# Patient Record
Sex: Male | Born: 1940 | Race: Black or African American | Hispanic: No | Marital: Married | Smoking: Former smoker
Health system: Southern US, Community
[De-identification: ages and names within clinical notes are randomized; demographics above are authoritative.]

## PROBLEM LIST (undated history)

## (undated) ENCOUNTER — Emergency Department (HOSPITAL_COMMUNITY): Disposition: A | Discharge status: Home / Self Care | Payer: Medicare HMO

## (undated) ENCOUNTER — Emergency Department (HOSPITAL_COMMUNITY): Admission: EM | Discharge status: Absent without leave | Payer: Medicare HMO

## (undated) DIAGNOSIS — I10 Essential (primary) hypertension: Secondary | ICD-10-CM

## (undated) DIAGNOSIS — K5792 Diverticulitis of intestine, part unspecified, without perforation or abscess without bleeding: Secondary | ICD-10-CM

## (undated) DIAGNOSIS — J449 Chronic obstructive pulmonary disease, unspecified: Secondary | ICD-10-CM

## (undated) DIAGNOSIS — R296 Repeated falls: Secondary | ICD-10-CM

## (undated) DIAGNOSIS — C801 Malignant (primary) neoplasm, unspecified: Secondary | ICD-10-CM

## (undated) DIAGNOSIS — M1990 Unspecified osteoarthritis, unspecified site: Secondary | ICD-10-CM

## (undated) HISTORY — PX: CERVICAL FUSION: SHX112

## (undated) HISTORY — PX: JOINT REPLACEMENT: SHX530

## (undated) HISTORY — PX: OTHER SURGICAL HISTORY: SHX169

---

## 2004-06-14 ENCOUNTER — Ambulatory Visit (HOSPITAL_COMMUNITY): Admission: RE | Admit: 2004-06-14 | Discharge: 2004-06-14 | Payer: Self-pay | Admitting: Family Medicine

## 2004-06-22 ENCOUNTER — Ambulatory Visit: Payer: Self-pay | Admitting: *Deleted

## 2004-06-22 ENCOUNTER — Ambulatory Visit (HOSPITAL_COMMUNITY): Admission: RE | Admit: 2004-06-22 | Discharge: 2004-06-22 | Payer: Self-pay | Admitting: Family Medicine

## 2004-09-05 ENCOUNTER — Ambulatory Visit (HOSPITAL_COMMUNITY): Admission: RE | Admit: 2004-09-05 | Discharge: 2004-09-05 | Payer: Self-pay | Admitting: Family Medicine

## 2004-09-19 ENCOUNTER — Ambulatory Visit (HOSPITAL_COMMUNITY): Admission: RE | Admit: 2004-09-19 | Discharge: 2004-09-19 | Payer: Self-pay | Admitting: Family Medicine

## 2007-12-25 ENCOUNTER — Inpatient Hospital Stay (HOSPITAL_COMMUNITY): Admission: RE | Admit: 2007-12-25 | Discharge: 2007-12-27 | Payer: Self-pay | Admitting: Neurosurgery

## 2008-05-26 ENCOUNTER — Ambulatory Visit (HOSPITAL_COMMUNITY): Admission: RE | Admit: 2008-05-26 | Discharge: 2008-05-26 | Payer: Self-pay | Admitting: Urology

## 2008-06-03 ENCOUNTER — Ambulatory Visit: Admission: RE | Admit: 2008-06-03 | Discharge: 2008-09-01 | Payer: Self-pay | Admitting: Radiation Oncology

## 2009-10-31 ENCOUNTER — Inpatient Hospital Stay (HOSPITAL_COMMUNITY): Admission: RE | Admit: 2009-10-31 | Discharge: 2009-11-02 | Payer: Self-pay | Admitting: Orthopedic Surgery

## 2010-06-08 LAB — CBC
HCT: 35.1 % — ABNORMAL LOW (ref 39.0–52.0)
MCH: 31.2 pg (ref 26.0–34.0)
MCH: 31.2 pg (ref 26.0–34.0)
MCHC: 33.8 g/dL (ref 30.0–36.0)
MCV: 92.3 fL (ref 78.0–100.0)
MCV: 92.3 fL (ref 78.0–100.0)
Platelets: 189 10*3/uL (ref 150–400)
RBC: 3.81 MIL/uL — ABNORMAL LOW (ref 4.22–5.81)
RDW: 14.6 % (ref 11.5–15.5)

## 2010-06-08 LAB — BASIC METABOLIC PANEL
BUN: 9 mg/dL (ref 6–23)
CO2: 28 mEq/L (ref 19–32)
Calcium: 8.5 mg/dL (ref 8.4–10.5)
Chloride: 104 mEq/L (ref 96–112)
Chloride: 105 mEq/L (ref 96–112)
Glucose, Bld: 123 mg/dL — ABNORMAL HIGH (ref 70–99)
Glucose, Bld: 127 mg/dL — ABNORMAL HIGH (ref 70–99)
Potassium: 3.8 mEq/L (ref 3.5–5.1)
Sodium: 137 mEq/L (ref 135–145)

## 2010-06-08 LAB — TYPE AND SCREEN
ABO/RH(D): B POS
Antibody Screen: NEGATIVE

## 2010-06-09 LAB — DIFFERENTIAL
Eosinophils Absolute: 0.3 10*3/uL (ref 0.0–0.7)
Lymphs Abs: 0.8 10*3/uL (ref 0.7–4.0)
Monocytes Relative: 11 % (ref 3–12)
Neutro Abs: 2.9 10*3/uL (ref 1.7–7.7)
Neutrophils Relative %: 64 % (ref 43–77)

## 2010-06-09 LAB — BASIC METABOLIC PANEL
BUN: 11 mg/dL (ref 6–23)
CO2: 27 mEq/L (ref 19–32)
Calcium: 8.9 mg/dL (ref 8.4–10.5)
Chloride: 108 mEq/L (ref 96–112)
Glucose, Bld: 97 mg/dL (ref 70–99)

## 2010-06-09 LAB — CBC
MCHC: 33.8 g/dL (ref 30.0–36.0)
Platelets: 202 10*3/uL (ref 150–400)
RDW: 14.4 % (ref 11.5–15.5)

## 2010-06-09 LAB — URINALYSIS, ROUTINE W REFLEX MICROSCOPIC
Bilirubin Urine: NEGATIVE
Glucose, UA: NEGATIVE mg/dL
Hgb urine dipstick: NEGATIVE
Urobilinogen, UA: 0.2 mg/dL (ref 0.0–1.0)
pH: 5.5 (ref 5.0–8.0)

## 2010-06-09 LAB — PROTIME-INR: INR: 1.07 (ref 0.00–1.49)

## 2010-06-09 LAB — SURGICAL PCR SCREEN: MRSA, PCR: NEGATIVE

## 2010-08-07 NOTE — Discharge Summary (Signed)
[name], [name]               ACCOUNT NO.:  [id number]   MEDICAL RECORD NO.:  [id number]          PATIENT TYPE:  INP   LOCATION:  3016                         FACILITY:  [facility]   PHYSICIAN:  Coletta Memos, M.D.     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   PROCEDURES:  C4 corpectomy, C3-C5 arthrodesis, cervical decompression  arthrodesis C5-C6, anterior cervical plating C3 through C6.   COMPLICATIONS:  None.   DISCHARGE STATUS:  Alive and well.  Randy Russo is doing quite well  after his operation.  He is swallowing, tolerating a regular diet.  He  has voided and he has walked.  Wound is clean, dry, no signs of  infection.  He will be discharged home with Percocet and Flexeril.  Given instructions, no heavy bending, lifting, or twisting.           ______________________________  Coletta Memos, M.D.     KC/MEDQ  D:  12/27/2007  T:  12/28/2007  Job:  [id number]

## 2010-08-07 NOTE — Op Note (Signed)
Randy Russo NO.:  [id number]   MEDICAL RECORD NO.:  [id number]          PATIENT TYPE:  INP   LOCATION:  3016                         FACILITY:  [facility]   PHYSICIAN:  Danae Orleans. Venetia Maxon, M.D.  DATE OF BIRTH:  1941-01-30   DATE OF PROCEDURE:  12/25/2007  DATE OF DISCHARGE:                               OPERATIVE REPORT   PREOPERATIVE DIAGNOSES:  Herniated cervical disk with cervical stenosis,  spondylosis with myelopathy, and cervical radiculopathy, C3-4, C4-5 and  C5-6 levels.   POSTOPERATIVE DIAGNOSES:  Herniated cervical disk with cervical  stenosis, spondylosis with myelopathy, and cervical radiculopathy, C3-4,  C4-5 and C5-6 levels.   PROCEDURES:  1. Anterior cervical corpectomy, C4.  2. C3 through C5 PEEK cage with morcellized bone autograft.  3. Anterior cervical decompression and fusion C5-6 with autograft, and      allograft.  4. Anterior cervical plating C3 through C6 levels.   SURGEON:  Danae Orleans. Venetia Maxon, MD   ASSISTANT:  Georgiann Cocker, RN   ANESTHESIA:  General endotracheal anesthesia.   ESTIMATED BLOOD LOSS:  Minimal.   COMPLICATIONS:  None.   DISPOSITION:  Recovery.   INDICATIONS:  Randy Russo is a 70 year old man with a severe cervical  spondylitic myelopathy with marked cord compression at C3-4 and C4-5 and  a lesser degree at C5-6 level.  He had cord signal at C3-4 and C4-5 with  disk herniation extending from C3 to the C5 level.  It was elected to  take him to surgery for anterior cervical decompression and fusion at C3  through C6 levels.   PROCEDURE:  Randy Russo was brought to the operating room.  After smooth  and uncomplicated induction of general endotracheal anesthesia, he was  placed in the supine position on the operating table.  His neck was  maintained in neutral alignment.  His anterior neck was then prepped and  draped in usual sterile fashion.  After being placed in 5 pounds Halter  traction his anterior neck, an  incision was made on the left side of  midline after infiltrating the skin with local lidocaine, and carried  through to the platysma layer to expose the anterior border of  sternocleidomastoid muscle.  Using blunt dissection, the carotid sheath  was kept lateral and trachea and esophagus kept medial, and extremely  glabrous neck with quite redundant soft tissues, the C3-4 and C4-5  levels were exposed and final spinal needles were placed at each of  these levels, and the correct level was confirmed on intraoperative x-  ray.  Extremely large anterior osteophyte was removed at C5-6 and with  exposure of the interspace at this level.  Initially a Shadow-Line self-  retaining retractor was placed along with up and down retractors.  The  corpectomy at C4 was performed with a Leksell rongeur and completed with  high-speed drill, and endplates of C3 and C4 were prepared for grafting.  Under the microscope using 1- and 2-mm gold-tipped Kerrison rongeurs,  the posterior longitudinal ligament was removed along with thin  remaining lip of cortical bone to decompress central spinal cord dura.  There was a large amount of herniated disk material at C3-4, which was  inferiorly migrated and also redundant ligamentous tissue, and this was  all decompressed with significant decompression of the spinal cord and  dura.  Hemostasis was assured and after trial sizing, a 24-mm PEEK  interbody cage was selected and packed with morcellized bone autograft,  which was saved from the corpectomy defect and placed within the  interspace and countersunk appropriately.  A C5-6 disk space was further  explored.  Distraction pins were placed at C5 and C6 and under high-  power microscopic visualization, the endplates were decorticated, and  the interspace was identified and then carried to the dura, and the disk  space was extremely degenerated.  The spinal cord dura was decompressed  as were both C6 nerve roots.  After  trial sizing, a 7-mm allograft bone  wedge selected and fashioned with a high-speed drill and morcellized  bone autograft inserted into the interspace and countersunk  appropriately.  After removing remaining ventral osteophytes, a 54-mm  Trestle anterior cervical plate was affixed to the anterior cervical  spine using variable angle screws at C6 and C5 and fixed angle screws at  C3.  All screws had excellent purchase and their locking mechanisms were  engaged.  Final radiograph demonstrated well-positioned interbody grafts  and anterior cervical plating.  Because the patient's extremely large  body habitus, it was only possible to visualize the superior aspect of  the graft and screws.  Traction weight was removed prior to placing the  plate.  Wound was irrigated.  There was still some oozing from soft  tissues.  It was controlled with bipolar electrocautery and because of  the patient's large body size as well as the extensive nature of the  surgery, a #7 JP drain was inserted through a separate stab incision.  The platysma layer was closed with 3-0 Vicryl sutures and skin edges  were approximated with 3-0 Vicryl interrupted inverted sutures.  The  wound was dressed with benzoin, Steri-Strips, Telfa gauze, and tape.  The patient was extubated in the operating room and taken to recovery in  stable and satisfactory condition having tolerated the procedure well.  Counts were correct at the end of the case.      Danae Orleans. Venetia Maxon, M.D.  Electronically Signed     JDS/MEDQ  D:  12/25/2007  T:  12/26/2007  Job:  [id number]

## 2010-08-10 NOTE — Procedures (Signed)
NAME:  Randy Russo, Randy Russo NO.:  [id number]   MEDICAL RECORD NO.:  [id number]          PATIENT TYPE:  OUT   LOCATION:  RAD                           FACILITY:  [facility]   PHYSICIAN:  Vida Roller, M.D.   DATE OF BIRTH:  14-Aug-1940   DATE OF PROCEDURE:  06/22/2004  DATE OF DISCHARGE:                                  ECHOCARDIOGRAM   PRIMARY CARE PHYSICIAN:  Corrie Mckusick, M.D.   TAPE:  [id number].   TAPE COUNT:  550 through 998.   This is a 70 year old man with cardiomegaly.   The technical quality of the study is poor.   M-MODE TRACINGS:  The aorta is 36 mm.   Left atrium is 40 mm.   The septum is 19 mm.   The posterior wall is 14 mm.   Left ventricular diastolic dimension is 38 mm.   Left ventricular systolic dimension is 31 mm.   2D AND DOPPLER IMAGING:  The left ventricle is normal size with normal  systolic function.  The estimated ejection fraction is 55-60%.  There is  moderate concentric left ventricular hypertrophy with some upper septal  hypertrophy but no obvious left ventricular outflow gradient.   ASSESSMENT:  Wall motion is difficult, but there does not appear to be any  substantial regional wall motion abnormalities.   The right ventricle and right atrium were not well seen.  Unfortunately,  little clinical information can be gleaned from this.   There is no pericardial effusion.   There is no aortic stenosis.   There is no mitral regurgitation.      JH/MEDQ  D:  06/22/2004  T:  06/22/2004  Job:  [id number]

## 2010-08-10 NOTE — Procedures (Signed)
NAME:  Randy Russo, Randy Russo NO.:  [id number]   MEDICAL RECORD NO.:  [id number]          PATIENT TYPE:  OUT   LOCATION:                                FACILITY:  [facility]   PHYSICIAN:  Edward L. Juanetta Gosling, M.D.DATE OF BIRTH:  Aug 31, 1940   DATE OF PROCEDURE:  09/26/2004  DATE OF DISCHARGE:                              PULMONARY FUNCTION TEST   RESULTS:  1.  Spirometry shows a moderate ventilatory defect with evidence of airflow      obstruction.  2.  Lung volumes show a mild reduction in total lung capacity suggesting a      separate restrictive change.  3.  DLCO was mildly reduced.  4.  Arterial blood gases are normal.  5.  There is significant bronchodilator improvement.       ELH/MEDQ  D:  09/26/2004  T:  09/26/2004  Job:  [id number]

## 2010-09-13 ENCOUNTER — Encounter (HOSPITAL_COMMUNITY): Payer: Medicare Other

## 2010-09-13 ENCOUNTER — Other Ambulatory Visit: Payer: Self-pay | Admitting: Orthopedic Surgery

## 2010-09-13 ENCOUNTER — Other Ambulatory Visit (HOSPITAL_COMMUNITY): Payer: Self-pay | Admitting: Orthopedic Surgery

## 2010-09-13 ENCOUNTER — Ambulatory Visit (HOSPITAL_COMMUNITY)
Admission: RE | Admit: 2010-09-13 | Discharge: 2010-09-13 | Disposition: A | Discharge status: Home / Self Care | Payer: Medicare Other | Source: Ambulatory Visit | Attending: Orthopedic Surgery | Admitting: Orthopedic Surgery

## 2010-09-13 DIAGNOSIS — M1712 Unilateral primary osteoarthritis, left knee: Secondary | ICD-10-CM

## 2010-09-13 DIAGNOSIS — J4489 Other specified chronic obstructive pulmonary disease: Secondary | ICD-10-CM | POA: Insufficient documentation

## 2010-09-13 DIAGNOSIS — Z01812 Encounter for preprocedural laboratory examination: Secondary | ICD-10-CM | POA: Insufficient documentation

## 2010-09-13 DIAGNOSIS — Z981 Arthrodesis status: Secondary | ICD-10-CM | POA: Insufficient documentation

## 2010-09-13 DIAGNOSIS — M4120 Other idiopathic scoliosis, site unspecified: Secondary | ICD-10-CM | POA: Insufficient documentation

## 2010-09-13 DIAGNOSIS — M1710 Unilateral primary osteoarthritis, unspecified knee: Secondary | ICD-10-CM | POA: Insufficient documentation

## 2010-09-13 DIAGNOSIS — Z01818 Encounter for other preprocedural examination: Secondary | ICD-10-CM | POA: Insufficient documentation

## 2010-09-13 DIAGNOSIS — J449 Chronic obstructive pulmonary disease, unspecified: Secondary | ICD-10-CM | POA: Insufficient documentation

## 2010-09-13 LAB — URINALYSIS, ROUTINE W REFLEX MICROSCOPIC
Glucose, UA: NEGATIVE mg/dL
Leukocytes, UA: NEGATIVE
Protein, ur: NEGATIVE mg/dL
Specific Gravity, Urine: 1.022 (ref 1.005–1.030)
pH: 5.5 (ref 5.0–8.0)

## 2010-09-13 LAB — BASIC METABOLIC PANEL
BUN: 12 mg/dL (ref 6–23)
GFR calc Af Amer: >60 mL/min (ref >60)
GFR calc non Af Amer: >60 mL/min (ref >60)
Potassium: 4.2 mEq/L (ref 3.5–5.1)
Sodium: 138 mEq/L (ref 135–145)

## 2010-09-13 LAB — CBC
MCV: 91.4 fL (ref 78.0–100.0)
Platelets: 217 10*3/uL (ref 150–400)
RBC: 4.55 MIL/uL (ref 4.22–5.81)
RDW: 14 % (ref 11.5–15.5)
WBC: 5.7 10*3/uL (ref 4.0–10.5)

## 2010-09-13 LAB — DIFFERENTIAL
Basophils Absolute: 0 10*3/uL (ref 0.0–0.1)
Lymphocytes Relative: 20 % (ref 12–46)
Neutro Abs: 3.6 10*3/uL (ref 1.7–7.7)
Neutrophils Relative %: 64 % (ref 43–77)

## 2010-09-13 LAB — APTT: aPTT: 35 seconds (ref 24–37)

## 2010-09-13 LAB — PROTIME-INR: INR: 1.04 (ref 0.00–1.49)

## 2010-09-13 LAB — SURGICAL PCR SCREEN
MRSA, PCR: NEGATIVE
Staphylococcus aureus: NEGATIVE

## 2010-09-24 ENCOUNTER — Inpatient Hospital Stay (HOSPITAL_COMMUNITY)
Admission: RE | Admit: 2010-09-24 | Discharge: 2010-09-26 | DRG: 470 | Disposition: A | Discharge status: Home Health | Payer: Medicare Other | Source: Ambulatory Visit | Attending: Orthopedic Surgery | Admitting: Orthopedic Surgery

## 2010-09-24 DIAGNOSIS — Z01812 Encounter for preprocedural laboratory examination: Secondary | ICD-10-CM

## 2010-09-24 DIAGNOSIS — M1710 Unilateral primary osteoarthritis, unspecified knee: Principal | ICD-10-CM | POA: Diagnosis present

## 2010-09-24 DIAGNOSIS — Z981 Arthrodesis status: Secondary | ICD-10-CM

## 2010-09-24 DIAGNOSIS — M21869 Other specified acquired deformities of unspecified lower leg: Secondary | ICD-10-CM | POA: Diagnosis present

## 2010-09-24 DIAGNOSIS — J449 Chronic obstructive pulmonary disease, unspecified: Secondary | ICD-10-CM | POA: Diagnosis present

## 2010-09-24 DIAGNOSIS — Z96649 Presence of unspecified artificial hip joint: Secondary | ICD-10-CM

## 2010-09-24 DIAGNOSIS — J4489 Other specified chronic obstructive pulmonary disease: Secondary | ICD-10-CM | POA: Diagnosis present

## 2010-09-24 LAB — TYPE AND SCREEN: Antibody Screen: NEGATIVE

## 2010-09-25 LAB — BASIC METABOLIC PANEL
CO2: 27 mEq/L (ref 19–32)
GFR calc non Af Amer: >60 mL/min (ref >60)
Glucose, Bld: 145 mg/dL — ABNORMAL HIGH (ref 70–99)
Potassium: 4.1 mEq/L (ref 3.5–5.1)
Sodium: 136 mEq/L (ref 135–145)

## 2010-09-25 LAB — CBC
Hemoglobin: 11.1 g/dL — ABNORMAL LOW (ref 13.0–17.0)
RBC: 3.65 MIL/uL — ABNORMAL LOW (ref 4.22–5.81)

## 2010-09-26 LAB — BASIC METABOLIC PANEL
CO2: 26 mEq/L (ref 19–32)
Calcium: 8.7 mg/dL (ref 8.4–10.5)
Chloride: 102 mEq/L (ref 96–112)
Glucose, Bld: 135 mg/dL — ABNORMAL HIGH (ref 70–99)
Sodium: 135 mEq/L (ref 135–145)

## 2010-09-26 LAB — CBC
Hemoglobin: 9.9 g/dL — ABNORMAL LOW (ref 13.0–17.0)
MCH: 29.8 pg (ref 26.0–34.0)
RBC: 3.32 MIL/uL — ABNORMAL LOW (ref 4.22–5.81)
WBC: 9.2 10*3/uL (ref 4.0–10.5)

## 2010-10-01 NOTE — Op Note (Signed)
NAMEIVEY, CINA NO.:  [id number]  MEDICAL RECORD NO.:  [id number]  LOCATION:  1611                         FACILITY:  [facility]  PHYSICIAN:  Madlyn Frankel. Charlann Boxer, M.D.  DATE OF BIRTH:  1940/06/15  DATE OF PROCEDURE:  09/24/2010 DATE OF DISCHARGE:                              OPERATIVE REPORT   PREOPERATIVE DIAGNOSIS:  Left knee osteoarthritis.  POSTOPERATIVE DIAGNOSIS:  Left knee osteoarthritis.  PROCEDURE:  Left total knee replacement utilizing DePuy component rotating platform posterior stabilized knee system, size 5 femur, 5 tibia, 10-mm insert and 38 patellar button.  SURGEON:  Madlyn Frankel. Charlann Boxer, M.D.  ASSISTANT:  Surgical team.  ANESTHESIA:  Spinal.  SPECIMEN:  None.  COMPLICATIONS:  None.  DRAINS:  One Hemovac.  TOURNIQUET TIME:  49 minutes at 250 mmHg.  INDICATIONS FOR PROCEDURE:  Mr. Mahl is a 70 year old gentleman who had been following for some degenerative changes in bilateral knees.  He has had progressive discomfort in the last 12 years with progressive loss of function.  He has failed conservative measures, at this point is ready to proceed with knee arthroplasty.  Risks and benefits were discussed and reviewed.  Consent was obtained for the benefit of pain relief.  Risks of infection, DVT, component failure, need for revision surgery all discussed and reviewed which include  manipulation as well as neurovascular injury due to his preoperative valgus deformity.  Consent was obtained for benefit of pain relief.  PROCEDURE IN DETAIL:  The patient was brought to operative theater. Once adequate anesthesia, preoperative antibiotics, Ancef administered, the patient was positioned supine and left thigh tourniquet placed.  The left lower extremity was then prepped and draped in sterile fashion. Left foot placed in the [facility] leg holder.  Time-out was performed identifying the patient, planned procedure and extremity.  Leg was then  exsanguinated, tourniquet elevated to 250 mmHg.  Midline incision was made followed by median arthrotomy.  Following initial exposure with a slight proximal medial peel but more focused lateral release off the lateral proximal tibia and exposure obtained.  Attention was now directed to the patella.  Precut measurement was about 25 mm.  I resected down to about 15 mm, used a 38 patellar button to restore height as well as used the cut surface.  Lug holes were drilled a metal shim was placed.  Attention was now directed to femur.  Femoral canal was opened with a drill, irrigated to try to prevent fat emboli.  The patient had preoperative 5 to 7 degrees flexion contracture and 11 mm of bone resected off the distal femur.  The patient have a large cyst in the medial femoral condyle.  Attention was now directed to tibia.  Tibia was subluxated anteriorly. The patient had a very large defect laterally on the tibial plateau.  I chose to resect about 2 mm bone based off this proximal lateral tibia. Using extramedullary guide, it was set up accordingly, it was parallel to the tibial spine.  The tibial resection was carried out and we found the knee was going to come to full extension, stable with medial and lateral ligaments medially with a 10-mm insert.  At this point, I sized  the femur to be a size 5 based on the fact that the tibial cut was perpendicular in coronal plane and we checked and I chose a size 5 rotation block.  The size 5 rotation block was pinned in position, anterior referenced using the C clamp to set rotation.  The 4-in-1 cutting block was then positioned.  The anterior-posterior chamfer cuts were then made without difficulty.  The patient was noted to have very significantly sclerotic lateral bone.  Box was made off the lateral aspect of distal femur.  The tibia was then subluxated anteriorly again, size 5 tibial tray seem to fit best and it was pinned into position,  drilled and keel punched. Trial reduction now carried out with 5 femur, 5 tibia 10-mm insert.  The knee came to near full extension at this point and was stable.  The flexion gap appeared to be relatively balanced.  I did not feel I could upside it to a 12.5 even after releasing some of the lateral proximal tibia further.  The patella tracked through the trochlea without application of pressure.  At this point, all trial components were removed.  I removed posterior osteophytes off the medial and lateral aspects of femur.  I drilled sclerotic bone smooth, penetrating drills were cemented into fixation.  I removed any cystic material and further debrided this area and chose to use just cement.  I did it at depth of about 5 mm.  Final components were opened and cement was mixed.  The final components were then cemented into position and the knee was brought to extension with the 10 mm insert and extruded cement removed.  Once cement had fully cured throughout the knee and remaining cement removed from the knee, I chose a 10 mm insert to match the 5 femur.  The tourniquet had been let down after 49 minutes without significant hemostasis required. Once the cement had fully cured and this final insert, a medium Hemovac drain was placed deep.  The knee was reirrigated with normal saline solution pulse lavage.  Extensor mechanism was then reapproximated using #1 Vicryl with the knee in flexion.  The remainder of the wound was closed with 2-0 Vicryl, running 4-0 Monocryl.  The knee was cleaned, dried and dressed sterilely using Dermabond and Aquacel dressing, drain site dressed separately.  The knee was wrapped in Ace wrap.  He was brought to recovery room in stable condition tolerating the procedure well.     Madlyn Frankel Charlann Boxer, M.D.     MDO/MEDQ  D:  09/24/2010  T:  09/24/2010  Job:  [id number]  Electronically Signed by Durene Romans M.D. on 10/01/2010 09:13:04 AM

## 2010-10-01 NOTE — Discharge Summary (Signed)
Randy Russo, GRIMSLEY NO.:  [id number]  MEDICAL RECORD NO.:  [id number]  LOCATION:  1611                         FACILITY:  [facility]  PHYSICIAN:  Madlyn Frankel. Charlann Boxer, M.D.  DATE OF BIRTH:  02/07/1941  DATE OF ADMISSION:  09/24/2010 DATE OF DISCHARGE:                              DISCHARGE SUMMARY   ADMITTING DIAGNOSIS:  Left knee osteoarthritis.  DISCHARGE DIAGNOSES: 1. Left knee osteoarthritis. 2. Asthma/chronic obstructive pulmonary disease. 3. History of osteoarthritis involving hips and knees. 4. History of chronic bronchitis.  ADMITTING HISTORY:  Randy Russo is a 70 year old gentleman who has been a patient of mine for knee arthritis and hip arthritis.  He has had history of right total hip replacement as well as cervical fusion and he presented with advanced bilateral knee osteoarthritis with significant valgus and flexion deformity of his left knee.  He wishes at this point to proceed with more definitive measures given greater than 10 years of discomfort with it.  After reviewing with him risks and benefits of the procedure, he wished to proceed.  Consent was obtained.  Medical clearance was done.  Preoperative medications prescribed.  HOSPITAL COURSE:  The patient admitted for same-day surgery on September 24, 2010.  He underwent an uncomplicated left total knee replacement.  After routine stay in the recovery room, he moved to the orthopedic ward where he remained for his 2-day hospital stay.  Postoperative day #1, his Hemovac drain and Foley catheter were removed.  He was seen and evaluated by Physical Therapy.  He was noted to have hematocrit of 33 by postop day #1.  By day #2, his hematocrit was 30.  His was noted on postop day #2 at time of dressing changes to have some significant bleeding under his Aquacel dressing.  This is  most likely related to anticoagulation.  I did change his dressing, cleaned his wound and applied Steri-Strips and dry gauze  to this prior to discharge.  He had progressed well with physical therapy and on day #2 was ready to go home.  DISCHARGE INSTRUCTIONS:  The patient is to follow up with me on October 10, 2010.  An appointment is already set up.  He can contact our office at [phone] with any questions.  He is to keep his wound dry until followup.  If he has wound concerns or concern of excessive drainage, he is to contact the office with any questions or concerns.  DISCHARGE MEDICATIONS:  His discharge medications will include his home medications of: 1. Spiriva and albuterol as needed. 2. In addition, he will be on Colace 100 mg p.o. b.i.d. as needed for     constipation while on pain medicine. 3. MiraLax 17 g p.o. daily as needed for constipation while on pain     medicine. 4. Norco 7.5/325 one to two tablets every 4-6 hours as needed for     pain. 5. Robaxin 500 mg every 6 hours as needed for muscle spasm pain. 6. Aspirin 325 mg b.i.d. 7. He can resume his normal dose, to use his b.i.d. dosing for 30     days. 8. Iron 325 mg 2 tablets a day for 1 week.  Questions were encouraged and reviewed at time of discharge.  He will be seen back in followup.     Madlyn Frankel Charlann Boxer, M.D.     MDO/MEDQ  D:  09/26/2010  T:  09/26/2010  Job:  [id number]  Electronically Signed by Durene Romans M.D. on 10/01/2010 09:13:09 AM

## 2010-12-24 LAB — BASIC METABOLIC PANEL
Calcium: 9.3
Creatinine, Ser: 0.92
GFR calc Af Amer: >60
GFR calc non Af Amer: >60
Sodium: 140

## 2010-12-24 LAB — CBC
Hemoglobin: 13.7
RBC: 4.6
WBC: 7.6

## 2012-10-04 ENCOUNTER — Other Ambulatory Visit: Payer: Self-pay | Admitting: Oncology

## 2012-10-09 ENCOUNTER — Encounter (HOSPITAL_COMMUNITY): Payer: Self-pay | Admitting: Pharmacy Technician

## 2012-10-13 ENCOUNTER — Encounter (HOSPITAL_COMMUNITY): Admission: RE | Admit: 2012-10-13 | Payer: Medicare Other | Source: Ambulatory Visit

## 2012-10-26 ENCOUNTER — Encounter (HOSPITAL_COMMUNITY): Payer: Self-pay | Admitting: Pharmacy Technician

## 2012-11-09 ENCOUNTER — Encounter (HOSPITAL_COMMUNITY)
Admission: RE | Admit: 2012-11-09 | Discharge: 2012-11-09 | Disposition: A | Discharge status: Home / Self Care | Payer: Medicare Other | Source: Ambulatory Visit | Attending: Ophthalmology | Admitting: Ophthalmology

## 2012-11-09 ENCOUNTER — Other Ambulatory Visit: Payer: Self-pay

## 2012-11-09 ENCOUNTER — Encounter (HOSPITAL_COMMUNITY): Payer: Self-pay

## 2012-11-09 DIAGNOSIS — Z01810 Encounter for preprocedural cardiovascular examination: Secondary | ICD-10-CM | POA: Insufficient documentation

## 2012-11-09 DIAGNOSIS — Z01812 Encounter for preprocedural laboratory examination: Secondary | ICD-10-CM | POA: Insufficient documentation

## 2012-11-09 HISTORY — DX: Unspecified osteoarthritis, unspecified site: M19.90

## 2012-11-09 HISTORY — DX: Chronic obstructive pulmonary disease, unspecified: J44.9

## 2012-11-09 LAB — BASIC METABOLIC PANEL
CO2: 28 mEq/L (ref 19–32)
Chloride: 105 mEq/L (ref 96–112)
Creatinine, Ser: 0.95 mg/dL (ref 0.50–1.35)
GFR calc Af Amer: >90 mL/min (ref >90)
Potassium: 4.3 mEq/L (ref 3.5–5.1)
Sodium: 140 mEq/L (ref 135–145)

## 2012-11-09 LAB — HEMOGLOBIN AND HEMATOCRIT, BLOOD
HCT: 38.5 % — ABNORMAL LOW (ref 39.0–52.0)
Hemoglobin: 12.8 g/dL — ABNORMAL LOW (ref 13.0–17.0)

## 2012-11-09 NOTE — Progress Notes (Signed)
11/09/12 1106  OBSTRUCTIVE SLEEP APNEA  Have you ever been diagnosed with sleep apnea through a sleep study? No  Do you snore loudly (loud enough to be heard through closed doors)?  0  Do you often feel tired, fatigued, or sleepy during the daytime? 0  Has anyone observed you stop breathing during your sleep? 0  Do you have, or are you being treated for high blood pressure? 0  BMI more than 35 kg/m2? 1  Age over 72 years old? 1  Neck circumference greater than 40 cm/18 inches? 1  Gender: 1  Obstructive Sleep Apnea Score 4  Score 4 or greater  Results sent to PCP

## 2012-11-09 NOTE — Patient Instructions (Addendum)
Randy Russo  11/09/2012   Your procedure is scheduled on:  11/16/12  Report to [facility] at [id number].  Call this number if you have problems the morning of surgery: [phone]   Remember:   Do not eat food or drink liquids after midnight.   Take these medicines the morning of surgery with A SIP OF WATER: albuterol, spiriva   Do not wear jewelry, make-up or nail polish.  Do not wear lotions, powders, or perfumes. You may wear deodorant.  Do not shave 48 hours prior to surgery. Men may shave face and neck.  Do not bring valuables to the hospital.  [facility] is not responsible                   for any belongings or valuables.  Contacts, dentures or bridgework may not be worn into surgery.  Leave suitcase in the car. After surgery it may be brought to your room.  For patients admitted to the hospital, checkout time is 11:00 AM the day of  discharge.   Patients discharged the day of surgery will not be allowed to drive  home.  Name and phone number of your driver: family  Special Instructions: N/A   Please read over the following fact sheets that you were given: Anesthesia Post-op Instructions and Care and Recovery After Surgery   PATIENT INSTRUCTIONS POST-ANESTHESIA  IMMEDIATELY FOLLOWING SURGERY:  Do not drive or operate machinery for the first twenty four hours after surgery.  Do not make any important decisions for twenty four hours after surgery or while taking narcotic pain medications or sedatives.  If you develop intractable nausea and vomiting or a severe headache please notify your doctor immediately.  FOLLOW-UP:  Please make an appointment with your surgeon as instructed. You do not need to follow up with anesthesia unless specifically instructed to do so.  WOUND CARE INSTRUCTIONS (if applicable):  Keep a dry clean dressing on the anesthesia/puncture wound site if there is drainage.  Once the wound has quit draining you may leave it open to air.  Generally you should leave  the bandage intact for twenty four hours unless there is drainage.  If the epidural site drains for more than 36-48 hours please call the anesthesia department.  QUESTIONS?:  Please feel free to call your physician or the hospital operator if you have any questions, and they will be happy to assist you.      Cataract Surgery  A cataract is a clouding of the lens of the eye. When a lens becomes cloudy, vision is reduced based on the degree and nature of the clouding. Surgery may be needed to improve vision. Surgery removes the cloudy lens and usually replaces it with a substitute lens (intraocular lens, IOL). LET YOUR EYE DOCTOR KNOW ABOUT:  Allergies to food or medicine.  Medicines taken including herbs, eyedrops, over-the-counter medicines, and creams.  Use of steroids (by mouth or creams).  Previous problems with anesthetics or numbing medicine.  History of bleeding problems or blood clots.  Previous surgery.  Other health problems, including diabetes and kidney problems.  Possibility of pregnancy, if this applies. RISKS AND COMPLICATIONS  Infection.  Inflammation of the eyeball (endophthalmitis) that can spread to both eyes (sympathetic ophthalmia).  Poor wound healing.  If an IOL is inserted, it can later fall out of proper position. This is very uncommon.  Clouding of the part of your eye that holds an IOL in place. This is called an "after-cataract."  These are uncommon, but easily treated. BEFORE THE PROCEDURE  Do not eat or drink anything except small amounts of water for 8 to 12 before your surgery, or as directed by your caregiver.  Unless you are told otherwise, continue any eyedrops you have been prescribed.  Talk to your primary caregiver about all other medicines that you take (both prescription and non-prescription). In some cases, you may need to stop or change medicines near the time of your surgery. This is most important if you are taking blood-thinning  medicine.Do not stop medicines unless you are told to do so.  Arrange for someone to drive you to and from the procedure.  Do not put contact lenses in either eye on the day of your surgery. PROCEDURE There is more than one method for safely removing a cataract. Your doctor can explain the differences and help determine which is best for you. Phacoemulsification surgery is the most common form of cataract surgery.  An injection is given behind the eye or eyedrops are given to make this a painless procedure.  A small cut (incision) is made on the edge of the clear, dome-shaped surface that covers the front of the eye (cornea).  A tiny probe is painlessly inserted into the eye. This device gives off ultrasound waves that soften and break up the cloudy center of the lens. This makes it easier for the cloudy lens to be removed by suction.  An IOL may be implanted.  The normal lens of the eye is covered by a clear capsule. Part of that capsule is intentionally left in the eye to support the IOL.  Your surgeon may or may not use stitches to close the incision. There are other forms of cataract surgery that require a larger incision and stiches to close the eye. This approach is taken in cases where the doctor feels that the cataract cannot be easily removed using phacoemulsification. AFTER THE PROCEDURE  When an IOL is implanted, it does not need care. It becomes a permanent part of your eye and cannot be seen or felt.  Your doctor will schedule follow-up exams to check on your progress.  Review your other medicines with your doctor to see which can be resumed after surgery.  Use eyedrops or take medicine as prescribed by your doctor. Document Released: 02/28/2011 Document Revised: 06/03/2011 Document Reviewed: 02/28/2011 [facility] Patient Information 2014 [place], [place].

## 2012-11-16 ENCOUNTER — Ambulatory Visit (HOSPITAL_COMMUNITY): Payer: Medicare Other | Admitting: Anesthesiology

## 2012-11-16 ENCOUNTER — Encounter (HOSPITAL_COMMUNITY): Payer: Self-pay | Admitting: *Deleted

## 2012-11-16 ENCOUNTER — Ambulatory Visit (HOSPITAL_COMMUNITY)
Admission: RE | Admit: 2012-11-16 | Discharge: 2012-11-16 | Disposition: A | Discharge status: Home / Self Care | Payer: Medicare Other | Source: Ambulatory Visit | Attending: Ophthalmology | Admitting: Ophthalmology

## 2012-11-16 ENCOUNTER — Encounter (HOSPITAL_COMMUNITY)
Admission: RE | Disposition: A | Discharge status: Home / Self Care | Payer: Self-pay | Source: Ambulatory Visit | Attending: Ophthalmology

## 2012-11-16 ENCOUNTER — Encounter (HOSPITAL_COMMUNITY): Payer: Self-pay | Admitting: Anesthesiology

## 2012-11-16 DIAGNOSIS — H2510 Age-related nuclear cataract, unspecified eye: Secondary | ICD-10-CM | POA: Insufficient documentation

## 2012-11-16 DIAGNOSIS — J4489 Other specified chronic obstructive pulmonary disease: Secondary | ICD-10-CM | POA: Insufficient documentation

## 2012-11-16 DIAGNOSIS — J449 Chronic obstructive pulmonary disease, unspecified: Secondary | ICD-10-CM | POA: Insufficient documentation

## 2012-11-16 HISTORY — PX: CATARACT EXTRACTION W/PHACO: SHX586

## 2012-11-16 SURGERY — PHACOEMULSIFICATION, CATARACT, WITH IOL INSERTION
Anesthesia: Monitor Anesthesia Care | Site: Eye | Laterality: Left | Wound class: Clean

## 2012-11-16 MED ORDER — CYCLOPENTOLATE-PHENYLEPHRINE 0.2-1 % OP SOLN
1.0000 [drp] | OPHTHALMIC | Status: AC
  Administered 2012-11-16 (×3): 1 [drp] via OPHTHALMIC

## 2012-11-16 MED ORDER — EPINEPHRINE HCL 1 MG/ML IJ SOLN
INTRAOCULAR | Status: DC | PRN
  Administered 2012-11-16: 11:00:00

## 2012-11-16 MED ORDER — MIDAZOLAM HCL 2 MG/2ML IJ SOLN
INTRAMUSCULAR | Status: AC
  Filled 2012-11-16: qty 2

## 2012-11-16 MED ORDER — LIDOCAINE HCL (PF) 1 % IJ SOLN
INTRAMUSCULAR | Status: DC | PRN
  Administered 2012-11-16: .5 mL

## 2012-11-16 MED ORDER — PHENYLEPHRINE HCL 2.5 % OP SOLN
1.0000 [drp] | OPHTHALMIC | Status: AC
  Administered 2012-11-16 (×3): 1 [drp] via OPHTHALMIC

## 2012-11-16 MED ORDER — NEOMYCIN-POLYMYXIN-DEXAMETH 0.1 % OP OINT
TOPICAL_OINTMENT | OPHTHALMIC | Status: DC | PRN
  Administered 2012-11-16: 1 via OPHTHALMIC

## 2012-11-16 MED ORDER — LIDOCAINE HCL 3.5 % OP GEL
1.0000 "application " | Freq: Once | OPHTHALMIC | Status: AC
  Administered 2012-11-16: 1 via OPHTHALMIC

## 2012-11-16 MED ORDER — LACTATED RINGERS IV SOLN
INTRAVENOUS | Status: DC | PRN
  Administered 2012-11-16: 10:00:00 via INTRAVENOUS

## 2012-11-16 MED ORDER — EPINEPHRINE HCL 1 MG/ML IJ SOLN
INTRAMUSCULAR | Status: AC
  Filled 2012-11-16: qty 1

## 2012-11-16 MED ORDER — PROVISC 10 MG/ML IO SOLN
INTRAOCULAR | Status: DC | PRN
  Administered 2012-11-16: 8.5 mg via INTRAOCULAR

## 2012-11-16 MED ORDER — LACTATED RINGERS IV SOLN
INTRAVENOUS | Status: DC
  Administered 2012-11-16: 11:00:00 via INTRAVENOUS

## 2012-11-16 MED ORDER — MIDAZOLAM HCL 2 MG/2ML IJ SOLN
1.0000 mg | INTRAMUSCULAR | Status: DC | PRN
  Administered 2012-11-16: 2 mg via INTRAVENOUS

## 2012-11-16 MED ORDER — TETRACAINE HCL 0.5 % OP SOLN
1.0000 [drp] | OPHTHALMIC | Status: AC
  Administered 2012-11-16 (×3): 1 [drp] via OPHTHALMIC

## 2012-11-16 MED ORDER — BSS IO SOLN
INTRAOCULAR | Status: DC | PRN
  Administered 2012-11-16: 15 mL via INTRAOCULAR

## 2012-11-16 MED ORDER — POVIDONE-IODINE 5 % OP SOLN
OPHTHALMIC | Status: DC | PRN
  Administered 2012-11-16: 1 via OPHTHALMIC

## 2012-11-16 SURGICAL SUPPLY — 32 items

## 2012-11-16 NOTE — Op Note (Signed)
Date of Admission: 11/16/2012  Date of Surgery: 11/16/2012  Pre-Op Dx: Cataract  Left  Eye  Post-Op Dx: Cataract  Left  Eye,  Dx Code 366.16  Surgeon: Gemma Payor, M.D.  Assistants: None  Anesthesia: Topical with MAC  Indications: Painless, progressive loss of vision with compromise of daily activities.  Surgery: Cataract Extraction with Intraocular lens Implant Left Eye  Discription: The patient had dilating drops and viscous lidocaine placed into the left eye in the pre-op holding area. After transfer to the operating room, a time out was performed. The patient was then prepped and draped. Beginning with a 75 degree blade a paracentesis port was made at the surgeon's 2 o'clock position. The anterior chamber was then filled with 1% non-preserved lidocaine. This was followed by filling the anterior chamber with Provisc. A bent cystatome needle was used to create a continuous tear capsulotomy. Hydrodissection was performed with balanced salt solution on a Fine canula. The lens nucleus was then removed using the phacoemulsification handpiece. Residual cortex was removed with the I&A handpiece. The anterior chamber and capsular bag were refilled with Provisc. A posterior chamber intraocular lens was placed into the capsular bag with it's injector. The implant was positioned with the Kuglan hook. The Provisc was then removed from the anterior chamber and capsular bag with the I&A handpiece. Stromal hydration of the main incision and paracentesis port was performed with BSS on a Fine canula. The wounds were tested for leak which was negative. The patient tolerated the procedure well. There were no operative complications. The patient was then transferred to the recovery room in stable condition.  Complications: None  Specimen: None  EBL: None  Prosthetic device: B&L enVista, MX60, power 22.0D, SN [id number].

## 2012-11-16 NOTE — Preoperative (Signed)
Beta Blockers   Reason not to administer Beta Blockers:Not Applicable 

## 2012-11-16 NOTE — Transfer of Care (Signed)
Immediate Anesthesia Transfer of Care Note  Patient: Randy Russo  Procedure(s) Performed: Procedure(s) with comments: CATARACT EXTRACTION PHACO AND INTRAOCULAR LENS PLACEMENT (IOC) (Left) - CDE: 13.66  Patient Location: Short Stay  Anesthesia Type:MAC  Level of Consciousness: awake, alert , oriented and patient cooperative  Airway & Oxygen Therapy: Patient Spontanous Breathing  Post-op Assessment: Report given to PACU RN and Post -op Vital signs reviewed and stable  Post vital signs: Reviewed and stable  Complications: No apparent anesthesia complications

## 2012-11-16 NOTE — Anesthesia Postprocedure Evaluation (Signed)
  Anesthesia Post-op Note  Patient: Randy Russo  Procedure(s) Performed: Procedure(s) with comments: CATARACT EXTRACTION PHACO AND INTRAOCULAR LENS PLACEMENT (IOC) (Left) - CDE: 13.66  Patient Location: Short Stay  Anesthesia Type:MAC  Level of Consciousness: awake, alert , oriented and patient cooperative  Airway and Oxygen Therapy: Patient Spontanous Breathing  Post-op Pain: none  Post-op Assessment: Post-op Vital signs reviewed, Patient's Cardiovascular Status Stable, Respiratory Function Stable, Patent Airway, No signs of Nausea or vomiting and Pain level controlled  Post-op Vital Signs: Reviewed and stable  Complications: No apparent anesthesia complications

## 2012-11-16 NOTE — H&P (Signed)
I have reviewed the H&P, the patient was re-examined, and I have identified no interval changes in medical condition and plan of care since the history and physical of record  

## 2012-11-16 NOTE — Anesthesia Preprocedure Evaluation (Signed)
Anesthesia Evaluation  Patient identified by MRN, date of birth, ID band Patient awake    Reviewed: Allergy & Precautions, H&P , NPO status , Patient's Chart, lab work & pertinent test results  Airway Mallampati: II TM Distance: >3 FB     Dental  (+) Edentulous Upper and Edentulous Lower   Pulmonary COPDCurrent Smoker,  breath sounds clear to auscultation        Cardiovascular negative cardio ROS  Rhythm:Regular Rate:Normal     Neuro/Psych    GI/Hepatic   Endo/Other    Renal/GU      Musculoskeletal   Abdominal   Peds  Hematology   Anesthesia Other Findings   Reproductive/Obstetrics                           Anesthesia Physical Anesthesia Plan  ASA: II  Anesthesia Plan: MAC   Post-op Pain Management:    Induction: Intravenous  Airway Management Planned: Nasal Cannula  Additional Equipment:   Intra-op Plan:   Post-operative Plan:   Informed Consent: I have reviewed the patients History and Physical, chart, labs and discussed the procedure including the risks, benefits and alternatives for the proposed anesthesia with the patient or authorized representative who has indicated his/her understanding and acceptance.     Plan Discussed with:   Anesthesia Plan Comments:         Anesthesia Quick Evaluation

## 2012-11-18 ENCOUNTER — Encounter (HOSPITAL_COMMUNITY): Payer: Self-pay | Admitting: Ophthalmology

## 2013-01-13 ENCOUNTER — Encounter (HOSPITAL_COMMUNITY)
Admission: RE | Admit: 2013-01-13 | Discharge: 2013-01-13 | Disposition: A | Discharge status: Home / Self Care | Payer: Medicare Other | Source: Ambulatory Visit | Attending: Ophthalmology | Admitting: Ophthalmology

## 2013-01-13 ENCOUNTER — Encounter (HOSPITAL_COMMUNITY): Payer: Self-pay | Admitting: Pharmacy Technician

## 2013-01-13 MED ORDER — ONDANSETRON HCL 4 MG/2ML IJ SOLN: 4.0000 mg | Freq: Once | INTRAMUSCULAR | Status: AC | PRN

## 2013-01-13 MED ORDER — FENTANYL CITRATE 0.05 MG/ML IJ SOLN: 25.0000 ug | INTRAMUSCULAR | Status: DC | PRN

## 2013-01-13 NOTE — Patient Instructions (Signed)
Your procedure is scheduled on: 01/18/2013  Report to [facility] at 12 noon    Call this number if you have problems the morning of surgery: [phone]   Do not eat food or drink liquids :After Midnight.      Take these medicines the morning of surgery ... proventil and spiriva inhalers.   Do not wear jewelry, make-up or nail polish.  Do not wear lotions, powders, or perfumes.   Do not shave 48 hours prior to surgery.  Do not bring valuables to the hospital.  Contacts, dentures or bridgework may not be worn into surgery.  Leave suitcase in the car. After surgery it may be brought to your room.  For patients admitted to the hospital, checkout time is 11:00 AM the day of discharge.   Patients discharged the day of surgery will not be allowed to drive home.  :     Please read over the following fact sheets that you were given: Coughing and Deep Breathing, Surgical Site Infection Prevention, Anesthesia Post-op Instructions and Care and Recovery After Surgery    Cataract A cataract is a clouding of the lens of the eye. When a lens becomes cloudy, vision is reduced based on the degree and nature of the clouding. Many cataracts reduce vision to some degree. Some cataracts make people more near-sighted as they develop. Other cataracts increase glare. Cataracts that are ignored and become worse can sometimes look white. The white color can be seen through the pupil. CAUSES   Aging. However, cataracts may occur at any age, even in newborns.   Certain drugs.   Trauma to the eye.   Certain diseases such as diabetes.   Specific eye diseases such as chronic inflammation inside the eye or a sudden attack of a rare form of glaucoma.   Inherited or acquired medical problems.  SYMPTOMS   Gradual, progressive drop in vision in the affected eye.   Severe, rapid visual loss. This most often happens when trauma is the cause.  DIAGNOSIS  To detect a cataract, an eye doctor examines the lens.  Cataracts are best diagnosed with an exam of the eyes with the pupils enlarged (dilated) by drops.  TREATMENT  For an early cataract, vision may improve by using different eyeglasses or stronger lighting. If that does not help your vision, surgery is the only effective treatment. A cataract needs to be surgically removed when vision loss interferes with your everyday activities, such as driving, reading, or watching TV. A cataract may also have to be removed if it prevents examination or treatment of another eye problem. Surgery removes the cloudy lens and usually replaces it with a substitute lens (intraocular lens, IOL).  At a time when both you and your doctor agree, the cataract will be surgically removed. If you have cataracts in both eyes, only one is usually removed at a time. This allows the operated eye to heal and be out of danger from any possible problems after surgery (such as infection or poor wound healing). In rare cases, a cataract may be doing damage to your eye. In these cases, your caregiver may advise surgical removal right away. The vast majority of people who have cataract surgery have better vision afterward. HOME CARE INSTRUCTIONS  If you are not planning surgery, you may be asked to do the following:  Use different eyeglasses.   Use stronger or brighter lighting.   Ask your eye doctor about reducing your medicine dose or changing medicines if it  is thought that a medicine caused your cataract. Changing medicines does not make the cataract go away on its own.   Become familiar with your surroundings. Poor vision can lead to injury. Avoid bumping into things on the affected side. You are at a higher risk for tripping or falling.   Exercise extreme care when driving or operating machinery.   Wear sunglasses if you are sensitive to bright light or experiencing problems with glare.  SEEK IMMEDIATE MEDICAL CARE IF:   You have a worsening or sudden vision loss.   You notice  redness, swelling, or increasing pain in the eye.   You have a fever.  Document Released: 03/11/2005 Document Revised: 02/28/2011 Document Reviewed: 11/02/2010 [facility] Patient Information 2012 [place].PATIENT INSTRUCTIONS POST-ANESTHESIA  IMMEDIATELY FOLLOWING SURGERY:  Do not drive or operate machinery for the first twenty four hours after surgery.  Do not make any important decisions for twenty four hours after surgery or while taking narcotic pain medications or sedatives.  If you develop intractable nausea and vomiting or a severe headache please notify your doctor immediately.  FOLLOW-UP:  Please make an appointment with your surgeon as instructed. You do not need to follow up with anesthesia unless specifically instructed to do so.  WOUND CARE INSTRUCTIONS (if applicable):  Keep a dry clean dressing on the anesthesia/puncture wound site if there is drainage.  Once the wound has quit draining you may leave it open to air.  Generally you should leave the bandage intact for twenty four hours unless there is drainage.  If the epidural site drains for more than 36-48 hours please call the anesthesia department.  QUESTIONS?:  Please feel free to call your physician or the hospital operator if you have any questions, and they will be happy to assist you.

## 2013-01-15 MED ORDER — TETRACAINE HCL 0.5 % OP SOLN
OPHTHALMIC | Status: AC
  Filled 2013-01-15: qty 2

## 2013-01-15 MED ORDER — CYCLOPENTOLATE-PHENYLEPHRINE OP SOLN OPTIME - NO CHARGE
OPHTHALMIC | Status: AC
  Filled 2013-01-15: qty 2

## 2013-01-15 MED ORDER — NEOMYCIN-POLYMYXIN-DEXAMETH 3.5-10000-0.1 OP SUSP
OPHTHALMIC | Status: AC
  Filled 2013-01-15: qty 5

## 2013-01-15 MED ORDER — PHENYLEPHRINE HCL 2.5 % OP SOLN
OPHTHALMIC | Status: AC
  Filled 2013-01-15: qty 15

## 2013-01-15 MED ORDER — LIDOCAINE HCL (PF) 1 % IJ SOLN
INTRAMUSCULAR | Status: AC
  Filled 2013-01-15: qty 2

## 2013-01-18 ENCOUNTER — Encounter (HOSPITAL_COMMUNITY): Payer: Medicare Other | Admitting: Anesthesiology

## 2013-01-18 ENCOUNTER — Ambulatory Visit (HOSPITAL_COMMUNITY)
Admission: RE | Admit: 2013-01-18 | Discharge: 2013-01-18 | Disposition: A | Discharge status: Home / Self Care | Payer: Medicare Other | Source: Ambulatory Visit | Attending: Ophthalmology | Admitting: Ophthalmology

## 2013-01-18 ENCOUNTER — Encounter (HOSPITAL_COMMUNITY): Payer: Self-pay | Admitting: *Deleted

## 2013-01-18 ENCOUNTER — Ambulatory Visit (HOSPITAL_COMMUNITY): Payer: Medicare Other | Admitting: Anesthesiology

## 2013-01-18 ENCOUNTER — Encounter (HOSPITAL_COMMUNITY)
Admission: RE | Disposition: A | Discharge status: Home / Self Care | Payer: Self-pay | Source: Ambulatory Visit | Attending: Ophthalmology

## 2013-01-18 DIAGNOSIS — J4489 Other specified chronic obstructive pulmonary disease: Secondary | ICD-10-CM | POA: Insufficient documentation

## 2013-01-18 DIAGNOSIS — H2510 Age-related nuclear cataract, unspecified eye: Secondary | ICD-10-CM | POA: Insufficient documentation

## 2013-01-18 DIAGNOSIS — J449 Chronic obstructive pulmonary disease, unspecified: Secondary | ICD-10-CM | POA: Insufficient documentation

## 2013-01-18 HISTORY — PX: CATARACT EXTRACTION W/PHACO: SHX586

## 2013-01-18 SURGERY — PHACOEMULSIFICATION, CATARACT, WITH IOL INSERTION
Anesthesia: Monitor Anesthesia Care | Site: Eye | Laterality: Right | Wound class: Clean

## 2013-01-18 MED ORDER — LACTATED RINGERS IV SOLN
INTRAVENOUS | Status: DC
  Administered 2013-01-18: 1000 mL via INTRAVENOUS

## 2013-01-18 MED ORDER — FENTANYL CITRATE 0.05 MG/ML IJ SOLN
25.0000 ug | INTRAMUSCULAR | Status: AC
Start: 2013-01-18 — End: 2013-01-18
  Administered 2013-01-18: 25 ug via INTRAVENOUS

## 2013-01-18 MED ORDER — TETRACAINE HCL 0.5 % OP SOLN
1.0000 [drp] | OPHTHALMIC | Status: AC
  Administered 2013-01-18 (×3): 1 [drp] via OPHTHALMIC

## 2013-01-18 MED ORDER — POVIDONE-IODINE 5 % OP SOLN
OPHTHALMIC | Status: DC | PRN
  Administered 2013-01-18: 1 via OPHTHALMIC

## 2013-01-18 MED ORDER — LIDOCAINE HCL 3.5 % OP GEL
1.0000 "application " | Freq: Once | OPHTHALMIC | Status: AC
  Administered 2013-01-18: 1 via OPHTHALMIC

## 2013-01-18 MED ORDER — PROVISC 10 MG/ML IO SOLN
INTRAOCULAR | Status: DC | PRN
  Administered 2013-01-18: 8.5 mg via INTRAOCULAR

## 2013-01-18 MED ORDER — FENTANYL CITRATE 0.05 MG/ML IJ SOLN: 25.0000 ug | INTRAMUSCULAR | Status: DC | PRN

## 2013-01-18 MED ORDER — FENTANYL CITRATE 0.05 MG/ML IJ SOLN
INTRAMUSCULAR | Status: AC
  Filled 2013-01-18: qty 2

## 2013-01-18 MED ORDER — NEOMYCIN-POLYMYXIN-DEXAMETH 3.5-10000-0.1 OP SUSP
OPHTHALMIC | Status: DC | PRN
  Administered 2013-01-18: 1 [drp] via OPHTHALMIC

## 2013-01-18 MED ORDER — MIDAZOLAM HCL 5 MG/5ML IJ SOLN
INTRAMUSCULAR | Status: AC
  Filled 2013-01-18: qty 5

## 2013-01-18 MED ORDER — EPINEPHRINE HCL 1 MG/ML IJ SOLN
INTRAOCULAR | Status: DC | PRN
  Administered 2013-01-18: 13:00:00

## 2013-01-18 MED ORDER — CYCLOPENTOLATE-PHENYLEPHRINE 0.2-1 % OP SOLN
1.0000 [drp] | OPHTHALMIC | Status: AC
  Administered 2013-01-18 (×3): 1 [drp] via OPHTHALMIC

## 2013-01-18 MED ORDER — EPINEPHRINE HCL 1 MG/ML IJ SOLN
INTRAMUSCULAR | Status: AC
  Filled 2013-01-18: qty 1

## 2013-01-18 MED ORDER — ONDANSETRON HCL 4 MG/2ML IJ SOLN: 4.0000 mg | Freq: Once | INTRAMUSCULAR | Status: DC | PRN

## 2013-01-18 MED ORDER — MIDAZOLAM HCL 2 MG/2ML IJ SOLN
1.0000 mg | INTRAMUSCULAR | Status: DC | PRN
  Administered 2013-01-18: 2 mg via INTRAVENOUS

## 2013-01-18 MED ORDER — LIDOCAINE HCL (PF) 1 % IJ SOLN
INTRAOCULAR | Status: DC | PRN
  Administered 2013-01-18: 13:00:00 via OPHTHALMIC

## 2013-01-18 MED ORDER — PHENYLEPHRINE HCL 2.5 % OP SOLN
1.0000 [drp] | OPHTHALMIC | Status: AC
  Administered 2013-01-18 (×3): 1 [drp] via OPHTHALMIC

## 2013-01-18 MED ORDER — BSS IO SOLN
INTRAOCULAR | Status: DC | PRN
  Administered 2013-01-18: 15 mL via INTRAOCULAR

## 2013-01-18 SURGICAL SUPPLY — 32 items
CAPSULAR TENSION RING-AMO (OPHTHALMIC RELATED) IMPLANT
CLOTH BEACON ORANGE TIMEOUT ST (SAFETY) ×2 IMPLANT
EYE SHIELD UNIVERSAL CLEAR (GAUZE/BANDAGES/DRESSINGS) ×2 IMPLANT
GLOVE BIO SURGEON STRL SZ 6.5 (GLOVE) IMPLANT
GLOVE BIOGEL PI IND STRL 6.5 (GLOVE) IMPLANT
GLOVE BIOGEL PI IND STRL 7.0 (GLOVE) ×1 IMPLANT
GLOVE BIOGEL PI IND STRL 7.5 (GLOVE) IMPLANT
GLOVE BIOGEL PI INDICATOR 6.5 (GLOVE)
GLOVE BIOGEL PI INDICATOR 7.0 (GLOVE) ×1
GLOVE BIOGEL PI INDICATOR 7.5 (GLOVE)
GLOVE ECLIPSE 6.5 STRL STRAW (GLOVE) IMPLANT
GLOVE ECLIPSE 7.0 STRL STRAW (GLOVE) IMPLANT
GLOVE ECLIPSE 7.5 STRL STRAW (GLOVE) IMPLANT
GLOVE EXAM NITRILE LRG STRL (GLOVE) IMPLANT
GLOVE EXAM NITRILE MD LF STRL (GLOVE) ×2 IMPLANT
GLOVE SKINSENSE NS SZ6.5 (GLOVE)
GLOVE SKINSENSE NS SZ7.0 (GLOVE)
GLOVE SKINSENSE STRL SZ6.5 (GLOVE) IMPLANT
GLOVE SKINSENSE STRL SZ7.0 (GLOVE) IMPLANT
KIT VITRECTOMY (OPHTHALMIC RELATED) IMPLANT
PAD ARMBOARD 7.5X6 YLW CONV (MISCELLANEOUS) ×2 IMPLANT
PROC W NO LENS (INTRAOCULAR LENS)
PROC W SPEC LENS (INTRAOCULAR LENS)
PROCESS W NO LENS (INTRAOCULAR LENS) IMPLANT
PROCESS W SPEC LENS (INTRAOCULAR LENS) IMPLANT
RING MALYGIN (MISCELLANEOUS) IMPLANT
SIGHTPATH CAT PROC W REG LENS (Ophthalmic Related) ×2 IMPLANT
SYR TB 1ML LL NO SAFETY (SYRINGE) ×2 IMPLANT
TAPE SURG TRANSPORE 1 IN (GAUZE/BANDAGES/DRESSINGS) ×1 IMPLANT
TAPE SURGICAL TRANSPORE 1 IN (GAUZE/BANDAGES/DRESSINGS) ×1
VISCOELASTIC ADDITIONAL (OPHTHALMIC RELATED) IMPLANT
WATER STERILE IRR 250ML POUR (IV SOLUTION) ×2 IMPLANT

## 2013-01-18 NOTE — Anesthesia Preprocedure Evaluation (Signed)
Anesthesia Evaluation  Patient identified by MRN, date of birth, ID band Patient awake    Reviewed: Allergy & Precautions, H&P , NPO status , Patient's Chart, lab work & pertinent test results  Airway Mallampati: II TM Distance: >3 FB     Dental  (+) Edentulous Upper and Edentulous Lower   Pulmonary COPDCurrent Smoker,  breath sounds clear to auscultation        Cardiovascular negative cardio ROS  Rhythm:Regular Rate:Normal     Neuro/Psych    GI/Hepatic   Endo/Other    Renal/GU      Musculoskeletal   Abdominal   Peds  Hematology   Anesthesia Other Findings   Reproductive/Obstetrics                           Anesthesia Physical Anesthesia Plan  ASA: II  Anesthesia Plan: MAC   Post-op Pain Management:    Induction: Intravenous  Airway Management Planned: Nasal Cannula  Additional Equipment:   Intra-op Plan:   Post-operative Plan:   Informed Consent: I have reviewed the patients History and Physical, chart, labs and discussed the procedure including the risks, benefits and alternatives for the proposed anesthesia with the patient or authorized representative who has indicated his/her understanding and acceptance.     Plan Discussed with:   Anesthesia Plan Comments:         Anesthesia Quick Evaluation

## 2013-01-18 NOTE — Transfer of Care (Signed)
Immediate Anesthesia Transfer of Care Note  Patient: Randy Russo  Procedure(s) Performed: Procedure(s) with comments: CATARACT EXTRACTION PHACO AND INTRAOCULAR LENS PLACEMENT (IOC) (Right) - CDE:8.79  Patient Location: Short Stay  Anesthesia Type:MAC  Level of Consciousness: awake, alert , oriented and patient cooperative  Airway & Oxygen Therapy: Patient Spontanous Breathing  Post-op Assessment: Report given to PACU RN and Post -op Vital signs reviewed and stable  Post vital signs: Reviewed and stable  Complications: No apparent anesthesia complications

## 2013-01-18 NOTE — H&P (Signed)
I have reviewed the H&P, the patient was re-examined, and I have identified no interval changes in medical condition and plan of care since the history and physical of record  

## 2013-01-18 NOTE — Anesthesia Postprocedure Evaluation (Signed)
  Anesthesia Post-op Note  Patient: Randy Russo  Procedure(s) Performed: Procedure(s) with comments: CATARACT EXTRACTION PHACO AND INTRAOCULAR LENS PLACEMENT (IOC) (Right) - CDE:8.79  Patient Location: Short Stay  Anesthesia Type:MAC  Level of Consciousness: awake, alert , oriented and patient cooperative  Airway and Oxygen Therapy: Patient Spontanous Breathing  Post-op Pain: none  Post-op Assessment: Post-op Vital signs reviewed, Patient's Cardiovascular Status Stable, Respiratory Function Stable, Patent Airway and Pain level controlled  Post-op Vital Signs: Reviewed and stable  Complications: No apparent anesthesia complications

## 2013-01-18 NOTE — Op Note (Signed)
Date of Admission: 01/18/2013  Date of Surgery: 01/18/2013  Pre-Op Dx: Cataract  Right  Eye  Post-Op Dx: Nuclear Cataract  Right  Eye,  Dx Code 366.16  Surgeon: Gemma Payor, M.D.  Assistants: None  Anesthesia: Topical with MAC  Indications: Painless, progressive loss of vision with compromise of daily activities.  Surgery: Cataract Extraction with Intraocular lens Implant Right Eye  Discription: The patient had dilating drops and viscous lidocaine placed into the right eye in the pre-op holding area. After transfer to the operating room, a time out was performed. The patient was then prepped and draped. Beginning with a 75 degree blade a paracentesis port was made at the surgeon's 2 o'clock position. The anterior chamber was then filled with 1% non-preserved lidocaine. This was followed by filling the anterior chamber with Provisc.  A 2.63mm keratome blade was used to make a clear corneal incision at the temporal limbus.  A bent cystatome needle was used to create a continuous tear capsulotomy. Hydrodissection was performed with balanced salt solution on a Fine canula. The lens nucleus was then removed using the phacoemulsification handpiece. Residual cortex was removed with the I&A handpiece. The anterior chamber and capsular bag were refilled with Provisc. A posterior chamber intraocular lens was placed into the capsular bag with it's injector. The implant was positioned with the Kuglan hook. The Provisc was then removed from the anterior chamber and capsular bag with the I&A handpiece. Stromal hydration of the main incision and paracentesis port was performed with BSS on a Fine canula. The wounds were tested for leak which was negative. The patient tolerated the procedure well. There were no operative complications. The patient was then transferred to the recovery room in stable condition.  Complications: None  Specimen: None  EBL: None  Prosthetic device: B&L enVista, MX60, power 22.5D,  SN [id number].

## 2013-01-20 ENCOUNTER — Encounter (HOSPITAL_COMMUNITY): Payer: Self-pay | Admitting: Ophthalmology

## 2013-08-20 ENCOUNTER — Ambulatory Visit (INDEPENDENT_AMBULATORY_CARE_PROVIDER_SITE_OTHER): Payer: Commercial Managed Care - HMO | Admitting: Urology

## 2013-08-20 DIAGNOSIS — N410 Acute prostatitis: Secondary | ICD-10-CM

## 2013-08-20 DIAGNOSIS — R39198 Other difficulties with micturition: Secondary | ICD-10-CM

## 2013-08-20 DIAGNOSIS — C61 Malignant neoplasm of prostate: Secondary | ICD-10-CM

## 2014-02-11 ENCOUNTER — Ambulatory Visit (INDEPENDENT_AMBULATORY_CARE_PROVIDER_SITE_OTHER): Payer: Commercial Managed Care - HMO | Admitting: Urology

## 2014-02-11 DIAGNOSIS — C61 Malignant neoplasm of prostate: Secondary | ICD-10-CM

## 2014-06-01 DIAGNOSIS — I10 Essential (primary) hypertension: Secondary | ICD-10-CM | POA: Diagnosis not present

## 2014-06-01 DIAGNOSIS — R0602 Shortness of breath: Secondary | ICD-10-CM | POA: Diagnosis not present

## 2014-06-01 DIAGNOSIS — Z87891 Personal history of nicotine dependence: Secondary | ICD-10-CM | POA: Diagnosis not present

## 2014-06-01 DIAGNOSIS — Z8546 Personal history of malignant neoplasm of prostate: Secondary | ICD-10-CM | POA: Diagnosis not present

## 2014-06-01 DIAGNOSIS — Z79899 Other long term (current) drug therapy: Secondary | ICD-10-CM | POA: Diagnosis not present

## 2014-06-01 DIAGNOSIS — Z836 Family history of other diseases of the respiratory system: Secondary | ICD-10-CM | POA: Diagnosis not present

## 2014-06-01 DIAGNOSIS — J441 Chronic obstructive pulmonary disease with (acute) exacerbation: Secondary | ICD-10-CM | POA: Diagnosis not present

## 2014-06-01 DIAGNOSIS — Z923 Personal history of irradiation: Secondary | ICD-10-CM | POA: Diagnosis not present

## 2014-06-01 DIAGNOSIS — R062 Wheezing: Secondary | ICD-10-CM | POA: Diagnosis not present

## 2014-06-01 DIAGNOSIS — Z8249 Family history of ischemic heart disease and other diseases of the circulatory system: Secondary | ICD-10-CM | POA: Diagnosis not present

## 2014-06-09 DIAGNOSIS — J449 Chronic obstructive pulmonary disease, unspecified: Secondary | ICD-10-CM | POA: Diagnosis not present

## 2014-06-09 DIAGNOSIS — Z6838 Body mass index (BMI) 38.0-38.9, adult: Secondary | ICD-10-CM | POA: Diagnosis not present

## 2014-08-03 DIAGNOSIS — C61 Malignant neoplasm of prostate: Secondary | ICD-10-CM | POA: Diagnosis not present

## 2014-11-11 DIAGNOSIS — C61 Malignant neoplasm of prostate: Secondary | ICD-10-CM | POA: Diagnosis not present

## 2014-11-18 ENCOUNTER — Ambulatory Visit: Payer: Commercial Managed Care - HMO | Admitting: Urology

## 2015-03-17 ENCOUNTER — Ambulatory Visit: Payer: Commercial Managed Care - HMO | Admitting: Urology

## 2015-04-14 ENCOUNTER — Other Ambulatory Visit: Payer: Self-pay | Admitting: Urology

## 2015-04-14 ENCOUNTER — Ambulatory Visit (INDEPENDENT_AMBULATORY_CARE_PROVIDER_SITE_OTHER): Payer: Commercial Managed Care - HMO | Admitting: Urology

## 2015-04-14 DIAGNOSIS — R310 Gross hematuria: Secondary | ICD-10-CM | POA: Diagnosis not present

## 2015-04-14 DIAGNOSIS — C61 Malignant neoplasm of prostate: Secondary | ICD-10-CM

## 2015-04-14 DIAGNOSIS — N5201 Erectile dysfunction due to arterial insufficiency: Secondary | ICD-10-CM | POA: Diagnosis not present

## 2015-04-14 DIAGNOSIS — N3941 Urge incontinence: Secondary | ICD-10-CM | POA: Diagnosis not present

## 2015-04-24 ENCOUNTER — Ambulatory Visit (HOSPITAL_COMMUNITY): Payer: Commercial Managed Care - HMO

## 2015-04-28 ENCOUNTER — Ambulatory Visit (HOSPITAL_COMMUNITY)
Admission: RE | Admit: 2015-04-28 | Discharge: 2015-04-28 | Disposition: A | Discharge status: Home / Self Care | Payer: Commercial Managed Care - HMO | Source: Ambulatory Visit | Attending: Urology | Admitting: Urology

## 2015-04-28 DIAGNOSIS — Z8546 Personal history of malignant neoplasm of prostate: Secondary | ICD-10-CM | POA: Diagnosis not present

## 2015-04-28 DIAGNOSIS — K5730 Diverticulosis of large intestine without perforation or abscess without bleeding: Secondary | ICD-10-CM | POA: Diagnosis not present

## 2015-04-28 DIAGNOSIS — R310 Gross hematuria: Secondary | ICD-10-CM | POA: Insufficient documentation

## 2015-04-28 DIAGNOSIS — K429 Umbilical hernia without obstruction or gangrene: Secondary | ICD-10-CM | POA: Diagnosis not present

## 2015-04-28 DIAGNOSIS — N289 Disorder of kidney and ureter, unspecified: Secondary | ICD-10-CM | POA: Diagnosis not present

## 2015-04-28 DIAGNOSIS — N2889 Other specified disorders of kidney and ureter: Secondary | ICD-10-CM | POA: Diagnosis not present

## 2015-04-28 MED ORDER — IOHEXOL 300 MG/ML  SOLN
125.0000 mL | Freq: Once | INTRAMUSCULAR | Status: AC | PRN
  Administered 2015-04-28: 125 mL via INTRAVENOUS

## 2015-05-18 DIAGNOSIS — H524 Presbyopia: Secondary | ICD-10-CM | POA: Diagnosis not present

## 2015-05-18 DIAGNOSIS — H2510 Age-related nuclear cataract, unspecified eye: Secondary | ICD-10-CM | POA: Diagnosis not present

## 2015-05-18 DIAGNOSIS — H5210 Myopia, unspecified eye: Secondary | ICD-10-CM | POA: Diagnosis not present

## 2015-05-26 ENCOUNTER — Other Ambulatory Visit: Payer: Self-pay | Admitting: Urology

## 2015-05-26 ENCOUNTER — Ambulatory Visit (INDEPENDENT_AMBULATORY_CARE_PROVIDER_SITE_OTHER): Payer: Commercial Managed Care - HMO | Admitting: Urology

## 2015-05-26 DIAGNOSIS — N281 Cyst of kidney, acquired: Secondary | ICD-10-CM

## 2015-05-26 DIAGNOSIS — C61 Malignant neoplasm of prostate: Secondary | ICD-10-CM

## 2015-05-26 DIAGNOSIS — R310 Gross hematuria: Secondary | ICD-10-CM

## 2015-05-26 DIAGNOSIS — R9349 Abnormal radiologic findings on diagnostic imaging of other urinary organs: Secondary | ICD-10-CM

## 2015-05-26 DIAGNOSIS — N3941 Urge incontinence: Secondary | ICD-10-CM

## 2015-05-26 DIAGNOSIS — N403 Nodular prostate with lower urinary tract symptoms: Secondary | ICD-10-CM

## 2015-06-01 DIAGNOSIS — H02403 Unspecified ptosis of bilateral eyelids: Secondary | ICD-10-CM | POA: Diagnosis not present

## 2015-06-01 DIAGNOSIS — H401133 Primary open-angle glaucoma, bilateral, severe stage: Secondary | ICD-10-CM | POA: Diagnosis not present

## 2015-06-08 ENCOUNTER — Ambulatory Visit (HOSPITAL_COMMUNITY)
Admission: RE | Admit: 2015-06-08 | Discharge: 2015-06-08 | Disposition: A | Discharge status: Home / Self Care | Payer: Commercial Managed Care - HMO | Source: Ambulatory Visit | Attending: Urology | Admitting: Urology

## 2015-06-08 DIAGNOSIS — N281 Cyst of kidney, acquired: Secondary | ICD-10-CM

## 2015-06-08 DIAGNOSIS — R9349 Abnormal radiologic findings on diagnostic imaging of other urinary organs: Secondary | ICD-10-CM

## 2015-06-08 MED ORDER — SODIUM CHLORIDE 0.9 % IV SOLN
INTRAVENOUS | Status: AC
  Filled 2015-06-08: qty 250

## 2015-06-22 ENCOUNTER — Ambulatory Visit
Admission: RE | Admit: 2015-06-22 | Discharge: 2015-06-22 | Disposition: A | Discharge status: Home / Self Care | Payer: Commercial Managed Care - HMO | Source: Ambulatory Visit | Attending: Urology | Admitting: Urology

## 2015-06-22 DIAGNOSIS — N281 Cyst of kidney, acquired: Secondary | ICD-10-CM | POA: Diagnosis not present

## 2015-06-22 DIAGNOSIS — R9349 Abnormal radiologic findings on diagnostic imaging of other urinary organs: Secondary | ICD-10-CM

## 2015-06-22 MED ORDER — GADOBENATE DIMEGLUMINE 529 MG/ML IV SOLN
20.0000 mL | Freq: Once | INTRAVENOUS | Status: AC | PRN
  Administered 2015-06-22: 20 mL via INTRAVENOUS

## 2015-08-09 DIAGNOSIS — Z6841 Body Mass Index (BMI) 40.0 and over, adult: Secondary | ICD-10-CM | POA: Diagnosis not present

## 2015-08-09 DIAGNOSIS — J449 Chronic obstructive pulmonary disease, unspecified: Secondary | ICD-10-CM | POA: Diagnosis not present

## 2015-08-09 DIAGNOSIS — Z1389 Encounter for screening for other disorder: Secondary | ICD-10-CM | POA: Diagnosis not present

## 2015-08-09 DIAGNOSIS — I10 Essential (primary) hypertension: Secondary | ICD-10-CM | POA: Diagnosis not present

## 2015-08-10 DIAGNOSIS — Z0000 Encounter for general adult medical examination without abnormal findings: Secondary | ICD-10-CM | POA: Diagnosis not present

## 2015-08-10 DIAGNOSIS — I10 Essential (primary) hypertension: Secondary | ICD-10-CM | POA: Diagnosis not present

## 2015-08-10 DIAGNOSIS — J449 Chronic obstructive pulmonary disease, unspecified: Secondary | ICD-10-CM | POA: Diagnosis not present

## 2015-08-10 DIAGNOSIS — Z6841 Body Mass Index (BMI) 40.0 and over, adult: Secondary | ICD-10-CM | POA: Diagnosis not present

## 2015-08-23 DIAGNOSIS — N3941 Urge incontinence: Secondary | ICD-10-CM | POA: Diagnosis not present

## 2015-09-29 ENCOUNTER — Ambulatory Visit: Payer: Commercial Managed Care - HMO | Admitting: Urology

## 2016-09-21 DIAGNOSIS — N390 Urinary tract infection, site not specified: Secondary | ICD-10-CM | POA: Diagnosis not present

## 2016-09-21 DIAGNOSIS — N179 Acute kidney failure, unspecified: Secondary | ICD-10-CM | POA: Diagnosis not present

## 2016-09-21 DIAGNOSIS — I10 Essential (primary) hypertension: Secondary | ICD-10-CM | POA: Diagnosis not present

## 2016-09-21 DIAGNOSIS — R112 Nausea with vomiting, unspecified: Secondary | ICD-10-CM | POA: Diagnosis not present

## 2016-09-21 DIAGNOSIS — R197 Diarrhea, unspecified: Secondary | ICD-10-CM | POA: Diagnosis not present

## 2016-09-21 DIAGNOSIS — R319 Hematuria, unspecified: Secondary | ICD-10-CM | POA: Diagnosis not present

## 2016-09-21 DIAGNOSIS — J449 Chronic obstructive pulmonary disease, unspecified: Secondary | ICD-10-CM | POA: Diagnosis not present

## 2016-09-21 DIAGNOSIS — E860 Dehydration: Secondary | ICD-10-CM | POA: Diagnosis not present

## 2016-09-21 DIAGNOSIS — Z8546 Personal history of malignant neoplasm of prostate: Secondary | ICD-10-CM | POA: Diagnosis not present

## 2016-09-21 DIAGNOSIS — R509 Fever, unspecified: Secondary | ICD-10-CM | POA: Diagnosis not present

## 2016-09-21 DIAGNOSIS — Z923 Personal history of irradiation: Secondary | ICD-10-CM | POA: Diagnosis not present

## 2016-09-23 DIAGNOSIS — J449 Chronic obstructive pulmonary disease, unspecified: Secondary | ICD-10-CM | POA: Diagnosis not present

## 2016-09-23 DIAGNOSIS — E860 Dehydration: Secondary | ICD-10-CM | POA: Diagnosis not present

## 2016-09-23 DIAGNOSIS — N179 Acute kidney failure, unspecified: Secondary | ICD-10-CM | POA: Diagnosis not present

## 2016-10-02 DIAGNOSIS — Z6835 Body mass index (BMI) 35.0-35.9, adult: Secondary | ICD-10-CM | POA: Diagnosis not present

## 2016-10-02 DIAGNOSIS — I10 Essential (primary) hypertension: Secondary | ICD-10-CM | POA: Diagnosis not present

## 2016-10-02 DIAGNOSIS — J449 Chronic obstructive pulmonary disease, unspecified: Secondary | ICD-10-CM | POA: Diagnosis not present

## 2016-10-02 DIAGNOSIS — E860 Dehydration: Secondary | ICD-10-CM | POA: Diagnosis not present

## 2016-10-02 DIAGNOSIS — Z1389 Encounter for screening for other disorder: Secondary | ICD-10-CM | POA: Diagnosis not present

## 2016-10-02 DIAGNOSIS — N390 Urinary tract infection, site not specified: Secondary | ICD-10-CM | POA: Diagnosis not present

## 2016-12-11 DIAGNOSIS — Z8546 Personal history of malignant neoplasm of prostate: Secondary | ICD-10-CM | POA: Diagnosis not present

## 2016-12-11 DIAGNOSIS — J449 Chronic obstructive pulmonary disease, unspecified: Secondary | ICD-10-CM | POA: Diagnosis not present

## 2016-12-11 DIAGNOSIS — Z87891 Personal history of nicotine dependence: Secondary | ICD-10-CM | POA: Diagnosis not present

## 2016-12-11 DIAGNOSIS — Z9079 Acquired absence of other genital organ(s): Secondary | ICD-10-CM | POA: Diagnosis not present

## 2016-12-11 DIAGNOSIS — I10 Essential (primary) hypertension: Secondary | ICD-10-CM | POA: Diagnosis not present

## 2016-12-11 DIAGNOSIS — R319 Hematuria, unspecified: Secondary | ICD-10-CM | POA: Diagnosis not present

## 2016-12-11 DIAGNOSIS — Z923 Personal history of irradiation: Secondary | ICD-10-CM | POA: Diagnosis not present

## 2016-12-11 DIAGNOSIS — Z79899 Other long term (current) drug therapy: Secondary | ICD-10-CM | POA: Diagnosis not present

## 2017-01-10 ENCOUNTER — Other Ambulatory Visit: Payer: Self-pay | Admitting: Urology

## 2017-01-10 ENCOUNTER — Ambulatory Visit (INDEPENDENT_AMBULATORY_CARE_PROVIDER_SITE_OTHER): Payer: Medicare HMO | Admitting: Urology

## 2017-01-10 DIAGNOSIS — C61 Malignant neoplasm of prostate: Secondary | ICD-10-CM

## 2017-01-10 DIAGNOSIS — R310 Gross hematuria: Secondary | ICD-10-CM | POA: Diagnosis not present

## 2017-01-10 DIAGNOSIS — R9721 Rising PSA following treatment for malignant neoplasm of prostate: Secondary | ICD-10-CM | POA: Diagnosis not present

## 2017-01-21 ENCOUNTER — Other Ambulatory Visit: Payer: Self-pay | Admitting: Urology

## 2017-01-21 DIAGNOSIS — R310 Gross hematuria: Secondary | ICD-10-CM

## 2017-01-23 ENCOUNTER — Other Ambulatory Visit (HOSPITAL_COMMUNITY): Payer: Medicare HMO

## 2017-01-24 ENCOUNTER — Encounter (HOSPITAL_COMMUNITY)
Admission: RE | Admit: 2017-01-24 | Discharge: 2017-01-24 | Disposition: A | Discharge status: Home / Self Care | Payer: Medicare HMO | Source: Ambulatory Visit | Attending: Urology | Admitting: Urology

## 2017-01-24 ENCOUNTER — Encounter (HOSPITAL_COMMUNITY): Payer: Self-pay

## 2017-01-24 DIAGNOSIS — R972 Elevated prostate specific antigen [PSA]: Secondary | ICD-10-CM | POA: Diagnosis not present

## 2017-01-24 DIAGNOSIS — C61 Malignant neoplasm of prostate: Secondary | ICD-10-CM | POA: Insufficient documentation

## 2017-01-24 DIAGNOSIS — R9721 Rising PSA following treatment for malignant neoplasm of prostate: Secondary | ICD-10-CM

## 2017-01-24 MED ORDER — TECHNETIUM TC 99M MEDRONATE IV KIT
25.0000 | PACK | Freq: Once | INTRAVENOUS | Status: AC | PRN
  Administered 2017-01-24: 25 via INTRAVENOUS

## 2017-02-03 ENCOUNTER — Ambulatory Visit (HOSPITAL_COMMUNITY): Payer: Medicare HMO

## 2017-02-05 ENCOUNTER — Ambulatory Visit (HOSPITAL_COMMUNITY)
Admission: RE | Admit: 2017-02-05 | Discharge: 2017-02-05 | Disposition: A | Discharge status: Home / Self Care | Payer: Medicare HMO | Source: Ambulatory Visit | Attending: Urology | Admitting: Urology

## 2017-02-05 DIAGNOSIS — K429 Umbilical hernia without obstruction or gangrene: Secondary | ICD-10-CM | POA: Diagnosis not present

## 2017-02-05 DIAGNOSIS — R310 Gross hematuria: Secondary | ICD-10-CM

## 2017-02-05 DIAGNOSIS — N4289 Other specified disorders of prostate: Secondary | ICD-10-CM | POA: Insufficient documentation

## 2017-02-05 DIAGNOSIS — M47816 Spondylosis without myelopathy or radiculopathy, lumbar region: Secondary | ICD-10-CM | POA: Diagnosis not present

## 2017-02-05 DIAGNOSIS — M5137 Other intervertebral disc degeneration, lumbosacral region: Secondary | ICD-10-CM | POA: Insufficient documentation

## 2017-02-05 DIAGNOSIS — Z96641 Presence of right artificial hip joint: Secondary | ICD-10-CM | POA: Insufficient documentation

## 2017-02-05 DIAGNOSIS — N281 Cyst of kidney, acquired: Secondary | ICD-10-CM | POA: Diagnosis not present

## 2017-02-05 DIAGNOSIS — K5730 Diverticulosis of large intestine without perforation or abscess without bleeding: Secondary | ICD-10-CM | POA: Diagnosis not present

## 2017-02-05 DIAGNOSIS — R9721 Rising PSA following treatment for malignant neoplasm of prostate: Secondary | ICD-10-CM | POA: Diagnosis present

## 2017-02-05 DIAGNOSIS — M1612 Unilateral primary osteoarthritis, left hip: Secondary | ICD-10-CM | POA: Insufficient documentation

## 2017-02-05 DIAGNOSIS — C61 Malignant neoplasm of prostate: Secondary | ICD-10-CM | POA: Diagnosis present

## 2017-02-05 DIAGNOSIS — R319 Hematuria, unspecified: Secondary | ICD-10-CM | POA: Diagnosis not present

## 2017-02-05 LAB — POCT I-STAT CREATININE: Creatinine, Ser: 0.9 mg/dL (ref 0.61–1.24)

## 2017-02-05 MED ORDER — IOPAMIDOL (ISOVUE-300) INJECTION 61%
125.0000 mL | Freq: Once | INTRAVENOUS | Status: AC | PRN
  Administered 2017-02-05: 125 mL via INTRAVENOUS

## 2017-02-07 ENCOUNTER — Ambulatory Visit: Payer: Medicare HMO | Admitting: Urology

## 2017-02-07 DIAGNOSIS — Z8546 Personal history of malignant neoplasm of prostate: Secondary | ICD-10-CM | POA: Diagnosis not present

## 2017-02-07 DIAGNOSIS — R310 Gross hematuria: Secondary | ICD-10-CM | POA: Diagnosis not present

## 2017-02-07 DIAGNOSIS — N3041 Irradiation cystitis with hematuria: Secondary | ICD-10-CM

## 2017-05-01 DIAGNOSIS — Z8546 Personal history of malignant neoplasm of prostate: Secondary | ICD-10-CM | POA: Diagnosis not present

## 2017-05-09 ENCOUNTER — Other Ambulatory Visit (HOSPITAL_COMMUNITY)
Admission: AD | Admit: 2017-05-09 | Discharge: 2017-05-09 | Disposition: A | Discharge status: Home / Self Care | Payer: Medicare HMO | Source: Other Acute Inpatient Hospital | Attending: Urology | Admitting: Urology

## 2017-05-09 ENCOUNTER — Ambulatory Visit: Payer: Medicare HMO | Admitting: Urology

## 2017-05-09 DIAGNOSIS — R9721 Rising PSA following treatment for malignant neoplasm of prostate: Secondary | ICD-10-CM | POA: Diagnosis not present

## 2017-05-09 DIAGNOSIS — R310 Gross hematuria: Secondary | ICD-10-CM | POA: Insufficient documentation

## 2017-05-09 DIAGNOSIS — C61 Malignant neoplasm of prostate: Secondary | ICD-10-CM

## 2017-05-09 LAB — URINALYSIS, COMPLETE (UACMP) WITH MICROSCOPIC
BILIRUBIN URINE: NEGATIVE
Glucose, UA: NEGATIVE mg/dL
Ketones, ur: NEGATIVE mg/dL
Nitrite: NEGATIVE
PH: 5 (ref 5.0–8.0)
Protein, ur: NEGATIVE mg/dL
SPECIFIC GRAVITY, URINE: 1.017 (ref 1.005–1.030)

## 2017-05-31 DIAGNOSIS — Z8546 Personal history of malignant neoplasm of prostate: Secondary | ICD-10-CM | POA: Diagnosis not present

## 2017-05-31 DIAGNOSIS — Z923 Personal history of irradiation: Secondary | ICD-10-CM | POA: Diagnosis not present

## 2017-05-31 DIAGNOSIS — Z8709 Personal history of other diseases of the respiratory system: Secondary | ICD-10-CM | POA: Diagnosis not present

## 2017-05-31 DIAGNOSIS — J069 Acute upper respiratory infection, unspecified: Secondary | ICD-10-CM | POA: Diagnosis not present

## 2017-05-31 DIAGNOSIS — H1033 Unspecified acute conjunctivitis, bilateral: Secondary | ICD-10-CM | POA: Diagnosis not present

## 2017-05-31 DIAGNOSIS — B309 Viral conjunctivitis, unspecified: Secondary | ICD-10-CM | POA: Diagnosis not present

## 2017-05-31 DIAGNOSIS — Z87891 Personal history of nicotine dependence: Secondary | ICD-10-CM | POA: Diagnosis not present

## 2017-05-31 DIAGNOSIS — I10 Essential (primary) hypertension: Secondary | ICD-10-CM | POA: Diagnosis not present

## 2017-05-31 DIAGNOSIS — J449 Chronic obstructive pulmonary disease, unspecified: Secondary | ICD-10-CM | POA: Diagnosis not present

## 2017-05-31 DIAGNOSIS — Z8249 Family history of ischemic heart disease and other diseases of the circulatory system: Secondary | ICD-10-CM | POA: Diagnosis not present

## 2017-08-04 DIAGNOSIS — G4731 Primary central sleep apnea: Secondary | ICD-10-CM | POA: Diagnosis not present

## 2017-08-04 DIAGNOSIS — Z1389 Encounter for screening for other disorder: Secondary | ICD-10-CM | POA: Diagnosis not present

## 2017-08-04 DIAGNOSIS — Z23 Encounter for immunization: Secondary | ICD-10-CM | POA: Diagnosis not present

## 2017-08-04 DIAGNOSIS — J449 Chronic obstructive pulmonary disease, unspecified: Secondary | ICD-10-CM | POA: Diagnosis not present

## 2017-08-04 DIAGNOSIS — I10 Essential (primary) hypertension: Secondary | ICD-10-CM | POA: Diagnosis not present

## 2017-08-04 DIAGNOSIS — E6609 Other obesity due to excess calories: Secondary | ICD-10-CM | POA: Diagnosis not present

## 2017-08-04 DIAGNOSIS — E782 Mixed hyperlipidemia: Secondary | ICD-10-CM | POA: Diagnosis not present

## 2017-08-04 DIAGNOSIS — R7309 Other abnormal glucose: Secondary | ICD-10-CM | POA: Diagnosis not present

## 2017-08-12 DIAGNOSIS — G4730 Sleep apnea, unspecified: Secondary | ICD-10-CM | POA: Diagnosis not present

## 2017-08-13 DIAGNOSIS — G4730 Sleep apnea, unspecified: Secondary | ICD-10-CM | POA: Diagnosis not present

## 2017-09-03 DIAGNOSIS — G4733 Obstructive sleep apnea (adult) (pediatric): Secondary | ICD-10-CM | POA: Diagnosis not present

## 2017-09-05 ENCOUNTER — Ambulatory Visit: Payer: Medicare HMO | Admitting: Urology

## 2017-09-22 DIAGNOSIS — E6609 Other obesity due to excess calories: Secondary | ICD-10-CM | POA: Diagnosis not present

## 2017-09-22 DIAGNOSIS — Z0001 Encounter for general adult medical examination with abnormal findings: Secondary | ICD-10-CM | POA: Diagnosis not present

## 2017-09-22 DIAGNOSIS — Z6834 Body mass index (BMI) 34.0-34.9, adult: Secondary | ICD-10-CM | POA: Diagnosis not present

## 2017-09-22 DIAGNOSIS — Z1389 Encounter for screening for other disorder: Secondary | ICD-10-CM | POA: Diagnosis not present

## 2017-09-22 DIAGNOSIS — R7309 Other abnormal glucose: Secondary | ICD-10-CM | POA: Diagnosis not present

## 2017-09-29 DIAGNOSIS — G4733 Obstructive sleep apnea (adult) (pediatric): Secondary | ICD-10-CM | POA: Diagnosis not present

## 2017-10-17 ENCOUNTER — Encounter: Payer: Self-pay | Admitting: Internal Medicine

## 2017-10-30 DIAGNOSIS — H524 Presbyopia: Secondary | ICD-10-CM | POA: Diagnosis not present

## 2017-10-30 DIAGNOSIS — Z0100 Encounter for examination of eyes and vision without abnormal findings: Secondary | ICD-10-CM | POA: Diagnosis not present

## 2017-10-30 DIAGNOSIS — H40013 Open angle with borderline findings, low risk, bilateral: Secondary | ICD-10-CM | POA: Diagnosis not present

## 2017-10-30 DIAGNOSIS — G4733 Obstructive sleep apnea (adult) (pediatric): Secondary | ICD-10-CM | POA: Diagnosis not present

## 2017-11-25 ENCOUNTER — Ambulatory Visit: Payer: Medicare HMO

## 2017-11-25 ENCOUNTER — Telehealth: Payer: Self-pay

## 2017-11-25 NOTE — Telephone Encounter (Signed)
PATIENT WAS A NO SHOW AND LETTER SENT  °

## 2017-11-25 NOTE — Telephone Encounter (Signed)
noted 

## 2017-11-30 DIAGNOSIS — G4733 Obstructive sleep apnea (adult) (pediatric): Secondary | ICD-10-CM | POA: Diagnosis not present

## 2017-12-27 ENCOUNTER — Other Ambulatory Visit: Payer: Self-pay

## 2017-12-27 ENCOUNTER — Encounter (HOSPITAL_COMMUNITY): Payer: Self-pay | Admitting: Emergency Medicine

## 2017-12-27 ENCOUNTER — Inpatient Hospital Stay (HOSPITAL_COMMUNITY)
Admission: EM | Admit: 2017-12-27 | Discharge: 2018-01-08 | DRG: 330 | Disposition: A | Discharge status: Home Health | Payer: Medicare HMO | Attending: Internal Medicine | Admitting: Internal Medicine

## 2017-12-27 DIAGNOSIS — K644 Residual hemorrhoidal skin tags: Secondary | ICD-10-CM | POA: Diagnosis present

## 2017-12-27 DIAGNOSIS — Z96641 Presence of right artificial hip joint: Secondary | ICD-10-CM | POA: Diagnosis present

## 2017-12-27 DIAGNOSIS — D62 Acute posthemorrhagic anemia: Secondary | ICD-10-CM

## 2017-12-27 DIAGNOSIS — Z7982 Long term (current) use of aspirin: Secondary | ICD-10-CM

## 2017-12-27 DIAGNOSIS — R55 Syncope and collapse: Secondary | ICD-10-CM

## 2017-12-27 DIAGNOSIS — I959 Hypotension, unspecified: Secondary | ICD-10-CM | POA: Diagnosis present

## 2017-12-27 DIAGNOSIS — K922 Gastrointestinal hemorrhage, unspecified: Secondary | ICD-10-CM

## 2017-12-27 DIAGNOSIS — I10 Essential (primary) hypertension: Secondary | ICD-10-CM | POA: Diagnosis present

## 2017-12-27 DIAGNOSIS — Z8249 Family history of ischemic heart disease and other diseases of the circulatory system: Secondary | ICD-10-CM

## 2017-12-27 DIAGNOSIS — I472 Ventricular tachycardia: Secondary | ICD-10-CM | POA: Diagnosis not present

## 2017-12-27 DIAGNOSIS — K228 Other specified diseases of esophagus: Secondary | ICD-10-CM | POA: Diagnosis not present

## 2017-12-27 DIAGNOSIS — K5731 Diverticulosis of large intestine without perforation or abscess with bleeding: Secondary | ICD-10-CM | POA: Diagnosis present

## 2017-12-27 DIAGNOSIS — Z833 Family history of diabetes mellitus: Secondary | ICD-10-CM | POA: Diagnosis not present

## 2017-12-27 DIAGNOSIS — Z981 Arthrodesis status: Secondary | ICD-10-CM

## 2017-12-27 DIAGNOSIS — R1110 Vomiting, unspecified: Secondary | ICD-10-CM

## 2017-12-27 DIAGNOSIS — K5730 Diverticulosis of large intestine without perforation or abscess without bleeding: Secondary | ICD-10-CM | POA: Diagnosis not present

## 2017-12-27 DIAGNOSIS — J449 Chronic obstructive pulmonary disease, unspecified: Secondary | ICD-10-CM | POA: Diagnosis present

## 2017-12-27 DIAGNOSIS — K219 Gastro-esophageal reflux disease without esophagitis: Secondary | ICD-10-CM | POA: Diagnosis present

## 2017-12-27 DIAGNOSIS — F17200 Nicotine dependence, unspecified, uncomplicated: Secondary | ICD-10-CM | POA: Diagnosis present

## 2017-12-27 DIAGNOSIS — S0990XA Unspecified injury of head, initial encounter: Secondary | ICD-10-CM | POA: Diagnosis not present

## 2017-12-27 DIAGNOSIS — Z9842 Cataract extraction status, left eye: Secondary | ICD-10-CM

## 2017-12-27 DIAGNOSIS — K625 Hemorrhage of anus and rectum: Secondary | ICD-10-CM | POA: Diagnosis not present

## 2017-12-27 DIAGNOSIS — R42 Dizziness and giddiness: Secondary | ICD-10-CM | POA: Diagnosis not present

## 2017-12-27 DIAGNOSIS — M1990 Unspecified osteoarthritis, unspecified site: Secondary | ICD-10-CM | POA: Diagnosis present

## 2017-12-27 DIAGNOSIS — R1310 Dysphagia, unspecified: Secondary | ICD-10-CM

## 2017-12-27 DIAGNOSIS — Z961 Presence of intraocular lens: Secondary | ICD-10-CM | POA: Diagnosis present

## 2017-12-27 DIAGNOSIS — E876 Hypokalemia: Secondary | ICD-10-CM | POA: Diagnosis not present

## 2017-12-27 DIAGNOSIS — J441 Chronic obstructive pulmonary disease with (acute) exacerbation: Secondary | ICD-10-CM | POA: Diagnosis not present

## 2017-12-27 DIAGNOSIS — R569 Unspecified convulsions: Secondary | ICD-10-CM | POA: Diagnosis not present

## 2017-12-27 DIAGNOSIS — R112 Nausea with vomiting, unspecified: Secondary | ICD-10-CM | POA: Diagnosis not present

## 2017-12-27 DIAGNOSIS — Z9841 Cataract extraction status, right eye: Secondary | ICD-10-CM | POA: Diagnosis not present

## 2017-12-27 DIAGNOSIS — R0902 Hypoxemia: Secondary | ICD-10-CM | POA: Diagnosis not present

## 2017-12-27 DIAGNOSIS — G4733 Obstructive sleep apnea (adult) (pediatric): Secondary | ICD-10-CM | POA: Diagnosis not present

## 2017-12-27 DIAGNOSIS — R0689 Other abnormalities of breathing: Secondary | ICD-10-CM | POA: Diagnosis not present

## 2017-12-27 DIAGNOSIS — K3189 Other diseases of stomach and duodenum: Secondary | ICD-10-CM | POA: Diagnosis not present

## 2017-12-27 DIAGNOSIS — D649 Anemia, unspecified: Secondary | ICD-10-CM | POA: Diagnosis not present

## 2017-12-27 HISTORY — DX: Gastrointestinal hemorrhage, unspecified: K92.2

## 2017-12-27 HISTORY — DX: Essential (primary) hypertension: I10

## 2017-12-27 LAB — CBC
HCT: 34.6 % — ABNORMAL LOW (ref 39.0–52.0)
Hemoglobin: 11.1 g/dL — ABNORMAL LOW (ref 13.0–17.0)
MCH: 31.2 pg (ref 26.0–34.0)
MCHC: 32.1 g/dL (ref 30.0–36.0)
MCV: 97.2 fL (ref 78.0–100.0)
PLATELETS: 191 10*3/uL (ref 150–400)
RBC: 3.56 MIL/uL — ABNORMAL LOW (ref 4.22–5.81)
RDW: 13.6 % (ref 11.5–15.5)
WBC: 8.1 10*3/uL (ref 4.0–10.5)

## 2017-12-27 LAB — COMPREHENSIVE METABOLIC PANEL
ALK PHOS: 40 U/L (ref 38–126)
ALT: 18 U/L (ref 0–44)
AST: 17 U/L (ref 15–41)
Albumin: 3.1 g/dL — ABNORMAL LOW (ref 3.5–5.0)
Anion gap: 9 (ref 5–15)
BUN: 15 mg/dL (ref 8–23)
CALCIUM: 8.5 mg/dL — AB (ref 8.9–10.3)
CHLORIDE: 107 mmol/L (ref 98–111)
CO2: 25 mmol/L (ref 22–32)
CREATININE: 1.09 mg/dL (ref 0.61–1.24)
GFR calc non Af Amer: >60 mL/min (ref >60)
GLUCOSE: 174 mg/dL — AB (ref 70–99)
Potassium: 3.9 mmol/L (ref 3.5–5.1)
SODIUM: 141 mmol/L (ref 135–145)
Total Bilirubin: 0.7 mg/dL (ref 0.3–1.2)
Total Protein: 6.1 g/dL — ABNORMAL LOW (ref 6.5–8.1)

## 2017-12-27 LAB — POC OCCULT BLOOD, ED: Fecal Occult Bld: POSITIVE — AB

## 2017-12-27 LAB — HEMOGLOBIN AND HEMATOCRIT, BLOOD
HCT: 35.1 % — ABNORMAL LOW (ref 39.0–52.0)
Hemoglobin: 11.3 g/dL — ABNORMAL LOW (ref 13.0–17.0)

## 2017-12-27 LAB — TROPONIN I

## 2017-12-27 LAB — PROTIME-INR
INR: 1.14
Prothrombin Time: 14.5 seconds (ref 11.4–15.2)

## 2017-12-27 MED ORDER — ALBUTEROL SULFATE (2.5 MG/3ML) 0.083% IN NEBU: 2.5000 mg | INHALATION_SOLUTION | Freq: Four times a day (QID) | RESPIRATORY_TRACT | Status: DC | PRN

## 2017-12-27 MED ORDER — ACETAMINOPHEN 325 MG PO TABS: 650.0000 mg | ORAL_TABLET | Freq: Four times a day (QID) | ORAL | Status: DC | PRN

## 2017-12-27 MED ORDER — ONDANSETRON HCL 4 MG PO TABS: 4.0000 mg | ORAL_TABLET | Freq: Four times a day (QID) | ORAL | Status: DC | PRN

## 2017-12-27 MED ORDER — PEG 3350-KCL-NA BICARB-NACL 420 G PO SOLR
4000.0000 mL | Freq: Once | ORAL | Status: AC
  Administered 2017-12-28: 4000 mL via ORAL
  Filled 2017-12-27: qty 4000

## 2017-12-27 MED ORDER — SODIUM CHLORIDE 0.9% FLUSH
3.0000 mL | Freq: Two times a day (BID) | INTRAVENOUS | Status: DC
  Administered 2017-12-27 – 2017-12-31 (×8): 3 mL via INTRAVENOUS

## 2017-12-27 MED ORDER — ONDANSETRON HCL 4 MG/2ML IJ SOLN
4.0000 mg | Freq: Four times a day (QID) | INTRAMUSCULAR | Status: DC | PRN
  Administered 2018-01-01: 4 mg via INTRAVENOUS
  Filled 2017-12-27: qty 2

## 2017-12-27 MED ORDER — SODIUM CHLORIDE 0.9 % IV SOLN
250.0000 mL | INTRAVENOUS | Status: DC | PRN
  Administered 2018-01-01 (×2): via INTRAVENOUS

## 2017-12-27 MED ORDER — SODIUM CHLORIDE 0.9 % IV BOLUS
1000.0000 mL | Freq: Once | INTRAVENOUS | Status: AC
  Administered 2017-12-27: 1000 mL via INTRAVENOUS

## 2017-12-27 MED ORDER — TIOTROPIUM BROMIDE MONOHYDRATE 18 MCG IN CAPS
18.0000 ug | ORAL_CAPSULE | Freq: Every day | RESPIRATORY_TRACT | Status: DC
  Administered 2017-12-29 – 2017-12-31 (×3): 18 ug via RESPIRATORY_TRACT
  Filled 2017-12-27 (×2): qty 5

## 2017-12-27 MED ORDER — ACETAMINOPHEN 650 MG RE SUPP: 650.0000 mg | Freq: Four times a day (QID) | RECTAL | Status: DC | PRN

## 2017-12-27 MED ORDER — SODIUM CHLORIDE 0.9% FLUSH: 3.0000 mL | INTRAVENOUS | Status: DC | PRN

## 2017-12-27 NOTE — ED Provider Notes (Signed)
[facility] EMERGENCY DEPARTMENT Provider Note   CSN: [id number] Arrival date & time: 12/27/17  1014     History   Chief Complaint Chief Complaint  Patient presents with  . Loss of Consciousness  . Rectal Bleeding    HPI Randy Russo is a 77 y.o. male.  He is brought in by EMS after an episode of rectal bleeding and syncope/presyncope.  He said he went to the bathroom this morning to move his bowels and began to have liquid bowel movements.  He felt acutely weak and did not feel like he can get himself off the toilet.  He was dizzy and lightheaded.  Per EMS his wife said he might of passed out.  He denies falling to the floor and no chest pain or shortness of breath.  EMS found him to be hypotensive and gave him a fluid bolus with improvement in blood pressure on transport.  Patient denies any abdominal pain and has never had rectal bleeding before.  He denies any complaints right now other than feeling a little weak.  Not on anti-anticoagulation.  The history is provided by the patient.  Rectal Bleeding  Quality:  Bright red Amount:  Copious Chronicity:  New Context: diarrhea   Context: not constipation   Similar prior episodes: no   Relieved by:  None tried Worsened by:  Nothing Ineffective treatments:  None tried Associated symptoms: dizziness, light-headedness and loss of consciousness   Associated symptoms: no abdominal pain, no epistaxis, no fever, no hematemesis, no recent illness and no vomiting   Risk factors: no anticoagulant use     Past Medical History:  Diagnosis Date  . Arthritis   . COPD (chronic obstructive pulmonary disease) ([place])   . Hypertension     There are no active problems to display for this patient.   Past Surgical History:  Procedure Laterality Date  . CATARACT EXTRACTION W/PHACO Left 11/16/2012   Procedure: CATARACT EXTRACTION PHACO AND INTRAOCULAR LENS PLACEMENT (IOC);  Surgeon: Tonny Branch, MD;  Location: [facility];  Service: Ophthalmology;   Laterality: Left;  CDE: 13.66  . CATARACT EXTRACTION W/PHACO Right 01/18/2013   Procedure: CATARACT EXTRACTION PHACO AND INTRAOCULAR LENS PLACEMENT (IOC);  Surgeon: Tonny Branch, MD;  Location: [facility];  Service: Ophthalmology;  Laterality: Right;  CDE:8.79  . CERVICAL FUSION  4 yrs ago  . JOINT REPLACEMENT Left 5 yrs ago   knee  . right hip replacement Right 3 yrs ago        Home Medications    Prior to Admission medications   Medication Sig Start Date End Date Taking? Authorizing Provider  albuterol (PROVENTIL) (2.5 MG/3ML) 0.083% nebulizer solution Take 2.5 mg by nebulization every 6 (six) hours as needed for wheezing or shortness of breath.    [provider]  tiotropium (SPIRIVA) 18 MCG inhalation capsule Place 18 mcg into inhaler and inhale daily.    [provider]  Travoprost, BAK Free, (TRAVATAN) 0.004 % SOLN ophthalmic solution Place 1 drop into both eyes at bedtime.    [provider]    Family History History reviewed. No pertinent family history.  Social History Social History   Tobacco Use  . Smoking status: Light Tobacco Smoker    Types: Cigars  Substance Use Topics  . Alcohol use: No  . Drug use: No     Allergies   Patient has no known allergies.   Review of Systems Review of Systems  Constitutional: Negative for fever.  HENT: Negative for  nosebleeds and sore throat.   Eyes: Negative for visual disturbance.  Respiratory: Negative for shortness of breath.   Cardiovascular: Negative for chest pain.  Gastrointestinal: Positive for anal bleeding, diarrhea and hematochezia. Negative for abdominal pain, hematemesis and vomiting.  Genitourinary: Negative for dysuria.  Musculoskeletal: Negative for neck pain.  Skin: Negative for rash.  Neurological: Positive for dizziness, loss of consciousness and light-headedness.     Physical Exam Updated Vital Signs BP 111/60 (BP Location: Left Arm)   Pulse 87   Temp 97.6 F (36.4 C)  (Oral)   Resp 16   Ht 6' (1.829 m)   Wt 125 kg   SpO2 97%   BMI 37.37 kg/m   Physical Exam  Constitutional: He appears well-developed and well-nourished.  HENT:  Head: Normocephalic and atraumatic.  Eyes: Conjunctivae are normal.  Neck: Neck supple.  Cardiovascular: Normal rate and regular rhythm.  No murmur heard. Pulmonary/Chest: Effort normal and breath sounds normal. No respiratory distress.  Abdominal: Soft. There is no tenderness.  Genitourinary: Rectal exam shows guaiac positive stool. Rectal exam shows no external hemorrhoid, no mass and no tenderness.  Genitourinary Comments: Rectal exam no masses or tenderness red blood in vault.  Guaiac pending.  Musculoskeletal: He exhibits no edema or deformity.  Neurological: He is alert.  Skin: Skin is warm and dry.  Psychiatric: He has a normal mood and affect.  Nursing note and vitals reviewed.    ED Treatments / Results  Labs (all labs ordered are listed, but only abnormal results are displayed) Labs Reviewed  COMPREHENSIVE METABOLIC PANEL - Abnormal; Notable for the following components:      Result Value   Glucose, Bld 174 (*)    Calcium 8.5 (*)    Total Protein 6.1 (*)    Albumin 3.1 (*)    All other components within normal limits  CBC - Abnormal; Notable for the following components:   RBC 3.56 (*)    Hemoglobin 11.1 (*)    HCT 34.6 (*)    All other components within normal limits  POC OCCULT BLOOD, ED - Abnormal; Notable for the following components:   Fecal Occult Bld POSITIVE (*)    All other components within normal limits  PROTIME-INR  TROPONIN I  TYPE AND SCREEN    EKG None  Radiology No results found.  Procedures Procedures (including critical care time)  Medications Ordered in ED Medications  sodium chloride 0.9 % bolus 1,000 mL (has no administration in time range)     Initial Impression / Assessment and Plan / ED Course  I have reviewed the triage vital signs and the nursing  notes.  Pertinent labs & imaging results that were available during my care of the patient were reviewed by me and considered in my medical decision making (see chart for details).  Clinical Course as of Dec 28 1818  Sat Dec 27, 2017  1049 Patient here with acute onset of rectal bleeding and syncope/presyncope.  Was hypotensive at the scene.  Currently denies any complaints and feels almost back to baseline.  Michela Pitcher he has had a colonoscopy a few years ago was told everything was fine he does not recall any polyps or diverticulosis.  He has gross blood on rectal exam with no obvious hemorrhoids or masses felt.  His initial labs are starting to come back and his hemoglobin is 11.1.   [MB]  3419 Patient's blood pressure still in the low 100s.  His baseline is usually in the 130-140s.  We attempted to do orthostatics but he was dizzy lightheaded when he stood up.    [MB]  1431 Hemoglobin(!): 11.1 [MB]  1433 Discussed with Dr. Laural Golden from GI who recommends clear liquids and likely colonoscopy in the morning.   [MB]  1453 Discussed with Dr. Wynetta Emery from the hospitalist service who will admit him   [MB]  1518 ECG is normal sinus rhythm rate of 78 no acute ST-T changes.   [MB]    Clinical Course User Index [MB] Hayden Rasmussen, MD     Final Clinical Impressions(s) / ED Diagnoses   Final diagnoses:  Lower GI bleed  Syncope and collapse    ED Discharge Orders    None       Hayden Rasmussen, MD 12/27/17 Vernelle Emerald

## 2017-12-27 NOTE — Consult Note (Addendum)
Referring Provider: Cristal Ford, DO Primary Care Physician:  Sharilyn Sites, MD Primary Gastroenterologist:  Dr. Laural Golden  Reason for Consultation:    Rectal bleeding.  HPI:   Patient is 77 year old African-American male who passed large amount of what he calls liquid stool the first time morning.  He went back the second time and passed bright red blood per rectum.  He states it was large amount.  He recalls he became diaphoretic and felt very weak.  He was not able to stand out.  He had presyncope.  9 1 was called and patient was brought to emergency room by EMS.  He was noted to be hypotensive and given IV fluids while enroute to emergency room. Patient was evaluated by Dr. Aletta Edouard and treated with IV fluids.  Rectal examination revealed heme positive stool but no gross blood.  Patient's H&H was 11.1 and 34.6 with platelet count of 191K and INR of 1.14.  BUN was 15 and creatinine 1.09. She was admitted to telemetry bed. She and feels fine.  He has not had any more bloody bowel movements.  He noticed some cramping while he was having bowel movement but he has not had any abdominal pain.  He did not experience nausea vomiting or hematemesis.  He states he has had a good appetite and his weight has been stable.  He takes full dose aspirin daily for knee pain.  He does not take other OTC NSAIDs.  He has been taking aspirin on his own.  He recalls he had colonoscopy several years ago when he was living in [place].  He is married.  He has 2 daughters and 2 sons.  1 of his daughters is diabetic.  He is retired from [employer] where he worked for 30 years.  He used to smoke cigarettes but quit over 20 years ago.  He drinks alcohol occasionally. His father died of intracranial aneurysm in his 65s.  Mother had end-stage renal disease.  One sister died due to intracranial aneurysm in her 67s. One brother died in his 41s due to toxoids.  He has older brother and sister  living.   Past Medical History:  Diagnosis Date  . Arthritis   . COPD (chronic obstructive pulmonary disease) ([place])   . Hypertension         Obesity.  Past Surgical History:  Procedure Laterality Date  . CATARACT EXTRACTION W/PHACO Left 11/16/2012   Procedure: CATARACT EXTRACTION PHACO AND INTRAOCULAR LENS PLACEMENT (IOC);  Surgeon: Tonny Branch, MD;  Location: [facility];  Service: Ophthalmology;  Laterality: Left;  CDE: 13.66  . CATARACT EXTRACTION W/PHACO Right 01/18/2013   Procedure: CATARACT EXTRACTION PHACO AND INTRAOCULAR LENS PLACEMENT (IOC);  Surgeon: Tonny Branch, MD;  Location: [facility];  Service: Ophthalmology;  Laterality: Right;  CDE:8.79  . CERVICAL FUSION  4 yrs ago  . JOINT REPLACEMENT Left 5 yrs ago   knee  . right hip replacement Right 3 yrs ago    Prior to Admission medications   Medication Sig Start Date End Date Taking? Authorizing Provider  albuterol (PROVENTIL) (2.5 MG/3ML) 0.083% nebulizer solution Take 2.5 mg by nebulization every 6 (six) hours as needed for wheezing or shortness of breath.   Yes [provider]  aspirin EC 325 MG tablet Take 325 mg by mouth daily.   Yes [provider]  cholecalciferol (VITAMIN D) 400 units TABS tablet Take 400 Units by mouth.   Yes [provider]  lisinopril (PRINIVIL,ZESTRIL) 20 MG  tablet Take 20 mg by mouth daily.   Yes [provider]  tiotropium (SPIRIVA) 18 MCG inhalation capsule Place 18 mcg into inhaler and inhale daily.   Yes [provider]    Current Facility-Administered Medications  Medication Dose Route Frequency Provider Last Rate Last Dose  . 0.9 %  sodium chloride infusion  250 mL Intravenous PRN Cristal Ford, DO      . acetaminophen (TYLENOL) tablet 650 mg  650 mg Oral Q6H PRN Cristal Ford, DO       Or  . acetaminophen (TYLENOL) suppository 650 mg  650 mg Rectal Q6H PRN Mikhail, Velta Addison, DO      . albuterol (PROVENTIL) (2.5 MG/3ML) 0.083% nebulizer solution  2.5 mg  2.5 mg Nebulization Q6H PRN Mikhail, Velta Addison, DO      . ondansetron (ZOFRAN) tablet 4 mg  4 mg Oral Q6H PRN Cristal Ford, DO       Or  . ondansetron (ZOFRAN) injection 4 mg  4 mg Intravenous Q6H PRN Mikhail, Maryann, DO      . sodium chloride flush (NS) 0.9 % injection 3 mL  3 mL Intravenous Q12H Mikhail, Maryann, DO      . sodium chloride flush (NS) 0.9 % injection 3 mL  3 mL Intravenous PRN Cristal Ford, DO      . [START ON 12/28/2017] tiotropium (SPIRIVA) inhalation capsule 18 mcg  18 mcg Inhalation Daily Mikhail, Velta Addison, DO        Allergies as of 12/27/2017  . (No Known Allergies)    History reviewed. No pertinent family history.  Social History   Socioeconomic History  . Marital status: Married    Spouse name: Not on file  . Number of children: Not on file  . Years of education: Not on file  . Highest education level: Not on file  Occupational History  . Not on file  Social Needs  . Financial resource strain: Not on file  . Food insecurity:    Worry: Not on file    Inability: Not on file  . Transportation needs:    Medical: Not on file    Non-medical: Not on file  Tobacco Use  . Smoking status: Light Tobacco Smoker    Types: Cigars  Substance and Sexual Activity  . Alcohol use: No  . Drug use: No  . Sexual activity: Not on file  Lifestyle  . Physical activity:    Days per week: Not on file    Minutes per session: Not on file  . Stress: Not on file  Relationships  . Social connections:    Talks on phone: Not on file    Gets together: Not on file    Attends religious service: Not on file    Active member of club or organization: Not on file    Attends meetings of clubs or organizations: Not on file    Relationship status: Not on file  . Intimate partner violence:    Fear of current or ex partner: Not on file    Emotionally abused: Not on file    Physically abused: Not on file    Forced sexual activity: Not on file  Other Topics Concern  .  Not on file  Social History Narrative  . Not on file    Physical Exam: Temp:  [97.6 F (36.4 C)-98.9 F (37.2 C)] 98.9 F (37.2 C) (10/05 1600) Pulse Rate:  [69-96] 79 (10/05 1600) Resp:  [10-44] 14 (10/05 1230) BP: (95-127)/(56-94) 108/94 (10/05 1600)  SpO2:  [92 %-100 %] 100 % (10/05 1600) Weight:  [427 kg] 125 kg (10/05 1016) Last BM Date: 12/27/17 General:   Alert,  Well-developed, well-nourished, pleasant and cooperative in NAD Head:  Normocephalic and atraumatic. Eyes:  Sclera clear, no icterus.   Conjunctiva pink. Mouth:  Oropharyngeal mucosa is normal.  Neck:  Supple; no masses or thyromegaly. Lungs:  Clear throughout to auscultation.   No wheezes, crackles, or rhonchi. No acute distress. Heart:  Regular rate and rhythm; no murmurs, clicks, rubs,  or gallops. Abdomen:  Soft, nontender and nondistended. No masses, hepatosplenomegaly or hernias noted. Normal bowel sounds, without guarding, and without rebound.   Rectal:  Deferred until time of colonoscopy.   Extremities:  Without clubbing or edema. Neurologic:  Alert and  oriented x4;  grossly normal neurologically. Skin:  Intact without significant lesions or rashes.   Lab Results: Recent Labs    12/27/17 1034 12/27/17 1602  WBC 8.1  --   HGB 11.1* 11.3*  HCT 34.6* 35.1*  PLT 191  --    BMET Recent Labs    12/27/17 1034  NA 141  K 3.9  CL 107  CO2 25  GLUCOSE 174*  BUN 15  CREATININE 1.09  CALCIUM 8.5*   LFT Recent Labs    12/27/17 1034  PROT 6.1*  ALBUMIN 3.1*  AST 17  ALT 18  ALKPHOS 40  BILITOT 0.7   PT/INR Recent Labs    12/27/17 1034  LABPROT 14.5  INR 1.14     Assessment;  Patient is a 77 year old Afro-American male who presents with large volume painless hematochezia associated with presyncope/syncope due to hypotension or vasovagal phenomenon.  He is hemodynamically stable.  Hemoglobin on admission was 11.1 and now it is 11.3.  It appears he has stopped bleeding. Suspect  colonic diverticular bleed in his presentation.  Is on full dose aspirin which increases the risk of diverticular bleed. Patient's last colonoscopy was well over 20 years ago.  Recommendations;  Diagnostic colonoscopy to be performed tomorrow. Would be prepped with GoLYTELY in a.m. She is advised to talk with PCP whether or not he should be taking aspirin.   LOS: 0 days   Randy Russo  12/27/2017, 6:51 PM

## 2017-12-27 NOTE — H&P (Signed)
[facility] History and Physical  RONEY YOUTZ YIR:[id number] DOB: 1940-08-02 DOA: 12/27/2017  PCP: Sharilyn Sites, MD  Patient coming from: home  Chief Complaint: Passing out, Blood bowel movement  HPI: Randy Russo is a 77 y.o. male with a medical history of hypertension, COPD, who presented to the emergency department with complaints of almost passing out as well as a GI bleed.  It seems that patient had gotten up this morning to use the bathroom and had a bowel movement.  He was unable to get up from the commode, had fallen backwards at which point his eyes rolled back into his head and he had some shaking.  Was reported by patient's wife.  Patient does not recall these events.  Patient's wife does not note any bloody bowel movement.  Per EMS patient had a large bloody bowel movement.  Patient does feel weak and dizzy at this time.  Denies current chest pain, shortness of breath, abdominal pain, nausea or vomiting, constipation, problems with urination, numbness or tingling, recent illness or travel.  ED Course: Presented with near syncope and hypotension.  Patient was given IV fluids.  FOBT was positive.  GI was called.,  TR[facility] called for admission.  Review of Systems:  All other systems reviewed and are negative.   Past Medical History:  Diagnosis Date  . Arthritis   . COPD (chronic obstructive pulmonary disease) ([place])   . Hypertension     Past Surgical History:  Procedure Laterality Date  . CATARACT EXTRACTION W/PHACO Left 11/16/2012   Procedure: CATARACT EXTRACTION PHACO AND INTRAOCULAR LENS PLACEMENT (IOC);  Surgeon: Tonny Branch, MD;  Location: [facility];  Service: Ophthalmology;  Laterality: Left;  CDE: 13.66  . CATARACT EXTRACTION W/PHACO Right 01/18/2013   Procedure: CATARACT EXTRACTION PHACO AND INTRAOCULAR LENS PLACEMENT (IOC);  Surgeon: Tonny Branch, MD;  Location: [facility];  Service: Ophthalmology;  Laterality: Right;  CDE:8.79  . CERVICAL FUSION  4 yrs ago  . JOINT  REPLACEMENT Left 5 yrs ago   knee  . right hip replacement Right 3 yrs ago    Social History:  reports that he has been smoking cigars. He does not have any smokeless tobacco history on file. He reports that he does not drink alcohol or use drugs.  No Known Allergies  Family history HTN  Prior to Admission medications   Medication Sig Start Date End Date Taking? Authorizing Provider  albuterol (PROVENTIL) (2.5 MG/3ML) 0.083% nebulizer solution Take 2.5 mg by nebulization every 6 (six) hours as needed for wheezing or shortness of breath.   Yes [provider]  aspirin EC 325 MG tablet Take 325 mg by mouth daily.   Yes [provider]  cholecalciferol (VITAMIN D) 400 units TABS tablet Take 400 Units by mouth.   Yes [provider]  lisinopril (PRINIVIL,ZESTRIL) 20 MG tablet Take 20 mg by mouth daily.   Yes [provider]  tiotropium (SPIRIVA) 18 MCG inhalation capsule Place 18 mcg into inhaler and inhale daily.   Yes [provider]    Physical Exam: Vitals:   12/27/17 1330 12/27/17 1430  BP: 113/66 (!) 100/57  Pulse:    Resp:    Temp:    SpO2:       General: Well developed, well nourished, NAD, appears stated age  HEENT: NCAT, PERRLA, EOMI, Anicteic Sclera, mucous membranes moist.  Edentulous  Neck: Supple, no JVD, no masses  Cardiovascular: S1 S2 auscultated, no rubs, murmurs or gallops. Regular rate and rhythm.  Respiratory: Clear to auscultation bilaterally with equal chest rise  Abdomen: Soft, obese, nontender, nondistended, + bowel sounds  Extremities: warm dry without cyanosis clubbing or edema  Neuro: AAOx3, cranial nerves grossly intact. Strength 5/5 in patient's upper and lower extremities bilaterally  Skin: Without rashes exudates or nodules  Psych: Normal affect and demeanor with intact judgement and insight  Labs on Admission: I have personally reviewed following labs and imaging studies CBC: Recent Labs    Lab 12/27/17 1034  WBC 8.1  HGB 11.1*  HCT 34.6*  MCV 97.2  PLT 932   Basic Metabolic Panel: Recent Labs  Lab 12/27/17 1034  NA 141  K 3.9  CL 107  CO2 25  GLUCOSE 174*  BUN 15  CREATININE 1.09  CALCIUM 8.5*   GFR: Estimated Creatinine Clearance: 77.5 mL/min (by C-G formula based on SCr of 1.09 mg/dL). Liver Function Tests: Recent Labs  Lab 12/27/17 1034  AST 17  ALT 18  ALKPHOS 40  BILITOT 0.7  PROT 6.1*  ALBUMIN 3.1*   No results for input(s): LIPASE, AMYLASE in the last 168 hours. No results for input(s): AMMONIA in the last 168 hours. Coagulation Profile: Recent Labs  Lab 12/27/17 1034  INR 1.14   Cardiac Enzymes: Recent Labs  Lab 12/27/17 1034  TROPONINI <0.03   BNP (last 3 results) No results for input(s): PROBNP in the last 8760 hours. HbA1C: No results for input(s): HGBA1C in the last 72 hours. CBG: No results for input(s): GLUCAP in the last 168 hours. Lipid Profile: No results for input(s): CHOL, HDL, LDLCALC, TRIG, CHOLHDL, LDLDIRECT in the last 72 hours. Thyroid Function Tests: No results for input(s): TSH, T4TOTAL, FREET4, T3FREE, THYROIDAB in the last 72 hours. Anemia Panel: No results for input(s): VITAMINB12, FOLATE, FERRITIN, TIBC, IRON, RETICCTPCT in the last 72 hours. Urine analysis:    Component Value Date/Time   COLORURINE YELLOW 05/09/2017 1705   APPEARANCEUR HAZY (A) 05/09/2017 1705   LABSPEC 1.017 05/09/2017 1705   PHURINE 5.0 05/09/2017 1705   GLUCOSEU NEGATIVE 05/09/2017 1705   HGBUR MODERATE (A) 05/09/2017 1705   BILIRUBINUR NEGATIVE 05/09/2017 1705   KETONESUR NEGATIVE 05/09/2017 1705   PROTEINUR NEGATIVE 05/09/2017 1705   UROBILINOGEN 0.2 09/13/2010 0850   NITRITE NEGATIVE 05/09/2017 1705   LEUKOCYTESUR SMALL (A) 05/09/2017 1705   Sepsis Labs: @LABRCNTIP (procalcitonin:4,lacticidven:4) )No results found for this or any previous visit (from the past 240 hour(s)).   Radiological Exams on Admission: No results  found.  EKG: None  Assessment/Plan  GI bleed/ Hematochezia -presented with hypotension, near syncopal event -Hemoglobin currently 11.1. Will continue to monitor H/H every 12 hours. -Reviewed chart, hemoglobin has ranged from 9-12 from 2012 - 2014. -FOBT + -Gastroenterology consulted and appreciated, recommended clear liquids and possible colonoscopy on 10/6 -Continue to monitor CBC  Near syncope -likely secondary to GI bleed/ hypotension -conduct neuro checks -currently no neurological deficits  Hypotension -improved with IV resuscitation -Will continue to monitor   Essential hypertension -presented with hypotension, will hold lisinopril and monitor   COPD -Currently stable. No wheezing on exam -continue Spiriva and albuterol PRN  DVT prophylaxis: SCDs  Code Status: Full  Family Communication: Family at bedside. Admission, patients condition and plan of care including tests being ordered have been discussed with the patient and family who indicate understanding and agree with the plan and Code Status.  Disposition Plan: home   Consults called: Gastroenterology   Admission status: Observation   Time spent: 70 minutes  Yasaman Kolek D.O.  [facility]  Between 7am to 7pm - Please see pager noted on [email or web address]  After 7pm go to [email or web address] 12/27/2017, 3:20 PM

## 2017-12-27 NOTE — ED Triage Notes (Signed)
Pt brought in by ems from home after getting up to use the bathroom and having a copious amt of rectal bleeding per wife.  Pt had a syncopal episode in bathroom and was found to be hypotensive on ems arrival.  Given 871ml of fluid by ems with improvement in blood pressure.

## 2017-12-28 ENCOUNTER — Encounter (HOSPITAL_COMMUNITY)
Admission: EM | Disposition: A | Discharge status: Home Health | Payer: Self-pay | Source: Home / Self Care | Attending: Internal Medicine

## 2017-12-28 ENCOUNTER — Other Ambulatory Visit: Payer: Self-pay

## 2017-12-28 ENCOUNTER — Encounter (HOSPITAL_COMMUNITY): Payer: Self-pay | Admitting: *Deleted

## 2017-12-28 DIAGNOSIS — K5730 Diverticulosis of large intestine without perforation or abscess without bleeding: Secondary | ICD-10-CM

## 2017-12-28 DIAGNOSIS — D649 Anemia, unspecified: Secondary | ICD-10-CM

## 2017-12-28 DIAGNOSIS — K644 Residual hemorrhoidal skin tags: Secondary | ICD-10-CM

## 2017-12-28 HISTORY — PX: COLONOSCOPY: SHX5424

## 2017-12-28 LAB — CBC
HCT: 30.8 % — ABNORMAL LOW (ref 39.0–52.0)
HEMATOCRIT: 26.6 % — AB (ref 39.0–52.0)
HEMOGLOBIN: 9.9 g/dL — AB (ref 13.0–17.0)
HEMOGLOBIN: 8.5 g/dL — AB (ref 13.0–17.0)
MCH: 31 pg (ref 26.0–34.0)
MCH: 30.9 pg (ref 26.0–34.0)
MCHC: 32.1 g/dL (ref 30.0–36.0)
MCHC: 32 g/dL (ref 30.0–36.0)
MCV: 96.6 fL (ref 78.0–100.0)
MCV: 96.7 fL (ref 78.0–100.0)
Platelets: 195 10*3/uL (ref 150–400)
Platelets: 194 10*3/uL (ref 150–400)
RBC: 3.19 MIL/uL — AB (ref 4.22–5.81)
RBC: 2.75 MIL/uL — ABNORMAL LOW (ref 4.22–5.81)
RDW: 13.7 % (ref 11.5–15.5)
RDW: 14 % (ref 11.5–15.5)
WBC: 7.8 10*3/uL (ref 4.0–10.5)
WBC: 12 10*3/uL — ABNORMAL HIGH (ref 4.0–10.5)

## 2017-12-28 LAB — ABO/RH: ABO/RH(D): B POS

## 2017-12-28 LAB — BASIC METABOLIC PANEL
ANION GAP: 4 — AB (ref 5–15)
BUN: 15 mg/dL (ref 8–23)
CALCIUM: 8.4 mg/dL — AB (ref 8.9–10.3)
CO2: 26 mmol/L (ref 22–32)
Chloride: 109 mmol/L (ref 98–111)
Creatinine, Ser: 0.96 mg/dL (ref 0.61–1.24)
Glucose, Bld: 110 mg/dL — ABNORMAL HIGH (ref 70–99)
Potassium: 4 mmol/L (ref 3.5–5.1)
SODIUM: 139 mmol/L (ref 135–145)

## 2017-12-28 LAB — HEMOGLOBIN AND HEMATOCRIT, BLOOD
HEMATOCRIT: 30.8 % — AB (ref 39.0–52.0)
HEMOGLOBIN: 9.8 g/dL — AB (ref 13.0–17.0)

## 2017-12-28 LAB — GLUCOSE, CAPILLARY: GLUCOSE-CAPILLARY: 130 mg/dL — AB (ref 70–99)

## 2017-12-28 LAB — COMPREHENSIVE METABOLIC PANEL
ALK PHOS: 38 U/L (ref 38–126)
ALT: 18 U/L (ref 0–44)
AST: 19 U/L (ref 15–41)
Albumin: 2.9 g/dL — ABNORMAL LOW (ref 3.5–5.0)
Anion gap: 5 (ref 5–15)
BILIRUBIN TOTAL: 0.2 mg/dL — AB (ref 0.3–1.2)
BUN: 17 mg/dL (ref 8–23)
CHLORIDE: 110 mmol/L (ref 98–111)
CO2: 23 mmol/L (ref 22–32)
Calcium: 8.1 mg/dL — ABNORMAL LOW (ref 8.9–10.3)
Creatinine, Ser: 1 mg/dL (ref 0.61–1.24)
Glucose, Bld: 147 mg/dL — ABNORMAL HIGH (ref 70–99)
POTASSIUM: 3.4 mmol/L — AB (ref 3.5–5.1)
Sodium: 138 mmol/L (ref 135–145)
Total Protein: 5.4 g/dL — ABNORMAL LOW (ref 6.5–8.1)

## 2017-12-28 LAB — PREPARE RBC (CROSSMATCH)

## 2017-12-28 SURGERY — COLONOSCOPY
Anesthesia: Moderate Sedation

## 2017-12-28 MED ORDER — MIDAZOLAM HCL 5 MG/5ML IJ SOLN
INTRAMUSCULAR | Status: DC | PRN
  Administered 2017-12-28 (×2): 1 mg via INTRAVENOUS
  Administered 2017-12-28: 2 mg via INTRAVENOUS

## 2017-12-28 MED ORDER — POTASSIUM CHLORIDE 10 MEQ/100ML IV SOLN
10.0000 meq | INTRAVENOUS | Status: AC
  Administered 2017-12-29 (×2): 10 meq via INTRAVENOUS
  Filled 2017-12-28 (×2): qty 100

## 2017-12-28 MED ORDER — MIDAZOLAM HCL 5 MG/5ML IJ SOLN
INTRAMUSCULAR | Status: AC
  Filled 2017-12-28: qty 10

## 2017-12-28 MED ORDER — STERILE WATER FOR IRRIGATION IR SOLN
Status: DC | PRN
  Administered 2017-12-28: 2.5 mL

## 2017-12-28 MED ORDER — SODIUM CHLORIDE 0.9 % IV BOLUS
500.0000 mL | Freq: Once | INTRAVENOUS | Status: AC
  Administered 2017-12-28: 500 mL via INTRAVENOUS

## 2017-12-28 MED ORDER — SODIUM CHLORIDE 0.9 % IV BOLUS
1000.0000 mL | Freq: Once | INTRAVENOUS | Status: AC
  Administered 2017-12-28: 1000 mL via INTRAVENOUS

## 2017-12-28 MED ORDER — SODIUM CHLORIDE 0.9 % IV SOLN: INTRAVENOUS | Status: DC

## 2017-12-28 MED ORDER — MEPERIDINE HCL 50 MG/ML IJ SOLN
INTRAMUSCULAR | Status: AC
  Filled 2017-12-28: qty 1

## 2017-12-28 MED ORDER — SODIUM CHLORIDE 0.9% IV SOLUTION
Freq: Once | INTRAVENOUS | Status: AC
  Administered 2017-12-29: via INTRAVENOUS

## 2017-12-28 MED ORDER — MEPERIDINE HCL 50 MG/ML IJ SOLN
INTRAMUSCULAR | Status: DC | PRN
  Administered 2017-12-28: 20 mg via INTRAVENOUS

## 2017-12-28 NOTE — Op Note (Signed)
[facility] Patient Name: Randy Russo Procedure Date: 12/28/2017 10:49 AM MRN: [id number] Date of Birth: 07-30-1940 Attending MD: Hildred Laser , MD CSN: [id number] Age: 77 Admit Type: Inpatient Procedure:                Colonoscopy Indications:              Rectal bleeding Providers:                Hildred Laser, MD, Lurline Del, RN, Nelma Rothman,                            Technician Referring MD:             Cristal Ford, DO Medicines:                Meperidine 20 mg IV, Midazolam 4 mg IV Complications:            No immediate complications. Estimated Blood Loss:     Estimated blood loss was minimal. Procedure:                Pre-Anesthesia Assessment:                           - Prior to the procedure, a History and Physical                            was performed, and patient medications and                            allergies were reviewed. The patient's tolerance of                            previous anesthesia was also reviewed. The risks                            and benefits of the procedure and the sedation                            options and risks were discussed with the patient.                            All questions were answered, and informed consent                            was obtained. Prior Anticoagulants: The patient                            last took aspirin 2 days prior to the procedure.                            ASA Grade Assessment: II - A patient with mild                            systemic disease. After reviewing the risks and  benefits, the patient was deemed in satisfactory                            condition to undergo the procedure.                           After obtaining informed consent, the colonoscope                            was passed under direct vision. Throughout the                            procedure, the patient's blood pressure, pulse, and                            oxygen saturations were  monitored continuously. The                            PCF-H190DL ([id number]) scope was introduced through                            the anus and advanced to the the cecum, identified                            by appendiceal orifice and ileocecal valve. The                            colonoscopy was performed without difficulty. The                            patient tolerated the procedure well. The quality                            of the bowel preparation was good. The ileocecal                            valve, appendiceal orifice, and rectum were                            photographed. Scope In: 11:12:10 AM Scope Out: 11:31:14 AM Scope Withdrawal Time: 0 hours 5 minutes 49 seconds  Total Procedure Duration: 0 hours 19 minutes 4 seconds  Findings:      The perianal and digital rectal examinations were normal.      Multiple small and large-mouthed diverticula were found in the sigmoid       colon, hepatic flexure, ascending colon and cecum.      External hemorrhoids were found during retroflexion. The hemorrhoids       were small. Impression:               - Diverticulosis in the sigmoid colon, at the                            hepatic flexure, in the ascending colon and in the  cecum.                           - External hemorrhoids.                           - No specimens collected.                           Comment: Colonic diverticular bleed. Moderate Sedation:      Moderate (conscious) sedation was administered by the endoscopy nurse       and supervised by the endoscopist. The following parameters were       monitored: oxygen saturation, heart rate, blood pressure, CO2       capnography and response to care. Total physician intraservice time was       26 minutes. Recommendation:           - Return patient to hospital ward for ongoing care.                           - Cardiac diet today.                           - Continue present medications.                            - No aspirin, ibuprofen, naproxen, or other                            non-steroidal anti-inflammatory drugs for 7 days.                           - No repeat colonoscopy. Procedure Code(s):        --- Professional ---                           [phone], Colonoscopy, flexible; diagnostic, including                            collection of specimen(s) by brushing or washing,                            when performed (separate procedure)                           G0500, Moderate sedation services provided by the                            same physician or other qualified health care                            professional performing a gastrointestinal                            endoscopic service that sedation supports,  requiring the presence of an independent trained                            observer to assist in the monitoring of the                            patient's level of consciousness and physiological                            status; initial 15 minutes of intra-service time;                            patient age 57 years or older (additional time may                            be reported with [phone], as appropriate)                           [phone], Moderate sedation services provided by the                            same physician or other qualified health care                            professional performing the diagnostic or                            therapeutic service that the sedation supports,                            requiring the presence of an independent trained                            observer to assist in the monitoring of the                            patient's level of consciousness and physiological                            status; each additional 15 minutes intraservice                            time (List separately in addition to code for                            primary service) Diagnosis  Code(s):        --- Professional ---                           K64.4, Residual hemorrhoidal skin tags                           K62.5, Hemorrhage of anus and rectum  K57.30, Diverticulosis of large intestine without                            perforation or abscess without bleeding CPT copyright 2017 [employer] Association. All rights reserved. The codes documented in this report are preliminary and upon coder review may  be revised to meet current compliance requirements. Hildred Laser, MD Hildred Laser, MD 12/28/2017 11:42:43 AM This report has been signed electronically. Number of Addenda: 0

## 2017-12-28 NOTE — Progress Notes (Signed)
Patient has had 4 more episodes of lage amounts of bright red blood per rectum.  BP after liter bolus 97/58.  MD notified via text page.

## 2017-12-28 NOTE — Progress Notes (Signed)
Pt has returned to room.  He is A&O and eating lunch.

## 2017-12-28 NOTE — Progress Notes (Signed)
PROGRESS NOTE    Randy Russo  OHY:[id number] DOB: 07/22/1940 DOA: 12/27/2017 PCP: Sharilyn Sites, MD   Brief Narrative:  HPI On 12/27/2017  Randy Russo is a 77 y.o. male with a medical history of hypertension, COPD, who presented to the emergency department with complaints of almost passing out as well as a GI bleed.  It seems that patient had gotten up this morning to use the bathroom and had a bowel movement.  He was unable to get up from the commode, had fallen backwards at which point his eyes rolled back into his head and he had some shaking.  Was reported by patient's wife.  Patient does not recall these events.  Patient's wife does not note any bloody bowel movement.  Per EMS patient had a large bloody bowel movement.  Patient does feel weak and dizzy at this time.  Denies current chest pain, shortness of breath, abdominal pain, nausea or vomiting, constipation, problems with urination, numbness or tingling, recent illness or travel.  Interim history Admitted for near syncope secondary to GI Bleed. GI consulted, s/p colonoscopy.  Assessment & Plan   GI bleed/ Hematochezia/ Diverticular bleed/ Acute on chronic normocytic anemia -presented with hypotension, near syncopal event -Hemoglobin on admission 11.1.  -Reviewed chart, hemoglobin has ranged from 9-12 from 2012 - 2014. 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 None with stigmata of bleed. Small external hemorrhoids. -hemoglobin currently 9.9 -Will continue to monitor CBC  Near syncope -likely secondary to GI bleed/ hypotension-vasovagal -currently no neurological deficits  Hypotension -improved with IV resuscitation -patient now normotensive   Essential hypertension -presented with hypotension, will hold lisinopril and monitor   COPD -Currently stable. No wheezing on  exam -continue Spiriva and albuterol PRN  Weakness -suspect secondary to anemia/GI bleed -Will consult PT   DVT Prophylaxis  SCDs  Code Status: Full  Family Communication: Wife at bedside.  Disposition Plan: Observation  Consultants Gastroenterology  Procedures  Colonoscopy  Antibiotics   Anti-infectives (From admission, onward)   None      Subjective:   [name] seen and examined today.  Patient feeling better today. No further complaints of dizziness. Continues to feel weak. Denies current chest pain, shortness of breath, abdominal pain, N/V.   Objective:   Vitals:   12/28/17 1105 12/28/17 1110 12/28/17 1115 12/28/17 1120  BP:  117/70 110/80 102/71  Pulse: 92 74 94 97  Resp: (!) 25 18 (!) 21 (!) 21  Temp:      TempSrc:      SpO2: 100% 100% 97% 100%  Weight:      Height:        Intake/Output Summary (Last 24 hours) at 12/28/2017 1142 Last data filed at 12/27/2017 1900 Gross per 24 hour  Intake 1000 ml  Output 100 ml  Net 900 ml   Filed Weights   12/27/17 1016  Weight: 125 kg    Exam  General: Well developed, well nourished, NAD, appears stated age  73: NCAT, mucous membranes moist. Edentulous  Neck: Supple  Cardiovascular: S1 S2 auscultated, RRR, no murmur  Respiratory: Clear to auscultation bilaterally with equal chest rise  Abdomen: Soft, nontender, nondistended, + bowel sounds  Extremities: warm dry without cyanosis clubbing or edema  Neuro: AAOx3, nonfocal  Psych: Normal affect and demeanor with intact judgement and insight   Data Reviewed: I have personally reviewed following labs and imaging  studies  CBC: Recent Labs  Lab 12/27/17 1034 12/27/17 1602 12/28/17 0346  WBC 8.1  --  7.8  HGB 11.1* 11.3* 9.9*  HCT 34.6* 35.1* 30.8*  MCV 97.2  --  96.6  PLT 191  --  161   Basic Metabolic Panel: Recent Labs  Lab 12/27/17 1034 12/28/17 0346  NA 141 139  K 3.9 4.0  CL 107 109  CO2 25 26  GLUCOSE 174* 110*  BUN 15  15  CREATININE 1.09 0.96  CALCIUM 8.5* 8.4*   GFR: Estimated Creatinine Clearance: 88 mL/min (by C-G formula based on SCr of 0.96 mg/dL). Liver Function Tests: Recent Labs  Lab 12/27/17 1034  AST 17  ALT 18  ALKPHOS 40  BILITOT 0.7  PROT 6.1*  ALBUMIN 3.1*   No results for input(s): LIPASE, AMYLASE in the last 168 hours. No results for input(s): AMMONIA in the last 168 hours. Coagulation Profile: Recent Labs  Lab 12/27/17 1034  INR 1.14   Cardiac Enzymes: Recent Labs  Lab 12/27/17 1034  TROPONINI <0.03   BNP (last 3 results) No results for input(s): PROBNP in the last 8760 hours. HbA1C: No results for input(s): HGBA1C in the last 72 hours. CBG: No results for input(s): GLUCAP in the last 168 hours. Lipid Profile: No results for input(s): CHOL, HDL, LDLCALC, TRIG, CHOLHDL, LDLDIRECT in the last 72 hours. Thyroid Function Tests: No results for input(s): TSH, T4TOTAL, FREET4, T3FREE, THYROIDAB in the last 72 hours. Anemia Panel: No results for input(s): VITAMINB12, FOLATE, FERRITIN, TIBC, IRON, RETICCTPCT in the last 72 hours. Urine analysis:    Component Value Date/Time   COLORURINE YELLOW 05/09/2017 1705   APPEARANCEUR HAZY (A) 05/09/2017 1705   LABSPEC 1.017 05/09/2017 1705   PHURINE 5.0 05/09/2017 1705   GLUCOSEU NEGATIVE 05/09/2017 1705   HGBUR MODERATE (A) 05/09/2017 1705   BILIRUBINUR NEGATIVE 05/09/2017 1705   KETONESUR NEGATIVE 05/09/2017 1705   PROTEINUR NEGATIVE 05/09/2017 1705   UROBILINOGEN 0.2 09/13/2010 0850   NITRITE NEGATIVE 05/09/2017 1705   LEUKOCYTESUR SMALL (A) 05/09/2017 1705   Sepsis Labs: @LABRCNTIP (procalcitonin:4,lacticidven:4)  )No results found for this or any previous visit (from the past 240 hour(s)).    Radiology Studies: No results found.   Scheduled Meds: . meperidine      . midazolam      . [MAR Hold] sodium chloride flush  3 mL Intravenous Q12H  . [MAR Hold] tiotropium  18 mcg Inhalation Daily   Continuous  Infusions: . [MAR Hold] sodium chloride       LOS: 0 days   Time Spent in minutes   30 minutes  Shereda Graw D.O. on 12/28/2017 at 11:42 AM  Between 7am to 7pm - Please see pager noted on [email or web address]  After 7pm go to [email or web address]  And look for the night coverage person covering for me after hours  [facility] Office  [phone]

## 2017-12-28 NOTE — Progress Notes (Signed)
First of 2 UPRBC started per MD order.  Report given to ICU nurse.  Daughter notified of transfer.  Patient moved to stepdown without incident.

## 2017-12-28 NOTE — Progress Notes (Signed)
Pt passed a large amount of bright red blood from rectum, no stool.

## 2017-12-28 NOTE — Significant Event (Addendum)
Mr. Scouten was admitted 12/27/17 with rectal bleeding and near-syncope, underwent colonoscopy today with diverticula noted but no stigmata of bleeding.   Painless rectal bleeding recurred this afternoon.   He was on toilet this evening, passing BRBPR, and suffered a syncopal episode.   He is cool and clammy now but vitals are normal and pt is alert, oriented, and answering questions appropriately. Reports acute lightheadedness immediately preceding the syncopal episode, but denies any chest pain or abdominal pain.   Denies abd pain and no N/V. Suspect recurrent diverticular bleeding.   Plan to give 1 liter bolus now, repeat CBC and chemistries, prepare 2 units RBC, continue cardiac monitoring.    Daughter updated at bedside.   Addendum: Hgb now 8.5, down from 11.1 on admission and 9.8 this afternoon. Given ongoing bleeding with syncope, and in anticipation of further drop, will transfuse one unit now.

## 2017-12-28 NOTE — Progress Notes (Signed)
Brief colonoscopy note:  Multiple diverticula at cecum and ascending colon transverse and sigmoid colon.  None with stigmata of bleed. Small external hemorrhoids.  Suspect colonic diverticular bleed which has stopped spontaneously.

## 2017-12-29 ENCOUNTER — Encounter (HOSPITAL_COMMUNITY): Payer: Self-pay | Admitting: Internal Medicine

## 2017-12-29 DIAGNOSIS — K5730 Diverticulosis of large intestine without perforation or abscess without bleeding: Secondary | ICD-10-CM | POA: Diagnosis not present

## 2017-12-29 DIAGNOSIS — I472 Ventricular tachycardia: Secondary | ICD-10-CM | POA: Diagnosis not present

## 2017-12-29 DIAGNOSIS — M1990 Unspecified osteoarthritis, unspecified site: Secondary | ICD-10-CM | POA: Diagnosis present

## 2017-12-29 DIAGNOSIS — K219 Gastro-esophageal reflux disease without esophagitis: Secondary | ICD-10-CM | POA: Diagnosis present

## 2017-12-29 DIAGNOSIS — I10 Essential (primary) hypertension: Secondary | ICD-10-CM | POA: Diagnosis present

## 2017-12-29 DIAGNOSIS — R1310 Dysphagia, unspecified: Secondary | ICD-10-CM | POA: Diagnosis not present

## 2017-12-29 DIAGNOSIS — Z981 Arthrodesis status: Secondary | ICD-10-CM | POA: Diagnosis not present

## 2017-12-29 DIAGNOSIS — Z7982 Long term (current) use of aspirin: Secondary | ICD-10-CM | POA: Diagnosis not present

## 2017-12-29 DIAGNOSIS — I959 Hypotension, unspecified: Secondary | ICD-10-CM | POA: Diagnosis present

## 2017-12-29 DIAGNOSIS — Z833 Family history of diabetes mellitus: Secondary | ICD-10-CM | POA: Diagnosis not present

## 2017-12-29 DIAGNOSIS — K625 Hemorrhage of anus and rectum: Secondary | ICD-10-CM | POA: Diagnosis not present

## 2017-12-29 DIAGNOSIS — K644 Residual hemorrhoidal skin tags: Secondary | ICD-10-CM | POA: Diagnosis present

## 2017-12-29 DIAGNOSIS — K3189 Other diseases of stomach and duodenum: Secondary | ICD-10-CM | POA: Diagnosis not present

## 2017-12-29 DIAGNOSIS — K5731 Diverticulosis of large intestine without perforation or abscess with bleeding: Secondary | ICD-10-CM | POA: Diagnosis present

## 2017-12-29 DIAGNOSIS — Z961 Presence of intraocular lens: Secondary | ICD-10-CM | POA: Diagnosis present

## 2017-12-29 DIAGNOSIS — K228 Other specified diseases of esophagus: Secondary | ICD-10-CM | POA: Diagnosis not present

## 2017-12-29 DIAGNOSIS — Z9841 Cataract extraction status, right eye: Secondary | ICD-10-CM | POA: Diagnosis not present

## 2017-12-29 DIAGNOSIS — D62 Acute posthemorrhagic anemia: Secondary | ICD-10-CM

## 2017-12-29 DIAGNOSIS — Z9842 Cataract extraction status, left eye: Secondary | ICD-10-CM | POA: Diagnosis not present

## 2017-12-29 DIAGNOSIS — E876 Hypokalemia: Secondary | ICD-10-CM | POA: Diagnosis not present

## 2017-12-29 DIAGNOSIS — R55 Syncope and collapse: Secondary | ICD-10-CM | POA: Diagnosis present

## 2017-12-29 DIAGNOSIS — Z8249 Family history of ischemic heart disease and other diseases of the circulatory system: Secondary | ICD-10-CM | POA: Diagnosis not present

## 2017-12-29 DIAGNOSIS — F17200 Nicotine dependence, unspecified, uncomplicated: Secondary | ICD-10-CM | POA: Diagnosis present

## 2017-12-29 DIAGNOSIS — Z96641 Presence of right artificial hip joint: Secondary | ICD-10-CM | POA: Diagnosis present

## 2017-12-29 DIAGNOSIS — J449 Chronic obstructive pulmonary disease, unspecified: Secondary | ICD-10-CM | POA: Diagnosis present

## 2017-12-29 DIAGNOSIS — R112 Nausea with vomiting, unspecified: Secondary | ICD-10-CM | POA: Diagnosis not present

## 2017-12-29 DIAGNOSIS — K922 Gastrointestinal hemorrhage, unspecified: Secondary | ICD-10-CM | POA: Diagnosis not present

## 2017-12-29 LAB — CBC
HEMATOCRIT: 26.5 % — AB (ref 39.0–52.0)
Hemoglobin: 8.9 g/dL — ABNORMAL LOW (ref 13.0–17.0)
MCH: 30.8 pg (ref 26.0–34.0)
MCHC: 33.6 g/dL (ref 30.0–36.0)
MCV: 91.7 fL (ref 78.0–100.0)
Platelets: 158 10*3/uL (ref 150–400)
RBC: 2.89 MIL/uL — ABNORMAL LOW (ref 4.22–5.81)
RDW: 16.1 % — AB (ref 11.5–15.5)
WBC: 13.8 10*3/uL — ABNORMAL HIGH (ref 4.0–10.5)

## 2017-12-29 LAB — HEMOGLOBIN AND HEMATOCRIT, BLOOD
HCT: 26.6 % — ABNORMAL LOW (ref 39.0–52.0)
HCT: 24.2 % — ABNORMAL LOW (ref 39.0–52.0)
HEMOGLOBIN: 8.1 g/dL — AB (ref 13.0–17.0)
Hemoglobin: 9 g/dL — ABNORMAL LOW (ref 13.0–17.0)

## 2017-12-29 LAB — BASIC METABOLIC PANEL
ANION GAP: 3 — AB (ref 5–15)
BUN: 18 mg/dL (ref 8–23)
CHLORIDE: 111 mmol/L (ref 98–111)
CO2: 23 mmol/L (ref 22–32)
Calcium: 7.6 mg/dL — ABNORMAL LOW (ref 8.9–10.3)
Creatinine, Ser: 0.95 mg/dL (ref 0.61–1.24)
GFR calc Af Amer: >60 mL/min (ref >60)
GFR calc non Af Amer: >60 mL/min (ref >60)
GLUCOSE: 116 mg/dL — AB (ref 70–99)
POTASSIUM: 4.5 mmol/L (ref 3.5–5.1)
SODIUM: 137 mmol/L (ref 135–145)

## 2017-12-29 LAB — GLUCOSE, CAPILLARY: GLUCOSE-CAPILLARY: 100 mg/dL — AB (ref 70–99)

## 2017-12-29 LAB — MRSA PCR SCREENING: MRSA by PCR: NEGATIVE

## 2017-12-29 LAB — PREPARE RBC (CROSSMATCH)

## 2017-12-29 MED ORDER — ACETAMINOPHEN 325 MG PO TABS
650.0000 mg | ORAL_TABLET | Freq: Once | ORAL | Status: AC
  Administered 2017-12-29: 650 mg via ORAL
  Filled 2017-12-29: qty 2

## 2017-12-29 MED ORDER — SODIUM CHLORIDE 0.9% IV SOLUTION
Freq: Once | INTRAVENOUS | Status: AC
  Administered 2017-12-29: 22:00:00 via INTRAVENOUS

## 2017-12-29 MED ORDER — POTASSIUM CHLORIDE CRYS ER 20 MEQ PO TBCR
40.0000 meq | EXTENDED_RELEASE_TABLET | Freq: Once | ORAL | Status: AC
  Administered 2017-12-29: 40 meq via ORAL
  Filled 2017-12-29: qty 2

## 2017-12-29 MED ORDER — DIPHENHYDRAMINE HCL 25 MG PO CAPS
25.0000 mg | ORAL_CAPSULE | Freq: Once | ORAL | Status: AC
  Administered 2017-12-29: 25 mg via ORAL
  Filled 2017-12-29: qty 1

## 2017-12-29 NOTE — Care Management Note (Signed)
Case Management Note  Patient Details  Name: Randy Russo MRN: [id number] Date of Birth: 1941/01/28  Subjective/Objective:  GIB. From home with wife. Family at bedside.  Reports independence. Using no assistive devices pta.  Production [employer], [employer] [facility]. Has PCP. Wife drives him to appointment.  CM consulted for medication needs. Patient reports he wanted to change medications due to side effects. Patient will need to address with PCP, CM can not change medications.                 Action/Plan: Recommended for HH PT and RW. Agreeable to RW, no preference on DME agency.  Declines HH.   Expected Discharge Date:     12/30/2017             Expected Discharge Plan:  Home/Self Care  In-House Referral:     Discharge planning Services  CM Consult  Post Acute Care Choice:  Durable Medical Equipment, Home Health Choice offered to:  Patient  DME Arranged:  Walker rolling DME Agency:  [place]:  Patient Refused [place] Agency:     Status of Service:  Completed, signed off  If discussed at [place] of Stay Meetings, dates discussed:    Additional Comments:  Maressa Apollo, Chauncey Reading, RN 12/29/2017, 12:26 PM

## 2017-12-29 NOTE — Evaluation (Signed)
Physical Therapy Evaluation Patient Details Name: Randy Russo MRN: [id number] DOB: 07-01-1940 Today's Date: 12/29/2017   History of Present Illness  Randy Russo is a 77 y.o. male with a medical history of hypertension, COPD, who presented to the emergency department with complaints of almost passing out as well as a GI bleed.  It seems that patient had gotten up this morning to use the bathroom and had a bowel movement.  He was unable to get up from the commode, had fallen backwards at which point his eyes rolled back into his head and he had some shaking.  Was reported by patient's wife.  Patient does not recall these events.  Patient's wife does not note any bloody bowel movement.  Per EMS patient had a large bloody bowel movement.  Patient does feel weak and dizzy at this time.  Denies current chest pain, shortness of breath, abdominal pain, nausea or vomiting, constipation, problems with urination, numbness or tingling, recent illness or travel.    Clinical Impression  Patient functioning near baseline for functional mobility and gait, other than having to lean on nearby objects for support during transfers and when walking, requiring the use of RW for safety, demonstrates good return for using RW and tolerated sitting up in chair after therapy with a family member in room.  Patient will benefit from continued physical therapy in hospital and recommended venue below to increase strength, balance, endurance for safe ADLs and gait.     Follow Up Recommendations Home health PT;Supervision - Intermittent    Equipment Recommendations  Rolling walker with 5" wheels    Recommendations for Other Services       Precautions / Restrictions Precautions Precautions: Fall Restrictions Weight Bearing Restrictions: No      Mobility  Bed Mobility Overal bed mobility: Modified Independent                Transfers Overall transfer level: Needs assistance   Transfers: Sit to/from  Stand;Stand Pivot Transfers Sit to Stand: Supervision Stand pivot transfers: Supervision       General transfer comment: tends to lean on nearby objects for support  Ambulation/Gait Ambulation/Gait assistance: Supervision Gait Distance (Feet): 100 Feet Assistive device: Rolling walker (2 wheeled) Gait Pattern/deviations: Decreased step length - right;Decreased step length - left;Decreased stride length Gait velocity: near normal   General Gait Details: tends to lean on nearby objects for support when not using AD, requested use of RW and demonstrates slightly labored cadence without loss of balance  Stairs            Wheelchair Mobility    Modified Rankin (Stroke Patients Only)       Balance Overall balance assessment: Needs assistance Sitting-balance support: Feet supported;No upper extremity supported Sitting balance-Leahy Scale: Good     Standing balance support: During functional activity;No upper extremity supported Standing balance-Leahy Scale: Fair Standing balance comment: fair/good with RW                             Pertinent Vitals/Pain Pain Assessment: 0-10 Pain Score: 3  Pain Location: arthritic pain fingers bilateral hands Pain Descriptors / Indicators: Aching;Discomfort Pain Intervention(s): Limited activity within patient's tolerance;Monitored during session;Heat applied(patient using heat pads)    Home Living Family/patient expects to be discharged to:: Private residence Living Arrangements: Spouse/significant other   Type of Home: House Home Access: Level entry     Home Layout: One level Home Equipment: Kasandra Knudsen -  single point      Prior Function Level of Independence: Independent with assistive device(s)         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     Hand Dominance        Extremity/Trunk Assessment   Upper Extremity Assessment Upper Extremity Assessment: Generalized weakness    Lower Extremity  Assessment Lower Extremity Assessment: Generalized weakness    Cervical / Trunk Assessment Cervical / Trunk Assessment: Normal  Communication   Communication: No difficulties  Cognition Arousal/Alertness: Awake/alert Behavior During Therapy: WFL for tasks assessed/performed Overall Cognitive Status: Within Functional Limits for tasks assessed                                        General Comments      Exercises     Assessment/Plan    PT Assessment Patient needs continued PT services  PT Problem List Decreased strength;Decreased activity tolerance;Decreased balance;Decreased mobility       PT Treatment Interventions Gait training;Stair training;Functional mobility training;Therapeutic activities;Therapeutic exercise;Patient/family education    PT Goals (Current goals can be found in the Care Plan section)  Acute Rehab PT Goals Patient Stated Goal: return home PT Goal Formulation: With patient/family Time For Goal Achievement: 12/29/17 Potential to Achieve Goals: Good    Frequency Min 3X/week   Barriers to discharge        Co-evaluation               AM-PAC PT "6 Clicks" Daily Activity  Outcome Measure Difficulty turning over in bed (including adjusting bedclothes, sheets and blankets)?: None Difficulty moving from lying on back to sitting on the side of the bed? : None Difficulty sitting down on and standing up from a chair with arms (e.g., wheelchair, bedside commode, etc,.)?: None Help needed moving to and from a bed to chair (including a wheelchair)?: None Help needed walking in hospital room?: A Little Help needed climbing 3-5 steps with a railing? : A Little 6 Click Score: 22    End of Session   Activity Tolerance: Patient tolerated treatment well;Patient limited by fatigue Patient left: in chair;with call bell/phone within reach;with family/visitor present Nurse Communication: Mobility status PT Visit Diagnosis: Unsteadiness on  feet (R26.81);Other abnormalities of gait and mobility (R26.89);Muscle weakness (generalized) (M62.81)    Time: 3212-2482 PT Time Calculation (min) (ACUTE ONLY): 28 min   Charges:   PT Evaluation $PT Eval Moderate Complexity: 1 Mod PT Treatments $Therapeutic Activity: 23-37 mins        1:53 PM, 12/29/17 Lonell Grandchild, MPT Physical Therapist with [facility] [phone] office [phone] mobile phone

## 2017-12-29 NOTE — Progress Notes (Signed)
PROGRESS NOTE    Randy Russo  QAS:[id number] DOB: [birth year] DOA: 12/27/2017 PCP: Sharilyn Sites, MD   Brief Narrative:  HPI On 12/27/2017  Randy Russo is a 77 y.o. male with a medical history of hypertension, COPD, who presented to the emergency department with complaints of almost passing out as well as a GI bleed.  It seems that patient had gotten up this morning to use the bathroom and had a bowel movement.  He was unable to get up from the commode, had fallen backwards at which point his eyes rolled back into his head and he had some shaking.  Was reported by patient's wife.  Patient does not recall these events.  Patient's wife does not note any bloody bowel movement.  Per EMS patient had a large bloody bowel movement.  Patient does feel weak and dizzy at this time.  Denies current chest pain, shortness of breath, abdominal pain, nausea or vomiting, constipation, problems with urination, numbness or tingling, recent illness or travel.  Interim history Admitted for near syncope secondary to GI Bleed. GI consulted, s/p colonoscopy.  Overnight, it seems that patient had a syncopal event last night and continued to have several episodes of hematochezia. Patient was given IVF as well as 2u PRBCs and transferred to stepdown. Assessment & Plan   GI bleed/ Hematochezia/ Diverticular bleed/ Acute on chronic normocytic anemia -presented with hypotension, near syncopal event -Hemoglobin on admission 11.1.  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 None with stigmata of bleed. Small external hemorrhoids. -Ovennight, patient continued to have several episodes of painless hematochezia as well as a syncopal event -Hemoglobin was checked and found to be 8.5, however patient was given 2u PRBCs -hemoglobin currently 8.9 -Will continue to monitor  CBC  Syncope -likely secondary to GI bleed/ hypotension-vasovagal -currently no neurological deficits -Continue to monitor   Hypotension -BP overnight downto 97/58, however improved with IV resuscitation -patient now normotensive   Essential hypertension -presented with hypotension, will hold lisinopril and monitor   COPD -Currently stable. No wheezing on exam -continue Spiriva and albuterol PRN  Weakness -suspect secondary to anemia/GI bleed -PT consulted and pending   DVT Prophylaxis  SCDs  Code Status: Full  Family Communication: None at bedside.  Disposition Plan: Inpatient, Suspect patient can be discharged to home once bleeding has subsided, suspect within 24-48 hours- pending further GI recommendations.   Consultants Gastroenterology  Procedures  Colonoscopy  Antibiotics   Anti-infectives (From admission, onward)   None      Subjective:   Randy Russo seen and examined today.  Patient has no complaints this morning. Denies chest pain, shortness of breath, abdominal pain nausea, vomiting, dizziness, headache. Overnight had several episodes of bleeding. Currently feels weak.    Objective:   Vitals:   12/29/17 0700 12/29/17 0739 12/29/17 0743 12/29/17 0800  BP: (!) 117/52   126/81  Pulse: 88 (!) 56  (!) 107  Resp: 18 18  (!) 30  Temp:  98.7 F (37.1 C)    TempSrc:  Oral    SpO2: 100% 100% 97% 97%  Weight:      Height:        Intake/Output Summary (Last 24 hours) at 12/29/2017 0913 Last data filed at 12/29/2017 0115 Gross per 24 hour  Intake 1350.5 ml  Output 100 ml  Net 1250.5 ml   Filed Weights   12/27/17 1016  Weight: 125 kg   Exam  General: Well developed, well nourished, NAD, appears stated age  22: NCAT, mucous membranes moist. Edentulous   Neck: Supple  Cardiovascular: S1 S2 auscultated, RRR, no murmur  Respiratory: Clear to auscultation bilaterally with equal chest rise  Abdomen: Soft, nontender, nondistended, + bowel  sounds  Extremities: warm dry without cyanosis clubbing or edema  Neuro: AAOx3, nonfocal   Psych: Normal affect and demeanor  Data Reviewed: I have personally reviewed following labs and imaging studies  CBC: Recent Labs  Lab 12/27/17 1034 12/27/17 1602 12/28/17 0346 12/28/17 1629 12/28/17 2013 12/29/17 0528  WBC 8.1  --  7.8  --  12.0* 13.8*  HGB 11.1* 11.3* 9.9* 9.8* 8.5* 8.9*  HCT 34.6* 35.1* 30.8* 30.8* 26.6* 26.5*  MCV 97.2  --  96.6  --  96.7 91.7  PLT 191  --  195  --  194 315   Basic Metabolic Panel: Recent Labs  Lab 12/27/17 1034 12/28/17 0346 12/28/17 2013 12/29/17 0724  NA 141 139 138 137  K 3.9 4.0 3.4* 4.5  CL 107 109 110 111  CO2 25 26 23 23   GLUCOSE 174* 110* 147* 116*  BUN 15 15 17 18   CREATININE 1.09 0.96 1.00 0.95  CALCIUM 8.5* 8.4* 8.1* 7.6*   GFR: Estimated Creatinine Clearance: 89 mL/min (by C-G formula based on SCr of 0.95 mg/dL). Liver Function Tests: Recent Labs  Lab 12/27/17 1034 12/28/17 2013  AST 17 19  ALT 18 18  ALKPHOS 40 38  BILITOT 0.7 0.2*  PROT 6.1* 5.4*  ALBUMIN 3.1* 2.9*   No results for input(s): LIPASE, AMYLASE in the last 168 hours. No results for input(s): AMMONIA in the last 168 hours. Coagulation Profile: Recent Labs  Lab 12/27/17 1034  INR 1.14   Cardiac Enzymes: Recent Labs  Lab 12/27/17 1034  TROPONINI <0.03   BNP (last 3 results) No results for input(s): PROBNP in the last 8760 hours. HbA1C: No results for input(s): HGBA1C in the last 72 hours. CBG: Recent Labs  Lab 12/28/17 2006  GLUCAP 130*   Lipid Profile: No results for input(s): CHOL, HDL, LDLCALC, TRIG, CHOLHDL, LDLDIRECT in the last 72 hours. Thyroid Function Tests: No results for input(s): TSH, T4TOTAL, FREET4, T3FREE, THYROIDAB in the last 72 hours. Anemia Panel: No results for input(s): VITAMINB12, FOLATE, FERRITIN, TIBC, IRON, RETICCTPCT in the last 72 hours. Urine analysis:    Component Value Date/Time   COLORURINE YELLOW  05/09/2017 1705   APPEARANCEUR HAZY (A) 05/09/2017 1705   LABSPEC 1.017 05/09/2017 1705   PHURINE 5.0 05/09/2017 1705   GLUCOSEU NEGATIVE 05/09/2017 1705   HGBUR MODERATE (A) 05/09/2017 1705   BILIRUBINUR NEGATIVE 05/09/2017 1705   KETONESUR NEGATIVE 05/09/2017 1705   PROTEINUR NEGATIVE 05/09/2017 1705   UROBILINOGEN 0.2 09/13/2010 0850   NITRITE NEGATIVE 05/09/2017 1705   LEUKOCYTESUR SMALL (A) 05/09/2017 1705   Sepsis Labs: @LABRCNTIP (procalcitonin:4,lacticidven:4)  ) Recent Results (from the past 240 hour(s))  MRSA PCR Screening     Status: None   Collection Time: 12/28/17 11:36 PM  Result Value Ref Range Status   MRSA by PCR NEGATIVE NEGATIVE Final    Comment:        The GeneXpert MRSA Assay (FDA approved for NASAL specimens only), is one component of a comprehensive MRSA colonization surveillance program. It is not intended to diagnose MRSA infection nor to guide or monitor treatment for MRSA infections. Performed at [facility], [address] [place], [place] [address]  Radiology Studies: No results found.   Scheduled Meds: . sodium chloride flush  3 mL Intravenous Q12H  . tiotropium  18 mcg Inhalation Daily   Continuous Infusions: . sodium chloride       LOS: 0 days   Time Spent in minutes   30 minutes   Tylah Mancillas D.O. on 12/29/2017 at 9:13 AM  Between 7am to 7pm - Please see pager noted on [email or web address]  After 7pm go to [email or web address]  And look for the night coverage person covering for me after hours  [facility] Office  [phone]

## 2017-12-29 NOTE — Plan of Care (Signed)

## 2017-12-29 NOTE — Progress Notes (Signed)
  Subjective:  Patient has no complaints.  He did have a bowel movement and noted blood only on the tissue.  Is not passed any blood per rectum since an episode last evening when he passed out.  He did not experience nausea vomiting or epigastric pain.  Objective: Blood pressure 109/72, pulse (!) 124, temperature 98.1 F (36.7 C), temperature source Oral, resp. rate 19, height 6' (1.829 m), weight 125 kg, SpO2 99 %. Patient is alert.  He is sitting in a chair. Abdomen is full.  Bowel sounds are hyperactive.  On palpation is soft and nontender.  No organomegaly or masses.  Labs/studies Results:  Recent Labs    2018-01-24 0346  January 24, 2018 2013 12/29/17 0528 12/29/17 1256  WBC 7.8  --  12.0* 13.8*  --   HGB 9.9*   < > 8.5* 8.9* 9.0*  HCT 30.8*   < > 26.6* 26.5* 26.6*  PLT 195  --  194 158  --    < > = values in this interval not displayed.    BMET  Recent Labs    01-24-18 0346 2018/01/24 2013 12/29/17 0724  NA 139 138 137  K 4.0 3.4* 4.5  CL 109 110 111  CO2 26 23 23   GLUCOSE 110* 147* 116*  BUN 15 17 18   CREATININE 0.96 1.00 0.95  CALCIUM 8.4* 8.1* 7.6*    LFT  Recent Labs    12/27/17 1034 01-24-18 2013  PROT 6.1* 5.4*  ALBUMIN 3.1* 2.9*  AST 17 19  ALT 18 18  ALKPHOS 40 38  BILITOT 0.7 0.2*    PT/INR  Recent Labs    12/27/17 1034  LABPROT 14.5  INR 1.14     Assessment:  #1.  Recurrent rectal bleeding.  Patient presented 2 days ago with large volume painless hematochezia.  He underwent colonoscopy yesterday morning revealing multiple diverticula at cecum ascending colon hepatic flexure and sigmoid colon without stigmata of bleeding.  Patient suffered another episode of large-volume hematochezia last night associated with syncope.  He has received 2 units of PRBCs.  He has not had any more episodes of bleeding.  Patient's BUN is normal. Therefore I do not believe source is either from small or UGI tract.  Last aspirin dose was 3 days ago. If he has any further  episode of bleeding will proceed with GI bleeding scan before considering repeat endoscopy or angiography.  #2.  Anemia secondary GI bleed.  Patient has received 2 units of PRBCs. . Recommendations:  Clear liquids for now. Check H&H at 7 PM today.

## 2017-12-29 NOTE — Plan of Care (Signed)
  Problem: Acute Rehab PT Goals(only PT should resolve) Goal: Patient Will Transfer Sit To/From Stand Outcome: Progressing Flowsheets (Taken 12/29/2017 1354) Patient will transfer sit to/from stand: Independently Goal: Pt Will Transfer Bed To Chair/Chair To Bed Outcome: Progressing Flowsheets (Taken 12/29/2017 1354) Pt will Transfer Bed to Chair/Chair to Bed: Independently Goal: Pt Will Ambulate Outcome: Progressing Flowsheets (Taken 12/29/2017 1354) Pt will Ambulate: > 125 feet; with modified independence; with rolling walker; with cane   1:55 PM, 12/29/17 Lonell Grandchild, MPT Physical Therapist with [facility] [phone] office [phone] mobile phone]

## 2017-12-29 NOTE — Progress Notes (Signed)
Dr. Laural Golden called to check on pt- RN notified of hemoglobin of 8.1 and that pts HR increases with movement even getting up to 140s.  New order to transfuse 1 unit of PRBCs x1 as well as Tylenol and Benadryl x1 before blood is given.

## 2017-12-30 LAB — BASIC METABOLIC PANEL
ANION GAP: 3 — AB (ref 5–15)
BUN: 12 mg/dL (ref 8–23)
CALCIUM: 8.2 mg/dL — AB (ref 8.9–10.3)
CO2: 25 mmol/L (ref 22–32)
Chloride: 112 mmol/L — ABNORMAL HIGH (ref 98–111)
Creatinine, Ser: 0.92 mg/dL (ref 0.61–1.24)
Glucose, Bld: 123 mg/dL — ABNORMAL HIGH (ref 70–99)
Potassium: 4.8 mmol/L (ref 3.5–5.1)
SODIUM: 140 mmol/L (ref 135–145)

## 2017-12-30 LAB — CBC
HEMATOCRIT: 26 % — AB (ref 39.0–52.0)
Hemoglobin: 8.7 g/dL — ABNORMAL LOW (ref 13.0–17.0)
MCH: 30.4 pg (ref 26.0–34.0)
MCHC: 33.5 g/dL (ref 30.0–36.0)
MCV: 90.9 fL (ref 80.0–100.0)
PLATELETS: 145 10*3/uL — AB (ref 150–400)
RBC: 2.86 MIL/uL — ABNORMAL LOW (ref 4.22–5.81)
RDW: 16.9 % — AB (ref 11.5–15.5)
WBC: 12.1 10*3/uL — AB (ref 4.0–10.5)

## 2017-12-30 LAB — HEMOGLOBIN AND HEMATOCRIT, BLOOD
HCT: 26.3 % — ABNORMAL LOW (ref 39.0–52.0)
HEMATOCRIT: 27.4 % — AB (ref 39.0–52.0)
Hemoglobin: 8.8 g/dL — ABNORMAL LOW (ref 13.0–17.0)
Hemoglobin: 8.4 g/dL — ABNORMAL LOW (ref 13.0–17.0)

## 2017-12-30 LAB — PREPARE RBC (CROSSMATCH)

## 2017-12-30 NOTE — Progress Notes (Signed)
  Subjective:  Patient has no complaints.  He states he is starving.  He denies abdominal pain.  He had bowel movement during the night and he passed formed stool.  He has not passed any blood in over 24 hours.   Objective: Blood pressure (!) 124/58, pulse 96, temperature 98.6 F (37 C), temperature source Oral, resp. rate (!) 21, height 6' (1.829 m), weight 114.2 kg, SpO2 98 %. Patient is alert and in no acute distress. Cardiac exam with regular rhythm normal S1 and S2.  No murmur gallop noted. Lungs are clear to auscultation. Abdomen is full.  Bowel sounds are normal.  On palpation is soft and nontender with organomegaly or masses. No LE edema noted.  Labs/studies Results:  Recent Labs    09-Jan-2018 2013 12/29/17 0528 12/29/17 1256 12/29/17 1930 12/30/17 0437  WBC 12.0* 13.8*  --   --  12.1*  HGB 8.5* 8.9* 9.0* 8.1* 8.7*  HCT 26.6* 26.5* 26.6* 24.2* 26.0*  PLT 194 158  --   --  145*    BMET  Recent Labs    2018/01/09 2013 12/29/17 0724 12/30/17 0437  NA 138 137 140  K 3.4* 4.5 4.8  CL 110 111 112*  CO2 23 23 25   GLUCOSE 147* 116* 123*  BUN 17 18 12   CREATININE 1.00 0.95 0.92  CALCIUM 8.1* 7.6* 8.2*    LFT  Recent Labs    12/27/17 1034 2018-01-09 2013  PROT 6.1* 5.4*  ALBUMIN 3.1* 2.9*  AST 17 19  ALT 18 18  ALKPHOS 40 38  BILITOT 0.7 0.2*    PT/INR  Recent Labs    12/27/17 1034  LABPROT 14.5  INR 1.14     Assessment:  #1.  Lower GI bleed secondary to colonic diverticulosis.  She had a colonoscopy 2 days ago revealing multiple diverticula but no active bleeding.  He bled again about 12 hours after colonoscopy.  It appears he has stopped bleeding.  He has received 3 units of PRBCs altogether.  If bleeding recurs will proceed with GI bleeding scan.   #2.  Anemia secondary to GI bleed.  Recommendations:  Advance diet to heart healthy diet. Discussed with Dr. Ree Kida who is considering to give another unit of PRBCs.

## 2017-12-30 NOTE — Progress Notes (Signed)
Physical Therapy Treatment Patient Details Name: Randy Russo MRN: [id number] DOB: [birth year] Today's Date: 12/30/2017    History of Present Illness Randy Russo is a 77 y.o. male with a medical history of hypertension, COPD, who presented to the emergency department with complaints of almost passing out as well as a GI bleed.  It seems that patient had gotten up this morning to use the bathroom and had a bowel movement.  He was unable to get up from the commode, had fallen backwards at which point his eyes rolled back into his head and he had some shaking.  Was reported by patient's wife.  Patient does not recall these events.  Patient's wife does not note any bloody bowel movement.  Per EMS patient had a large bloody bowel movement.  Patient does feel weak and dizzy at this time.  Denies current chest pain, shortness of breath, abdominal pain, nausea or vomiting, constipation, problems with urination, numbness or tingling, recent illness or travel.    PT Comments    Patient demonstrates increased endurance/distance for gait training without loss of balance, good return for completing BLE exercises while seated in chair with verbal cueing and tolerated staying up in chair after therapy.  Patient will benefit from continued physical therapy in hospital and recommended venue below to increase strength, balance, endurance for safe ADLs and gait.    Follow Up Recommendations  Home health PT;Supervision - Intermittent     Equipment Recommendations  Rolling walker with 5" wheels    Recommendations for Other Services       Precautions / Restrictions Precautions Precautions: Fall Restrictions Weight Bearing Restrictions: No    Mobility  Bed Mobility               General bed mobility comments: presents seated in chair  Transfers Overall transfer level: Modified independent   Transfers: Sit to/from Stand;Stand Pivot Transfers Sit to Stand: Modified independent  (Device/Increase time) Stand pivot transfers: Modified independent (Device/Increase time)          Ambulation/Gait Ambulation/Gait assistance: Supervision Gait Distance (Feet): 200 Feet Assistive device: Rolling walker (2 wheeled) Gait Pattern/deviations: Decreased step length - right;Decreased step length - left;Decreased stride length Gait velocity: near normal   General Gait Details: demonstrates increased endurance/distance for ambulation with slightly labored cadence without loss of balance   Stairs             Wheelchair Mobility    Modified Rankin (Stroke Patients Only)       Balance Overall balance assessment: Needs assistance Sitting-balance support: Feet supported;No upper extremity supported Sitting balance-Leahy Scale: Good     Standing balance support: During functional activity Standing balance-Leahy Scale: Fair Standing balance comment: fair/good with RW                            Cognition Arousal/Alertness: Awake/alert Behavior During Therapy: WFL for tasks assessed/performed Overall Cognitive Status: Within Functional Limits for tasks assessed                                        Exercises General Exercises - Lower Extremity Long Arc Quad: Seated;AROM;Strengthening;Both;10 reps Hip Flexion/Marching: Seated;AROM;Strengthening;Both;10 reps Toe Raises: Seated;AROM;Strengthening;Both;10 reps Heel Raises: Seated;AROM;Strengthening;Both;10 reps    General Comments        Pertinent Vitals/Pain Pain Assessment: No/denies pain    Home Living  Prior Function            PT Goals (current goals can now be found in the care plan section) Acute Rehab PT Goals Patient Stated Goal: return home PT Goal Formulation: With patient/family Time For Goal Achievement: 12/29/17 Potential to Achieve Goals: Good Progress towards PT goals: Progressing toward goals    Frequency    Min  3X/week      PT Plan Current plan remains appropriate    Co-evaluation              AM-PAC PT "6 Clicks" Daily Activity  Outcome Measure  Difficulty turning over in bed (including adjusting bedclothes, sheets and blankets)?: None Difficulty moving from lying on back to sitting on the side of the bed? : None Difficulty sitting down on and standing up from a chair with arms (e.g., wheelchair, bedside commode, etc,.)?: None Help needed moving to and from a bed to chair (including a wheelchair)?: None Help needed walking in hospital room?: A Little Help needed climbing 3-5 steps with a railing? : A Little 6 Click Score: 22    End of Session   Activity Tolerance: Patient tolerated treatment well Patient left: in chair;with call bell/phone within reach Nurse Communication: Mobility status PT Visit Diagnosis: Unsteadiness on feet (R26.81);Other abnormalities of gait and mobility (R26.89);Muscle weakness (generalized) (M62.81)     Time: 0929-5747 PT Time Calculation (min) (ACUTE ONLY): 31 min  Charges:  $Therapeutic Exercise: 8-22 mins $Therapeutic Activity: 8-22 mins                     1:34 PM, 12/30/17 Lonell Grandchild, MPT Physical Therapist with [facility] 336 [phone] office [phone] mobile phone

## 2017-12-30 NOTE — Progress Notes (Signed)
PROGRESS NOTE    Randy Russo  ZDG:[id number] DOB: 1940-03-27 DOA: 12/27/2017 PCP: Sharilyn Sites, MD   Brief Narrative:  HPI On 12/27/2017  Randy Russo is a 77 y.o. male with a medical history of hypertension, COPD, who presented to the emergency department with complaints of almost passing out as well as a GI bleed.  It seems that patient had gotten up this morning to use the bathroom and had a bowel movement.  He was unable to get up from the commode, had fallen backwards at which point his eyes rolled back into his head and he had some shaking.  Was reported by patient's wife.  Patient does not recall these events.  Patient's wife does not note any bloody bowel movement.  Per EMS patient had a large bloody bowel movement.  Patient does feel weak and dizzy at this time.  Denies current chest pain, shortness of breath, abdominal pain, nausea or vomiting, constipation, problems with urination, numbness or tingling, recent illness or travel.  Interim history Admitted for near syncope secondary to GI Bleed. GI consulted, s/p colonoscopy.  Overnight, it seems that patient had a syncopal event last night and continued to have several episodes of hematochezia. Patient was given IVF as well as 2u PRBCs and transferred to stepdown. Given 1 additional unit overnight by Dr. Laural Golden.  Assessment & Plan   GI bleed/ Hematochezia/ Diverticular bleed/ Acute on chronic normocytic anemia/ Symptomatic anemia/ Anemia of acute blood loss -presented with hypotension, near syncopal event -Hemoglobin on admission 11.1.  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 None with stigmata of bleed. Small external hemorrhoids. -Overnight on 10/6-7, patient continued to have several episodes of painless hematochezia as well as a syncopal event -Patient has been transfused 3  units PRBC thus far -Hemoglobin currently 8.8 -Continue to monitor CBC -Per gastroenterology, continue to monitor, if bleeding restarts would obtain bleeding scan.  Syncope -likely secondary to GI bleed/ hypotension-vasovagal -currently no neurological deficits -Continue to monitor   Hypotension -Resolved with IV resuscitation and blood transfusions  Essential hypertension -presented with hypotension, will hold lisinopril and monitor   COPD -Currently stable. No wheezing on exam -continue Spiriva and albuterol PRN  Weakness -suspect secondary to anemia/GI bleed -PT consulted and recommended HH, however patient has declined  DVT Prophylaxis  SCDs  Code Status: Full  Family Communication: None at bedside.  Disposition Plan: Inpatient, Suspect patient can be discharged to home once bleeding has subsided, suspect within 24-48 hours- pending further GI recommendations.   Consultants Gastroenterology  Procedures  Colonoscopy  Antibiotics   Anti-infectives (From admission, onward)   None      Subjective:   Randy Russo seen and examined today.  No further complaints of blood per rectum. Denies current dizziness or weakness. Denies current chest pain, shortness of breath, abdominal pain, N/V.  Objective:   Vitals:   12/30/17 0756 12/30/17 0823 12/30/17 1013 12/30/17 1336  BP:   106/73 (!) 115/58  Pulse: 96  91 89  Resp: (!) 21  (!) 22 20  Temp: 98.6 F (37 C)  99.1 F (37.3 C)   TempSrc: Oral  Oral   SpO2: 98% 97% 100% 100%  Weight:      Height:        Intake/Output Summary (Last 24 hours) at 12/30/2017 1420 Last data filed at 12/30/2017 1300 Gross per 24 hour  Intake 986 ml  Output 2950 ml  Net -1964 ml   Filed Weights   12/27/17 1016 12/30/17 0500  Weight: 125 kg 114.2 kg   Exam  General: Well developed, well nourished, NAD, appears stated age  14: NCAT, mucous membranes moist. Edentulous  Neck: Supple  Cardiovascular: S1 S2  auscultated, no rubs, murmurs or gallops. Regular rate and rhythm.  Respiratory: Clear to auscultation bilaterally with equal chest rise  Abdomen: Soft, nontender, nondistended, + bowel sounds  Extremities: warm dry without cyanosis clubbing or edema  Neuro: AAOx3, nonfocal   Psych: Normal affect and demeanor with intact judgement and insight  Data Reviewed: I have personally reviewed following labs and imaging studies  CBC: Recent Labs  Lab 12/27/17 1034  12/28/17 0346  12/28/17 2013 12/29/17 0528 12/29/17 1256 12/29/17 1930 12/30/17 0437 12/30/17 1341  WBC 8.1  --  7.8  --  12.0* 13.8*  --   --  12.1*  --   HGB 11.1*   < > 9.9*   < > 8.5* 8.9* 9.0* 8.1* 8.7* 8.8*  HCT 34.6*   < > 30.8*   < > 26.6* 26.5* 26.6* 24.2* 26.0* 27.4*  MCV 97.2  --  96.6  --  96.7 91.7  --   --  90.9  --   PLT 191  --  195  --  194 158  --   --  145*  --    < > = values in this interval not displayed.   Basic Metabolic Panel: Recent Labs  Lab 12/27/17 1034 12/28/17 0346 12/28/17 2013 12/29/17 0724 12/30/17 0437  NA 141 139 138 137 140  K 3.9 4.0 3.4* 4.5 4.8  CL 107 109 110 111 112*  CO2 25 26 23 23 25   GLUCOSE 174* 110* 147* 116* 123*  BUN 15 15 17 18 12   CREATININE 1.09 0.96 1.00 0.95 0.92  CALCIUM 8.5* 8.4* 8.1* 7.6* 8.2*   GFR: Estimated Creatinine Clearance: 87.7 mL/min (by C-G formula based on SCr of 0.92 mg/dL). Liver Function Tests: Recent Labs  Lab 12/27/17 1034 12/28/17 2013  AST 17 19  ALT 18 18  ALKPHOS 40 38  BILITOT 0.7 0.2*  PROT 6.1* 5.4*  ALBUMIN 3.1* 2.9*   No results for input(s): LIPASE, AMYLASE in the last 168 hours. No results for input(s): AMMONIA in the last 168 hours. Coagulation Profile: Recent Labs  Lab 12/27/17 1034  INR 1.14   Cardiac Enzymes: Recent Labs  Lab 12/27/17 1034  TROPONINI <0.03   BNP (last 3 results) No results for input(s): PROBNP in the last 8760 hours. HbA1C: No results for input(s): HGBA1C in the last 72  hours. CBG: Recent Labs  Lab 12/28/17 2006 12/29/17 1956  GLUCAP 130* 100*   Lipid Profile: No results for input(s): CHOL, HDL, LDLCALC, TRIG, CHOLHDL, LDLDIRECT in the last 72 hours. Thyroid Function Tests: No results for input(s): TSH, T4TOTAL, FREET4, T3FREE, THYROIDAB in the last 72 hours. Anemia Panel: No results for input(s): VITAMINB12, FOLATE, FERRITIN, TIBC, IRON, RETICCTPCT in the last 72 hours. Urine analysis:    Component Value Date/Time   COLORURINE YELLOW 05/09/2017 1705   APPEARANCEUR HAZY (A) 05/09/2017 1705   LABSPEC 1.017 05/09/2017 1705   PHURINE 5.0 05/09/2017 1705   GLUCOSEU NEGATIVE 05/09/2017 1705   HGBUR MODERATE (A) 05/09/2017 1705   BILIRUBINUR NEGATIVE 05/09/2017 [place] 05/09/2017 1705   PROTEINUR NEGATIVE 05/09/2017 1705   UROBILINOGEN 0.2 09/13/2010 0850   NITRITE NEGATIVE 05/09/2017 1705   LEUKOCYTESUR  SMALL (A) 05/09/2017 1705   Sepsis Labs: @LABRCNTIP (procalcitonin:4,lacticidven:4)  ) Recent Results (from the past 240 hour(s))  MRSA PCR Screening     Status: None   Collection Time: 12/28/17 11:36 PM  Result Value Ref Range Status   MRSA by PCR NEGATIVE NEGATIVE Final    Comment:        The GeneXpert MRSA Assay (FDA approved for NASAL specimens only), is one component of a comprehensive MRSA colonization surveillance program. It is not intended to diagnose MRSA infection nor to guide or monitor treatment for MRSA infections. Performed at [facility], [address], [place], [place] [address]       Radiology Studies: No results found.   Scheduled Meds: . sodium chloride flush  3 mL Intravenous Q12H  . tiotropium  18 mcg Inhalation Daily   Continuous Infusions: . sodium chloride       LOS: 1 day   Time Spent in minutes   30 minutes   Anselma Herbel D.O. on 12/30/2017 at 2:20 PM  Between 7am to 7pm - Please see pager noted on [email or web address]  After 7pm go to [email or web address]  And look for the night  coverage person covering for me after hours  [facility] Office  [phone]

## 2017-12-30 NOTE — Progress Notes (Signed)
Called report to 300 RN. Patient going to 315 and will be on Telemetry. Rolled to room by wheel chair.

## 2017-12-31 ENCOUNTER — Encounter (HOSPITAL_COMMUNITY): Payer: Self-pay

## 2017-12-31 ENCOUNTER — Inpatient Hospital Stay (HOSPITAL_COMMUNITY): Payer: Medicare HMO

## 2017-12-31 DIAGNOSIS — R55 Syncope and collapse: Secondary | ICD-10-CM

## 2017-12-31 LAB — TYPE AND SCREEN
ABO/RH(D): B POS
Antibody Screen: NEGATIVE
UNIT DIVISION: 0
UNIT DIVISION: 0
Unit division: 0
Unit division: 0

## 2017-12-31 LAB — HEMOGLOBIN AND HEMATOCRIT, BLOOD
HCT: 23.5 % — ABNORMAL LOW (ref 39.0–52.0)
HCT: 26.1 % — ABNORMAL LOW (ref 39.0–52.0)
HEMOGLOBIN: 8.5 g/dL — AB (ref 13.0–17.0)
Hemoglobin: 7.3 g/dL — ABNORMAL LOW (ref 13.0–17.0)

## 2017-12-31 LAB — BPAM RBC
BLOOD PRODUCT EXPIRATION DATE: 201911022359
BLOOD PRODUCT EXPIRATION DATE: 201911022359
Blood Product Expiration Date: 201910172359
Blood Product Expiration Date: 201910182359
ISSUE DATE / TIME: 201910062230
ISSUE DATE / TIME: 201910070109
ISSUE DATE / TIME: 201910072226
UNIT TYPE AND RH: 1700
Unit Type and Rh: 1700
Unit Type and Rh: 1700
Unit Type and Rh: 1700

## 2017-12-31 LAB — PREPARE RBC (CROSSMATCH)

## 2017-12-31 MED ORDER — SODIUM CHLORIDE 0.9% FLUSH
INTRAVENOUS | Status: AC
  Filled 2017-12-31: qty 40

## 2017-12-31 MED ORDER — TECHNETIUM TC 99M-LABELED RED BLOOD CELLS IV KIT
25.0000 | PACK | Freq: Once | INTRAVENOUS | Status: AC | PRN
  Administered 2017-12-31: 22.5 via INTRAVENOUS

## 2017-12-31 MED ORDER — SODIUM CHLORIDE 0.9% IV SOLUTION
Freq: Once | INTRAVENOUS | Status: AC
  Administered 2017-12-31: 04:00:00 via INTRAVENOUS

## 2017-12-31 MED ORDER — HEPARIN SOD (PORK) LOCK FLUSH 100 UNIT/ML IV SOLN
INTRAVENOUS | Status: AC
  Filled 2017-12-31: qty 5

## 2017-12-31 MED ORDER — POTASSIUM CHLORIDE CRYS ER 20 MEQ PO TBCR: 20.0000 meq | EXTENDED_RELEASE_TABLET | Freq: Once | ORAL | Status: DC

## 2017-12-31 NOTE — Progress Notes (Addendum)
PROGRESS NOTE  Randy Russo KXF:[id number] DOB: 02-Nov-1940 DOA: 12/27/2017 PCP: Sharilyn Sites, MD  Brief History:  [name] a 77 y.o.malewith a medical history ofhypertension, COPD, who presented to the emergency department with complaints of almost passing out as well as a GI bleed. It seems that patient had gotten up this morning to use the bathroom and had a bowel movement. He was unable to get up from the commode, had fallen backwards at which point his eyes rolled back into his head and he had some shaking. Was reported by patient's wife. Patient does not recall these events. Patient's wife does not note any bloody bowel movement. Per EMS patient had a large bloody bowel movement. Patient does feel weak and dizzy at this time. Denies current chest pain, shortness of breath, abdominal pain, nausea or vomiting, constipation, problems with urination, numbness or tingling, recent illness or travel.  Interim history Admitted for near syncope secondary to GI Bleed. GI consulted, s/p colonoscopy.  During the hospitalization, pt had 2 episodes of syncope which were likely vagally mediated.  He continued to have hematochezia after the colonoscopy.  Patient was given IVF as well as 5u PRBCs for the admission.  NM tagged RBC scan was ordered  Assessment/Plan: GI bleed/ Hematochezia/ Diverticular bleed/ Acute on chronic normocytic anemia/ Symptomatic anemia/ Anemia of acute blood loss -presented with hypotension, near syncopal event -Hemoglobin on admission 11.1.  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 None with stigmata of bleed. Small external hemorrhoids. -Overnight on 10/6-7, patient continued to have several episodes of painless hematochezia as well as a syncopal event -overnight 10/8-9--pt had another  syncopal episode with hematochezia -Patient has been transfused 5 units PRBC thus far -Hemoglobin currently 8.5 -Continue to monitor CBC -Per gastroenterology, continue to monitor, if bleeding restarts would obtain bleeding scan.  -12/31/17--NM RBC scan--  Syncope -likely secondary to GI bleed/ hypotension-vasovagal -currently no neurological deficits -12/31/17 CT brain--neg  -Continue to monitor   Hypotension -Resolved with IV resuscitation and blood transfusions  Essential hypertension -presented with hypotension -hold lisinopril and monitor   COPD -Currently stable. No wheezing on exam -continue Spiriva and albuterol PRN  Weakness -suspect secondary to anemia/GI bleed -PT consulted and recommended HH, however patient has declined     Disposition Plan:   Home when hematochezia resolves Family Communication:   Spouse updated at bedside  Consultants:  GI--[name]  Code Status:  FULL   DVT Prophylaxis:  SCDs   Procedures: As Listed in Progress Note Above  Antibiotics: None    Subjective: Patient denies fevers, chills, headache, chest pain, dyspnea, nausea, vomiting, diarrhea, abdominal pain, dysuria, hematuria, hematochezia, and melena.   Objective: Vitals:   12/31/17 0806 12/31/17 0821 12/31/17 0850 12/31/17 1130  BP: 125/67  105/64 121/65  Pulse: 99  (!) 102 89  Resp: 18  18 18   Temp: 98.8 F (37.1 C)  98.8 F (37.1 C) 98.8 F (37.1 C)  TempSrc: Oral  Oral Oral  SpO2: 97% 100% 96% 97%  Weight:      Height:        Intake/Output Summary (Last 24 hours) at 12/31/2017 1348 Last data filed at 12/31/2017 0835 Gross per 24 hour  Intake 1205 ml  Output 500 ml  Net 705 ml   Weight change:  Exam:   General:  Pt is alert,  follows commands appropriately, not in acute distress  HEENT: No icterus, No thrush, No neck mass, [place]/AT  Cardiovascular: RRR, S1/S2, no rubs, no gallops  Respiratory: bibasilar crackles, no wheeze  Abdomen: Soft/+BS, non  tender, non distended, no guarding  Extremities: No edema, No lymphangitis, No petechiae, No rashes, no synovitis   Data Reviewed: I have personally reviewed following labs and imaging studies Basic Metabolic Panel: Recent Labs  Lab 12/27/17 1034 12/28/17 0346 12/28/17 2013 12/29/17 0724 12/30/17 0437  NA 141 139 138 137 140  K 3.9 4.0 3.4* 4.5 4.8  CL 107 109 110 111 112*  CO2 25 26 23 23 25   GLUCOSE 174* 110* 147* 116* 123*  BUN 15 15 17 18 12   CREATININE 1.09 0.96 1.00 0.95 0.92  CALCIUM 8.5* 8.4* 8.1* 7.6* 8.2*   Liver Function Tests: Recent Labs  Lab 12/27/17 1034 12/28/17 2013  AST 17 19  ALT 18 18  ALKPHOS 40 38  BILITOT 0.7 0.2*  PROT 6.1* 5.4*  ALBUMIN 3.1* 2.9*   No results for input(s): LIPASE, AMYLASE in the last 168 hours. No results for input(s): AMMONIA in the last 168 hours. Coagulation Profile: Recent Labs  Lab 12/27/17 1034  INR 1.14   CBC: Recent Labs  Lab 12/27/17 1034  12/28/17 0346  12/28/17 2013 12/29/17 0528  12/30/17 0437 12/30/17 1341 12/30/17 1657 12/31/17 0048 12/31/17 1322  WBC 8.1  --  7.8  --  12.0* 13.8*  --  12.1*  --   --   --   --   HGB 11.1*   < > 9.9*   < > 8.5* 8.9*   < > 8.7* 8.8* 8.4* 7.3* 8.5*  HCT 34.6*   < > 30.8*   < > 26.6* 26.5*   < > 26.0* 27.4* 26.3* 23.5* 26.1*  MCV 97.2  --  96.6  --  96.7 91.7  --  90.9  --   --   --   --   PLT 191  --  195  --  194 158  --  145*  --   --   --   --    < > = values in this interval not displayed.   Cardiac Enzymes: Recent Labs  Lab 12/27/17 1034  TROPONINI <0.03   BNP: Invalid input(s): POCBNP CBG: Recent Labs  Lab 12/28/17 2006 12/29/17 1956  GLUCAP 130* 100*   HbA1C: No results for input(s): HGBA1C in the last 72 hours. Urine analysis:    Component Value Date/Time   COLORURINE YELLOW 05/09/2017 1705   APPEARANCEUR HAZY (A) 05/09/2017 1705   LABSPEC 1.017 05/09/2017 1705   PHURINE 5.0 05/09/2017 1705   GLUCOSEU NEGATIVE 05/09/2017 1705   HGBUR  MODERATE (A) 05/09/2017 1705   BILIRUBINUR NEGATIVE 05/09/2017 1705   KETONESUR NEGATIVE 05/09/2017 1705   PROTEINUR NEGATIVE 05/09/2017 1705   UROBILINOGEN 0.2 09/13/2010 0850   NITRITE NEGATIVE 05/09/2017 1705   LEUKOCYTESUR SMALL (A) 05/09/2017 1705   Sepsis Labs: @LABRCNTIP (procalcitonin:4,lacticidven:4) ) Recent Results (from the past 240 hour(s))  MRSA PCR Screening     Status: None   Collection Time: 12/28/17 11:36 PM  Result Value Ref Range Status   MRSA by PCR NEGATIVE NEGATIVE Final    Comment:        The GeneXpert MRSA Assay (FDA approved for NASAL specimens only), is one component of a comprehensive MRSA colonization surveillance program. It is not intended to diagnose MRSA infection nor to guide or monitor treatment for MRSA infections.  Performed at [facility], [address], [place], [place] [address]      Scheduled Meds: . heparin lock flush      . sodium chloride flush      . sodium chloride flush  3 mL Intravenous Q12H  . tiotropium  18 mcg Inhalation Daily   Continuous Infusions: . sodium chloride      Procedures/Studies: Ct Head Wo Contrast  Result Date: 12/31/2017 CLINICAL DATA:  Head trauma. Syncope. Impact to the back of the head. EXAM: CT HEAD WITHOUT CONTRAST TECHNIQUE: Contiguous axial images were obtained from the base of the skull through the vertex without intravenous contrast. COMPARISON:  Head CT 11/03/2007 FINDINGS: Brain: There is no mass, hemorrhage or extra-axial collection. The size and configuration of the ventricles and extra-axial CSF spaces are normal. There is no acute or chronic infarction. There is hypoattenuation of the periventricular white matter, most commonly indicating chronic ischemic microangiopathy. Vascular: No abnormal hyperdensity of the major intracranial arteries or dural venous sinuses. No intracranial atherosclerosis. Skull: The visualized skull base, calvarium and extracranial soft tissues are normal.  Sinuses/Orbits: No fluid levels or advanced mucosal thickening of the visualized paranasal sinuses. No mastoid or middle ear effusion. The orbits are normal. 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 Electronically Signed   By: Ulyses Jarred M.D.   On: 12/31/2017 01:45    Orson Eva, DO  [facility] Pager [phone]  If 7PM-7AM, please contact night-coverage [email or web address] Password [email or web address] 12/31/2017, 1:48 PM   LOS: 2 days

## 2017-12-31 NOTE — Progress Notes (Signed)
Pt ambulated with assistance to commode. Pt experienced syncopal episode on toilet with staff present and assisting. Primary nurse was not present during event. Vital signs obtained, stable. Pt returned to bed, alert/oriented. Scheduled H/H obtained, awaiting results. Dr D. Olevia Bowens notified and immediately to pt bedside for assessment.

## 2017-12-31 NOTE — Care Management Important Message (Signed)
Important Message  Patient Details  Name: Randy Russo MRN: [id number] Date of Birth: [birth year]   Medicare Important Message Given:  Yes    Shelda Altes 12/31/2017, 11:40 AM

## 2017-12-31 NOTE — Progress Notes (Signed)
Recent H/H 7.3, previous H/H 8.4. New orders to transfuse 2 units of PRBC from D, Olevia Bowens. Will transfuse once PRBC complete and ready. Pt understanding of s/s of transfusion reaction.

## 2017-12-31 NOTE — Progress Notes (Signed)
Daughter Mateo Flow notified of last nights events per pt request.

## 2017-12-31 NOTE — Progress Notes (Signed)
   Subjective:  Patient states he is feeling fine but he passed large amount of blood last night just like he did on Sunday night and also prior to coming to the hospital.  He states he had head CT because when he passed passed out last night he hit his head on the wall.  He states he has been passing flatus all day but he has not passed any blood today.  He is hungry.  He denies abdominal pain.  Objective: Blood pressure 121/65, pulse 89, temperature 98.8 F (37.1 C), temperature source Oral, resp. rate 18, height 6' (1.829 m), weight 114.2 kg, SpO2 97 %. Patient is alert in no acute distress. Abdomen is full but soft and nontender with organomegaly or masses.   Labs/studies Results:  Recent Labs    2018-01-09 2013 12/29/17 0528  12/30/17 0437  12/30/17 1657 12/31/17 0048 12/31/17 1322  WBC 12.0* 13.8*  --  12.1*  --   --   --   --   HGB 8.5* 8.9*   < > 8.7*   < > 8.4* 7.3* 8.5*  HCT 26.6* 26.5*   < > 26.0*   < > 26.3* 23.5* 26.1*  PLT 194 158  --  145*  --   --   --   --    < > = values in this interval not displayed.    BMET  Recent Labs    01/09/18 2013 12/29/17 0724 12/30/17 0437  NA 138 137 140  K 3.4* 4.5 4.8  CL 110 111 112*  CO2 23 23 25   GLUCOSE 147* 116* 123*  BUN 17 18 12   CREATININE 1.00 0.95 0.92  CALCIUM 8.1* 7.6* 8.2*    LFT  Recent Labs    01/09/18 2013  PROT 5.4*  ALBUMIN 2.9*  AST 19  ALT 18  ALKPHOS 38  BILITOT 0.2*     Unenhanced head CT without acute abnormalities.  GI bleeding scan reviewed with Dr. Thornton Papas.  Active bleeding in study.  Which was 2 hours.  Assessment:  #1.  Recurrent GI bleed most likely from colonic diverticulosis.  He has received a total of 5 units of PRBCs.  And underwent colonoscopy 2 days ago revealing multiple colonic diverticuli but none with stigmata of bleed.  I suspect he is bleeding from proximal colonic diverticula.  He is not bleeding at the present time as supported by negative bleeding scan.  However if he  bleeds again would consider urgent colonoscopy.  In that case would also request surgical consultation.  #2.  Anemia secondary to GI bleed.  Patient has received 5 units of PRBCs.   Recommendations:  Continue check H&H every 4-6 hours next 24 hours. Diet advanced. Monitor for rebleed in which case we will proceed with urgent colonoscopy.

## 2017-12-31 NOTE — Clinical Social Work Note (Signed)
Received CSW consult to see pt to provide information on legal aid services. Met with pt and his wife this AM. Per pt, he did not remember asking for this information and he denies need for the information. Will clear this consult.

## 2018-01-01 ENCOUNTER — Encounter (HOSPITAL_COMMUNITY)
Admission: EM | Disposition: A | Discharge status: Home Health | Payer: Self-pay | Source: Home / Self Care | Attending: Internal Medicine

## 2018-01-01 ENCOUNTER — Inpatient Hospital Stay (HOSPITAL_COMMUNITY): Payer: Medicare HMO | Admitting: Anesthesiology

## 2018-01-01 ENCOUNTER — Other Ambulatory Visit: Payer: Self-pay

## 2018-01-01 ENCOUNTER — Encounter (HOSPITAL_COMMUNITY): Payer: Self-pay

## 2018-01-01 DIAGNOSIS — K5730 Diverticulosis of large intestine without perforation or abscess without bleeding: Secondary | ICD-10-CM

## 2018-01-01 DIAGNOSIS — K625 Hemorrhage of anus and rectum: Secondary | ICD-10-CM | POA: Diagnosis not present

## 2018-01-01 DIAGNOSIS — K5731 Diverticulosis of large intestine without perforation or abscess with bleeding: Secondary | ICD-10-CM

## 2018-01-01 DIAGNOSIS — K228 Other specified diseases of esophagus: Secondary | ICD-10-CM | POA: Diagnosis not present

## 2018-01-01 DIAGNOSIS — K3189 Other diseases of stomach and duodenum: Secondary | ICD-10-CM | POA: Diagnosis not present

## 2018-01-01 DIAGNOSIS — K922 Gastrointestinal hemorrhage, unspecified: Secondary | ICD-10-CM

## 2018-01-01 HISTORY — PX: ESOPHAGOGASTRODUODENOSCOPY: SHX5428

## 2018-01-01 HISTORY — PX: COLONOSCOPY: SHX5424

## 2018-01-01 HISTORY — PX: PARTIAL COLECTOMY: SHX5273

## 2018-01-01 LAB — CBC WITH DIFFERENTIAL/PLATELET
ABS IMMATURE GRANULOCYTES: 0.31 10*3/uL — AB (ref 0.00–0.07)
BASOS PCT: 1 %
Basophils Absolute: 0.1 10*3/uL (ref 0.0–0.1)
Eosinophils Absolute: 0.3 10*3/uL (ref 0.0–0.5)
Eosinophils Relative: 2 %
HCT: 24.2 % — ABNORMAL LOW (ref 39.0–52.0)
Hemoglobin: 7.6 g/dL — ABNORMAL LOW (ref 13.0–17.0)
IMMATURE GRANULOCYTES: 2 %
Lymphocytes Relative: 22 %
Lymphs Abs: 2.8 10*3/uL (ref 0.7–4.0)
MCH: 29.2 pg (ref 26.0–34.0)
MCHC: 31.4 g/dL (ref 30.0–36.0)
MCV: 93.1 fL (ref 80.0–100.0)
Monocytes Absolute: 1.3 10*3/uL — ABNORMAL HIGH (ref 0.1–1.0)
Monocytes Relative: 10 %
NEUTROS ABS: 8.3 10*3/uL — AB (ref 1.7–7.7)
NEUTROS PCT: 63 %
PLATELETS: 171 10*3/uL (ref 150–400)
RBC: 2.6 MIL/uL — AB (ref 4.22–5.81)
RDW: 17.2 % — ABNORMAL HIGH (ref 11.5–15.5)
WBC: 13 10*3/uL — AB (ref 4.0–10.5)
nRBC: 2.4 % — ABNORMAL HIGH (ref 0.0–0.2)

## 2018-01-01 LAB — HEMOGLOBIN AND HEMATOCRIT, BLOOD
HCT: 25.8 % — ABNORMAL LOW (ref 39.0–52.0)
HEMATOCRIT: 29.1 % — AB (ref 39.0–52.0)
HEMATOCRIT: 19.6 % — AB (ref 39.0–52.0)
HEMOGLOBIN: 9.5 g/dL — AB (ref 13.0–17.0)
Hemoglobin: 8.3 g/dL — ABNORMAL LOW (ref 13.0–17.0)
Hemoglobin: 6.2 g/dL — CL (ref 13.0–17.0)

## 2018-01-01 LAB — BASIC METABOLIC PANEL
Anion gap: 5 (ref 5–15)
BUN: 17 mg/dL (ref 8–23)
CHLORIDE: 110 mmol/L (ref 98–111)
CO2: 24 mmol/L (ref 22–32)
Calcium: 7.9 mg/dL — ABNORMAL LOW (ref 8.9–10.3)
Creatinine, Ser: 1 mg/dL (ref 0.61–1.24)
GFR calc non Af Amer: >60 mL/min (ref >60)
Glucose, Bld: 108 mg/dL — ABNORMAL HIGH (ref 70–99)
POTASSIUM: 4.1 mmol/L (ref 3.5–5.1)
SODIUM: 139 mmol/L (ref 135–145)

## 2018-01-01 LAB — PREPARE RBC (CROSSMATCH)

## 2018-01-01 LAB — MAGNESIUM: MAGNESIUM: 2.2 mg/dL (ref 1.7–2.4)

## 2018-01-01 SURGERY — COLECTOMY, PARTIAL
Anesthesia: General | Site: Abdomen

## 2018-01-01 SURGERY — EGD (ESOPHAGOGASTRODUODENOSCOPY)
Anesthesia: Moderate Sedation

## 2018-01-01 SURGERY — COLECTOMY, PARTIAL
Anesthesia: General

## 2018-01-01 MED ORDER — MIDAZOLAM HCL 5 MG/5ML IJ SOLN
INTRAMUSCULAR | Status: DC | PRN
  Administered 2018-01-01: 2 mg via INTRAVENOUS
  Administered 2018-01-01 (×3): 1 mg via INTRAVENOUS

## 2018-01-01 MED ORDER — SODIUM CHLORIDE 0.9% IV SOLUTION
Freq: Once | INTRAVENOUS | Status: AC
  Administered 2018-01-01: 22:00:00 via INTRAVENOUS

## 2018-01-01 MED ORDER — SODIUM CHLORIDE 0.9 % IV SOLN: Freq: Once | INTRAVENOUS | Status: DC

## 2018-01-01 MED ORDER — EPINEPHRINE PF 1 MG/10ML IJ SOSY
PREFILLED_SYRINGE | INTRAMUSCULAR | Status: AC
  Filled 2018-01-01: qty 10

## 2018-01-01 MED ORDER — ACETAMINOPHEN 325 MG PO TABS: 650.0000 mg | ORAL_TABLET | Freq: Four times a day (QID) | ORAL | Status: DC | PRN

## 2018-01-01 MED ORDER — BUPIVACAINE LIPOSOME 1.3 % IJ SUSP
INTRAMUSCULAR | Status: DC | PRN
  Administered 2018-01-01: 20 mL

## 2018-01-01 MED ORDER — SODIUM CHLORIDE 0.9 % IJ SOLN
INTRAMUSCULAR | Status: AC
  Filled 2018-01-01: qty 10

## 2018-01-01 MED ORDER — FENTANYL CITRATE (PF) 250 MCG/5ML IJ SOLN
INTRAMUSCULAR | Status: AC
  Filled 2018-01-01: qty 5

## 2018-01-01 MED ORDER — SUCCINYLCHOLINE CHLORIDE 20 MG/ML IJ SOLN
INTRAMUSCULAR | Status: AC
  Filled 2018-01-01: qty 1

## 2018-01-01 MED ORDER — ACETAMINOPHEN 650 MG RE SUPP: 650.0000 mg | Freq: Four times a day (QID) | RECTAL | Status: DC | PRN

## 2018-01-01 MED ORDER — POVIDONE-IODINE 10 % EX OINT
TOPICAL_OINTMENT | CUTANEOUS | Status: AC
  Filled 2018-01-01: qty 1

## 2018-01-01 MED ORDER — SODIUM CHLORIDE 0.9 % IV SOLN
INTRAVENOUS | Status: AC
  Filled 2018-01-01: qty 1

## 2018-01-01 MED ORDER — SODIUM CHLORIDE 0.9 % IV SOLN
INTRAVENOUS | Status: DC
  Administered 2018-01-01 – 2018-01-03 (×4): via INTRAVENOUS
  Administered 2018-01-05: 50 mL/h via INTRAVENOUS

## 2018-01-01 MED ORDER — BUPIVACAINE LIPOSOME 1.3 % IJ SUSP
INTRAMUSCULAR | Status: AC
  Filled 2018-01-01: qty 20

## 2018-01-01 MED ORDER — PHENYLEPHRINE 40 MCG/ML (10ML) SYRINGE FOR IV PUSH (FOR BLOOD PRESSURE SUPPORT)
PREFILLED_SYRINGE | INTRAVENOUS | Status: AC
  Filled 2018-01-01: qty 10

## 2018-01-01 MED ORDER — FENTANYL CITRATE (PF) 100 MCG/2ML IJ SOLN
INTRAMUSCULAR | Status: DC | PRN
  Administered 2018-01-01: 50 ug via INTRAVENOUS
  Administered 2018-01-01: 100 ug via INTRAVENOUS
  Administered 2018-01-01: 25 ug via INTRAVENOUS
  Administered 2018-01-01: 75 ug via INTRAVENOUS

## 2018-01-01 MED ORDER — PHENYLEPHRINE HCL 10 MG/ML IJ SOLN
INTRAMUSCULAR | Status: DC | PRN
  Administered 2018-01-01 (×3): 100 ug via INTRAVENOUS

## 2018-01-01 MED ORDER — CHLORHEXIDINE GLUCONATE CLOTH 2 % EX PADS: 6.0000 | MEDICATED_PAD | Freq: Once | CUTANEOUS | Status: DC

## 2018-01-01 MED ORDER — SODIUM CHLORIDE 0.9% IV SOLUTION: Freq: Once | INTRAVENOUS | Status: DC

## 2018-01-01 MED ORDER — SODIUM CHLORIDE 0.9 % IV SOLN: INTRAVENOUS | Status: DC

## 2018-01-01 MED ORDER — POVIDONE-IODINE 10 % OINT PACKET
TOPICAL_OINTMENT | CUTANEOUS | Status: DC | PRN
  Administered 2018-01-01: 1 via TOPICAL

## 2018-01-01 MED ORDER — ONDANSETRON HCL 4 MG/2ML IJ SOLN
4.0000 mg | Freq: Four times a day (QID) | INTRAMUSCULAR | Status: DC | PRN
  Administered 2018-01-02: 4 mg via INTRAVENOUS
  Filled 2018-01-01: qty 2

## 2018-01-01 MED ORDER — ROCURONIUM BROMIDE 50 MG/5ML IV SOLN
INTRAVENOUS | Status: AC
  Filled 2018-01-01: qty 1

## 2018-01-01 MED ORDER — LIDOCAINE VISCOUS HCL 2 % MT SOLN
OROMUCOSAL | Status: DC | PRN
  Administered 2018-01-01: 1 via OROMUCOSAL

## 2018-01-01 MED ORDER — PROMETHAZINE HCL 25 MG/ML IJ SOLN: 6.2500 mg | INTRAMUSCULAR | Status: DC | PRN

## 2018-01-01 MED ORDER — SUCCINYLCHOLINE CHLORIDE 20 MG/ML IJ SOLN
INTRAMUSCULAR | Status: DC | PRN
  Administered 2018-01-01: 120 mg via INTRAVENOUS

## 2018-01-01 MED ORDER — ONDANSETRON 4 MG PO TBDP
4.0000 mg | ORAL_TABLET | Freq: Four times a day (QID) | ORAL | Status: DC | PRN
  Administered 2018-01-04 – 2018-01-05 (×2): 4 mg via ORAL
  Filled 2018-01-01 (×2): qty 1

## 2018-01-01 MED ORDER — SUGAMMADEX SODIUM 200 MG/2ML IV SOLN
INTRAVENOUS | Status: AC
  Filled 2018-01-01: qty 2

## 2018-01-01 MED ORDER — PROPOFOL 10 MG/ML IV BOLUS
INTRAVENOUS | Status: AC
  Filled 2018-01-01: qty 40

## 2018-01-01 MED ORDER — PEG 3350-KCL-NA BICARB-NACL 420 G PO SOLR
4000.0000 mL | Freq: Once | ORAL | Status: AC
  Administered 2018-01-01: 4000 mL via ORAL
  Filled 2018-01-01: qty 4000

## 2018-01-01 MED ORDER — 0.9 % SODIUM CHLORIDE (POUR BTL) OPTIME
TOPICAL | Status: DC | PRN
  Administered 2018-01-01: 3000 mL

## 2018-01-01 MED ORDER — HYDROCODONE-ACETAMINOPHEN 5-325 MG PO TABS
1.0000 | ORAL_TABLET | ORAL | Status: DC | PRN
  Administered 2018-01-02 – 2018-01-03 (×4): 2 via ORAL
  Administered 2018-01-04: 1 via ORAL
  Administered 2018-01-04: 2 via ORAL
  Administered 2018-01-05: 1 via ORAL
  Administered 2018-01-06: 2 via ORAL
  Administered 2018-01-06 (×2): 1 via ORAL
  Administered 2018-01-07 – 2018-01-08 (×3): 2 via ORAL
  Filled 2018-01-01: qty 1
  Filled 2018-01-01: qty 2
  Filled 2018-01-01: qty 1
  Filled 2018-01-01 (×6): qty 2
  Filled 2018-01-01 (×2): qty 1
  Filled 2018-01-01 (×2): qty 2

## 2018-01-01 MED ORDER — MEPERIDINE HCL 50 MG/ML IJ SOLN
INTRAMUSCULAR | Status: AC
  Filled 2018-01-01: qty 1

## 2018-01-01 MED ORDER — LACTATED RINGERS IV SOLN
INTRAVENOUS | Status: DC | PRN
  Administered 2018-01-01: 18:00:00 via INTRAVENOUS

## 2018-01-01 MED ORDER — MIDAZOLAM HCL 2 MG/2ML IJ SOLN: 0.5000 mg | Freq: Once | INTRAMUSCULAR | Status: DC | PRN

## 2018-01-01 MED ORDER — LIDOCAINE HCL (PF) 1 % IJ SOLN
INTRAMUSCULAR | Status: AC
  Filled 2018-01-01: qty 5

## 2018-01-01 MED ORDER — HYDROCODONE-ACETAMINOPHEN 7.5-325 MG PO TABS: 1.0000 | ORAL_TABLET | Freq: Once | ORAL | Status: DC | PRN

## 2018-01-01 MED ORDER — SODIUM CHLORIDE 0.9 % IV SOLN
INTRAVENOUS | Status: DC
  Administered 2018-01-01: 15:00:00 via INTRAVENOUS

## 2018-01-01 MED ORDER — HYDROMORPHONE HCL 1 MG/ML IJ SOLN: 0.2500 mg | INTRAMUSCULAR | Status: DC | PRN

## 2018-01-01 MED ORDER — PANTOPRAZOLE SODIUM 40 MG PO TBEC
40.0000 mg | DELAYED_RELEASE_TABLET | Freq: Every day | ORAL | Status: DC
  Administered 2018-01-02 – 2018-01-07 (×6): 40 mg via ORAL
  Filled 2018-01-01 (×6): qty 1

## 2018-01-01 MED ORDER — PROPOFOL 10 MG/ML IV BOLUS
INTRAVENOUS | Status: DC | PRN
  Administered 2018-01-01: 120 mg via INTRAVENOUS

## 2018-01-01 MED ORDER — PHENYLEPHRINE HCL 10 MG/ML IJ SOLN
INTRAMUSCULAR | Status: AC
  Filled 2018-01-01: qty 1

## 2018-01-01 MED ORDER — LACTATED RINGERS IV SOLN: INTRAVENOUS | Status: DC

## 2018-01-01 MED ORDER — SODIUM CHLORIDE 0.9 % IV SOLN
1.0000 g | Freq: Once | INTRAVENOUS | Status: AC
  Administered 2018-01-01: 1 g via INTRAVENOUS

## 2018-01-01 MED ORDER — EPHEDRINE SULFATE 50 MG/ML IJ SOLN
INTRAMUSCULAR | Status: AC
  Filled 2018-01-01: qty 1

## 2018-01-01 MED ORDER — SIMETHICONE 80 MG PO CHEW
40.0000 mg | CHEWABLE_TABLET | Freq: Four times a day (QID) | ORAL | Status: DC | PRN
  Administered 2018-01-04 – 2018-01-06 (×4): 40 mg via ORAL
  Filled 2018-01-01 (×4): qty 1

## 2018-01-01 MED ORDER — MIDAZOLAM HCL 5 MG/5ML IJ SOLN
INTRAMUSCULAR | Status: AC
  Filled 2018-01-01: qty 10

## 2018-01-01 MED ORDER — HYDROMORPHONE HCL 1 MG/ML IJ SOLN
1.0000 mg | INTRAMUSCULAR | Status: DC | PRN
  Administered 2018-01-01 – 2018-01-02 (×3): 1 mg via INTRAVENOUS
  Filled 2018-01-01 (×3): qty 1

## 2018-01-01 MED ORDER — SODIUM CHLORIDE 0.9 % IV BOLUS
500.0000 mL | Freq: Once | INTRAVENOUS | Status: AC
  Administered 2018-01-01: 500 mL via INTRAVENOUS

## 2018-01-01 MED ORDER — SODIUM CHLORIDE 0.9% IV SOLUTION
Freq: Once | INTRAVENOUS | Status: AC
  Administered 2018-01-01: 1000 mL via INTRAVENOUS

## 2018-01-01 MED ORDER — MEPERIDINE HCL 50 MG/ML IJ SOLN
INTRAMUSCULAR | Status: DC | PRN
  Administered 2018-01-01: 25 mg via INTRAVENOUS

## 2018-01-01 MED ORDER — ROCURONIUM BROMIDE 100 MG/10ML IV SOLN
INTRAVENOUS | Status: DC | PRN
  Administered 2018-01-01: 50 mg via INTRAVENOUS

## 2018-01-01 MED ORDER — LIDOCAINE VISCOUS HCL 2 % MT SOLN
OROMUCOSAL | Status: AC
  Filled 2018-01-01: qty 15

## 2018-01-01 MED ORDER — SUGAMMADEX SODIUM 500 MG/5ML IV SOLN
INTRAVENOUS | Status: DC | PRN
  Administered 2018-01-01: 228.4 mg via INTRAVENOUS

## 2018-01-01 SURGICAL SUPPLY — 47 items
BARRIER SKIN 2 3/4 (OSTOMY) IMPLANT
BARRIER SKIN 2 3/4 INCH (OSTOMY)
CLAMP POUCH DRAINAGE QUIET (OSTOMY) IMPLANT
COVER LIGHT HANDLE STERIS (MISCELLANEOUS) ×6 IMPLANT
DRSG OPSITE POSTOP 4X10 (GAUZE/BANDAGES/DRESSINGS) ×3 IMPLANT
ELECT REM PT RETURN 9FT ADLT (ELECTROSURGICAL) ×3
ELECTRODE REM PT RTRN 9FT ADLT (ELECTROSURGICAL) ×1 IMPLANT
GLOVE BIO SURGEON STRL SZ 6.5 (GLOVE) ×4 IMPLANT
GLOVE BIO SURGEONS STRL SZ 6.5 (GLOVE) ×2
GLOVE BIOGEL PI IND STRL 6.5 (GLOVE) ×2 IMPLANT
GLOVE BIOGEL PI IND STRL 7.0 (GLOVE) ×5 IMPLANT
GLOVE BIOGEL PI INDICATOR 6.5 (GLOVE) ×4
GLOVE BIOGEL PI INDICATOR 7.0 (GLOVE) ×10
GLOVE SURG SS PI 7.5 STRL IVOR (GLOVE) ×6 IMPLANT
GOWN STRL REUS W/ TWL XL LVL3 (GOWN DISPOSABLE) ×2 IMPLANT
GOWN STRL REUS W/TWL LRG LVL3 (GOWN DISPOSABLE) ×12 IMPLANT
GOWN STRL REUS W/TWL XL LVL3 (GOWN DISPOSABLE) ×4
INST SET MAJOR GENERAL (KITS) ×3 IMPLANT
KIT TURNOVER KIT A (KITS) ×3 IMPLANT
LIGASURE IMPACT 36 18CM CVD LR (INSTRUMENTS) ×3 IMPLANT
MANIFOLD NEPTUNE II (INSTRUMENTS) ×3 IMPLANT
NEEDLE HYPO 18GX1.5 BLUNT FILL (NEEDLE) ×3 IMPLANT
NS IRRIG 1000ML POUR BTL (IV SOLUTION) ×6 IMPLANT
PACK COLON (CUSTOM PROCEDURE TRAY) ×3 IMPLANT
PAD ARMBOARD 7.5X6 YLW CONV (MISCELLANEOUS) ×3 IMPLANT
PENCIL HANDSWITCHING (ELECTRODE) ×3 IMPLANT
POUCH OSTOMY 2 3/4  H 3804 (WOUND CARE)
POUCH OSTOMY 2 PC DRNBL 2.25 (WOUND CARE) IMPLANT
POUCH OSTOMY 2 PC DRNBL 2.75 (WOUND CARE) IMPLANT
POUCH OSTOMY DRNBL 2 1/4 (WOUND CARE)
RELOAD PROXIMATE 75MM BLUE (ENDOMECHANICALS) ×6 IMPLANT
RETRACTOR WND ALEXIS 25 LRG (MISCELLANEOUS) ×1 IMPLANT
RTRCTR WOUND ALEXIS 25CM LRG (MISCELLANEOUS) ×3
SPONGE LAP 18X18 X RAY DECT (DISPOSABLE) ×6 IMPLANT
STAPLER GUN LINEAR PROX 60 (STAPLE) ×3 IMPLANT
STAPLER PROXIMATE 75MM BLUE (STAPLE) ×3 IMPLANT
STAPLER VISISTAT (STAPLE) ×3 IMPLANT
SUT CHROMIC 0 SH (SUTURE) IMPLANT
SUT CHROMIC 2 0 SH (SUTURE) IMPLANT
SUT CHROMIC 3 0 SH 27 (SUTURE) IMPLANT
SUT NOVA NAB GS-26 0 60 (SUTURE) IMPLANT
SUT PDS AB 0 CTX 60 (SUTURE) ×6 IMPLANT
SUT SILK 2 0 (SUTURE)
SUT SILK 2-0 18XBRD TIE 12 (SUTURE) IMPLANT
SUT SILK 3 0 SH CR/8 (SUTURE) ×3 IMPLANT
TRAY FOLEY MTR SLVR 16FR STAT (SET/KITS/TRAYS/PACK) ×3 IMPLANT
YANKAUER SUCT BULB TIP 10FT TU (MISCELLANEOUS) ×3 IMPLANT

## 2018-01-01 NOTE — Consult Note (Signed)
Reason for Consult: GI bleeding Referring Physician: Dr. Luther Hearing is an 77 y.o. male.  HPI: Patient is a 77 year old black male who was admitted to the hospital with near syncopal episode secondary to GI bleeding.  He underwent a colonoscopy on 12/28/17 which showed pandiverticulosis.  Since that time, he has had recurrent episodes of hematochezia.  He has received approximately 7 units of packed red blood cells.  A red cell tagged nuclear scan was negative for GI bleeding.  He did have recurrent episodes of hematochezia overnight and is receiving blood.  Patient states he has crampy abdominal pain and some nausea.  Past Medical History:  Diagnosis Date  . Arthritis   . COPD (chronic obstructive pulmonary disease) ([place])   . Hypertension     Past Surgical History:  Procedure Laterality Date  . CATARACT EXTRACTION W/PHACO Left 11/16/2012   Procedure: CATARACT EXTRACTION PHACO AND INTRAOCULAR LENS PLACEMENT (IOC);  Surgeon: Tonny Branch, MD;  Location: [facility];  Service: Ophthalmology;  Laterality: Left;  CDE: 13.66  . CATARACT EXTRACTION W/PHACO Right 01/18/2013   Procedure: CATARACT EXTRACTION PHACO AND INTRAOCULAR LENS PLACEMENT (IOC);  Surgeon: Tonny Branch, MD;  Location: [facility];  Service: Ophthalmology;  Laterality: Right;  CDE:8.79  . CERVICAL FUSION  4 yrs ago  . COLONOSCOPY N/A 12/28/2017   Procedure: COLONOSCOPY;  Surgeon: Rogene Houston, MD;  Location: [facility];  Service: Endoscopy;  Laterality: N/A;  . JOINT REPLACEMENT Left 5 yrs ago   knee  . right hip replacement Right 3 yrs ago    History reviewed. No pertinent family history.  Social History:  reports that he has been smoking cigars. He has never used smokeless tobacco. He reports that he does not drink alcohol or use drugs.  Allergies: No Known Allergies  Medications: I have reviewed the patient's current medications.  Results for orders placed or performed during the hospital encounter of 12/27/17 (from  the past 48 hour(s))  Hemoglobin and hematocrit, blood     Status: Abnormal   Collection Time: 12/30/17  1:41 PM  Result Value Ref Range   Hemoglobin 8.8 (L) 13.0 - 17.0 g/dL   HCT 27.4 (L) 39.0 - 52.0 %    Comment: Performed at [facility], [address] [place], [place] [address]  Hemoglobin and hematocrit, blood     Status: Abnormal   Collection Time: 12/30/17  4:57 PM  Result Value Ref Range   Hemoglobin 8.4 (L) 13.0 - 17.0 g/dL   HCT 26.3 (L) 39.0 - 52.0 %    Comment: Performed at [facility], [address] [place], [place] [address]  Hemoglobin and hematocrit, blood     Status: Abnormal   Collection Time: 12/31/17 12:48 AM  Result Value Ref Range   Hemoglobin 7.3 (L) 13.0 - 17.0 g/dL   HCT 23.5 (L) 39.0 - 52.0 %    Comment: Performed at [facility], [address], [place], [place] [address]  Prepare RBC     Status: None   Collection Time: 12/31/17  1:04 AM  Result Value Ref Range   Order Confirmation      ORDER PROCESSED BY BLOOD BANK Performed at [facility], [address] [place], [place] [address]   Type and screen [facility]     Status: None (Preliminary result)   Collection Time: 12/31/17  1:47 AM  Result Value Ref Range   ABO/RH(D) B POS    Antibody Screen NEG    Sample Expiration  01/03/2018    Unit Number [id number]    Blood Component Type RED CELLS,LR    Unit division 00    Status of Unit ISSUED    Transfusion Status OK TO TRANSFUSE    Crossmatch Result Compatible    Unit Number [id number]    Blood Component Type RBC LR PHER2    Unit division 00    Status of Unit ISSUED    Transfusion Status OK TO TRANSFUSE    Crossmatch Result Compatible    Unit Number [id number]    Blood Component Type RED CELLS,LR    Unit division 00    Status of Unit ISSUED    Transfusion Status OK TO TRANSFUSE    Crossmatch Result      Compatible Performed at [facility], [address], [place], [place] [address]    Unit Number [id number]    Blood  Component Type RBC LR PHER1    Unit division 00    Status of Unit ALLOCATED    Transfusion Status OK TO TRANSFUSE    Crossmatch Result Compatible   Prepare RBC     Status: None   Collection Time: 12/31/17  1:47 AM  Result Value Ref Range   Order Confirmation      ORDER PROCESSED BY BLOOD BANK Performed at [facility], [address] [place], [place] [address]   Hemoglobin and hematocrit, blood     Status: Abnormal   Collection Time: 12/31/17  1:22 PM  Result Value Ref Range   Hemoglobin 8.5 (L) 13.0 - 17.0 g/dL   HCT 26.1 (L) 39.0 - 52.0 %    Comment: Performed at [facility], [address] [place], [place] [address]  Hemoglobin and hematocrit, blood     Status: Abnormal   Collection Time: 01/01/18  1:20 AM  Result Value Ref Range   Hemoglobin 8.3 (L) 13.0 - 17.0 g/dL   HCT 25.8 (L) 39.0 - 52.0 %    Comment: Performed at [facility], [address], [place], [place] [address]  Basic metabolic panel     Status: Abnormal   Collection Time: 01/01/18  1:20 AM  Result Value Ref Range   Sodium 139 135 - 145 mmol/L   Potassium 4.1 3.5 - 5.1 mmol/L   Chloride 110 98 - 111 mmol/L   CO2 24 22 - 32 mmol/L   Glucose, Bld 108 (H) 70 - 99 mg/dL   BUN 17 8 - 23 mg/dL   Creatinine, Ser 1.00 0.61 - 1.24 mg/dL   Calcium 7.9 (L) 8.9 - 10.3 mg/dL   GFR calc non Af Amer >60 >60 mL/min   GFR calc Af Amer >60 >60 mL/min    Comment: (NOTE) The eGFR has been calculated using the CKD EPI equation. This calculation has not been validated in all clinical situations. eGFR's persistently <60 mL/min signify possible Chronic Kidney Disease.    Anion gap 5 5 - 15    Comment: Performed at [facility], [address] [place], [place] [address]  Magnesium     Status: None   Collection Time: 01/01/18  1:20 AM  Result Value Ref Range   Magnesium 2.2 1.7 - 2.4 mg/dL    Comment: Performed at [facility], [address] [place],  [address]  CBC with Differential/Platelet     Status: Abnormal    Collection Time: 01/01/18  6:08 AM  Result Value Ref Range   WBC 13.0 (H) 4.0 - 10.5 K/uL   RBC 2.60 (L) 4.22 - 5.81  MIL/uL   Hemoglobin 7.6 (L) 13.0 - 17.0 g/dL   HCT 24.2 (L) 39.0 - 52.0 %   MCV 93.1 80.0 - 100.0 fL   MCH 29.2 26.0 - 34.0 pg   MCHC 31.4 30.0 - 36.0 g/dL   RDW 17.2 (H) 11.5 - 15.5 %   Platelets 171 150 - 400 K/uL   nRBC 2.4 (H) 0.0 - 0.2 %   Neutrophils Relative % 63 %   Neutro Abs 8.3 (H) 1.7 - 7.7 K/uL   Lymphocytes Relative 22 %   Lymphs Abs 2.8 0.7 - 4.0 K/uL   Monocytes Relative 10 %   Monocytes Absolute 1.3 (H) 0.1 - 1.0 K/uL   Eosinophils Relative 2 %   Eosinophils Absolute 0.3 0.0 - 0.5 K/uL   Basophils Relative 1 %   Basophils Absolute 0.1 0.0 - 0.1 K/uL   Immature Granulocytes 2 %   Abs Immature Granulocytes 0.31 (H) 0.00 - 0.07 K/uL    Comment: Performed at [facility], [address], [place], [place] [address]    Ct Head Wo Contrast  Result Date: 12/31/2017 CLINICAL DATA:  Head trauma. Syncope. Impact to the back of the head. EXAM: CT HEAD WITHOUT CONTRAST TECHNIQUE: Contiguous axial images were obtained from the base of the skull through the vertex without intravenous contrast. COMPARISON:  Head CT 11/03/2007 FINDINGS: Brain: There is no mass, hemorrhage or extra-axial collection. The size and configuration of the ventricles and extra-axial CSF spaces are normal. There is no acute or chronic infarction. There is hypoattenuation of the periventricular white matter, most commonly indicating chronic ischemic microangiopathy. Vascular: No abnormal hyperdensity of the major intracranial arteries or dural venous sinuses. No intracranial atherosclerosis. Skull: The visualized skull base, calvarium and extracranial soft tissues are normal. Sinuses/Orbits: No fluid levels or advanced mucosal thickening of the visualized paranasal sinuses. No mastoid or middle ear effusion. The orbits are normal. 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 Electronically Signed   By: Ulyses Jarred M.D.   On: 12/31/2017 01:45   Nm Gi Blood Loss  Result Date: 12/31/2017 CLINICAL DATA:  Recurrent GI bleeding EXAM: NUCLEAR MEDICINE GASTROINTESTINAL BLEEDING SCAN TECHNIQUE: Sequential abdominal images were obtained following intravenous administration of Tc-45mlabeled red blood cells. RADIOPHARMACEUTICALS:  22.5 mCi Tc-92mertechnetate in-vitro labeled red cells. COMPARISON:  CT abdomen and pelvis 02/05/2017 FINDINGS: Normal blood pool distribution of tracer. Minimal de labeled tracer within the bladder. No abnormal gastrointestinal localization of tracer identified to suggest active GI bleeding. IMPRESSION: Negative GI bleeding scan. Electronically Signed   By: MaLavonia Dana.D.   On: 12/31/2017 14:55    ROS:  Pertinent items are noted in HPI.  Blood pressure (!) 96/53, pulse (!) 105, temperature (!) 97.5 F (36.4 C), temperature source Axillary, resp. rate 18, height 6' (1.829 m), weight 114.2 kg, SpO2 100 %. Physical Exam: Fatigue black male in no acute distress Head is normocephalic, atraumatic Lungs clear to auscultation with equal breath sounds bilaterally Heart examination reveals a regular rate and rhythm without S3, S4, murmurs Abdomen is soft, nontender, nondistended.  No rigidity noted.  Dr. ReOlevia Perchesotes reviewed.  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 ReLaural Golden Should he continue to have GI bleeding and is repeat colonoscopy is not therapeutic, patient may require a subtotal colectomy.  I did explain this to the patient and his daughter.  We will follow expectantly with you.  MaAviva Signs0/12/2017, 8:18 AM

## 2018-01-01 NOTE — Progress Notes (Signed)
PT Cancellation Note  Patient Details Name: Randy Russo MRN: [id number] DOB: 19-Apr-1940   Cancelled Treatment:    Reason Eval/Treat Not Completed: Medical issues which prohibited therapy.  Receiving another transfusion and will anticipate his ability to finish a therapy session afterward.  Try again as time and pt allow.   [name] 01/01/2018, 12:49 PM   12:49 PM, 01/01/18 Mee Hives, PT, MS Physical Therapist - [place] [phone] (Office)

## 2018-01-01 NOTE — Progress Notes (Signed)
Patient transferring to step down after endo procedures.  Report called and given to [facility].  Patient and family aware and agreeable.  Patient in NAD at this time, 2nd unit of blood is finished, patient tolerated well without complication.

## 2018-01-01 NOTE — Progress Notes (Signed)
Patient had a large bloody stool while on bed pan and had a syncopal episode. BP 94/51. Notified Dr. Olevia Bowens, new orders in place, 500cc NS bolus running at this time. CBC drawn. Patient alert and oriented, BP 116/72 at this time, at bedside. Will continue to monitor.

## 2018-01-01 NOTE — OR Nursing (Signed)
To OR for Surgery per Dr. Laural Golden and Dr. Arnoldo Morale

## 2018-01-01 NOTE — Progress Notes (Addendum)
2 units of FFP given as ordered, tolerated well. Hgb received 6.1 called to ICU nurse, Dr. Lavell Islam notified. PRBC ordered.

## 2018-01-01 NOTE — Progress Notes (Signed)
PT Cancellation Note  Patient Details Name: Randy Russo MRN: [id number] DOB: 1941-02-24   Cancelled Treatment:    Reason Eval/Treat Not Completed: Medical issues which prohibited therapy.  On bedrest due to amount of hemorrhaging from his GI bleed.  Check with nursing tomorrow to see if he is able to have session.   [name] 01/01/2018, 2:08 PM   2:08 PM, 01/01/18 Mee Hives, PT, MS Physical Therapist - [place] [phone] (Office)

## 2018-01-01 NOTE — Transfer of Care (Signed)
Immediate Anesthesia Transfer of Care Note  Patient: Randy Russo  Procedure(s) Performed: PARTIAL COLECTOMY (N/A )  Patient Location: ICU  Anesthesia Type:General  Level of Consciousness: awake  Airway & Oxygen Therapy: Patient Spontanous Breathing  Post-op Assessment: Report given to RN  Post vital signs: Reviewed and stable  Last Vitals:  Vitals Value Taken Time  BP 118/67 01/01/2018  8:00 PM  Temp    Pulse 107 01/01/2018  8:08 PM  Resp 26 01/01/2018  8:08 PM  SpO2    Vitals shown include unvalidated device data.  Last Pain:  Vitals:   01/01/18 1948  TempSrc:   PainSc: (P) 2       Patients Stated Pain Goal: 7 (88/89/16 9450)  Complications: No apparent anesthesia complications

## 2018-01-01 NOTE — Progress Notes (Signed)
PROGRESS NOTE  Randy Russo CWC:[id number] DOB: [birth year] DOA: 12/27/2017 PCP: Sharilyn Sites, MD  Brief History:  Randy Russo Wheeleris a 77 y.o.malewith a medical history ofhypertension, COPD, who presented to the emergency department with complaints of almost passing out as well as a GI bleed. It seems that patient had gotten up this morning to use the bathroom and had a bowel movement. He was unable to get up from the commode, had fallen backwards at which point his eyes rolled back into his head and he had some shaking. Was reported by patient's wife. Patient does not recall these events. Patient's wife does not note any bloody bowel movement. Per EMS patient had a large bloody bowel movement. Patient felt weak and dizzy. Denies current chest pain, shortness of breath, abdominal pain, nausea or vomiting, constipation, problems with urination, numbness or tingling, recent illness or travel.  Interim history Admitted for near syncope secondary to GI Bleed. GI consulted, s/p colonoscopy. During the hospitalization, pt had 2 episodes of syncope which were likely vagally mediated.  He continued to have hematochezia after the colonoscopy.  Patient was given IVF as well as 5u PRBCsfor the admission.  NM tagged RBC scan was negative.  In am 01/01/18, pt had another 3 bloody BMs, and 2 more units of PRBCs ordered.  General surgery consulted to assist with management.  Assessment/Plan: Lower GI bleed/ Hematochezia/ Diverticular bleed/ Acute on chronic normocytic anemia/ Symptomatic anemia/ Anemia of acute blood loss -presented with hypotension, near syncopal event -Hemoglobin on admission 11.1.  -Gastroenterology consulted and appreciated- Suspect colonic diverticular bleed -s/p 12/28/17 colonoscopy: Multiple diverticula at the cecum and ascending colon transverse and sigmoid colon. None with stigmata of bleed. Small external hemorrhoids. -Overnight on 10/6-7, patient continued to  have several episodes of painless hematochezia as well as a syncopal event -overnight 10/8-9--pt had another syncopal episode with hematochezia -01/01/18 AM--3 more bloody BMs with near syncope/syncope -NM RBC scan negative -Patient has been transfused 5 units PRBC thus far-->2 more units ordered -Hemoglobin trending down again -Dr. Laural Golden updated 01/01/18 -general surgery consult  Syncope -likely secondary to GI bleed/ -vasovagal -currently no neurological deficits -12/31/17 CT brain--neg  -Continue to monitor  -01/01/18 telemetry reviewed--short episodes of NS Vtach <10 beats -Mag 2.2  Hypotension -Resolved withIV resuscitation and blood transfusions  Essential hypertension -presented with hypotension -hold lisinopril and monitor   COPD -Currently stable. No wheezing on exam -continue Spiriva and albuterol PRN  Weakness -secondary to anemia/GI bleed -PT consulted andrecommended HH, however patient has declined     Disposition Plan:   Home when hematochezia resolves Family Communication:   Spouse updated at bedside  Consultants:  GI--[name] and general surgery--[place]  Code Status:  FULL   DVT Prophylaxis:  SCDs   Procedures: As Listed in Progress Note Above  Antibiotics: None   Subjective: Pt feeling weak.  Denies cp, sob, dizziness, n/v, abd pain.  No dysuria or hematuria.  No f/c.  Objective: Vitals:   01/01/18 0626 01/01/18 0638 01/01/18 0647 01/01/18 0711  BP: 116/72 106/62 (!) 98/59 111/65  Pulse:    (!) 110  Resp:    18  Temp:    98 F (36.7 C)  TempSrc:    Oral  SpO2:    100%  Weight:      Height:        Intake/Output Summary (Last 24 hours) at 01/01/2018 0719 Last data filed at 01/01/2018 0545 Gross per 24  hour  Intake 825 ml  Output 400 ml  Net 425 ml   Weight change:  Exam:   General:  Pt is alert, follows commands appropriately, not in acute distress  HEENT: No icterus, No thrush, No neck mass,  [place]/AT  Cardiovascular: RRR, S1/S2, no rubs, no gallops  Respiratory: fine bibasilar crackles, no wheeze  Abdomen: Soft/+BS, non tender, non distended, no guarding  Extremities: No edema, No lymphangitis, No petechiae, No rashes, no synovitis   Data Reviewed: I have personally reviewed following labs and imaging studies Basic Metabolic Panel: Recent Labs  Lab 12/28/17 0346 12/28/17 2013 12/29/17 0724 12/30/17 0437 01/01/18 0120  NA 139 138 137 140 139  K 4.0 3.4* 4.5 4.8 4.1  CL 109 110 111 112* 110  CO2 26 23 23 25 24   GLUCOSE 110* 147* 116* 123* 108*  BUN 15 17 18 12 17   CREATININE 0.96 1.00 0.95 0.92 1.00  CALCIUM 8.4* 8.1* 7.6* 8.2* 7.9*  MG  --   --   --   --  2.2   Liver Function Tests: Recent Labs  Lab 12/27/17 1034 12/28/17 2013  AST 17 19  ALT 18 18  ALKPHOS 40 38  BILITOT 0.7 0.2*  PROT 6.1* 5.4*  ALBUMIN 3.1* 2.9*   No results for input(s): LIPASE, AMYLASE in the last 168 hours. No results for input(s): AMMONIA in the last 168 hours. Coagulation Profile: Recent Labs  Lab 12/27/17 1034  INR 1.14   CBC: Recent Labs  Lab 12/28/17 0346  12/28/17 2013 12/29/17 0528  12/30/17 0437  12/30/17 1657 12/31/17 0048 12/31/17 1322 01/01/18 0120 01/01/18 0608  WBC 7.8  --  12.0* 13.8*  --  12.1*  --   --   --   --   --  13.0*  NEUTROABS  --   --   --   --   --   --   --   --   --   --   --  8.3*  HGB 9.9*   < > 8.5* 8.9*   < > 8.7*   < > 8.4* 7.3* 8.5* 8.3* 7.6*  HCT 30.8*   < > 26.6* 26.5*   < > 26.0*   < > 26.3* 23.5* 26.1* 25.8* 24.2*  MCV 96.6  --  96.7 91.7  --  90.9  --   --   --   --   --  93.1  PLT 195  --  194 158  --  145*  --   --   --   --   --  171   < > = values in this interval not displayed.   Cardiac Enzymes: Recent Labs  Lab 12/27/17 1034  TROPONINI <0.03   BNP: Invalid input(s): POCBNP CBG: Recent Labs  Lab 12/28/17 2006 12/29/17 1956  GLUCAP 130* 100*   HbA1C: No results for input(s): HGBA1C in the last 72  hours. Urine analysis:    Component Value Date/Time   COLORURINE YELLOW 05/09/2017 1705   APPEARANCEUR HAZY (A) 05/09/2017 1705   LABSPEC 1.017 05/09/2017 1705   PHURINE 5.0 05/09/2017 1705   GLUCOSEU NEGATIVE 05/09/2017 1705   HGBUR MODERATE (A) 05/09/2017 1705   BILIRUBINUR NEGATIVE 05/09/2017 1705   KETONESUR NEGATIVE 05/09/2017 1705   PROTEINUR NEGATIVE 05/09/2017 1705   UROBILINOGEN 0.2 09/13/2010 0850   NITRITE NEGATIVE 05/09/2017 1705   LEUKOCYTESUR SMALL (A) 05/09/2017 1705   Sepsis Labs: @LABRCNTIP (procalcitonin:4,lacticidven:4) ) Recent Results (from the past 240 hour(s))  MRSA PCR Screening     Status: None   Collection Time: 12/28/17 11:36 PM  Result Value Ref Range Status   MRSA by PCR NEGATIVE NEGATIVE Final    Comment:        The GeneXpert MRSA Assay (FDA approved for NASAL specimens only), is one component of a comprehensive MRSA colonization surveillance program. It is not intended to diagnose MRSA infection nor to guide or monitor treatment for MRSA infections. Performed at [facility], [address], [place], [place] [address]      Scheduled Meds: . sodium chloride   Intravenous Once  . sodium chloride flush  3 mL Intravenous Q12H  . tiotropium  18 mcg Inhalation Daily   Continuous Infusions: . sodium chloride      Procedures/Studies: Ct Head Wo Contrast  Result Date: 12/31/2017 CLINICAL DATA:  Head trauma. Syncope. Impact to the back of the head. EXAM: CT HEAD WITHOUT CONTRAST TECHNIQUE: Contiguous axial images were obtained from the base of the skull through the vertex without intravenous contrast. COMPARISON:  Head CT 11/03/2007 FINDINGS: Brain: There is no mass, hemorrhage or extra-axial collection. The size and configuration of the ventricles and extra-axial CSF spaces are normal. There is no acute or chronic infarction. There is hypoattenuation of the periventricular white matter, most commonly indicating chronic ischemic microangiopathy.  Vascular: No abnormal hyperdensity of the major intracranial arteries or dural venous sinuses. No intracranial atherosclerosis. Skull: The visualized skull base, calvarium and extracranial soft tissues are normal. Sinuses/Orbits: No fluid levels or advanced mucosal thickening of the visualized paranasal sinuses. No mastoid or middle ear effusion. The orbits are normal. 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 Electronically Signed   By: Ulyses Jarred M.D.   On: 12/31/2017 01:45   Nm Gi Blood Loss  Result Date: 12/31/2017 CLINICAL DATA:  Recurrent GI bleeding EXAM: NUCLEAR MEDICINE GASTROINTESTINAL BLEEDING SCAN TECHNIQUE: Sequential abdominal images were obtained following intravenous administration of Tc-72m labeled red blood cells. RADIOPHARMACEUTICALS:  22.5 mCi Tc-28m pertechnetate in-vitro labeled red cells. COMPARISON:  CT abdomen and pelvis 02/05/2017 FINDINGS: Normal blood pool distribution of tracer. Minimal de labeled tracer within the bladder. No abnormal gastrointestinal localization of tracer identified to suggest active GI bleeding. IMPRESSION: Negative GI bleeding scan. Electronically Signed   By: Lavonia Dana M.D.   On: 12/31/2017 14:55    Orson Eva, DO  [facility] Pager [phone]  If 7PM-7AM, please contact night-coverage [email or web address] Password [email or web address] 01/01/2018, 7:19 AM   LOS: 3 days

## 2018-01-01 NOTE — Op Note (Signed)
Patient:  Randy Russo  DOB:  08/06/1940  MRN:  [id number]   Preop Diagnosis: Diverticular bleed  Postop Diagnosis: Same  Procedure: Right hemicolectomy  Surgeon: Aviva Signs, MD  Anes: General endotracheal  Indications: Patient is a 77 year old black male who underwent a colonoscopy due to recurrent episodes of GI bleeding requiring almost 7 units of packed red blood cells when it was found that he seemed to have a diverticular bleed at the hepatic flexure.  This could not be controlled endoscopically, thus the patient being brought over emergently for a partial colectomy.  Bowel prep could not be performed given the emergent nature of the procedure.  The risks and benefits of the procedure including bleeding, infection, recurrence of the bleeding, cardiopulmonary difficulties, and anastomotic leak were fully explained to the patient and wife, who gave informed consent.  Procedure note: The patient was placed in supine position.  After induction of general endotracheal anesthesia, the abdomen was prepped and draped using usual sterile technique with ChloraPrep.  Surgical site confirmation was performed.  Midline incision was made above the umbilicus to below the umbilicus.  Peritoneal cavity was entered into without difficulty.  The liver was palpated noted to be within normal limits.  The small bowel was inspected and noted to be within normal limits.  The right colon was mobilized along its peritoneal reflection.  Care was taken to avoid the right ureter and duodenum.  The hepatic flexure was taken down using the LigaSure.  A GIA-75 stapler was placed across the terminal ileum and fired.  This was likewise done across the midportion of the transverse colon, just proximal to the middle colic artery.  The mesentery was then divided using LigaSure.  The specimen was then removed from the operative field.  A side-to-side ileocolic anastomosis was performed using a GIA-75 stapler.  The enterotomy  was closed using a TA 60 stapler.  Staple line was bolstered using 3-0 silk sutures.  Surrounding omentum was placed over the anastomosis and secured in place using 3-0 interrupted silk suture.  The abdominal cavity was then copiously irrigated with normal saline.  The mesentery was inspected and no abnormal bleeding was noted.  The bowel was returned into the abdominal cavity in an orderly fashion.  All operating personnel then changed her gown and gloves.  A new set-up was used for closure.  The fascia was reapproximated using a looped 0 Novafil running suture.  Subcutaneous layer was irrigated with normal saline.  Exparel was instilled into the surrounding wound.  The skin was closed using staples.  Betadine ointment and dry sterile dressings were applied.  All tape and needle counts were correct at the end of the procedure.  The patient was extubated in the operating room and transferred to PACU in guarded but stable condition.  Complications: None  EBL: 50 cc  Specimen: Right colon  Blood: 2 units of FFP given

## 2018-01-01 NOTE — Progress Notes (Signed)
Brief EGD and colonoscopy note.  Will EGD except single erosion in antrum.  Pancolonic diverticulosis. [place] blood noted throughout the colon along with pool of fresh blood at ascending colon/hepatic flexure. Bleeding diverticulum could not be seen because of bleeding. Dilute epi injected in that area but without success.

## 2018-01-01 NOTE — Progress Notes (Signed)
Patient underwent colonoscopy by Dr. Laural Golden.  Was found to have active bleeding at the hepatic flexure.  This could not be controlled endoscopically.  Thus, the patient is being brought to the operating room emergently for a partial colectomy.  The risks and benefits of the procedure including bleeding, infection, cardiopulmonary difficulties, and the possibility of recurrent bleeding were fully explained to the patient and his wife, who gave informed consent.

## 2018-01-01 NOTE — Anesthesia Procedure Notes (Signed)
Procedure Name: Intubation Date/Time: 01/01/2018 5:48 PM Performed by: Lenice Llamas, MD Pre-anesthesia Checklist: Patient identified, Patient being monitored, Timeout performed, Emergency Drugs available and Suction available Patient Re-evaluated:Patient Re-evaluated prior to induction Oxygen Delivery Method: Circle System Utilized Preoxygenation: Pre-oxygenation with 100% oxygen Induction Type: IV induction, Rapid sequence and Cricoid Pressure applied Laryngoscope Size: Miller, 2, Mac and 4 Grade View: Grade I Tube type: Oral Tube size: 8.0 mm Number of attempts: 1 Airway Equipment and Method: Stylet Placement Confirmation: ETT inserted through vocal cords under direct vision,  positive ETCO2 and breath sounds checked- equal and bilateral Secured at: 22 cm Tube secured with: Tape Dental Injury: Teeth and Oropharynx as per pre-operative assessment

## 2018-01-01 NOTE — Op Note (Signed)
[facility] Patient Name: Benton Tooker Procedure Date: 01/01/2018 3:41 PM MRN: [id number] Date of Birth: [birth year] Attending MD: Hildred Laser , MD CSN: [id number] Age: 77 Admit Type: Outpatient Procedure:                Colonoscopy Indications:              Rectal bleeding Providers:                Hildred Laser, MD, Janeece Riggers, RN, Aram Candela Referring MD:             Ottis Stain, MD Medicines:                Midazolam 2 mg IV Complications:            No immediate complications. Estimated Blood Loss:     Estimated blood loss: none. Procedure:                Pre-Anesthesia Assessment:                           - Prior to the procedure, a History and Physical                            was performed, and patient medications and                            allergies were reviewed. The patient's tolerance of                            previous anesthesia was also reviewed. The risks                            and benefits of the procedure and the sedation                            options and risks were discussed with the patient.                            All questions were answered, and informed consent                            was obtained. Prior Anticoagulants: The patient                            last took aspirin 6 days prior to the procedure.                            ASA Grade Assessment: III - A patient with severe                            systemic disease. After reviewing the risks and                            benefits, the patient was deemed in satisfactory  condition to undergo the procedure.                           After obtaining informed consent, the colonoscope                            was passed under direct vision. Throughout the                            procedure, the patient's blood pressure, pulse, and                            oxygen saturations were monitored continuously. The   PCF-H190DL ([id number]) scope was introduced through                            the anus and advanced to the the cecum, identified                            by appendiceal orifice and ileocecal valve. The                            colonoscopy was technically difficult and complex                            due to excessive bleeding. Successful completion of                            the procedure was aided by lavage. The patient                            tolerated the procedure well. The quality of the                            bowel preparation was adequate. The ileocecal                            valve, appendiceal orifice, and rectum were                            photographed. Scope In: 3:43:28 PM Scope Out: 4:18:47 PM Scope Withdrawal Time: 0 hours 20 minutes 43 seconds  Total Procedure Duration: 0 hours 35 minutes 19 seconds  Findings:      The perianal and digital rectal examinations were normal.      Red blood was found in the entire colon.      Multiple small and large-mouthed diverticula were found in the entire       colon.      Red blood was found at the hepatic flexure. Impression:               - Blood in the entire examined colon.                           - Diverticulosis in the entire examined colon.                           -  Active bleeding from diverticulum at hepatic                            flexure. Bleeding site not located.                           - Dilute epinephrine injected into the site without                            hemostasis.                           - No specimens collected. Moderate Sedation:      Moderate (conscious) sedation was administered by the endoscopy nurse       and supervised by the endoscopist. The following parameters were       monitored: oxygen saturation, heart rate, blood pressure, CO2       capnography and response to care. Total physician intraservice time was       36 minutes. Recommendation:           - Return  patient to hospital ward prior to surgery.                            Dr. Arnoldo Morale contacted.                           - Findings reviewed with Mrs. Mee Hives.                           - NPO.                           - Continue present medications.                           - Two units of FFP. Procedure Code(s):        --- Professional ---                           [phone], Colonoscopy, flexible; diagnostic, including                            collection of specimen(s) by brushing or washing,                            when performed (separate procedure)                           99153, Moderate sedation; each additional 15                            minutes intraservice time                           [id number], Moderate sedation services provided by the  same physician or other qualified health care                            professional performing a gastrointestinal                            endoscopic service that sedation supports,                            requiring the presence of an independent trained                            observer to assist in the monitoring of the                            patient's level of consciousness and physiological                            status; initial 15 minutes of intra-service time;                            patient age 84 years or older (additional time may                            be reported with [phone], as appropriate) Diagnosis Code(s):        --- Professional ---                           K92.2, Gastrointestinal hemorrhage, unspecified                           K62.5, Hemorrhage of anus and rectum                           K57.30, Diverticulosis of large intestine without                            perforation or abscess without bleeding CPT copyright 2018 American Medical Association. All rights reserved. The codes documented in this report are preliminary and upon coder review may  be revised to meet  current compliance requirements. Hildred Laser, MD Hildred Laser, MD 01/01/2018 5:06:56 PM This report has been signed electronically. Number of Addenda: 0

## 2018-01-01 NOTE — Progress Notes (Addendum)
Patient ID: Randy Russo, male   DOB: 1940/06/23, 77 y.o.   MRN: [id number] Daughter present in room. Patient seems very restless. Had 3 blood stools during the night. Marooned colored. C/o left hip pain. Hemoglobin down to 7.6. Two units of PRBCs has been ordered. 1st unit is hanging. Blood pressure (!) 96/53, pulse (!) 105, temperature (!) 97.5 F (36.4 C), temperature source Axillary, resp. rate 18, height 6' (1.829 m), weight 114.2 kg, SpO2 100 %. Lower GI bleed. Will keep patient NPO. Colonoscopy today.   GI attending note:  Patient is passing blood per rectum. He is receiving 6 unit of PRBCs. Has noted mild pain in right mid and right lower quadrant of his abdomen. His abdominal examination is benign.  In addition discussed with his sister who is at bedside. Proceed with EGD and colonoscopy later today. I have asked Dr. Carles Collet to obtain surgical consultation.

## 2018-01-01 NOTE — Anesthesia Preprocedure Evaluation (Signed)
Anesthesia Evaluation  Patient identified by MRN, date of birth, ID band Patient awake    Reviewed: Allergy & Precautions, NPO status , Patient's Chart, lab work & pertinent test results  Airway Mallampati: III  TM Distance: >3 FB Neck ROM: Full    Dental no notable dental hx. (+) Edentulous Upper, Edentulous Lower   Pulmonary COPD, Current Smoker,  States ex smoker   Pulmonary exam normal breath sounds clear to auscultation       Cardiovascular Exercise Tolerance: Good hypertension, Pt. on medications negative cardio ROS Normal cardiovascular examI Rhythm:Regular Rate:Normal     Neuro/Psych negative neurological ROS  negative psych ROS   GI/Hepatic negative GI ROS, Neg liver ROS, GI bleed - s/p 9 units PRBCs , 2 were today , actively bleeding -to OR   Endo/Other  negative endocrine ROS  Renal/GU negative Renal ROS  negative genitourinary   Musculoskeletal  (+) Arthritis ,   Abdominal   Peds negative pediatric ROS (+)  Hematology negative hematology ROS (+) anemia ,   Anesthesia Other Findings   Reproductive/Obstetrics negative OB ROS                             Anesthesia Physical Anesthesia Plan  ASA: III and emergent  Anesthesia Plan: General   Post-op Pain Management:    Induction: Intravenous  PONV Risk Score and Plan:   Airway Management Planned: Oral ETT  Additional Equipment:   Intra-op Plan:   Post-operative Plan: Extubation in OR  Informed Consent: I have reviewed the patients History and Physical, chart, labs and discussed the procedure including the risks, benefits and alternatives for the proposed anesthesia with the patient or authorized representative who has indicated his/her understanding and acceptance.   Dental advisory given  Plan Discussed with:   Anesthesia Plan Comments:         Anesthesia Quick Evaluation

## 2018-01-01 NOTE — Op Note (Signed)
[facility] Patient Name: Randy Russo Procedure Date: 01/01/2018 3:21 PM MRN: [id number] Date of Birth: [birth year] Attending MD: Hildred Laser , MD CSN: [id number] Age: 77 Admit Type: Inpatient Procedure:                Upper GI endoscopy Indications:              Active gastrointestinal bleeding Providers:                Hildred Laser, MD, Janeece Riggers, RN, Nelma Rothman,                            Technician, Aram Candela Referring MD:             Orson Eva, DO Medicines:                Lidocaine spray, Meperidine 25 mg IV, Midazolam 3                            mg IV Complications:            No immediate complications. Estimated Blood Loss:     Estimated blood loss: none. Procedure:                Pre-Anesthesia Assessment:                           - Prior to the procedure, a History and Physical                            was performed, and patient medications and                            allergies were reviewed. The patient's tolerance of                            previous anesthesia was also reviewed. The risks                            and benefits of the procedure and the sedation                            options and risks were discussed with the patient.                            All questions were answered, and informed consent                            was obtained. Prior Anticoagulants: The patient                            last took aspirin 6 days prior to the procedure.                            ASA Grade Assessment: III - A patient with severe  systemic disease. After reviewing the risks and                            benefits, the patient was deemed in satisfactory                            condition to undergo the procedure.                           After obtaining informed consent, the endoscope was                            passed under direct vision. Throughout the                            procedure, the patient's blood  pressure, pulse, and                            oxygen saturations were monitored continuously. The                            GIF-H190 ([id number]) scope was introduced through the                            mouth, and advanced to the second part of duodenum.                            The upper GI endoscopy was accomplished without                            difficulty. The patient tolerated the procedure                            well. Scope In: 3:35:47 PM Scope Out: 3:40:17 PM Total Procedure Duration: 0 hours 4 minutes 30 seconds  Findings:      The examined esophagus was normal.      The Z-line was irregular and was found 40 cm from the incisors.      A single, non-bleeding erosion was found in the gastric antrum. There       were no stigmata of recent bleeding.      The exam of the stomach was otherwise normal.      The duodenal bulb and second portion of the duodenum were normal. Impression:               - Normal esophagus.                           - Z-line irregular, 40 cm from the incisors.                           - Non-bleeding erosive gastropathy.                           - Normal duodenal bulb and second portion of the  duodenum.                           - No specimens collected. Moderate Sedation:      Moderate (conscious) sedation was administered by the endoscopy nurse       and supervised by the endoscopist. The following parameters were       monitored: oxygen saturation, heart rate, blood pressure, CO2       capnography and response to care. Total physician intraservice time was       8 minutes. Recommendation:           - See the other procedure note for documentation of                            additional recommendations. Procedure Code(s):        --- Professional ---                           [phone], Esophagogastroduodenoscopy, flexible,                            transoral; diagnostic, including collection of                             specimen(s) by brushing or washing, when performed                            (separate procedure) Diagnosis Code(s):        --- Professional ---                           K22.8, Other specified diseases of esophagus                           K31.89, Other diseases of stomach and duodenum                           K92.2, Gastrointestinal hemorrhage, unspecified CPT copyright 2018 American Medical Association. All rights reserved. The codes documented in this report are preliminary and upon coder review may  be revised to meet current compliance requirements. Hildred Laser, MD Hildred Laser, MD 01/01/2018 4:35:32 PM This report has been signed electronically. Number of Addenda: 0

## 2018-01-01 NOTE — Progress Notes (Signed)
Called to rapid response for patient having what appeared to be a seizure as reported by staff of 300. On arrival patient is awake but has passed a large volume of red blood. He is conscious awake and oriented x 4. Dr Olevia Bowens and Dr Tat are available in the room.He continues to pass blood. Please see Dr.Tat/Dr. Olevia Bowens orders for further, 2 units of PRBC have been ordered and are prepared at this time. This patient is known to Dr. Laural Golden. The first unit of blood is started at this time.

## 2018-01-02 DIAGNOSIS — K5731 Diverticulosis of large intestine without perforation or abscess with bleeding: Principal | ICD-10-CM

## 2018-01-02 DIAGNOSIS — D62 Acute posthemorrhagic anemia: Secondary | ICD-10-CM

## 2018-01-02 LAB — PROTIME-INR
INR: 1.19
Prothrombin Time: 15 seconds (ref 11.4–15.2)

## 2018-01-02 LAB — CBC
HEMATOCRIT: 27.5 % — AB (ref 39.0–52.0)
Hemoglobin: 9 g/dL — ABNORMAL LOW (ref 13.0–17.0)
MCH: 30.2 pg (ref 26.0–34.0)
MCHC: 32.7 g/dL (ref 30.0–36.0)
MCV: 92.3 fL (ref 80.0–100.0)
NRBC: 2.2 % — AB (ref 0.0–0.2)
Platelets: 96 10*3/uL — ABNORMAL LOW (ref 150–400)
RBC: 2.98 MIL/uL — AB (ref 4.22–5.81)
RDW: 15.2 % (ref 11.5–15.5)
WBC: 19 10*3/uL — ABNORMAL HIGH (ref 4.0–10.5)

## 2018-01-02 LAB — MAGNESIUM: Magnesium: 1.9 mg/dL (ref 1.7–2.4)

## 2018-01-02 LAB — BASIC METABOLIC PANEL
ANION GAP: 5 (ref 5–15)
BUN: 12 mg/dL (ref 8–23)
CO2: 20 mmol/L — AB (ref 22–32)
Calcium: 6.8 mg/dL — ABNORMAL LOW (ref 8.9–10.3)
Chloride: 114 mmol/L — ABNORMAL HIGH (ref 98–111)
Creatinine, Ser: 0.8 mg/dL (ref 0.61–1.24)
GFR calc Af Amer: >60 mL/min (ref >60)
GFR calc non Af Amer: >60 mL/min (ref >60)
GLUCOSE: 129 mg/dL — AB (ref 70–99)
POTASSIUM: 3.8 mmol/L (ref 3.5–5.1)
Sodium: 139 mmol/L (ref 135–145)

## 2018-01-02 LAB — BPAM FFP
Blood Product Expiration Date: 201910152359
Blood Product Expiration Date: 201910152359
ISSUE DATE / TIME: 201910101906
ISSUE DATE / TIME: 201910101908
Unit Type and Rh: 7300
Unit Type and Rh: 7300

## 2018-01-02 LAB — PREPARE FRESH FROZEN PLASMA
Unit division: 0
Unit division: 0

## 2018-01-02 LAB — PHOSPHORUS: PHOSPHORUS: 2.9 mg/dL (ref 2.5–4.6)

## 2018-01-02 MED ORDER — MELATONIN 3 MG PO TABS
3.0000 mg | ORAL_TABLET | Freq: Once | ORAL | Status: AC
  Administered 2018-01-02: 3 mg via ORAL
  Filled 2018-01-02: qty 1

## 2018-01-02 MED ORDER — NON FORMULARY: 3.0000 mg | Freq: Once | Status: DC

## 2018-01-02 NOTE — Anesthesia Postprocedure Evaluation (Signed)
Anesthesia Post Note  Patient: Randy Russo  Procedure(s) Performed: PARTIAL COLECTOMY (N/A Abdomen)  Patient location during evaluation: ICU Anesthesia Type: General Level of consciousness: awake and awake and alert Pain management: pain level controlled Vital Signs Assessment: post-procedure vital signs reviewed and stable Respiratory status: spontaneous breathing Cardiovascular status: blood pressure returned to baseline Postop Assessment: no headache Anesthetic complications: no     Last Vitals:  Vitals:   01/02/18 0550 01/02/18 0600  BP: 126/66 129/61  Pulse: 100 (!) 101  Resp: (!) 23 (!) 22  Temp: 36.8 C   SpO2: 99% 96%    Last Pain:  Vitals:   01/02/18 0550  TempSrc: Axillary  PainSc:                  Lenice Llamas

## 2018-01-02 NOTE — Progress Notes (Signed)
1 Day Post-Op  Subjective: Patient denies any significant incisional pain.  Objective: Vital signs in last 24 hours: Temp:  [97.5 F (36.4 C)-99 F (37.2 C)] 98.3 F (36.8 C) (10/11 0818) Pulse Rate:  [93-114] 103 (10/11 0818) Resp:  [14-34] 25 (10/11 0818) BP: (78-141)/(49-79) 129/61 (10/11 0600) SpO2:  [96 %-100 %] 96 % (10/11 0600) Weight:  [114.5 kg] 114.5 kg (10/11 0541) Last BM Date: 01/01/18  Intake/Output from previous day: 10/10 0701 - 10/11 0700 In: 5133.4 [I.V.:3299; Blood:1834.4] Out: 1250 [Urine:1200; Blood:50] Intake/Output this shift: No intake/output data recorded.  General appearance: alert, cooperative and no distress Resp: clear to auscultation bilaterally Cardio: regular rate and rhythm, S1, S2 normal, no murmur, click, rub or gallop GI: Soft, incision healing well.  Minimal bowel sounds appreciated.  Lab Results:  Recent Labs    01/01/18 0608  01/01/18 2030 01/02/18 0830  WBC 13.0*  --   --  19.0*  HGB 7.6*   < > 6.2* 9.0*  HCT 24.2*   < > 19.6* 27.5*  PLT 171  --   --  96*   < > = values in this interval not displayed.   BMET Recent Labs    01/01/18 0120 01/02/18 0830  NA 139 139  K 4.1 3.8  CL 110 114*  CO2 24 20*  GLUCOSE 108* 129*  BUN 17 12  CREATININE 1.00 0.80  CALCIUM 7.9* 6.8*   PT/INR Recent Labs    01/02/18 0830  LABPROT 15.0  INR 1.19    Studies/Results: Nm Gi Blood Loss  Result Date: 12/31/2017 CLINICAL DATA:  Recurrent GI bleeding EXAM: NUCLEAR MEDICINE GASTROINTESTINAL BLEEDING SCAN TECHNIQUE: Sequential abdominal images were obtained following intravenous administration of Tc-41m labeled red blood cells. RADIOPHARMACEUTICALS:  22.5 mCi Tc-47m pertechnetate in-vitro labeled red cells. COMPARISON:  CT abdomen and pelvis 02/05/2017 FINDINGS: Normal blood pool distribution of tracer. Minimal de labeled tracer within the bladder. No abnormal gastrointestinal localization of tracer identified to suggest active GI  bleeding. IMPRESSION: Negative GI bleeding scan. Electronically Signed   By: Lavonia Dana M.D.   On: 12/31/2017 14:55    Anti-infectives: Anti-infectives (From admission, onward)   Start     Dose/Rate Route Frequency Ordered Stop   01/01/18 1715  ertapenem (INVANZ) 1,000 mg in sodium chloride 0.9 % 100 mL IVPB     1 g 200 mL/hr over 30 Minutes Intravenous  Once 01/01/18 1714 01/01/18 1751      Assessment/Plan: s/p Procedure(s): PARTIAL COLECTOMY Impression: Stable on postoperative day 1.  Has responded appropriately with 3 units of packed red blood cells transfused. Plan: Continue to monitor in ICU.  May have clear liquid diet.  LOS: 4 days    Aviva Signs 01/02/2018

## 2018-01-02 NOTE — Progress Notes (Signed)
  Subjective:  Patient complains of intermittent nausea but denies vomiting or abdominal pain.  He has not passed any more blood since he had large bloody bowel movement early this morning.  Denies chest pain or shortness of breath.  Objective: Blood pressure 130/67, pulse 97, temperature 99.5 F (37.5 C), temperature source Oral, resp. rate (!) 22, height 6' (1.829 m), weight 114.5 kg, SpO2 94 %. Patient is alert and in no acute distress. He has upper midline incision. Bowel sounds are present but hypoactive.  On palpation his abdomen is soft and nontender.  Labs/studies Results:  Recent Labs    18-Jan-2018 0608 01-18-18 1338 01-18-2018 2030 01/02/18 0830  WBC 13.0*  --   --  19.0*  HGB 7.6* 9.5* 6.2* 9.0*  HCT 24.2* 29.1* 19.6* 27.5*  PLT 171  --   --  96*    BMET  Recent Labs    18-Jan-2018 0120 01/02/18 0830  NA 139 139  K 4.1 3.8  CL 110 114*  CO2 24 20*  GLUCOSE 108* 129*  BUN 17 12  CREATININE 1.00 0.80  CALCIUM 7.9* 6.8*    LFT  No results for input(s): PROT, ALBUMIN, AST, ALT, ALKPHOS, BILITOT, BILIDIR, IBILI in the last 72 hours.  PT/INR  Recent Labs    01/02/18 0830  LABPROT 15.0  INR 1.19     Assessment:    Life-threatening lower GI bleed secondary to diverticulum at hepatic flexure well documented on second colonoscopy yesterday but could not be controlled endoscopically.  He underwent right hemicolectomy.  He is doing well postop. He has received 10 units of PRBCs.  Was also given 2 units of FFP yesterday. His renal function is well preserved.  Will ask Dr. Gala Romney to see patient over the weekend during my absence.

## 2018-01-02 NOTE — Progress Notes (Signed)
PROGRESS NOTE  Randy Russo KGU:[id number] DOB: Jul 31, 1940 DOA: 12/27/2017 PCP: Sharilyn Sites, MD  Brief History: Randy Diss Wheeleris a 77 y.o.malewith a medical history ofhypertension, COPD, who presented to the emergency department with complaints of almost passing out as well as a GI bleed. It seems that patient had gotten up this morning to use the bathroom and had a bowel movement. He was unable to get up from the commode, had fallen backwards at which point his eyes rolled back into his head and he had some shaking. Was reported by patient's wife. Patient does not recall these events. Patient's wife does not note any bloody bowel movement. Per EMS patient had a large bloody bowel movement. Patient felt weak and dizzy. Denies current chest pain, shortness of breath, abdominal pain, nausea or vomiting, constipation, problems with urination, numbness or tingling, recent illness or travel.  Interim history Admitted for near syncope secondary to GI Bleed. GI consulted, s/p colonoscopy.During the hospitalization, pt had 2 episodes of syncope which were likely vagally mediated. He continued to have hematochezia after the colonoscopy. Patient was given IVF as well as 5u PRBCsfor the admission. NM tagged RBC scan was negative.  In am 01/01/18, pt had another 3 bloody BMs, and 2 more units of PRBCs ordered.  General surgery consulted to assist with management.  Assessment/Plan: Lower GI bleed/ Hematochezia/ Diverticular bleed/ Acute on chronic normocytic anemia/ Symptomatic anemia/ Anemia of acute blood loss -presented with hypotension, near syncopal event -Hemoglobin on admission 11.1.  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 None with stigmata of bleed. Small external hemorrhoids. -Overnight on 10/6-7, patient continued to  have several episodes of painless hematochezia as well as a syncopal event -overnight 10/8-9--pt had another syncopal episodewith hematochezia -01/01/18 AM--3 more bloody BMs with near syncope/syncope -NM RBC scan negative -Patient has been transfused10units PRBC during hospitalization -01/01/18--colonoscopy--blood in entire colon; active bleed from diverticula @ R-hepatic flexure with bleeding site not located -general surgery consult appreciated-->right hemicolectomy 10/11  Syncope -likely secondary to GI bleed/ -vasovagal -currently no neurological deficits -12/31/17 CT brain--neg -Continue to monitor  -01/01/18 telemetry reviewed--short episodes of NS Vtach <10 beats -Mag 2.2  Hypotension -Resolved withIV resuscitation and blood transfusions  Essential hypertension -presented with hypotension -hold lisinopril and monitor   COPD -Currently stable. No wheezing on exam -continue Spiriva and albuterol PRN  Weakness -secondary to anemia/GI bleed -PT consulted andrecommended HH, however patient has declined     Disposition Plan: Home when stable from surgery Family Communication:Spouse updatedat bedside 10/11  Consultants:GI--[name] and general surgery--Jenkins  Code Status: FULL   DVT Prophylaxis: SCDs   Procedures: As Listed in Progress Note Above  Antibiotics: None     Subjective: Pt complains of surgical site pain.  No cp, sob, n/v/d.  No dysuria.  No f/c  Objective: Vitals:   01/02/18 0550 01/02/18 0600 01/02/18 0818 01/02/18 1114  BP: 126/66 129/61    Pulse: 100 (!) 101 (!) 103 93  Resp: (!) 23 (!) 22 (!) 25 (!) 21  Temp: 98.2 F (36.8 C)  98.3 F (36.8 C) 98.1 F (36.7 C)  TempSrc: Axillary  Oral Oral  SpO2: 99% 96%    Weight:      Height:        Intake/Output Summary (Last 24 hours) at 01/02/2018 1456 Last data filed at 01/02/2018 0550 Gross per 24 hour  Intake 4437.44 ml  Output 1150 ml  Net 3287.44 ml     Weight change:  Exam:   General:  Pt is alert, follows commands appropriately, not in acute distress  HEENT: No icterus, No thrush, No neck mass, [place]/AT  Cardiovascular: RRR, S1/S2, no rubs, no gallops  Respiratory:bibasilar crackles, no wheeze  Abdomen: Soft/+BS, mild diffuse tender, non distended, no guarding  Extremities: No edema, No lymphangitis, No petechiae, No rashes, no synovitis   Data Reviewed: I have personally reviewed following labs and imaging studies Basic Metabolic Panel: Recent Labs  Lab 12/28/17 2013 12/29/17 0724 12/30/17 0437 01/01/18 0120 01/02/18 0830  NA 138 137 140 139 139  K 3.4* 4.5 4.8 4.1 3.8  CL 110 111 112* 110 114*  CO2 23 23 25 24  20*  GLUCOSE 147* 116* 123* 108* 129*  BUN 17 18 12 17 12   CREATININE 1.00 0.95 0.92 1.00 0.80  CALCIUM 8.1* 7.6* 8.2* 7.9* 6.8*  MG  --   --   --  2.2 1.9  PHOS  --   --   --   --  2.9   Liver Function Tests: Recent Labs  Lab 12/27/17 1034 12/28/17 2013  AST 17 19  ALT 18 18  ALKPHOS 40 38  BILITOT 0.7 0.2*  PROT 6.1* 5.4*  ALBUMIN 3.1* 2.9*   No results for input(s): LIPASE, AMYLASE in the last 168 hours. No results for input(s): AMMONIA in the last 168 hours. Coagulation Profile: Recent Labs  Lab 12/27/17 1034 01/02/18 0830  INR 1.14 1.19   CBC: Recent Labs  Lab 12/28/17 2013 12/29/17 0528  12/30/17 0437  01/01/18 0120 01/01/18 0608 01/01/18 1338 01/01/18 2030 01/02/18 0830  WBC 12.0* 13.8*  --  12.1*  --   --  13.0*  --   --  19.0*  NEUTROABS  --   --   --   --   --   --  8.3*  --   --   --   HGB 8.5* 8.9*   < > 8.7*   < > 8.3* 7.6* 9.5* 6.2* 9.0*  HCT 26.6* 26.5*   < > 26.0*   < > 25.8* 24.2* 29.1* 19.6* 27.5*  MCV 96.7 91.7  --  90.9  --   --  93.1  --   --  92.3  PLT 194 158  --  145*  --   --  171  --   --  96*   < > = values in this interval not displayed.   Cardiac Enzymes: Recent Labs  Lab 12/27/17 1034  TROPONINI <0.03   BNP: Invalid input(s):  POCBNP CBG: Recent Labs  Lab 12/28/17 2006 12/29/17 1956  GLUCAP 130* 100*   HbA1C: No results for input(s): HGBA1C in the last 72 hours. Urine analysis:    Component Value Date/Time   COLORURINE YELLOW 05/09/2017 1705   APPEARANCEUR HAZY (A) 05/09/2017 1705   LABSPEC 1.017 05/09/2017 1705   PHURINE 5.0 05/09/2017 1705   GLUCOSEU NEGATIVE 05/09/2017 1705   HGBUR MODERATE (A) 05/09/2017 1705   BILIRUBINUR NEGATIVE 05/09/2017 1705   KETONESUR NEGATIVE 05/09/2017 1705   PROTEINUR NEGATIVE 05/09/2017 1705   UROBILINOGEN 0.2 09/13/2010 0850   NITRITE NEGATIVE 05/09/2017 1705   LEUKOCYTESUR SMALL (A) 05/09/2017 1705   Sepsis Labs: @LABRCNTIP (procalcitonin:4,lacticidven:4) ) Recent Results (from the past 240 hour(s))  MRSA PCR Screening     Status: None   Collection Time: 12/28/17 11:36 PM  Result Value Ref Range Status  MRSA by PCR NEGATIVE NEGATIVE Final    Comment:        The GeneXpert MRSA Assay (FDA approved for NASAL specimens only), is one component of a comprehensive MRSA colonization surveillance program. It is not intended to diagnose MRSA infection nor to guide or monitor treatment for MRSA infections. Performed at [facility], [address] [place], [place] [address]      Scheduled Meds: . pantoprazole  40 mg Oral Q1200   Continuous Infusions: . sodium chloride 100 mL/hr at 01/02/18 1117    Procedures/Studies: Ct Head Wo Contrast  Result Date: 12/31/2017 CLINICAL DATA:  Head trauma. Syncope. Impact to the back of the head. EXAM: CT HEAD WITHOUT CONTRAST TECHNIQUE: Contiguous axial images were obtained from the base of the skull through the vertex without intravenous contrast. COMPARISON:  Head CT 11/03/2007 FINDINGS: Brain: There is no mass, hemorrhage or extra-axial collection. The size and configuration of the ventricles and extra-axial CSF spaces are normal. There is no acute or chronic infarction. There is hypoattenuation of the periventricular  white matter, most commonly indicating chronic ischemic microangiopathy. Vascular: No abnormal hyperdensity of the major intracranial arteries or dural venous sinuses. No intracranial atherosclerosis. Skull: The visualized skull base, calvarium and extracranial soft tissues are normal. Sinuses/Orbits: No fluid levels or advanced mucosal thickening of the visualized paranasal sinuses. No mastoid or middle ear effusion. The orbits are normal. 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 Electronically Signed   By: Ulyses Jarred M.D.   On: 12/31/2017 01:45   Nm Gi Blood Loss  Result Date: 12/31/2017 CLINICAL DATA:  Recurrent GI bleeding EXAM: NUCLEAR MEDICINE GASTROINTESTINAL BLEEDING SCAN TECHNIQUE: Sequential abdominal images were obtained following intravenous administration of Tc-41m labeled red blood cells. RADIOPHARMACEUTICALS:  22.5 mCi Tc-86m pertechnetate in-vitro labeled red cells. COMPARISON:  CT abdomen and pelvis 02/05/2017 FINDINGS: Normal blood pool distribution of tracer. Minimal de labeled tracer within the bladder. No abnormal gastrointestinal localization of tracer identified to suggest active GI bleeding. IMPRESSION: Negative GI bleeding scan. Electronically Signed   By: Lavonia Dana M.D.   On: 12/31/2017 14:55    Orson Eva, DO  [facility] Pager [phone]  If 7PM-7AM, please contact night-coverage [email or web address] Password [email or web address] 01/02/2018, 2:56 PM   LOS: 4 days

## 2018-01-02 NOTE — Care Management Important Message (Signed)
Important Message  Patient Details  Name: Randy Russo MRN: [id number] Date of Birth: 1940-10-30   Medicare Important Message Given:  Yes    Shelda Altes 01/02/2018, 10:43 AM

## 2018-01-03 LAB — BASIC METABOLIC PANEL
ANION GAP: 6 (ref 5–15)
BUN: 7 mg/dL — ABNORMAL LOW (ref 8–23)
CO2: 22 mmol/L (ref 22–32)
Calcium: 7.6 mg/dL — ABNORMAL LOW (ref 8.9–10.3)
Chloride: 111 mmol/L (ref 98–111)
Creatinine, Ser: 0.8 mg/dL (ref 0.61–1.24)
GFR calc Af Amer: >60 mL/min (ref >60)
GFR calc non Af Amer: >60 mL/min (ref >60)
GLUCOSE: 120 mg/dL — AB (ref 70–99)
POTASSIUM: 3.6 mmol/L (ref 3.5–5.1)
Sodium: 139 mmol/L (ref 135–145)

## 2018-01-03 LAB — CBC
HEMATOCRIT: 25.6 % — AB (ref 39.0–52.0)
Hemoglobin: 8.3 g/dL — ABNORMAL LOW (ref 13.0–17.0)
MCH: 30.7 pg (ref 26.0–34.0)
MCHC: 32.4 g/dL (ref 30.0–36.0)
MCV: 94.8 fL (ref 80.0–100.0)
NRBC: 1.4 % — AB (ref 0.0–0.2)
Platelets: 119 10*3/uL — ABNORMAL LOW (ref 150–400)
RBC: 2.7 MIL/uL — AB (ref 4.22–5.81)
RDW: 17.2 % — AB (ref 11.5–15.5)
WBC: 14.7 10*3/uL — ABNORMAL HIGH (ref 4.0–10.5)

## 2018-01-03 LAB — PHOSPHORUS: Phosphorus: 1.8 mg/dL — ABNORMAL LOW (ref 2.5–4.6)

## 2018-01-03 LAB — MAGNESIUM: Magnesium: 2 mg/dL (ref 1.7–2.4)

## 2018-01-03 MED ORDER — POTASSIUM PHOSPHATES 15 MMOLE/5ML IV SOLN
40.0000 meq | Freq: Once | INTRAVENOUS | Status: AC
  Administered 2018-01-03: 40 meq via INTRAVENOUS
  Filled 2018-01-03: qty 9.09

## 2018-01-03 NOTE — Progress Notes (Signed)
2 Days Post-Op  Subjective: Patient has no complaints.  Bloody bowel movements have decreased.  Tolerating clear liquid diet.  Objective: Vital signs in last 24 hours: Temp:  [98.1 F (36.7 C)-99.9 F (37.7 C)] 99.5 F (37.5 C) (10/12 0850) Pulse Rate:  [93-117] 112 (10/12 0900) Resp:  [16-28] 17 (10/12 0900) BP: (127-154)/(67-78) 150/74 (10/12 0800) SpO2:  [93 %-94 %] 93 % (10/11 2035) Weight:  [112.5 kg] 112.5 kg (10/12 0400) Last BM Date: 01/01/18  Intake/Output from previous day: 10/11 0701 - 10/12 0700 In: 2088.3 [P.O.:120; I.V.:1968.3] Out: 2350 [Urine:2350] Intake/Output this shift: Total I/O In: 1036.3 [I.V.:1036.3] Out: 900 [Urine:900]  General appearance: alert, cooperative and no distress Resp: clear to auscultation bilaterally Cardio: regular rate and rhythm, S1, S2 normal, no murmur, click, rub or gallop GI: Soft, incision healing well.  Bowel sounds present.  Lab Results:  Recent Labs    01/02/18 0830 01/03/18 0438  WBC 19.0* 14.7*  HGB 9.0* 8.3*  HCT 27.5* 25.6*  PLT 96* 119*   BMET Recent Labs    01/02/18 0830 01/03/18 0438  NA 139 139  K 3.8 3.6  CL 114* 111  CO2 20* 22  GLUCOSE 129* 120*  BUN 12 7*  CREATININE 0.80 0.80  CALCIUM 6.8* 7.6*   PT/INR Recent Labs    01/02/18 0830  LABPROT 15.0  INR 1.19    Studies/Results: No results found.  Anti-infectives: Anti-infectives (From admission, onward)   Start     Dose/Rate Route Frequency Ordered Stop   01/01/18 1715  ertapenem (INVANZ) 1,000 mg in sodium chloride 0.9 % 100 mL IVPB     1 g 200 mL/hr over 30 Minutes Intravenous  Once 01/01/18 1714 01/01/18 1751      Assessment/Plan: s/p Procedure(s): PARTIAL COLECTOMY Impression: Stable on postoperative day 2.  Has had some drift down of his hemoglobin, but this is not unexpected.  Will advance to full liquid diet.  Okay for transfer to the regular floor pending bed situation.  Hypophosphatemia is being addressed.  LOS: 5 days     Aviva Signs 01/03/2018

## 2018-01-03 NOTE — Progress Notes (Signed)
PROGRESS NOTE  Randy Russo HQI:[id number] DOB: Jun 11, 1940 DOA: 12/27/2017 PCP: Randy Sites, MD  Brief History: Randy Russo a 77 y.o.malewith a medical history ofhypertension, COPD, who presented to the emergency department with complaints of almost passing out as well as a GI bleed. It seems that patient had gotten up this morning to use the bathroom and had a bowel movement. He was unable to get up from the commode, had fallen backwards at which point his eyes rolled back into his head and he had some shaking. Was reported by patient's wife. Patient does not recall these events. Patient's wife does not note any bloody bowel movement. Per EMS patient had a large bloody bowel movement. Patient feltweak and dizzy. Denies current chest pain, shortness of breath, abdominal pain, nausea or vomiting, constipation, problems with urination, numbness or tingling, recent illness or travel.  Interim history Admitted for near syncope secondary to GI Bleed. GI consulted, s/p colonoscopy.During the hospitalization, pt had 2 episodes of syncope which were likely vagally mediated. He continued to have hematochezia after the colonoscopy. Patient was given IVF as well as 5u PRBCsfor the admission. NM tagged RBC scan wasnegative. In am 01/01/18, pt had another 3 bloody BMs, and 2 more units of PRBCs ordered. General surgery consulted to assist with management.  He ultimately required right hemicolectomy on 01/01/18.  Assessment/Plan: LowerGI bleed/ Hematochezia/ Diverticular bleed/ Acute on chronic normocytic anemia/ Symptomatic anemia/ Anemia of acute blood loss -presented with hypotension, near syncopal event -Hemoglobin on admission 11.1.  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 None with stigmata of bleed. Small external  hemorrhoids. -Overnight on 10/6-7, patient continued to have several episodes of painless hematochezia as well as a syncopal event -overnight 10/8-9--pt had another syncopal episodewith hematochezia -01/01/18 AM--3 more bloody BMs with near syncope/syncope -NM RBC scan negative -Patient has been transfused10units PRBC during hospitalization -01/01/18--colonoscopy--blood in entire colon; active bleed from diverticula @ R-hepatic flexure with bleeding site not located -general surgery consult appreciated-->right hemicolectomy 10/10 -diet advancement per general surgery  Syncope -likely secondary to GI bleed/ -vasovagal -currently no neurological deficits -12/31/17 CT brain--neg -01/01/18 telemetry reviewed--short episodes of NS Vtach <10 beats -Mag 2.2  Hypotension -Resolved withIV resuscitation and blood transfusions -decrease IVF as pt now tolerating diet  Essential hypertension -presented with hypotension -hold lisinopril and monitor  -BP remains stable  COPD -Currently stable. No wheezing on exam -continue Spiriva and albuterol PRN  Weakness -secondary to anemia/GI bleed -PT consulted andrecommended HH, however patient has declined  Hypophosphatemia -repleted     Disposition Plan: Home when stable from surgery Family Communication:Spouse updatedat bedside 10/11  Consultants:GI--Rehmanand general surgery--Randy Russo  Code Status: FULL   DVT Prophylaxis: SCDs   Procedures: As Listed in Progress Note Above  Antibiotics: None    Subjective: Pt had small BM last night without blood.  States his abd pain is well controlled.  Denies f/c, cp, sob, n/v/d.  No headache  Objective: Vitals:   01/03/18 0700 01/03/18 0800 01/03/18 0850 01/03/18 0900  BP: (!) 154/76 (!) 150/74    Pulse: (!) 104 (!) 103  (!) 112  Resp: 19 (!) 26  17  Temp:   99.5 F (37.5 C)   TempSrc:   Oral   SpO2:      Weight:      Height:  Intake/Output Summary (Last 24 hours) at 01/03/2018 0910 Last data filed at 01/03/2018 0900 Gross per 24 hour  Intake 3124.62 ml  Output 3250 ml  Net -125.38 ml   Weight change: -2 kg Exam:   General:  Pt is alert, follows commands appropriately, not in acute distress  HEENT: No icterus, No thrush, No neck mass, [place]/AT  Cardiovascular: RRR, S1/S2, no rubs, no gallops  Respiratory:fine bibasilar crackles, no wheeze  Abdomen: Soft/+BS, mild incisional tender, non distended, no guarding  Extremities: No edema, No lymphangitis, No petechiae, No rashes, no synovitis   Data Reviewed: I have personally reviewed following labs and imaging studies Basic Metabolic Panel: Recent Labs  Lab 12/29/17 0724 12/30/17 0437 01/01/18 0120 01/02/18 0830 01/03/18 0438  NA 137 140 139 139 139  K 4.5 4.8 4.1 3.8 3.6  CL 111 112* 110 114* 111  CO2 23 25 24  20* 22  GLUCOSE 116* 123* 108* 129* 120*  BUN 18 12 17 12  7*  CREATININE 0.95 0.92 1.00 0.80 0.80  CALCIUM 7.6* 8.2* 7.9* 6.8* 7.6*  MG  --   --  2.2 1.9 2.0  PHOS  --   --   --  2.9 1.8*   Liver Function Tests: Recent Labs  Lab 12/27/17 1034 12/28/17 2013  AST 17 19  ALT 18 18  ALKPHOS 40 38  BILITOT 0.7 0.2*  PROT 6.1* 5.4*  ALBUMIN 3.1* 2.9*   No results for input(s): LIPASE, AMYLASE in the last 168 hours. No results for input(s): AMMONIA in the last 168 hours. Coagulation Profile: Recent Labs  Lab 12/27/17 1034 01/02/18 0830  INR 1.14 1.19   CBC: Recent Labs  Lab 12/29/17 0528  12/30/17 0437  01/01/18 0608 01/01/18 1338 01/01/18 2030 01/02/18 0830 01/03/18 0438  WBC 13.8*  --  12.1*  --  13.0*  --   --  19.0* 14.7*  NEUTROABS  --   --   --   --  8.3*  --   --   --   --   HGB 8.9*   < > 8.7*   < > 7.6* 9.5* 6.2* 9.0* 8.3*  HCT 26.5*   < > 26.0*   < > 24.2* 29.1* 19.6* 27.5* 25.6*  MCV 91.7  --  90.9  --  93.1  --   --  92.3 94.8  PLT 158  --  145*  --  171  --   --  96* 119*   < > = values in this  interval not displayed.   Cardiac Enzymes: Recent Labs  Lab 12/27/17 1034  TROPONINI <0.03   BNP: Invalid input(s): POCBNP CBG: Recent Labs  Lab 12/28/17 2006 12/29/17 1956  GLUCAP 130* 100*   HbA1C: No results for input(s): HGBA1C in the last 72 hours. Urine analysis:    Component Value Date/Time   COLORURINE YELLOW 05/09/2017 1705   APPEARANCEUR HAZY (A) 05/09/2017 1705   LABSPEC 1.017 05/09/2017 1705   PHURINE 5.0 05/09/2017 1705   GLUCOSEU NEGATIVE 05/09/2017 1705   HGBUR MODERATE (A) 05/09/2017 1705   BILIRUBINUR NEGATIVE 05/09/2017 1705   KETONESUR NEGATIVE 05/09/2017 1705   PROTEINUR NEGATIVE 05/09/2017 1705   UROBILINOGEN 0.2 09/13/2010 0850   NITRITE NEGATIVE 05/09/2017 1705   LEUKOCYTESUR SMALL (A) 05/09/2017 1705   Sepsis Labs: @LABRCNTIP (procalcitonin:4,lacticidven:4) ) Recent Results (from the past 240 hour(s))  MRSA PCR Screening     Status: None   Collection Time: 12/28/17 11:36 PM  Result Value Ref Range Status  MRSA by PCR NEGATIVE NEGATIVE Final    Comment:        The GeneXpert MRSA Assay (FDA approved for NASAL specimens only), is one component of a comprehensive MRSA colonization surveillance program. It is not intended to diagnose MRSA infection nor to guide or monitor treatment for MRSA infections. Performed at [facility], [address], [place], [place] [address]      Scheduled Meds: . pantoprazole  40 mg Oral Q1200   Continuous Infusions: . sodium chloride 100 mL/hr at 01/03/18 3220    Procedures/Studies: Ct Head Wo Contrast  Result Date: 12/31/2017 CLINICAL DATA:  Head trauma. Syncope. Impact to the back of the head. EXAM: CT HEAD WITHOUT CONTRAST TECHNIQUE: Contiguous axial images were obtained from the base of the skull through the vertex without intravenous contrast. COMPARISON:  Head CT 11/03/2007 FINDINGS: Brain: There is no mass, hemorrhage or extra-axial collection. The size and configuration of the ventricles and  extra-axial CSF spaces are normal. There is no acute or chronic infarction. There is hypoattenuation of the periventricular white matter, most commonly indicating chronic ischemic microangiopathy. Vascular: No abnormal hyperdensity of the major intracranial arteries or dural venous sinuses. No intracranial atherosclerosis. Skull: The visualized skull base, calvarium and extracranial soft tissues are normal. Sinuses/Orbits: No fluid levels or advanced mucosal thickening of the visualized paranasal sinuses. No mastoid or middle ear effusion. The orbits are normal. 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 Electronically Signed   By: Ulyses Jarred M.D.   On: 12/31/2017 01:45   Nm Gi Blood Loss  Result Date: 12/31/2017 CLINICAL DATA:  Recurrent GI bleeding EXAM: NUCLEAR MEDICINE GASTROINTESTINAL BLEEDING SCAN TECHNIQUE: Sequential abdominal images were obtained following intravenous administration of Tc-36m labeled red blood cells. RADIOPHARMACEUTICALS:  22.5 mCi Tc-60m pertechnetate in-vitro labeled red cells. COMPARISON:  CT abdomen and pelvis 02/05/2017 FINDINGS: Normal blood pool distribution of tracer. Minimal de labeled tracer within the bladder. No abnormal gastrointestinal localization of tracer identified to suggest active GI bleeding. IMPRESSION: Negative GI bleeding scan. Electronically Signed   By: Lavonia Dana M.D.   On: 12/31/2017 14:55    Orson Eva, DO  [facility] Pager [phone]  If 7PM-7AM, please contact night-coverage [email or web address] Password [email or web address] 01/03/2018, 9:10 AM   LOS: 5 days

## 2018-01-04 LAB — BPAM RBC
BLOOD PRODUCT EXPIRATION DATE: 201910202359
BLOOD PRODUCT EXPIRATION DATE: 201911032359
BLOOD PRODUCT EXPIRATION DATE: 201911042359
BLOOD PRODUCT EXPIRATION DATE: 201911042359
BLOOD PRODUCT EXPIRATION DATE: 201911112359
Blood Product Expiration Date: 201910312359
Blood Product Expiration Date: 201911022359
ISSUE DATE / TIME: 201910090333
ISSUE DATE / TIME: 201910100704
ISSUE DATE / TIME: 201910110345
ISSUE DATE / TIME: 201910090817
ISSUE DATE / TIME: 201910101033
ISSUE DATE / TIME: 201910102209
ISSUE DATE / TIME: 201910110046
UNIT TYPE AND RH: 1700
UNIT TYPE AND RH: 1700
UNIT TYPE AND RH: 5100
Unit Type and Rh: 1700
Unit Type and Rh: 1700
Unit Type and Rh: 7300
Unit Type and Rh: 9500

## 2018-01-04 LAB — TYPE AND SCREEN
ABO/RH(D): B POS
Antibody Screen: NEGATIVE
UNIT DIVISION: 0
UNIT DIVISION: 0
UNIT DIVISION: 0
Unit division: 0
Unit division: 0
Unit division: 0
Unit division: 0

## 2018-01-04 LAB — CBC
HEMATOCRIT: 26.4 % — AB (ref 39.0–52.0)
HEMOGLOBIN: 8.2 g/dL — AB (ref 13.0–17.0)
MCH: 29.5 pg (ref 26.0–34.0)
MCHC: 31.1 g/dL (ref 30.0–36.0)
MCV: 95 fL (ref 80.0–100.0)
NRBC: 0.8 % — AB (ref 0.0–0.2)
Platelets: 145 10*3/uL — ABNORMAL LOW (ref 150–400)
RBC: 2.78 MIL/uL — ABNORMAL LOW (ref 4.22–5.81)
RDW: 17.2 % — ABNORMAL HIGH (ref 11.5–15.5)
WBC: 12 10*3/uL — ABNORMAL HIGH (ref 4.0–10.5)

## 2018-01-04 LAB — PHOSPHORUS: PHOSPHORUS: 2.9 mg/dL (ref 2.5–4.6)

## 2018-01-04 LAB — BASIC METABOLIC PANEL
ANION GAP: 3 — AB (ref 5–15)
BUN: 5 mg/dL — ABNORMAL LOW (ref 8–23)
CO2: 26 mmol/L (ref 22–32)
Calcium: 8.1 mg/dL — ABNORMAL LOW (ref 8.9–10.3)
Chloride: 112 mmol/L — ABNORMAL HIGH (ref 98–111)
Creatinine, Ser: 0.83 mg/dL (ref 0.61–1.24)
Glucose, Bld: 102 mg/dL — ABNORMAL HIGH (ref 70–99)
POTASSIUM: 3.6 mmol/L (ref 3.5–5.1)
SODIUM: 141 mmol/L (ref 135–145)

## 2018-01-04 LAB — MAGNESIUM: MAGNESIUM: 2 mg/dL (ref 1.7–2.4)

## 2018-01-04 NOTE — Progress Notes (Signed)
PROGRESS NOTE  Randy Russo PTW:[id number] DOB: 1940/10/09 DOA: 12/27/2017 PCP: Sharilyn Sites, MD  Brief History: Randy Diss Wheeleris a 77 y.o.malewith a medical history ofhypertension, COPD, who presented to the emergency department with complaints of almost passing out as well as a GI bleed. It seems that patient had gotten up this morning to use the bathroom and had a bowel movement. He was unable to get up from the commode, had fallen backwards at which point his eyes rolled back into his head and he had some shaking. Was reported by patient's wife. Patient does not recall these events. Patient's wife does not note any bloody bowel movement. Per EMS patient had a large bloody bowel movement. Patient feltweak and dizzy. Denies current chest pain, shortness of breath, abdominal pain, nausea or vomiting, constipation, problems with urination, numbness or tingling, recent illness or travel.  Interim history Admitted for near syncope secondary to GI Bleed. GI consulted, s/p colonoscopy.During the hospitalization, pt had 2 episodes of syncope which were likely vagally mediated. He continued to have hematochezia after the colonoscopy. Patient was given IVF as well as 5u PRBCsfor the admission. NM tagged RBC scan wasnegative. In am 01/01/18, pt had another 3 bloody BMs, and 2 more units of PRBCs ordered. General surgery consulted to assist with management.  He ultimately required right hemicolectomy on 01/01/18.  Assessment/Plan: LowerGI bleed/ Hematochezia/ Diverticular bleed/Symptomatic anemia/ Anemia of acute blood loss -presented with hypotension, near syncopal event -Hemoglobin on admission 11.1.  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 None with stigmata of bleed. Small external hemorrhoids. -Overnight on 10/6-7,  patient continued to have several episodes of painless hematochezia as well as a syncopal event -overnight 10/8-9--pt had another syncopal episodewith hematochezia -01/01/18 AM--3 more bloody BMs with near syncope/syncope -NM RBC scan negative -Patient has been transfused10units PRBC during hospitalization -01/01/18--colonoscopy--blood in entire colon; active bleed from diverticula @ R-hepatic flexure with bleeding site not located -general surgery consultappreciated-->right hemicolectomy 10/10 -diet advancement per general surgery -01/04/18 early AM--+BM with some blood; Hgb stable  Syncope -likely secondary to GI bleed/ -vasovagal -currently no neurological deficits -12/31/17 CT brain--neg -01/01/18 telemetry reviewed--short episodes of NS Vtach <10 beats -Mag 2.2 -resolved  Hypotension -Resolved withIV resuscitation and blood transfusions -decrease IVF as pt now tolerating diet  Essential hypertension -presented with hypotension -hold lisinopril and monitor  -BP remains stable  COPD -Currently stable. No wheezing on exam -continue Spiriva and albuterol PRN  Weakness -secondary to anemia/GI bleed -PT consulted andrecommended [facility] however patient has declined -01/03/18--pt now agreeable to try PT again  Hypophosphatemia -repleted     Disposition Plan: Home hopefully 10/14 or 10/15 Family Communication:Spouse updatedat bedside 10/12  Consultants:GI--Rehmanand general surgery--[facility]  Code Status: FULL   DVT Prophylaxis: SCDs   Procedures: As Listed in Progress Note Above  Antibiotics: Perioperative ertapenem       Subjective: Pt had BM with some blood this am.  Feels generalized weakness.  Denies f/c, cp, sob, syncope.  abd pain controlled.. No vomiting, but has nausea  Objective: Vitals:   01/03/18 2300 01/04/18 0400 01/04/18 0636 01/04/18 0800  BP:   134/75   Pulse: 94  (!) 106 (!) 110  Resp: (!) 21  (!) 37  (!) 29  Temp: 99 F (37.2 C) 98.7 F (37.1 C)  99 F (37.2 C)  TempSrc: Oral Oral  Oral  SpO2:      Weight:  111 kg    Height:        Intake/Output Summary (Last 24 hours) at 01/04/2018 0908 Last data filed at 01/04/2018 0300 Gross per 24 hour  Intake 2006.98 ml  Output 1250 ml  Net 756.98 ml   Weight change: -1.5 kg Exam:   General:  Pt is alert, follows commands appropriately, not in acute distress  HEENT: No icterus, No thrush, No neck mass, [place]/AT  Cardiovascular: RRR, S1/S2, no rubs, no gallops  Respiratory: diminished BS bilateral with bibasilar crackles, no wheeze  Abdomen: Soft/+BS, non tender, non distended, no guarding  Extremities: No edema, No lymphangitis, No petechiae, No rashes, no synovitis   Data Reviewed: I have personally reviewed following labs and imaging studies Basic Metabolic Panel: Recent Labs  Lab 12/30/17 0437 01/01/18 0120 01/02/18 0830 01/03/18 0438 01/04/18 0422  NA 140 139 139 139 141  K 4.8 4.1 3.8 3.6 3.6  CL 112* 110 114* 111 112*  CO2 25 24 20* 22 26  GLUCOSE 123* 108* 129* 120* 102*  BUN 12 17 12  7* 5*  CREATININE 0.92 1.00 0.80 0.80 0.83  CALCIUM 8.2* 7.9* 6.8* 7.6* 8.1*  MG  --  2.2 1.9 2.0 2.0  PHOS  --   --  2.9 1.8* 2.9   Liver Function Tests: Recent Labs  Lab 12/28/17 2013  AST 19  ALT 18  ALKPHOS 38  BILITOT 0.2*  PROT 5.4*  ALBUMIN 2.9*   No results for input(s): LIPASE, AMYLASE in the last 168 hours. No results for input(s): AMMONIA in the last 168 hours. Coagulation Profile: Recent Labs  Lab 01/02/18 0830  INR 1.19   CBC: Recent Labs  Lab 12/30/17 0437  01/01/18 0608 01/01/18 1338 01/01/18 2030 01/02/18 0830 01/03/18 0438 01/04/18 0422  WBC 12.1*  --  13.0*  --   --  19.0* 14.7* 12.0*  NEUTROABS  --   --  8.3*  --   --   --   --   --   HGB 8.7*   < > 7.6* 9.5* 6.2* 9.0* 8.3* 8.2*  HCT 26.0*   < > 24.2* 29.1* 19.6* 27.5* 25.6* 26.4*  MCV 90.9  --  93.1  --   --  92.3 94.8 95.0  PLT  145*  --  171  --   --  96* 119* 145*   < > = values in this interval not displayed.   Cardiac Enzymes: No results for input(s): CKTOTAL, CKMB, CKMBINDEX, TROPONINI in the last 168 hours. BNP: Invalid input(s): POCBNP CBG: Recent Labs  Lab 12/28/17 2006 12/29/17 1956  GLUCAP 130* 100*   HbA1C: No results for input(s): HGBA1C in the last 72 hours. Urine analysis:    Component Value Date/Time   COLORURINE YELLOW 05/09/2017 1705   APPEARANCEUR HAZY (A) 05/09/2017 1705   LABSPEC 1.017 05/09/2017 1705   PHURINE 5.0 05/09/2017 1705   GLUCOSEU NEGATIVE 05/09/2017 1705   HGBUR MODERATE (A) 05/09/2017 1705   BILIRUBINUR NEGATIVE 05/09/2017 [place] 05/09/2017 1705   PROTEINUR NEGATIVE 05/09/2017 1705   UROBILINOGEN 0.2 09/13/2010 0850   NITRITE NEGATIVE 05/09/2017 1705   LEUKOCYTESUR SMALL (A) 05/09/2017 1705   Sepsis Labs: @LABRCNTIP (procalcitonin:4,lacticidven:4) ) Recent Results (from the past 240 hour(s))  MRSA PCR Screening     Status: None   Collection Time: 12/28/17 11:36 PM  Result Value Ref Range Status   MRSA by PCR NEGATIVE NEGATIVE Final    Comment:  The GeneXpert MRSA Assay (FDA approved for NASAL specimens only), is one component of a comprehensive MRSA colonization surveillance program. It is not intended to diagnose MRSA infection nor to guide or monitor treatment for MRSA infections. Performed at [facility], [address] [place],  [address]      Scheduled Meds: . pantoprazole  40 mg Oral Q1200   Continuous Infusions: . sodium chloride 50 mL/hr at 01/03/18 1413    Procedures/Studies: Ct Head Wo Contrast  Result Date: 12/31/2017 CLINICAL DATA:  Head trauma. Syncope. Impact to the back of the head. EXAM: CT HEAD WITHOUT CONTRAST TECHNIQUE: Contiguous axial images were obtained from the base of the skull through the vertex without intravenous contrast. COMPARISON:  Head CT 11/03/2007 FINDINGS: Brain: There is no  mass, hemorrhage or extra-axial collection. The size and configuration of the ventricles and extra-axial CSF spaces are normal. There is no acute or chronic infarction. There is hypoattenuation of the periventricular white matter, most commonly indicating chronic ischemic microangiopathy. Vascular: No abnormal hyperdensity of the major intracranial arteries or dural venous sinuses. No intracranial atherosclerosis. Skull: The visualized skull base, calvarium and extracranial soft tissues are normal. Sinuses/Orbits: No fluid levels or advanced mucosal thickening of the visualized paranasal sinuses. No mastoid or middle ear effusion. The orbits are normal. 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 Electronically Signed   By: Ulyses Jarred M.D.   On: 12/31/2017 01:45   Nm Gi Blood Loss  Result Date: 12/31/2017 CLINICAL DATA:  Recurrent GI bleeding EXAM: NUCLEAR MEDICINE GASTROINTESTINAL BLEEDING SCAN TECHNIQUE: Sequential abdominal images were obtained following intravenous administration of Tc-3m labeled red blood cells. RADIOPHARMACEUTICALS:  22.5 mCi Tc-78m pertechnetate in-vitro labeled red cells. COMPARISON:  CT abdomen and pelvis 02/05/2017 FINDINGS: Normal blood pool distribution of tracer. Minimal de labeled tracer within the bladder. No abnormal gastrointestinal localization of tracer identified to suggest active GI bleeding. IMPRESSION: Negative GI bleeding scan. Electronically Signed   By: Lavonia Dana M.D.   On: 12/31/2017 14:55    Orson Eva, DO  [facility] Pager [phone]  If 7PM-7AM, please contact night-coverage [email or web address] Password [email or web address] 01/04/2018, 9:08 AM   LOS: 6 days

## 2018-01-04 NOTE — Progress Notes (Addendum)
3 Days Post-Op  Subjective: Patient sitting up in chair.  He has no complaints.  Has continued to have bowel movements.  Less blood noted.  Objective: Vital signs in last 24 hours: Temp:  [98.5 F (36.9 C)-99.4 F (37.4 C)] 98.9 F (37.2 C) (10/13 1112) Pulse Rate:  [94-123] 105 (10/13 1112) Resp:  [17-39] 19 (10/13 1112) BP: (132-134)/(75-85) 132/85 (10/13 1000) Weight:  [756 kg] 111 kg (10/13 0400) Last BM Date: 01/01/18  Intake/Output from previous day: 10/12 0701 - 10/13 0700 In: 3043.3 [P.O.:480; I.V.:2211.4; IV Piggyback:351.9] Out: 2150 [Urine:2150] Intake/Output this shift: Total I/O In: 410.3 [I.V.:410.3] Out: -   General appearance: alert, cooperative and no distress GI: Soft, incision healing well.  Bowel sounds active.  Nondistended.  Lab Results:  Recent Labs    01/03/18 0438 01/04/18 0422  WBC 14.7* 12.0*  HGB 8.3* 8.2*  HCT 25.6* 26.4*  PLT 119* 145*   BMET Recent Labs    01/03/18 0438 01/04/18 0422  NA 139 141  K 3.6 3.6  CL 111 112*  CO2 22 26  GLUCOSE 120* 102*  BUN 7* 5*  CREATININE 0.80 0.83  CALCIUM 7.6* 8.1*   PT/INR Recent Labs    01/02/18 0830  LABPROT 15.0  INR 1.19    Studies/Results: No results found.  Anti-infectives: Anti-infectives (From admission, onward)   Start     Dose/Rate Route Frequency Ordered Stop   01/01/18 1715  ertapenem (INVANZ) 1,000 mg in sodium chloride 0.9 % 100 mL IVPB     1 g 200 mL/hr over 30 Minutes Intravenous  Once 01/01/18 1714 01/01/18 1751      Assessment/Plan: s/p Procedure(s): PARTIAL COLECTOMY Impression: Continues to be stable on postoperative day 3.  Bowel function has returned.  Hemoglobin remaining stable over past 24 hours. Plan: We will advance to soft diet.  As patient is now hemodynamically stable, will remove Foley catheter.  LOS: 6 days    Aviva Signs 01/04/2018

## 2018-01-04 NOTE — Progress Notes (Signed)
Patient has first post-procedure BM and was noted to be bloody. Patient reported the provider informed him not to worry but the BM would in fact contain or be bloody. -will continue to monitor both via stool produced and blood pressure (set to cycle every 3m). -will pass along in report

## 2018-01-05 ENCOUNTER — Encounter (HOSPITAL_COMMUNITY): Payer: Self-pay | Admitting: General Surgery

## 2018-01-05 LAB — BASIC METABOLIC PANEL
Anion gap: 7 (ref 5–15)
BUN: 7 mg/dL — AB (ref 8–23)
CO2: 23 mmol/L (ref 22–32)
CREATININE: 0.69 mg/dL (ref 0.61–1.24)
Calcium: 7.9 mg/dL — ABNORMAL LOW (ref 8.9–10.3)
Chloride: 108 mmol/L (ref 98–111)
GFR calc Af Amer: >60 mL/min (ref >60)
GFR calc non Af Amer: >60 mL/min (ref >60)
Glucose, Bld: 124 mg/dL — ABNORMAL HIGH (ref 70–99)
Potassium: 3.3 mmol/L — ABNORMAL LOW (ref 3.5–5.1)
Sodium: 138 mmol/L (ref 135–145)

## 2018-01-05 LAB — CBC
HEMATOCRIT: 28.3 % — AB (ref 39.0–52.0)
Hemoglobin: 8.9 g/dL — ABNORMAL LOW (ref 13.0–17.0)
MCH: 28.8 pg (ref 26.0–34.0)
MCHC: 31.4 g/dL (ref 30.0–36.0)
MCV: 91.6 fL (ref 80.0–100.0)
Platelets: 189 10*3/uL (ref 150–400)
RBC: 3.09 MIL/uL — ABNORMAL LOW (ref 4.22–5.81)
RDW: 16.2 % — ABNORMAL HIGH (ref 11.5–15.5)
WBC: 12 10*3/uL — ABNORMAL HIGH (ref 4.0–10.5)
nRBC: 0.3 % — ABNORMAL HIGH (ref 0.0–0.2)

## 2018-01-05 LAB — PHOSPHORUS: Phosphorus: 2.8 mg/dL (ref 2.5–4.6)

## 2018-01-05 LAB — MAGNESIUM: Magnesium: 2.2 mg/dL (ref 1.7–2.4)

## 2018-01-05 MED ORDER — POTASSIUM CHLORIDE 10 MEQ/100ML IV SOLN
10.0000 meq | INTRAVENOUS | Status: AC
  Administered 2018-01-05 (×4): 10 meq via INTRAVENOUS
  Filled 2018-01-05 (×4): qty 100

## 2018-01-05 MED ORDER — POTASSIUM CHLORIDE IN NACL 20-0.9 MEQ/L-% IV SOLN
INTRAVENOUS | Status: DC
  Administered 2018-01-05 – 2018-01-06 (×2): via INTRAVENOUS

## 2018-01-05 MED ORDER — SODIUM CHLORIDE 0.9 % IV BOLUS
500.0000 mL | Freq: Once | INTRAVENOUS | Status: AC
  Administered 2018-01-05: 500 mL via INTRAVENOUS

## 2018-01-05 MED ORDER — ALUM & MAG HYDROXIDE-SIMETH 200-200-20 MG/5ML PO SUSP
30.0000 mL | ORAL | Status: DC | PRN
  Administered 2018-01-05 – 2018-01-07 (×4): 30 mL via ORAL
  Filled 2018-01-05 (×4): qty 30

## 2018-01-05 NOTE — Care Management Important Message (Signed)
Important Message  Patient Details  Name: AZIR MUZYKA MRN: [id number] Date of Birth: Jan 24, 1941   Medicare Important Message Given:  Yes    Shelda Altes 01/05/2018, 10:32 AM

## 2018-01-05 NOTE — Progress Notes (Signed)
4 Days Post-Op  Subjective: Patient had episode of emesis last night.  He continues to have flatus and bowel movements.  Objective: Vital signs in last 24 hours: Temp:  [98.2 F (36.8 C)-99 F (37.2 C)] 98.2 F (36.8 C) (10/14 0000) Pulse Rate:  [99-118] 118 (10/14 0400) Resp:  [16-31] 19 (10/14 0400) BP: (132-160)/(74-90) 160/90 (10/14 0400) SpO2:  [95 %-98 %] 97 % (10/14 0731) Last BM Date: 01/04/18  Intake/Output from previous day: 10/13 0701 - 10/14 0700 In: 779.4 [I.V.:779.4] Out: 800 [Urine:800] Intake/Output this shift: No intake/output data recorded.  General appearance: alert, cooperative and no distress GI: Soft, nontender, nondistended.  Active bowel sounds appreciated.  Incision healing well.  Lab Results:  Recent Labs    01/04/18 0422 01/05/18 0424  WBC 12.0* 12.0*  HGB 8.2* 8.9*  HCT 26.4* 28.3*  PLT 145* 189   BMET Recent Labs    01/04/18 0422 01/05/18 0424  NA 141 138  K 3.6 3.3*  CL 112* 108  CO2 26 23  GLUCOSE 102* 124*  BUN 5* 7*  CREATININE 0.83 0.69  CALCIUM 8.1* 7.9*   PT/INR Recent Labs    01/02/18 0830  LABPROT 15.0  INR 1.19    Studies/Results: No results found.  Anti-infectives: Anti-infectives (From admission, onward)   Start     Dose/Rate Route Frequency Ordered Stop   01/01/18 1715  ertapenem (INVANZ) 1,000 mg in sodium chloride 0.9 % 100 mL IVPB     1 g 200 mL/hr over 30 Minutes Intravenous  Once 01/01/18 1714 01/01/18 1751      Assessment/Plan: s/p Procedure(s): PARTIAL COLECTOMY Impression: Stable on postoperative day 4.  Has had an episode of emesis.  This may be related to his return of bowel function and his first time eating.  We will get PT consultation for ambulation and assessment.  Have added Maalox to patient's regimen as he said he had an upset stomach which caused his emesis.  Hemoglobin stable.  LOS: 7 days    Aviva Signs 01/05/2018

## 2018-01-05 NOTE — Evaluation (Signed)
Physical Therapy Evaluation Patient Details Name: Randy Russo MRN: [id number] DOB: 1940/08/02 Today's Date: 01/05/2018   History of Present Illness  Randy Russo is a 77 y.o. male s/p Right hemicolectomy 01/01/18, with a medical history of hypertension, COPD, who presented to the emergency department with complaints of almost passing out as well as a GI bleed.  It seems that patient had gotten up this morning to use the bathroom and had a bowel movement.  He was unable to get up from the commode, had fallen backwards at which point his eyes rolled back into his head and he had some shaking.  Was reported by patient's wife.  Patient does not recall these events.  Patient's wife does not note any bloody bowel movement.  Per EMS patient had a large bloody bowel movement.  Patient does feel weak and dizzy at this time.  Denies current chest pain, shortness of breath, abdominal pain, nausea or vomiting, constipation, problems with urination, numbness or tingling, recent illness or travel.    Clinical Impression  Patient demonstrates slightly labored slow movement for sitting up, transfers and ambulation using RW, mostly limited due to fatigue, right sided abdominal pain and c/o nausea.  Patient requested to go back to bed after therapy with visitor present in room.  Patient will benefit from continued physical therapy in hospital and recommended venue below to increase strength, balance, endurance for safe ADLs and gait.    Follow Up Recommendations Home health PT;Supervision - Intermittent    Equipment Recommendations       Recommendations for Other Services       Precautions / Restrictions Precautions Precautions: Fall Restrictions Weight Bearing Restrictions: No      Mobility  Bed Mobility Overal bed mobility: Modified Independent                Transfers Overall transfer level: Needs assistance Equipment used: Rolling walker (2 wheeled) Transfers: Sit to/from  Stand;Stand Pivot Transfers Sit to Stand: Min guard Stand pivot transfers: Min guard       General transfer comment: slow labored movement  Ambulation/Gait Ambulation/Gait assistance: Supervision Gait Distance (Feet): 45 Feet Assistive device: Rolling walker (2 wheeled) Gait Pattern/deviations: Decreased step length - right;Decreased step length - left;Decreased stride length Gait velocity: decreased   General Gait Details: slow slightly labored cadence, limited mostly due to fatigue and right abdominal discomfort  Stairs            Wheelchair Mobility    Modified Rankin (Stroke Patients Only)       Balance Overall balance assessment: Needs assistance Sitting-balance support: Feet supported;No upper extremity supported Sitting balance-Leahy Scale: Good     Standing balance support: No upper extremity supported;During functional activity Standing balance-Leahy Scale: Poor Standing balance comment: fair/poor without AD, fair using RW                             Pertinent Vitals/Pain Pain Assessment: 0-10 Pain Score: 5  Pain Location: left side of abdomen at surgical site Pain Descriptors / Indicators: Sore;Discomfort Pain Intervention(s): Limited activity within patient's tolerance;Monitored during session    Home Living Family/patient expects to be discharged to:: Private residence Living Arrangements: Spouse/significant other   Type of Home: House Home Access: Level entry     Home Layout: One level Home Equipment: Cane - single point      Prior Function Level of Independence: Independent with assistive device(s)  Comments: Hydrographic surveyor with SPC PRN     Hand Dominance        Extremity/Trunk Assessment   Upper Extremity Assessment Upper Extremity Assessment: Generalized weakness    Lower Extremity Assessment Lower Extremity Assessment: Generalized weakness    Cervical / Trunk Assessment Cervical / Trunk  Assessment: Normal  Communication   Communication: No difficulties  Cognition Arousal/Alertness: Awake/alert Behavior During Therapy: WFL for tasks assessed/performed Overall Cognitive Status: Within Functional Limits for tasks assessed                                        General Comments      Exercises     Assessment/Plan    PT Assessment Patient needs continued PT services  PT Problem List Decreased strength;Decreased activity tolerance;Decreased balance;Decreased mobility;Pain(right sided abdominal pain)       PT Treatment Interventions Gait training;Stair training;Functional mobility training;Therapeutic activities;Therapeutic exercise;Patient/family education    PT Goals (Current goals can be found in the Care Plan section)  Acute Rehab PT Goals Patient Stated Goal: return home PT Goal Formulation: With patient/family Time For Goal Achievement: 01/12/18 Potential to Achieve Goals: Good    Frequency Min 3X/week   Barriers to discharge        Co-evaluation               AM-PAC PT "6 Clicks" Daily Activity  Outcome Measure Difficulty turning over in bed (including adjusting bedclothes, sheets and blankets)?: None Difficulty moving from lying on back to sitting on the side of the bed? : None Difficulty sitting down on and standing up from a chair with arms (e.g., wheelchair, bedside commode, etc,.)?: A Little Help needed moving to and from a bed to chair (including a wheelchair)?: A Little Help needed walking in hospital room?: A Little Help needed climbing 3-5 steps with a railing? : A Little 6 Click Score: 20    End of Session   Activity Tolerance: Patient tolerated treatment well;Patient limited by fatigue Patient left: in bed;with call bell/phone within reach;with family/visitor present Nurse Communication: Mobility status PT Visit Diagnosis: Unsteadiness on feet (R26.81);Other abnormalities of gait and mobility (R26.89);Muscle  weakness (generalized) (M62.81)    Time: 1010-1033 PT Time Calculation (min) (ACUTE ONLY): 23 min   Charges:   PT Evaluation $PT Eval Moderate Complexity: 1 Mod PT Treatments $Therapeutic Activity: 23-37 mins        2:05 PM, 01/05/18 Lonell Grandchild, MPT Physical Therapist with [facility] [phone] office [phone] mobile phone

## 2018-01-05 NOTE — Plan of Care (Signed)
  Flowsheets (Taken 01/05/2018 1407) Pt will go Supine/Side to Sit: Independently   2:08 PM, 01/05/18 Lonell Grandchild, MPT Physical Therapist with [facility] [phone] office [phone] mobile phone

## 2018-01-05 NOTE — Progress Notes (Signed)
PROGRESS NOTE  JERNARD REIBER BPZ:[id number] DOB: [birth year] DOA: 12/27/2017 PCP: Sharilyn Sites, MD   Brief History: Randy Diss Wheeleris a 77 y.o.malewith a medical history ofhypertension, COPD, who presented to the emergency department with complaints of almost passing out as well as a GI bleed. It seems that patient had gotten up this morning to use the bathroom and had a bowel movement. He was unable to get up from the commode, had fallen backwards at which point his eyes rolled back into his head and he had some shaking. Was reported by patient's wife. Patient does not recall these events. Patient's wife does not note any bloody bowel movement. Per EMS patient had a large bloody bowel movement. Patient feltweak and dizzy. Denies current chest pain, shortness of breath, abdominal pain, nausea or vomiting, constipation, problems with urination, numbness or tingling, recent illness or travel.  Interim history Admitted for near syncope secondary to GI Bleed. GI consulted, s/p colonoscopy.During the hospitalization, pt had 2 episodes of syncope which were likely vagally mediated. He continued to have hematochezia after the colonoscopy. Patient was given IVF as well as 5u PRBCsfor the admission. NM tagged RBC scan wasnegative. In am 01/01/18, pt had another 3 bloody BMs, and 2 more units of PRBCs ordered. General surgery consulted to assist with management. He ultimately required right hemicolectomy on 01/01/18. He received 3 additional units PRBC in perioperative period, making a total of 10 units PRBC for the admission.  Postoperatively, he has had BMs with small amounts of blood, but his Hgb has remained largely stable.  Assessment/Plan: LowerGI bleed/ Hematochezia/ Diverticular bleed/Symptomatic anemia/ Anemia of acute blood loss -presented with hypotension, near syncopal event -Hemoglobin on admission 11.1.  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 None with stigmata of bleed. Small external hemorrhoids. -Overnight on 10/6-7, patient continued to have several episodes of painless hematochezia as well as a syncopal event -overnight 10/8-9--pt had another syncopal episodewith hematochezia -01/01/18 AM--3 more bloody BMs with near syncope/syncope -NM RBC scan negative -Patient has been transfused10units PRBC during hospitalization -01/01/18--colonoscopy--blood in entire colon; active bleed from diverticula @ R-hepatic flexure with bleeding site not located -general surgery consultappreciated-->right hemicolectomy 01/01/18 -diet advancement per general surgery -10/13 and 10/14--+BM with small amount blood; Hgb stable -01/04/18--one episode of emesis  Syncope -likely secondary to GI bleed/ -vasovagal -currently no neurological deficits -12/31/17 CT brain--neg -01/01/18 telemetry reviewed--short episodes of NS Vtach <10 beats -Mag 2.2 -resolved  Hypotension -Resolved withIV resuscitation and blood transfusions -decrease IVF as pt now tolerating diet  Essential hypertension -presented with hypotension -hold lisinopril and monitor -BP remains stable  COPD -Currently stable. No wheezing on exam -continue Spiriva and albuterol PRN  Weakness -secondary to anemia/GI bleed -PT consulted andrecommended HH, however patient has declined -01/03/18--pt now agreeable to try PT again-->HHPT  Hypophosphatemia -repleted  Hypokalemia -replete -add KCl to IVF     Disposition Plan: Home hopefully 10/15 or 10/16 Family Communication:No family present  Consultants:GI--Rehmanand general surgery--[facility]kins  Code Status: FULL   DVT Prophylaxis: SCDs   Procedures: As Listed in Progress Note Above  Antibiotics: Perioperative ertapenem    Subjective: Pt had one  episode emesis last night.  Had another small BM with small amount of blood.  Had small BM this am without blood.  Has incisional abd pain.   No cp or sob.  Has some nausea.  No f/c or headache  Objective: Vitals:   01/05/18 0400 01/05/18 0731 01/05/18 0800 01/05/18 1157  BP: (!) 160/90  (!) 148/89   Pulse: (!) 118  (!) 110   Resp: 19  (!) 27   Temp:  98.7 F (37.1 C)  98.3 F (36.8 C)  TempSrc:  Oral  Oral  SpO2: 95% 97%    Weight:      Height:        Intake/Output Summary (Last 24 hours) at 01/05/2018 1505 Last data filed at 01/05/2018 1028 Gross per 24 hour  Intake 369.17 ml  Output 1200 ml  Net -830.83 ml   Weight change:  Exam:   General:  Pt is alert, follows commands appropriately, not in acute distress  HEENT: No icterus, No thrush, No neck mass, [place]/AT  Cardiovascular: RRR, S1/S2, no rubs, no gallops  Respiratory: CTA bilaterally, no wheezing, no crackles, no rhonchi  Abdomen: Soft/+BS, non tender, non distended, no guarding  Extremities: No edema, No lymphangitis, No petechiae, No rashes, no synovitis   Data Reviewed: I have personally reviewed following labs and imaging studies Basic Metabolic Panel: Recent Labs  Lab 01/01/18 0120 01/02/18 0830 01/03/18 0438 01/04/18 0422 01/05/18 0424  NA 139 139 139 141 138  K 4.1 3.8 3.6 3.6 3.3*  CL 110 114* 111 112* 108  CO2 24 20* 22 26 23   GLUCOSE 108* 129* 120* 102* 124*  BUN 17 12 7* 5* 7*  CREATININE 1.00 0.80 0.80 0.83 0.69  CALCIUM 7.9* 6.8* 7.6* 8.1* 7.9*  MG 2.2 1.9 2.0 2.0 2.2  PHOS  --  2.9 1.8* 2.9 2.8   Liver Function Tests: No results for input(s): AST, ALT, ALKPHOS, BILITOT, PROT, ALBUMIN in the last 168 hours. No results for input(s): LIPASE, AMYLASE in the last 168 hours. No results for input(s): AMMONIA in the last 168 hours. Coagulation Profile: Recent Labs  Lab 01/02/18 0830  INR 1.19   CBC: Recent Labs  Lab 01/01/18 0608  01/01/18 2030 01/02/18 0830 01/03/18 0438  01/04/18 0422 01/05/18 0424  WBC 13.0*  --   --  19.0* 14.7* 12.0* 12.0*  NEUTROABS 8.3*  --   --   --   --   --   --   HGB 7.6*   < > 6.2* 9.0* 8.3* 8.2* 8.9*  HCT 24.2*   < > 19.6* 27.5* 25.6* 26.4* 28.3*  MCV 93.1  --   --  92.3 94.8 95.0 91.6  PLT 171  --   --  96* 119* 145* 189   < > = values in this interval not displayed.   Cardiac Enzymes: No results for input(s): CKTOTAL, CKMB, CKMBINDEX, TROPONINI in the last 168 hours. BNP: Invalid input(s): POCBNP CBG: Recent Labs  Lab 12/29/17 1956  GLUCAP 100*   HbA1C: No results for input(s): HGBA1C in the last 72 hours. Urine analysis:    Component Value Date/Time   COLORURINE YELLOW 05/09/2017 1705   APPEARANCEUR HAZY (A) 05/09/2017 1705   LABSPEC 1.017 05/09/2017 1705   PHURINE 5.0 05/09/2017 1705   GLUCOSEU NEGATIVE 05/09/2017 1705   HGBUR MODERATE (A) 05/09/2017 1705   BILIRUBINUR NEGATIVE 05/09/2017 [place] 05/09/2017 1705   PROTEINUR NEGATIVE 05/09/2017 1705   UROBILINOGEN 0.2 09/13/2010 0850   NITRITE NEGATIVE 05/09/2017 1705   LEUKOCYTESUR SMALL (A) 05/09/2017 1705   Sepsis Labs: @LABRCNTIP (procalcitonin:4,lacticidven:4) ) Recent Results (from the past 240 hour(s))  MRSA PCR Screening     Status: None  Collection Time: 12/28/17 11:36 PM  Result Value Ref Range Status   MRSA by PCR NEGATIVE NEGATIVE Final    Comment:        The GeneXpert MRSA Assay (FDA approved for NASAL specimens only), is one component of a comprehensive MRSA colonization surveillance program. It is not intended to diagnose MRSA infection nor to guide or monitor treatment for MRSA infections. Performed at [facility], [address], [place], [place] [address]      Scheduled Meds: . pantoprazole  40 mg Oral Q1200   Continuous Infusions: . sodium chloride 50 mL/hr (01/05/18 0612)    Procedures/Studies: Ct Head Wo Contrast  Result Date: 12/31/2017 CLINICAL DATA:  Head trauma. Syncope. Impact to the back  of the head. EXAM: CT HEAD WITHOUT CONTRAST TECHNIQUE: Contiguous axial images were obtained from the base of the skull through the vertex without intravenous contrast. COMPARISON:  Head CT 11/03/2007 FINDINGS: Brain: There is no mass, hemorrhage or extra-axial collection. The size and configuration of the ventricles and extra-axial CSF spaces are normal. There is no acute or chronic infarction. There is hypoattenuation of the periventricular white matter, most commonly indicating chronic ischemic microangiopathy. Vascular: No abnormal hyperdensity of the major intracranial arteries or dural venous sinuses. No intracranial atherosclerosis. Skull: The visualized skull base, calvarium and extracranial soft tissues are normal. Sinuses/Orbits: No fluid levels or advanced mucosal thickening of the visualized paranasal sinuses. No mastoid or middle ear effusion. The orbits are normal. 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 Electronically Signed   By: Ulyses Jarred M.D.   On: 12/31/2017 01:45   Nm Gi Blood Loss  Result Date: 12/31/2017 CLINICAL DATA:  Recurrent GI bleeding EXAM: NUCLEAR MEDICINE GASTROINTESTINAL BLEEDING SCAN TECHNIQUE: Sequential abdominal images were obtained following intravenous administration of Tc-77m labeled red blood cells. RADIOPHARMACEUTICALS:  22.5 mCi Tc-32m pertechnetate in-vitro labeled red cells. COMPARISON:  CT abdomen and pelvis 02/05/2017 FINDINGS: Normal blood pool distribution of tracer. Minimal de labeled tracer within the bladder. No abnormal gastrointestinal localization of tracer identified to suggest active GI bleeding. IMPRESSION: Negative GI bleeding scan. Electronically Signed   By: Lavonia Dana M.D.   On: 12/31/2017 14:55    Orson Eva, DO  [facility] Pager [phone]  If 7PM-7AM, please contact night-coverage [email or web address] Password [email or web address] 01/05/2018, 3:05 PM   LOS: 7 days

## 2018-01-06 ENCOUNTER — Encounter (HOSPITAL_COMMUNITY): Payer: Self-pay | Admitting: Internal Medicine

## 2018-01-06 LAB — COMPREHENSIVE METABOLIC PANEL
ALT: 34 U/L (ref 0–44)
AST: 22 U/L (ref 15–41)
Albumin: 2.4 g/dL — ABNORMAL LOW (ref 3.5–5.0)
Alkaline Phosphatase: 35 U/L — ABNORMAL LOW (ref 38–126)
Anion gap: 4 — ABNORMAL LOW (ref 5–15)
BUN: 9 mg/dL (ref 8–23)
CO2: 23 mmol/L (ref 22–32)
Calcium: 7.5 mg/dL — ABNORMAL LOW (ref 8.9–10.3)
Chloride: 109 mmol/L (ref 98–111)
Creatinine, Ser: 0.72 mg/dL (ref 0.61–1.24)
GFR calc Af Amer: >60 mL/min (ref >60)
GFR calc non Af Amer: >60 mL/min (ref >60)
Glucose, Bld: 119 mg/dL — ABNORMAL HIGH (ref 70–99)
Potassium: 3.5 mmol/L (ref 3.5–5.1)
Sodium: 136 mmol/L (ref 135–145)
Total Bilirubin: 0.8 mg/dL (ref 0.3–1.2)
Total Protein: 4.9 g/dL — ABNORMAL LOW (ref 6.5–8.1)

## 2018-01-06 LAB — CBC
HCT: 26.7 % — ABNORMAL LOW (ref 39.0–52.0)
Hemoglobin: 8.3 g/dL — ABNORMAL LOW (ref 13.0–17.0)
MCH: 28.8 pg (ref 26.0–34.0)
MCHC: 31.1 g/dL (ref 30.0–36.0)
MCV: 92.7 fL (ref 80.0–100.0)
Platelets: 189 10*3/uL (ref 150–400)
RBC: 2.88 MIL/uL — ABNORMAL LOW (ref 4.22–5.81)
RDW: 15.9 % — ABNORMAL HIGH (ref 11.5–15.5)
WBC: 5.2 10*3/uL (ref 4.0–10.5)
nRBC: 0.8 % — ABNORMAL HIGH (ref 0.0–0.2)

## 2018-01-06 NOTE — Care Management Note (Signed)
Case Management Note  Patient Details  Name: Randy Russo MRN: [id number] Date of Birth: 1940/10/24  Subjective/Objective:                    Action/Plan: Patient now agreeable to home health. Elects [facility]. Referral given to [name] who will obtain orders when available.    Expected Discharge Date:      01/07/2018            Expected Discharge Plan:  Home/Self Care  In-House Referral:     Discharge planning Services  CM Consult  Post Acute Care Choice:  Durable Medical Equipment, Home Health Choice offered to:  Patient  DME Arranged:  [place] [facility] DME Agency:  [place]:  Patient Refused [place] Agency:     Status of Service:  Completed, signed off  If discussed at [place] of Stay Meetings, dates discussed:    Additional Comments:  Aireana Ryland, Chauncey Reading, RN 01/06/2018, 2:29 PM

## 2018-01-06 NOTE — Progress Notes (Signed)
PROGRESS NOTE  Randy Russo YHC:[id number] DOB: 1941/02/15 DOA: 12/27/2017 PCP: Sharilyn Sites, MD   Brief History: Randy Russo Randy Russo a 77 y.o.malewith a medical history ofhypertension, COPD, who presented to the emergency department with complaints of almost passing out as well as a GI bleed. It seems that patient had gotten up this morning to use the bathroom and had a bowel movement. He was unable to get up from the commode, had fallen backwards at which point his eyes rolled back into his head and he had some shaking. Was reported by patient's wife. Patient does not recall these events. Patient's wife does not note any bloody bowel movement. Per EMS patient had a large bloody bowel movement. Patient feltweak and dizzy. Denies current chest pain, shortness of breath, abdominal pain, nausea or vomiting, constipation, problems with urination, numbness or tingling, recent illness or travel.  Interim history Admitted for near syncope secondary to GI Bleed. GI consulted, s/p colonoscopy.During the hospitalization, pt had 2 episodes of syncope which were likely vagally mediated. He continued to have hematochezia after the colonoscopy. Patient was given IVF as well as 5u PRBCsfor the admission. NM tagged RBC scan wasnegative. In am 01/01/18, pt had another 3 bloody BMs, and 2 more units of PRBCs ordered. General surgery consulted to assist with management. He ultimately required right hemicolectomy on 01/01/18. He received 3 additional units PRBC in perioperative period, making a total of 10 units PRBC for the admission.  Postoperatively, he has had BMs with small amounts of blood, but his Hgb has remained largely stable.  Assessment/Plan: LowerGI bleed/ Hematochezia/ Diverticular bleed/Symptomatic anemia/ Anemia of acute blood loss -presented with hypotension, near syncopal event -Hemoglobin on admission 11.1.  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 None with stigmata of bleed. Small external hemorrhoids. -Overnight on 10/6-7, patient continued to have several episodes of painless hematochezia as well as a syncopal event -overnight 10/8-9--pt had another syncopal episodewith hematochezia -01/01/18 AM--3 more bloody BMs with near syncope/syncope -NM RBC scan negative -Patient has been transfused10units PRBC during hospitalization -01/01/18--colonoscopy--blood in entire colon; active bleed from diverticula @ R-hepatic flexure with bleeding site not located -general surgery consultappreciated-->right hemicolectomy 01/01/18 -diet advancement per general surgery -10/13 and 10/14--+BM with small amount blood; Hgb stable -01/06/18--c/o odynophagia; +BM without blood  Odynophagia -reconsult GI -new complaint since advancing diet to soft diet  Syncope -likely secondary to GI bleed/ -vasovagal -currently no neurological deficits -12/31/17 CT brain--neg -01/01/18 telemetry reviewed--short episodes of NS Vtach <10 beats -Mag 2.2 -resolved  Hypotension -Resolved withIV resuscitation and blood transfusions -decrease IVF as pt now tolerating diet  Essential hypertension -presented with hypotension -hold lisinopril and monitor -BP remains stable off meds  COPD -Currently stable. No wheezing on exam -continue Spiriva and albuterol PRN  Weakness -secondary to anemia/GI bleed -PT consulted andrecommended HH, however patient has declined -01/03/18--pt now agreeable to try PT again-->HHPT  Hypophosphatemia -repleted  Hypokalemia -repleted -add KCl to IVF   Disposition Plan: Home hopefully 10/16 or 10/17 pending GI eval Family Communication:No family present  Consultants:GI--Rehmanand general surgery--Jenkins  Code Status: FULL   DVT Prophylaxis:  SCDs   Procedures: As Listed in Progress Note Above  Antibiotics: Perioperative ertapenem   Subjective: Pt c/o odynophagia and some abd pain with eating soft diet.  C/o nausea without emesis.  No cp, sob, f/c, diarrhea.  Had BM today  Objective: Vitals:   01/06/18 1100 01/06/18 1211 01/06/18 1457 01/06/18 1655  BP:  (!) 126/52    Pulse:  (!) 101    Resp:      Temp: 99.3 F (37.4 C)   99.1 F (37.3 C)  TempSrc: Oral   Oral  SpO2:   100%   Weight:      Height:        Intake/Output Summary (Last 24 hours) at 01/06/2018 1806 Last data filed at 01/06/2018 1700 Gross per 24 hour  Intake 951.53 ml  Output 350 ml  Net 601.53 ml   Weight change:  Exam:   General:  Pt is alert, follows commands appropriately, not in acute distress  HEENT: No icterus, No thrush, No neck mass, /AT  Cardiovascular: RRR, S1/S2, no rubs, no gallops  Respiratory: CTA bilaterally, no wheezing, no crackles, no rhonchi  Abdomen: Soft/+BS, non tender, non distended, no guarding  Extremities: No edema, No lymphangitis, No petechiae, No rashes, no synovitis   Data Reviewed: I have personally reviewed following labs and imaging studies Basic Metabolic Panel: Recent Labs  Lab 01/01/18 0120 01/02/18 0830 01/03/18 0438 01/04/18 0422 01/05/18 0424 01/06/18 0354  NA 139 139 139 141 138 136  K 4.1 3.8 3.6 3.6 3.3* 3.5  CL 110 114* 111 112* 108 109  CO2 24 20* 22 26 23 23   GLUCOSE 108* 129* 120* 102* 124* 119*  BUN 17 12 7* 5* 7* 9  CREATININE 1.00 0.80 0.80 0.83 0.69 0.72  CALCIUM 7.9* 6.8* 7.6* 8.1* 7.9* 7.5*  MG 2.2 1.9 2.0 2.0 2.2  --   PHOS  --  2.9 1.8* 2.9 2.8  --    Liver Function Tests: Recent Labs  Lab 01/06/18 0354  AST 22  ALT 34  ALKPHOS 35*  BILITOT 0.8  PROT 4.9*  ALBUMIN 2.4*   No results for input(s): LIPASE, AMYLASE in the last 168 hours. No results for input(s): AMMONIA in the last 168 hours. Coagulation Profile: Recent Labs  Lab 01/02/18 0830  INR  1.19   CBC: Recent Labs  Lab 01/01/18 0608  01/02/18 0830 01/03/18 0438 01/04/18 0422 01/05/18 0424 01/06/18 0354  WBC 13.0*  --  19.0* 14.7* 12.0* 12.0* 5.2  NEUTROABS 8.3*  --   --   --   --   --   --   HGB 7.6*   < > 9.0* 8.3* 8.2* 8.9* 8.3*  HCT 24.2*   < > 27.5* 25.6* 26.4* 28.3* 26.7*  MCV 93.1  --  92.3 94.8 95.0 91.6 92.7  PLT 171  --  96* 119* 145* 189 189   < > = values in this interval not displayed.   Cardiac Enzymes: No results for input(s): CKTOTAL, CKMB, CKMBINDEX, TROPONINI in the last 168 hours. BNP: Invalid input(s): POCBNP CBG: No results for input(s): GLUCAP in the last 168 hours. HbA1C: No results for input(s): HGBA1C in the last 72 hours. Urine analysis:    Component Value Date/Time   COLORURINE YELLOW 05/09/2017 1705   APPEARANCEUR HAZY (A) 05/09/2017 1705   LABSPEC 1.017 05/09/2017 1705   PHURINE 5.0 05/09/2017 1705   GLUCOSEU NEGATIVE 05/09/2017 1705   HGBUR MODERATE (A) 05/09/2017 1705   BILIRUBINUR NEGATIVE 05/09/2017 1705   KETONESUR NEGATIVE 05/09/2017 1705   PROTEINUR NEGATIVE 05/09/2017 1705   UROBILINOGEN 0.2 09/13/2010 0850   NITRITE NEGATIVE 05/09/2017 1705   LEUKOCYTESUR SMALL (A) 05/09/2017 1705   Sepsis Labs: @LABRCNTIP (procalcitonin:4,lacticidven:4) ) Recent Results (from the past 240 hour(s))  MRSA PCR Screening     Status: None   Collection Time: 12/28/17 11:36 PM  Result Value Ref Range Status   MRSA by PCR NEGATIVE NEGATIVE Final    Comment:        The GeneXpert MRSA Assay (FDA approved for NASAL specimens only), is one component of a comprehensive MRSA colonization surveillance program. It is not intended to diagnose MRSA infection nor to guide or monitor treatment for MRSA infections. Performed at [facility], [address], [place], [place] [address]      Scheduled Meds: . pantoprazole  40 mg Oral Q1200   Continuous Infusions: . 0.9 % NaCl with KCl 20 mEq / L 10 mL/hr at 01/06/18 1417     Procedures/Studies: Ct Head Wo Contrast  Result Date: 12/31/2017 CLINICAL DATA:  Head trauma. Syncope. Impact to the back of the head. EXAM: CT HEAD WITHOUT CONTRAST TECHNIQUE: Contiguous axial images were obtained from the base of the skull through the vertex without intravenous contrast. COMPARISON:  Head CT 11/03/2007 FINDINGS: Brain: There is no mass, hemorrhage or extra-axial collection. The size and configuration of the ventricles and extra-axial CSF spaces are normal. There is no acute or chronic infarction. There is hypoattenuation of the periventricular white matter, most commonly indicating chronic ischemic microangiopathy. Vascular: No abnormal hyperdensity of the major intracranial arteries or dural venous sinuses. No intracranial atherosclerosis. Skull: The visualized skull base, calvarium and extracranial soft tissues are normal. Sinuses/Orbits: No fluid levels or advanced mucosal thickening of the visualized paranasal sinuses. No mastoid or middle ear effusion. The orbits are normal. 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 Electronically Signed   By: Ulyses Jarred M.D.   On: 12/31/2017 01:45   Nm Gi Blood Loss  Result Date: 12/31/2017 CLINICAL DATA:  Recurrent GI bleeding EXAM: NUCLEAR MEDICINE GASTROINTESTINAL BLEEDING SCAN TECHNIQUE: Sequential abdominal images were obtained following intravenous administration of Tc-9m labeled red blood cells. RADIOPHARMACEUTICALS:  22.5 mCi Tc-58m pertechnetate in-vitro labeled red cells. COMPARISON:  CT abdomen and pelvis 02/05/2017 FINDINGS: Normal blood pool distribution of tracer. Minimal de labeled tracer within the bladder. No abnormal gastrointestinal localization of tracer identified to suggest active GI bleeding. IMPRESSION: Negative GI bleeding scan. Electronically Signed   By: Lavonia Dana M.D.   On: 12/31/2017 14:55    Orson Eva, DO  [facility] Pager [phone]  If 7PM-7AM,  please contact night-coverage [email or web address] Password [email or web address] 01/06/2018, 6:06 PM   LOS: 8 days

## 2018-01-06 NOTE — Progress Notes (Signed)
5 Days Post-Op  Subjective: Patient has no complaints.  Tolerating full liquid diet well.  Objective: Vital signs in last 24 hours: Temp:  [98.3 F (36.8 C)-99.8 F (37.7 C)] 98.8 F (37.1 C) (10/15 0803) Pulse Rate:  [109-112] 109 (10/15 0500) Resp:  [18-20] 20 (10/15 0500) BP: (128-144)/(73-74) 144/74 (10/15 0500) SpO2:  [97 %-100 %] 100 % (10/15 0500) Weight:  [112.5 kg] 112.5 kg (10/15 0500) Last BM Date: 01/05/18  Intake/Output from previous day: 10/14 0701 - 10/15 0700 In: 2008.6 [I.V.:1748.7; IV Piggyback:259.9] Out: 500 [Urine:500] Intake/Output this shift: Total I/O In: -  Out: 200 [Urine:200]  General appearance: alert, cooperative and no distress GI: soft, non-tender; bowel sounds normal; no masses,  no organomegaly and Incision healing well  Lab Results:  Recent Labs    01/05/18 0424 01/06/18 0354  WBC 12.0* 5.2  HGB 8.9* 8.3*  HCT 28.3* 26.7*  PLT 189 189   BMET Recent Labs    01/05/18 0424 01/06/18 0354  NA 138 136  K 3.3* 3.5  CL 108 109  CO2 23 23  GLUCOSE 124* 119*  BUN 7* 9  CREATININE 0.69 0.72  CALCIUM 7.9* 7.5*   PT/INR No results for input(s): LABPROT, INR in the last 72 hours.  Studies/Results: No results found.  Anti-infectives: Anti-infectives (From admission, onward)   Start     Dose/Rate Route Frequency Ordered Stop   01/01/18 1715  ertapenem (INVANZ) 1,000 mg in sodium chloride 0.9 % 100 mL IVPB     1 g 200 mL/hr over 30 Minutes Intravenous  Once 01/01/18 1714 01/01/18 1751      Assessment/Plan: s/p Procedure(s): PARTIAL COLECTOMY Impression: Patient is progressing well.  Hemoglobin stable.  Will advance to soft diet.  LOS: 8 days    Aviva Signs 01/06/2018

## 2018-01-06 NOTE — Progress Notes (Signed)
Physical Therapy Treatment Patient Details Name: Randy Russo MRN: [id number] DOB: [birth year] Today's Date: 01/06/2018    History of Present Illness Randy Russo is a 77 y.o. male s/p Right hemicolectomy 01/01/18, with a medical history of hypertension, COPD, who presented to the emergency department with complaints of almost passing out as well as a GI bleed.  It seems that patient had gotten up this morning to use the bathroom and had a bowel movement.  He was unable to get up from the commode, had fallen backwards at which point his eyes rolled back into his head and he had some shaking.  Was reported by patient's wife.  Patient does not recall these events.  Patient's wife does not note any bloody bowel movement.  Per EMS patient had a large bloody bowel movement.  Patient does feel weak and dizzy at this time.  Denies current chest pain, shortness of breath, abdominal pain, nausea or vomiting, constipation, problems with urination, numbness or tingling, recent illness or travel.    PT Comments    Pt stated he is feeling good today, no reports of pain.  Pt supine in bed and willing to participate with therapy today.   Pt independent with bed mobilty just increased time to complete.  Min guard with sit to stand and SBA with gait training.  Used RW to ambulate with, pt able to increased distance to 90 feet without rest breaks required.  EOS pt left in chair with call bell within reach.  Pt was a little limited by SOB following gait, O2 saturation at 100%.  Pt mainly limited by fatigue this session.     Follow Up Recommendations  Home health PT;Supervision - Intermittent     Equipment Recommendations  Rolling walker with 5" wheels    Recommendations for Other Services       Precautions / Restrictions Precautions Precautions: Fall Restrictions Weight Bearing Restrictions: No    Mobility  Bed Mobility Overal bed mobility: Modified Independent             General bed  mobility comments: Pt able to complete independent bed mobility wiht increased time to achieve  Transfers Overall transfer level: Needs assistance Equipment used: Rolling walker (2 wheeled) Transfers: Sit to/from Stand Sit to Stand: Min guard         General transfer comment: slow labored movement  Ambulation/Gait Ambulation/Gait assistance: Supervision Gait Distance (Feet): 90 Feet Assistive device: Rolling walker (2 wheeled) Gait Pattern/deviations: Decreased step length - right;Decreased step length - left;Decreased stride length Gait velocity: decreased   General Gait Details: slow slightly labored cadence, limited mostly due to fatigue    Stairs             Wheelchair Mobility    Modified Rankin (Stroke Patients Only)       Balance                                            Cognition Arousal/Alertness: Awake/alert Behavior During Therapy: WFL for tasks assessed/performed Overall Cognitive Status: Within Functional Limits for tasks assessed                                        Exercises      General Comments  Pertinent Vitals/Pain Pain Assessment: 0-10    Home Living                      Prior Function            PT Goals (current goals can now be found in the care plan section)      Frequency    Min 3X/week      PT Plan Current plan remains appropriate    Co-evaluation              AM-PAC PT "6 Clicks" Daily Activity  Outcome Measure  Difficulty turning over in bed (including adjusting bedclothes, sheets and blankets)?: None Difficulty moving from lying on back to sitting on the side of the bed? : None Difficulty sitting down on and standing up from a chair with arms (e.g., wheelchair, bedside commode, etc,.)?: A Little Help needed moving to and from a bed to chair (including a wheelchair)?: A Little Help needed walking in hospital room?: A Little Help needed climbing  3-5 steps with a railing? : A Little 6 Click Score: 20    End of Session Equipment Utilized During Treatment: Gait belt Activity Tolerance: Patient tolerated treatment well;Patient limited by fatigue Patient left: in chair;with call bell/phone within reach(RN aware of status) Nurse Communication: Mobility status PT Visit Diagnosis: Unsteadiness on feet (R26.81);Other abnormalities of gait and mobility (R26.89);Muscle weakness (generalized) (M62.81)     Time: 1415-1440 PT Time Calculation (min) (ACUTE ONLY): 25 min  Charges:  $Therapeutic Activity: 23-37 mins                     [id number] LPTA; CBIS [phone]   Aldona Lento 01/06/2018, 3:00 PM

## 2018-01-07 DIAGNOSIS — R112 Nausea with vomiting, unspecified: Secondary | ICD-10-CM

## 2018-01-07 DIAGNOSIS — R1110 Vomiting, unspecified: Secondary | ICD-10-CM

## 2018-01-07 DIAGNOSIS — K219 Gastro-esophageal reflux disease without esophagitis: Secondary | ICD-10-CM

## 2018-01-07 DIAGNOSIS — R1310 Dysphagia, unspecified: Secondary | ICD-10-CM

## 2018-01-07 LAB — CBC
HEMATOCRIT: 29 % — AB (ref 39.0–52.0)
Hemoglobin: 9.1 g/dL — ABNORMAL LOW (ref 13.0–17.0)
MCH: 29.1 pg (ref 26.0–34.0)
MCHC: 31.4 g/dL (ref 30.0–36.0)
MCV: 92.7 fL (ref 80.0–100.0)
NRBC: 0.7 % — AB (ref 0.0–0.2)
PLATELETS: 242 10*3/uL (ref 150–400)
RBC: 3.13 MIL/uL — ABNORMAL LOW (ref 4.22–5.81)
RDW: 15.5 % (ref 11.5–15.5)
WBC: 5.6 10*3/uL (ref 4.0–10.5)

## 2018-01-07 LAB — BASIC METABOLIC PANEL
Anion gap: 8 (ref 5–15)
BUN: 10 mg/dL (ref 8–23)
CALCIUM: 7.7 mg/dL — AB (ref 8.9–10.3)
CHLORIDE: 106 mmol/L (ref 98–111)
CO2: 23 mmol/L (ref 22–32)
CREATININE: 0.76 mg/dL (ref 0.61–1.24)
GFR calc Af Amer: >60 mL/min (ref >60)
GFR calc non Af Amer: >60 mL/min (ref >60)
GLUCOSE: 129 mg/dL — AB (ref 70–99)
Potassium: 4.2 mmol/L (ref 3.5–5.1)
Sodium: 137 mmol/L (ref 135–145)

## 2018-01-07 MED ORDER — SUCRALFATE 1 GM/10ML PO SUSP
1.0000 g | Freq: Three times a day (TID) | ORAL | Status: DC
  Administered 2018-01-07 – 2018-01-08 (×3): 1 g via ORAL
  Filled 2018-01-07 (×3): qty 10

## 2018-01-07 MED ORDER — ONDANSETRON 4 MG PO TBDP
4.0000 mg | ORAL_TABLET | Freq: Three times a day (TID) | ORAL | Status: DC
  Administered 2018-01-07 – 2018-01-08 (×3): 4 mg via ORAL
  Filled 2018-01-07 (×3): qty 1

## 2018-01-07 MED ORDER — PANTOPRAZOLE SODIUM 40 MG PO TBEC
40.0000 mg | DELAYED_RELEASE_TABLET | Freq: Two times a day (BID) | ORAL | Status: DC
  Administered 2018-01-07 – 2018-01-08 (×2): 40 mg via ORAL
  Filled 2018-01-07 (×2): qty 1

## 2018-01-07 NOTE — Progress Notes (Signed)
Subjective:  Patient reports painful swallowing since his surgery. Hurts to swallow solid foods. Tolerated eggs for breakfast. Nursing staff reports no difficulty eating this morning. He complains of nausea, had vomiting yesterday. Scared he's going to vomit. BM yesterday.   Objective: Vital signs in last 24 hours: Temp:  [99.1 F (37.3 C)-99.8 F (37.7 C)] 99.8 F (37.7 C) (10/16 1523) Pulse Rate:  [89] 89 (10/16 1523) Resp:  [20-38] 20 (10/16 1523) BP: (110-155)/(60-84) 110/84 (10/16 1523) SpO2:  [98 %-100 %] 98 % (10/16 1523) Weight:  [109.4 kg] 109.4 kg (10/16 0231) Last BM Date: 01/06/18 General:   Alert,  Well-developed, well-nourished, pleasant and cooperative in NAD Head:  Normocephalic and atraumatic. Eyes:  Sclera clear, no icterus.  Abdomen:  Soft, incisional tenderness and nondistended. Incision clear without drainage. Normal bowel sounds, without guarding, and without rebound.   Extremities:  Without clubbing, deformity or edema. Neurologic:  Alert and  oriented x4;  grossly normal neurologically.  Intake/Output from previous day: 10/15 0701 - 10/16 0700 In: 549 [I.V.:549] Out: 650 [Urine:650] Intake/Output this shift: Total I/O In: 200.8 [P.O.:120; I.V.:80.8] Out: 100 [Urine:100]  Lab Results: CBC Recent Labs    01/05/18 0424 01/06/18 0354 01/07/18 0415  WBC 12.0* 5.2 5.6  HGB 8.9* 8.3* 9.1*  HCT 28.3* 26.7* 29.0*  MCV 91.6 92.7 92.7  PLT 189 189 242   BMET Recent Labs    01/05/18 0424 01/06/18 0354 01/07/18 0415  NA 138 136 137  K 3.3* 3.5 4.2  CL 108 109 106  CO2 23 23 23   GLUCOSE 124* 119* 129*  BUN 7* 9 10  CREATININE 0.69 0.72 0.76  CALCIUM 7.9* 7.5* 7.7*   LFTs Recent Labs    01/06/18 0354  BILITOT 0.8  ALKPHOS 35*  AST 22  ALT 34  PROT 4.9*  ALBUMIN 2.4*   No results for input(s): LIPASE in the last 72 hours. PT/INR No results for input(s): LABPROT, INR in the last 72 hours.    Imaging Studies: Ct Head Wo  Contrast  Result Date: 12/31/2017 CLINICAL DATA:  Head trauma. Syncope. Impact to the back of the head. EXAM: CT HEAD WITHOUT CONTRAST TECHNIQUE: Contiguous axial images were obtained from the base of the skull through the vertex without intravenous contrast. COMPARISON:  Head CT 11/03/2007 FINDINGS: Brain: There is no mass, hemorrhage or extra-axial collection. The size and configuration of the ventricles and extra-axial CSF spaces are normal. There is no acute or chronic infarction. There is hypoattenuation of the periventricular white matter, most commonly indicating chronic ischemic microangiopathy. Vascular: No abnormal hyperdensity of the major intracranial arteries or dural venous sinuses. No intracranial atherosclerosis. Skull: The visualized skull base, calvarium and extracranial soft tissues are normal. Sinuses/Orbits: No fluid levels or advanced mucosal thickening of the visualized paranasal sinuses. No mastoid or middle ear effusion. The orbits are normal. 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 Electronically Signed   By: Ulyses Jarred M.D.   On: 12/31/2017 01:45   Nm Gi Blood Loss  Result Date: 12/31/2017 CLINICAL DATA:  Recurrent GI bleeding EXAM: NUCLEAR MEDICINE GASTROINTESTINAL BLEEDING SCAN TECHNIQUE: Sequential abdominal images were obtained following intravenous administration of Tc-57m labeled red blood cells. RADIOPHARMACEUTICALS:  22.5 mCi Tc-33m pertechnetate in-vitro labeled red cells. COMPARISON:  CT abdomen and pelvis 02/05/2017 FINDINGS: Normal blood pool distribution of tracer. Minimal de labeled tracer within the bladder. No abnormal gastrointestinal localization of tracer identified to suggest active GI bleeding. IMPRESSION: Negative GI bleeding scan. Electronically Signed  By: Lavonia Dana M.D.   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  He underwent a right hemicolectomy on October 10.  We were reconsulted regarding painful swallowing which is new since surgery.  He had an EGD with normal esophagus.  He had nonbleeding erosive gastropathy on exam.  Feels more nausea with solid foods.   Recent EGD was reassuring.  Doubt we are dealing with Candida esophagitis or viral esophagitis.  Provide supportive measures for now.  Plan: 1. Increase pantoprazole to 40 mg twice daily. 2. Continue soft foods. 3. Zofran 4 mg before meals for 6 doses. 4. Carafate 1 g QAC/QHS for 10 days.   Laureen Ochs. Bernarda Caffey [facility] [phone] 10/16/20194:34 PM     LOS: 9 days

## 2018-01-07 NOTE — Progress Notes (Signed)
PROGRESS NOTE  Randy Russo:[id number] DOB: 1940-03-28 DOA: 12/27/2017 PCP: Sharilyn Sites, MD   Brief History: Randy Diss Wheeleris a 77 y.o.malewith a medical history ofhypertension, COPD, who presented to the emergency department with complaints of almost passing out as well as a GI bleed. It seems that patient had gotten up this morning to use the bathroom and had a bowel movement. He was unable to get up from the commode, had fallen backwards at which point his eyes rolled back into his head and he had some shaking. Was reported by patient's wife. Patient does not recall these events. Patient's wife does not note any bloody bowel movement. Per EMS patient had a large bloody bowel movement. Patient feltweak and dizzy. Denies current chest pain, shortness of breath, abdominal pain, nausea or vomiting, constipation, problems with urination, numbness or tingling, recent illness or travel.  Interim history Admitted for near syncope secondary to GI Bleed. GI consulted, s/p colonoscopy.During the hospitalization, pt had 2 episodes of syncope which were likely vagally mediated. He continued to have hematochezia after the colonoscopy. Patient was given IVF as well as 5u PRBCsfor the admission. NM tagged RBC scan wasnegative. In am 01/01/18, pt had another 3 bloody BMs, and 2 more units of PRBCs ordered. General surgery consulted to assist with management. He ultimately required right hemicolectomy on 01/01/18. He received 3 additional units PRBC in perioperative period, making a total of 10 units PRBC for the admission.  Postoperatively, he has had BMs with small amounts of blood, but his Hgb has remained largely stable.  Assessment/Plan: LowerGI bleed/ Hematochezia/ Diverticular bleed/Symptomatic anemia/ Anemia of acute blood loss -presented with hypotension, near syncopal event -Hemoglobin on admission 11.1.  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 None with stigmata of bleed. Small external hemorrhoids. -Overnight on 10/6-7, patient continued to have several episodes of painless hematochezia as well as a syncopal event -overnight 10/8-9--pt had another syncopal episodewith hematochezia -01/01/18 AM--3 more bloody BMs with near syncope/syncope -NM RBC scan negative -Patient has been transfused10units PRBC during hospitalization -01/01/18--colonoscopy--blood in entire colon; active bleed from diverticula @ R-hepatic flexure with bleeding site not located -general surgery consultappreciated-->right hemicolectomy 01/01/18 -diet advancement per general surgery -10/13 and 10/14--+BM with small amount blood; Hgb stable -01/06/18--c/o odynophagia; +BM without blood -10/16 feeling better, will follow GI rec's, advance further det and adjust PPI. Ok to discharge from general surgery stand point. -most likely home in am.  Odynophagia -reconsult GI -reports improvement -per GI start carafate for 10 days and use PRN antiemetics -PPI also increase to BID.  Syncope -likely secondary to GI bleed/ -vasovagal -currently no neurological deficits -12/31/17 CT brain--neg -01/01/18 telemetry reviewed--short episodes of NS Vtach <10 beats -Mag 2.2 -resolved  Hypotension -Resolved withIV resuscitation and blood transfusions -decrease/discontinue IVF  Essential hypertension -presented with hypotension -hold lisinopril and monitor -BP remains stable off meds; follow VS  COPD -Currently stable. No wheezing on exam -continue Spiriva and albuterol PRN  Weakness -secondary to anemia/GI bleed -PT consulted andrecommended [facility] services -patient agree with [facility] services treatment.  Hypophosphatemia -repleted -diet now advance.  Hypokalemia -repleted -follow BMET to reevaluate electrolytes.     Disposition Plan: Home hopefully 10/17 Family Communication:No family present  Consultants:GI--Rehmanand general surgery--[facility]kins  Code Status: FULL   DVT Prophylaxis: SCDs   Procedures: As Listed in Progress Note Above  Antibiotics: Perioperative ertapenem  Subjective: No fever, no further signs of acute bleeding. Tolerating soft diet. No abd pain.  Objective: Vitals:   01/07/18 0809 01/07/18 1121 01/07/18 1523 01/07/18 2022  BP:   110/84   Pulse:   89   Resp: (!) 30  20   Temp: 99.6 F (37.6 C) 99.8 F (37.7 C) 99.8 F (37.7 C) 99.5 F (37.5 C)  TempSrc: Oral Oral Oral Oral  SpO2:   98%   Weight:      Height:        Intake/Output Summary (Last 24 hours) at 01/07/2018 2300 Last data filed at 01/07/2018 1900 Gross per 24 hour  Intake 369.93 ml  Output 200 ml  Net 169.93 ml   Weight change: -3.1 kg Exam: General exam: Alert, awake, oriented x 3; in no major distress. Reports feeling slightly SOB with exertion. No abd pain, tolerating soft diet. Respiratory system: Clear to auscultation. Respiratory effort normal. Cardiovascular system:RRR. No murmurs, rubs, gallops. Gastrointestinal system: Abdomen is nondistended, soft and nontender. No organomegaly or masses felt. Normal bowel sounds heard. Central nervous system: Alert and oriented. No focal neurological deficits. Extremities: No Cyanosis; trace edema bilaterally. Skin: No rashes, lesions or ulcers Psychiatry: Judgement and insight appear normal. Mood & affect appropriate.    Data Reviewed: I have personally reviewed following labs and imaging studies Basic Metabolic Panel: Recent Labs  Lab 01/01/18 0120 01/02/18 0830 01/03/18 0438 01/04/18 0422 01/05/18 0424 01/06/18 0354 01/07/18 0415  NA 139 139 139 141 138 136 137  K 4.1 3.8 3.6 3.6 3.3* 3.5 4.2  CL 110 114* 111 112* 108 109 106  CO2 24 20* 22 26 23 23 23   GLUCOSE 108* 129* 120* 102* 124* 119* 129*  BUN 17 12 7* 5*  7* 9 10  CREATININE 1.00 0.80 0.80 0.83 0.69 0.72 0.76  CALCIUM 7.9* 6.8* 7.6* 8.1* 7.9* 7.5* 7.7*  MG 2.2 1.9 2.0 2.0 2.2  --   --   PHOS  --  2.9 1.8* 2.9 2.8  --   --    Liver Function Tests: Recent Labs  Lab 01/06/18 0354  AST 22  ALT 34  ALKPHOS 35*  BILITOT 0.8  PROT 4.9*  ALBUMIN 2.4*   Coagulation Profile: Recent Labs  Lab 01/02/18 0830  INR 1.19   CBC: Recent Labs  Lab 01/01/18 0608  01/03/18 0438 01/04/18 0422 01/05/18 0424 01/06/18 0354 01/07/18 0415  WBC 13.0*   < > 14.7* 12.0* 12.0* 5.2 5.6  NEUTROABS 8.3*  --   --   --   --   --   --   HGB 7.6*   < > 8.3* 8.2* 8.9* 8.3* 9.1*  HCT 24.2*   < > 25.6* 26.4* 28.3* 26.7* 29.0*  MCV 93.1   < > 94.8 95.0 91.6 92.7 92.7  PLT 171   < > 119* 145* 189 189 242   < > = values in this interval not displayed.    Urine analysis:    Component Value Date/Time   COLORURINE YELLOW 05/09/2017 1705   APPEARANCEUR HAZY (A) 05/09/2017 1705   LABSPEC 1.017 05/09/2017 1705   PHURINE 5.0 05/09/2017 1705   GLUCOSEU NEGATIVE 05/09/2017 1705   HGBUR MODERATE (A) 05/09/2017 1705   BILIRUBINUR NEGATIVE 05/09/2017 1705   KETONESUR NEGATIVE 05/09/2017 1705   PROTEINUR NEGATIVE 05/09/2017 1705   UROBILINOGEN 0.2 09/13/2010 0850   NITRITE NEGATIVE 05/09/2017 1705   LEUKOCYTESUR SMALL (A) 05/09/2017 1705    Recent Results (from the past 240  hour(s))  MRSA PCR Screening     Status: None   Collection Time: 12/28/17 11:36 PM  Result Value Ref Range Status   MRSA by PCR NEGATIVE NEGATIVE Final    Comment:        The GeneXpert MRSA Assay (FDA approved for NASAL specimens only), is one component of a comprehensive MRSA colonization surveillance program. It is not intended to diagnose MRSA infection nor to guide or monitor treatment for MRSA infections. Performed at [facility], [address] [place], [place] [address]      Scheduled Meds: . ondansetron  4 mg Oral TID AC & HS  . pantoprazole  40 mg Oral BID AC  .  sucralfate  1 g Oral TID WC & HS   Continuous Infusions: . 0.9 % NaCl with KCl 20 mEq / L 10 mL/hr at 01/07/18 1900    Procedures/Studies: Ct Head Wo Contrast  Result Date: 12/31/2017 CLINICAL DATA:  Head trauma. Syncope. Impact to the back of the head. EXAM: CT HEAD WITHOUT CONTRAST TECHNIQUE: Contiguous axial images were obtained from the base of the skull through the vertex without intravenous contrast. COMPARISON:  Head CT 11/03/2007 FINDINGS: Brain: There is no mass, hemorrhage or extra-axial collection. The size and configuration of the ventricles and extra-axial CSF spaces are normal. There is no acute or chronic infarction. There is hypoattenuation of the periventricular white matter, most commonly indicating chronic ischemic microangiopathy. Vascular: No abnormal hyperdensity of the major intracranial arteries or dural venous sinuses. No intracranial atherosclerosis. Skull: The visualized skull base, calvarium and extracranial soft tissues are normal. Sinuses/Orbits: No fluid levels or advanced mucosal thickening of the visualized paranasal sinuses. No mastoid or middle ear effusion. The orbits are normal. 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 Electronically Signed   By: Ulyses Jarred M.D.   On: 12/31/2017 01:45   Nm Gi Blood Loss  Result Date: 12/31/2017 CLINICAL DATA:  Recurrent GI bleeding EXAM: NUCLEAR MEDICINE GASTROINTESTINAL BLEEDING SCAN TECHNIQUE: Sequential abdominal images were obtained following intravenous administration of Tc-32m labeled red blood cells. RADIOPHARMACEUTICALS:  22.5 mCi Tc-73m pertechnetate in-vitro labeled red cells. COMPARISON:  CT abdomen and pelvis 02/05/2017 FINDINGS: Normal blood pool distribution of tracer. Minimal de labeled tracer within the bladder. No abnormal gastrointestinal localization of tracer identified to suggest active GI bleeding. IMPRESSION: Negative GI bleeding scan. Electronically Signed   By:  Lavonia Dana M.D.   On: 12/31/2017 14:55    Barton Dubois, MD  [facility] Pager [phone]  If 7PM-7AM, please contact night-coverage [email or web address] Password [email or web address] 01/07/2018, 11:00 PM   LOS: 9 days

## 2018-01-07 NOTE — Progress Notes (Signed)
6 Days Post-Op  Subjective: Patient sitting up in chair.  States he likes his soft diet.  Objective: Vital signs in last 24 hours: Temp:  [99.1 F (37.3 C)-99.6 F (37.6 C)] 99.6 F (37.6 C) (10/16 0809) Pulse Rate:  [101] 101 (10/15 1211) Resp:  [23-38] 30 (10/16 0809) BP: (126-155)/(52-82) 149/60 (10/16 0500) SpO2:  [99 %-100 %] 100 % (10/16 0600) Weight:  [109.4 kg] 109.4 kg (10/16 0231) Last BM Date: 01/06/18  Intake/Output from previous day: 10/15 0701 - 10/16 0700 In: 549 [I.V.:549] Out: 650 [Urine:650] Intake/Output this shift: Total I/O In: 184.5 [P.O.:120; I.V.:64.5] Out: -   General appearance: alert, cooperative and no distress GI: soft, non-tender; bowel sounds normal; no masses,  no organomegaly and Incision healing well.  Lab Results:  Recent Labs    01/06/18 0354 01/07/18 0415  WBC 5.2 5.6  HGB 8.3* 9.1*  HCT 26.7* 29.0*  PLT 189 242   BMET Recent Labs    01/06/18 0354 01/07/18 0415  NA 136 137  K 3.5 4.2  CL 109 106  CO2 23 23  GLUCOSE 119* 129*  BUN 9 10  CREATININE 0.72 0.76  CALCIUM 7.5* 7.7*   PT/INR No results for input(s): LABPROT, INR in the last 72 hours.  Studies/Results: No results found.  Anti-infectives: Anti-infectives (From admission, onward)   Start     Dose/Rate Route Frequency Ordered Stop   01/01/18 1715  ertapenem (INVANZ) 1,000 mg in sodium chloride 0.9 % 100 mL IVPB     1 g 200 mL/hr over 30 Minutes Intravenous  Once 01/01/18 1714 01/01/18 1751      Assessment/Plan: s/p Procedure(s): PARTIAL COLECTOMY Impression: Stable from surgical standpoint.  He apparently has had some dysphasia, but his EGD during this hospitalization was unremarkable.  Hematocrit has been stable.  May be advanced to regular diet as tolerated.  I will be going out of town starting tomorrow.  I will see him in my office in 1 week for follow-up.  LOS: 9 days    Aviva Signs 01/07/2018

## 2018-01-07 NOTE — Progress Notes (Signed)
Physical Therapy Treatment Patient Details Name: Randy Russo MRN: [id number] DOB: 01-12-1941 Today's Date: 01/07/2018    History of Present Illness Randy Russo is a 77 y.o. male s/p Right hemicolectomy 01/01/18, with a medical history of hypertension, COPD, who presented to the emergency department with complaints of almost passing out as well as a GI bleed.  It seems that patient had gotten up this morning to use the bathroom and had a bowel movement.  He was unable to get up from the commode, had fallen backwards at which point his eyes rolled back into his head and he had some shaking.  Was reported by patient's wife.  Patient does not recall these events.  Patient's wife does not note any bloody bowel movement.  Per EMS patient had a large bloody bowel movement.  Patient does feel weak and dizzy at this time.  Denies current chest pain, shortness of breath, abdominal pain, nausea or vomiting, constipation, problems with urination, numbness or tingling, recent illness or travel.    PT Comments    Patient able to ambulate 90 feet with RW and min guard before needing to sit down and rest. After his short rest period, patient stood to walk again but became incontinent. Patient was wheeled back to his room and therapy session ended. Nursing was notified and PT assisted with toileting and clean up.  Patient would continue to benefit from skilled physical therapy in current environment and next venue to continue return to prior function and increase strength, endurance, balance, coordination, and functional mobility and gait skills.     Follow Up Recommendations  Home health PT;Supervision - Intermittent     Equipment Recommendations  Rolling walker with 5" wheels    Recommendations for Other Services       Precautions / Restrictions Precautions Precautions: Fall Restrictions Weight Bearing Restrictions: No    Mobility  Bed Mobility               General bed mobility  comments: Patient received in recliner and in recliner at EOS  Transfers Overall transfer level: Needs assistance Equipment used: Rolling walker (2 wheeled) Transfers: Sit to/from [facility] Sit to Stand: Min guard Stand pivot transfers: Min guard       General transfer comment: labored movement, a bit impulsive with fatigue  Ambulation/Gait Ambulation/Gait assistance: Min guard Gait Distance (Feet): 90 Feet Assistive device: Rolling walker (2 wheeled) Gait Pattern/deviations: Decreased step length - right;Decreased step length - left;Decreased stride length;Trunk flexed Gait velocity: decreased   General Gait Details: slow slightly labored cadence, limited mostly due to fatigue    Stairs             Wheelchair Mobility    Modified Rankin (Stroke Patients Only)       Balance Overall balance assessment: Needs assistance Sitting-balance support: Feet supported;Bilateral upper extremity supported Sitting balance-Leahy Scale: Good     Standing balance support: During functional activity;Bilateral upper extremity supported Standing balance-Leahy Scale: Fair Standing balance comment: fair/poor without AD, fair using RW                            Cognition Arousal/Alertness: Awake/alert Behavior During Therapy: WFL for tasks assessed/performed Overall Cognitive Status: Within Functional Limits for tasks assessed  Exercises      General Comments        Pertinent Vitals/Pain Pain Assessment: Faces Faces Pain Scale: Hurts little more Pain Location: left side of abdomen at surgical site Pain Descriptors / Indicators: Sore;Discomfort Pain Intervention(s): Limited activity within patient's tolerance;Monitored during session    Home Living                      Prior Function            PT Goals (current goals can now be found in the care plan section) Acute Rehab  PT Goals Patient Stated Goal: return home PT Goal Formulation: With patient/family Time For Goal Achievement: 01/12/18 Potential to Achieve Goals: Good Progress towards PT goals: Progressing toward goals    Frequency    Min 3X/week      PT Plan Current plan remains appropriate    Co-evaluation              AM-PAC PT "6 Clicks" Daily Activity  Outcome Measure  Difficulty turning over in bed (including adjusting bedclothes, sheets and blankets)?: None Difficulty moving from lying on back to sitting on the side of the bed? : None Difficulty sitting down on and standing up from a chair with arms (e.g., wheelchair, bedside commode, etc,.)?: A Little Help needed moving to and from a bed to chair (including a wheelchair)?: A Little Help needed walking in hospital room?: A Little Help needed climbing 3-5 steps with a railing? : A Little 6 Click Score: 20    End of Session Equipment Utilized During Treatment: Gait belt Activity Tolerance: Patient tolerated treatment well;Patient limited by fatigue Patient left: in chair;with call bell/phone within reach;with nursing/sitter in room(RN aware of status) Nurse Communication: Mobility status PT Visit Diagnosis: Unsteadiness on feet (R26.81);Other abnormalities of gait and mobility (R26.89);Muscle weakness (generalized) (M62.81)     Time: 9150-5697 PT Time Calculation (min) (ACUTE ONLY): 28 min  Charges:  $Gait Training: 8-22 mins $Therapeutic Activity: 8-22 mins                     Floria Raveling. Hartnett-Rands, MS, PT Per [place] #[id number] 01/07/2018, 12:38 PM

## 2018-01-08 MED ORDER — SUCRALFATE 1 GM/10ML PO SUSP: 1.0000 g | Freq: Three times a day (TID) | ORAL | 0 refills | Status: DC

## 2018-01-08 MED ORDER — ASPIRIN EC 81 MG PO TBEC: 81.0000 mg | DELAYED_RELEASE_TABLET | Freq: Every day | ORAL | 1 refills | Status: DC

## 2018-01-08 MED ORDER — LISINOPRIL 20 MG PO TABS: 10.0000 mg | ORAL_TABLET | Freq: Every day | ORAL | Status: DC

## 2018-01-08 MED ORDER — PANTOPRAZOLE SODIUM 40 MG PO TBEC: 40.0000 mg | DELAYED_RELEASE_TABLET | Freq: Two times a day (BID) | ORAL | 1 refills | Status: DC

## 2018-01-08 NOTE — Discharge Summary (Signed)
Physician Discharge Summary  Randy Russo LFY:[id number] DOB: [birth year] DOA: 12/27/2017  PCP: Sharilyn Sites, MD  Admit date: 12/27/2017 Discharge date: 01/08/2018  Time spent: 35 minutes  Recommendations for Outpatient Follow-up:  1. Repeat VS and adjust antihypertensive regimen as needed 2. 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 Discharge home with [place] [facility] services and instruction to follow up with PCP and general surgery as an outpatient.   Diet recommendation: soft diet (heart healthy).  Filed Weights   01/06/18 0500 01/07/18 0231 01/08/18 0537  Weight: 112.5 kg 109.4 kg 110.1 kg    History of present illness:  As per H&P written by Dr. Ree Kida on 12/27/17 77 y.o. male with a medical history of hypertension, COPD, who presented to the emergency department with complaints of almost passing out as well as a GI bleed.  It seems that patient had gotten up this morning to use the bathroom and had a bowel movement.  He was unable to get up from the commode, had fallen backwards at which point his eyes rolled back into his head and he had some shaking.  Was reported by patient's wife.  Patient does not recall these events.  Patient's wife does not note any bloody bowel movement.  Per EMS patient had a large bloody bowel movement.  Patient does feel weak and dizzy at this time.  Denies current chest pain, shortness of breath, abdominal pain, nausea or vomiting, constipation, problems with urination, numbness or tingling, recent illness or travel.  ED  Course: Presented with near syncope and hypotension.  Patient was given IV fluids.  FOBT was positive.  GI was called.,  TRH called for admission.  Hospital Course:  LowerGI bleed/ Hematochezia/ Diverticular bleed/Symptomatic anemia/ Anemia of acute blood loss -presented with hypotension, near syncopal event -Hemoglobin on admission 11.1.  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 None with stigmata of bleed. Small external hemorrhoids. -Overnight on 10/6-7, patient continued to have several episodes of painless hematochezia as well as a syncopal event -overnight 10/8-9--pt had another syncopal episodewith hematochezia -01/01/18 AM--3 more bloody BMs with near syncope/syncope -NM RBC scan negative -Patient has been transfused10units PRBC during hospitalization -01/01/18--colonoscopy--blood in entire colon; active bleed from diverticula @ R-hepatic flexure with bleeding site not located -general surgery consultappreciated-->right hemicolectomy 01/01/18 -diet advancement per general surgery -10/13 and 10/14--+BM withsmall amountblood; Hgb stable -01/06/18--c/o odynophagia; +BM without blood -10/16 feeling better, will follow GI rec's, advance further det and adjust PPI. Ok to discharge from general surgery stand point. -Hgb at discharge 9.1  Odynophagia -appreciate GI consultation and assistance. -reports improvement and no complaints of further odynophagia currently -per GI start carafate for 10 days and use PPI BID.  Syncope -likely secondary to GI bleed/ -vasovagal -currently no neurological deficits -12/31/17 CT brain--neg -Mag 2.2 -resolved and back to baseline at discharge  Hypotension -Resolved withIV resuscitation and blood transfusions -patient advise to maintain adequate hydration.  Essential  hypertension -presented with hypotension -BP stable and rising; will  start half dose in the upcoming 5 days. -reassess BP at follow up visit and adjust antihypertensive regimen as needed.  COPD -Currently stable. No wheezing on exam -continue Spiriva and albuterol PRN  Weakness -Secondary to anemia/GI bleed -PT consulted andrecommended [facility] services -patient agree with [facility] services treatment at discharge.  Hypophosphatemia -repleted -diet now advanced and so far well tolerated. -will recommend repeat PO4 level at follow up visit.  Hypokalemia -repleted -follow BMET to reevaluate electrolytes.   Procedures:  Right hemicolectomy   EGD and colonoscopy   Consultations:  GI  General surgery   Discharge Exam: Vitals:   01/08/18 0353 01/08/18 0400  BP:  102/67  Pulse: 78   Resp:    Temp: 99.1 F (37.3 C)   SpO2: 98%    General exam: Alert, awake, oriented x 3; in no major distress. Reports no SOB this morning. No abd pain, tolerating soft diet. Respiratory system: Clear to auscultation. Respiratory effort normal. Cardiovascular system:RRR. No murmurs, rubs, gallops. Gastrointestinal system: Abdomen is nondistended, soft and nontender. No organomegaly or masses felt. Normal bowel sounds heard. Central nervous system: Alert and oriented. No focal neurological deficits. Extremities: No Cyanosis; trace edema bilaterally. Skin: No rashes, lesions or ulcers Psychiatry: Judgement and insight appear normal. Mood & affect appropriate.    Discharge Instructions   Discharge Instructions    Diet - low sodium heart healthy   Complete by:  As directed    Discharge instructions   Complete by:  As directed    Take medications as prescribed  Keep yourself well hydrated Follow soft diet Follow up with general surgery as instructed  Avoid the use of NSAID's. Follow up with GI service as instructed. Arrange follow up with PCP in 10 days.     Allergies as of 01/08/2018    No Known Allergies     Medication List    TAKE these medications   albuterol (2.5 MG/3ML) 0.083% nebulizer solution Commonly known as:  PROVENTIL Take 2.5 mg by nebulization every 6 (six) hours as needed for wheezing or shortness of breath.   aspirin EC 81 MG tablet Take 1 tablet (81 mg total) by mouth daily. Start taking on:  01/16/2018 What changed:    medication strength  how much to take  These instructions start on 01/16/2018. If you are unsure what to do until then, ask your doctor or other care provider.   cholecalciferol 400 units Tabs tablet Commonly known as:  VITAMIN D Take 400 Units by mouth.   lisinopril 20 MG tablet Commonly known as:  PRINIVIL,ZESTRIL Take 0.5 tablets (10 mg total) by mouth daily. Start taking on:  01/12/2018 What changed:    how much to take  These instructions start on 01/12/2018. If you are unsure what to do until then, ask your doctor or other care provider.   pantoprazole 40 MG tablet Commonly known as:  PROTONIX Take 1 tablet (40 mg total) by mouth 2 (two) times daily before a meal.   sucralfate 1 GM/10ML suspension Commonly known as:  CARAFATE Take 10 mLs (1 g total) by mouth 4 (four) times daily -  with meals and at bedtime for 10 days.   tiotropium 18 MCG inhalation capsule Commonly known as:  SPIRIVA Place 18 mcg into inhaler and inhale daily.      No Known Allergies Follow-up Information    Aviva Signs, MD. Schedule an appointment as soon as possible for a visit on 01/15/2018.   Specialty:  General Surgery Contact information:  [address] [facility] [address] [phone]        Sharilyn Sites, MD. Schedule an appointment as soon as possible for a visit in 10 day(s).   Specialty:  Family Medicine Contact information: [address] [place] [address] [phone]           The results of significant diagnostics from this hospitalization (including imaging, microbiology, ancillary  and laboratory) are listed below for reference.    Significant Diagnostic Studies: Ct Head Wo Contrast  Result Date: 12/31/2017 CLINICAL DATA:  Head trauma. Syncope. Impact to the back of the head. EXAM: CT HEAD WITHOUT CONTRAST TECHNIQUE: Contiguous axial images were obtained from the base of the skull through the vertex without intravenous contrast. COMPARISON:  Head CT 11/03/2007 FINDINGS: Brain: There is no mass, hemorrhage or extra-axial collection. The size and configuration of the ventricles and extra-axial CSF spaces are normal. There is no acute or chronic infarction. There is hypoattenuation of the periventricular white matter, most commonly indicating chronic ischemic microangiopathy. Vascular: No abnormal hyperdensity of the major intracranial arteries or dural venous sinuses. No intracranial atherosclerosis. Skull: The visualized skull base, calvarium and extracranial soft tissues are normal. Sinuses/Orbits: No fluid levels or advanced mucosal thickening of the visualized paranasal sinuses. No mastoid or middle ear effusion. The orbits are normal. 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 Electronically Signed   By: Ulyses Jarred M.D.   On: 12/31/2017 01:45   Nm Gi Blood Loss  Result Date: 12/31/2017 CLINICAL DATA:  Recurrent GI bleeding EXAM: NUCLEAR MEDICINE GASTROINTESTINAL BLEEDING SCAN TECHNIQUE: Sequential abdominal images were obtained following intravenous administration of Tc-48m labeled red blood cells. RADIOPHARMACEUTICALS:  22.5 mCi Tc-55m pertechnetate in-vitro labeled red cells. COMPARISON:  CT abdomen and pelvis 02/05/2017 FINDINGS: Normal blood pool distribution of tracer. Minimal de labeled tracer within the bladder. No abnormal gastrointestinal localization of tracer identified to suggest active GI bleeding. IMPRESSION: Negative GI bleeding scan. Electronically Signed   By: Lavonia Dana M.D.   On: 12/31/2017 14:55   Labs: Basic  Metabolic Panel: Recent Labs  Lab 01/02/18 0830 01/03/18 0438 01/04/18 0422 01/05/18 0424 01/06/18 0354 01/07/18 0415  NA 139 139 141 138 136 137  K 3.8 3.6 3.6 3.3* 3.5 4.2  CL 114* 111 112* 108 109 106  CO2 20* 22 26 23 23 23   GLUCOSE 129* 120* 102* 124* 119* 129*  BUN 12 7* 5* 7* 9 10  CREATININE 0.80 0.80 0.83 0.69 0.72 0.76  CALCIUM 6.8* 7.6* 8.1* 7.9* 7.5* 7.7*  MG 1.9 2.0 2.0 2.2  --   --   PHOS 2.9 1.8* 2.9 2.8  --   --    Liver Function Tests: Recent Labs  Lab 01/06/18 0354  AST 22  ALT 34  ALKPHOS 35*  BILITOT 0.8  PROT 4.9*  ALBUMIN 2.4*   CBC: Recent Labs  Lab 01/03/18 0438 01/04/18 0422 01/05/18 0424 01/06/18 0354 01/07/18 0415  WBC 14.7* 12.0* 12.0* 5.2 5.6  HGB 8.3* 8.2* 8.9* 8.3* 9.1*  HCT 25.6* 26.4* 28.3* 26.7* 29.0*  MCV 94.8 95.0 91.6 92.7 92.7  PLT 119* 145* 189 189 242   Signed:  Barton Dubois MD.  [facility] 01/08/2018, 8:58 AM

## 2018-01-08 NOTE — Care Management Note (Signed)
Case Management Note  Patient Details  Name: Randy Russo MRN: [id number] Date of Birth: September 07, 1940  Subjective/Objective:                    Action/Plan: Discharging home today in care of wife. Vaughan Basta of [facility] aware.   Expected Discharge Date:  01/08/18               Expected Discharge Plan:  Home/Self Care  In-House Referral:     Discharge planning Services  CM Consult  Post Acute Care Choice:  Durable Medical Equipment, Home Health Choice offered to:  Patient  DME Arranged:  Walker rolling DME Agency:  [place]:  Patient Refused [place] Agency:     Status of Service:  Completed, signed off  If discussed at [place] of Stay Meetings, dates discussed:    Additional Comments:  Yuya Vanwingerden, Chauncey Reading, RN 01/08/2018, 10:49 AM

## 2018-01-09 DIAGNOSIS — D62 Acute posthemorrhagic anemia: Secondary | ICD-10-CM | POA: Diagnosis not present

## 2018-01-09 DIAGNOSIS — M1990 Unspecified osteoarthritis, unspecified site: Secondary | ICD-10-CM | POA: Diagnosis not present

## 2018-01-09 DIAGNOSIS — R1310 Dysphagia, unspecified: Secondary | ICD-10-CM | POA: Diagnosis not present

## 2018-01-09 DIAGNOSIS — Z48815 Encounter for surgical aftercare following surgery on the digestive system: Secondary | ICD-10-CM | POA: Diagnosis not present

## 2018-01-09 DIAGNOSIS — J449 Chronic obstructive pulmonary disease, unspecified: Secondary | ICD-10-CM | POA: Diagnosis not present

## 2018-01-09 DIAGNOSIS — I10 Essential (primary) hypertension: Secondary | ICD-10-CM | POA: Diagnosis not present

## 2018-01-09 DIAGNOSIS — K219 Gastro-esophageal reflux disease without esophagitis: Secondary | ICD-10-CM | POA: Diagnosis not present

## 2018-01-09 DIAGNOSIS — K5731 Diverticulosis of large intestine without perforation or abscess with bleeding: Secondary | ICD-10-CM | POA: Diagnosis not present

## 2018-01-09 DIAGNOSIS — F17290 Nicotine dependence, other tobacco product, uncomplicated: Secondary | ICD-10-CM | POA: Diagnosis not present

## 2018-01-13 DIAGNOSIS — D62 Acute posthemorrhagic anemia: Secondary | ICD-10-CM | POA: Diagnosis not present

## 2018-01-13 DIAGNOSIS — Z48815 Encounter for surgical aftercare following surgery on the digestive system: Secondary | ICD-10-CM | POA: Diagnosis not present

## 2018-01-13 DIAGNOSIS — R1310 Dysphagia, unspecified: Secondary | ICD-10-CM | POA: Diagnosis not present

## 2018-01-13 DIAGNOSIS — K219 Gastro-esophageal reflux disease without esophagitis: Secondary | ICD-10-CM | POA: Diagnosis not present

## 2018-01-13 DIAGNOSIS — I10 Essential (primary) hypertension: Secondary | ICD-10-CM | POA: Diagnosis not present

## 2018-01-13 DIAGNOSIS — K5731 Diverticulosis of large intestine without perforation or abscess with bleeding: Secondary | ICD-10-CM | POA: Diagnosis not present

## 2018-01-13 DIAGNOSIS — J449 Chronic obstructive pulmonary disease, unspecified: Secondary | ICD-10-CM | POA: Diagnosis not present

## 2018-01-13 DIAGNOSIS — M1990 Unspecified osteoarthritis, unspecified site: Secondary | ICD-10-CM | POA: Diagnosis not present

## 2018-01-13 DIAGNOSIS — F17290 Nicotine dependence, other tobacco product, uncomplicated: Secondary | ICD-10-CM | POA: Diagnosis not present

## 2018-01-14 DIAGNOSIS — Z6833 Body mass index (BMI) 33.0-33.9, adult: Secondary | ICD-10-CM | POA: Diagnosis not present

## 2018-01-14 DIAGNOSIS — E6609 Other obesity due to excess calories: Secondary | ICD-10-CM | POA: Diagnosis not present

## 2018-01-14 DIAGNOSIS — I471 Supraventricular tachycardia: Secondary | ICD-10-CM | POA: Diagnosis not present

## 2018-01-14 DIAGNOSIS — K922 Gastrointestinal hemorrhage, unspecified: Secondary | ICD-10-CM | POA: Diagnosis not present

## 2018-01-14 DIAGNOSIS — J449 Chronic obstructive pulmonary disease, unspecified: Secondary | ICD-10-CM | POA: Diagnosis not present

## 2018-01-15 ENCOUNTER — Encounter: Payer: Self-pay | Admitting: General Surgery

## 2018-01-15 ENCOUNTER — Ambulatory Visit (INDEPENDENT_AMBULATORY_CARE_PROVIDER_SITE_OTHER): Payer: Self-pay | Admitting: General Surgery

## 2018-01-15 VITALS — BP 128/62 | HR 75 | Temp 99.1°F | Resp 20 | Wt 228.4 lb

## 2018-01-15 DIAGNOSIS — K219 Gastro-esophageal reflux disease without esophagitis: Secondary | ICD-10-CM | POA: Diagnosis not present

## 2018-01-15 DIAGNOSIS — Z09 Encounter for follow-up examination after completed treatment for conditions other than malignant neoplasm: Secondary | ICD-10-CM

## 2018-01-15 DIAGNOSIS — F17290 Nicotine dependence, other tobacco product, uncomplicated: Secondary | ICD-10-CM | POA: Diagnosis not present

## 2018-01-15 DIAGNOSIS — Z48815 Encounter for surgical aftercare following surgery on the digestive system: Secondary | ICD-10-CM | POA: Diagnosis not present

## 2018-01-15 DIAGNOSIS — J449 Chronic obstructive pulmonary disease, unspecified: Secondary | ICD-10-CM | POA: Diagnosis not present

## 2018-01-15 DIAGNOSIS — I10 Essential (primary) hypertension: Secondary | ICD-10-CM | POA: Diagnosis not present

## 2018-01-15 DIAGNOSIS — M1990 Unspecified osteoarthritis, unspecified site: Secondary | ICD-10-CM | POA: Diagnosis not present

## 2018-01-15 DIAGNOSIS — D62 Acute posthemorrhagic anemia: Secondary | ICD-10-CM | POA: Diagnosis not present

## 2018-01-15 DIAGNOSIS — R1310 Dysphagia, unspecified: Secondary | ICD-10-CM | POA: Diagnosis not present

## 2018-01-15 DIAGNOSIS — K5731 Diverticulosis of large intestine without perforation or abscess with bleeding: Secondary | ICD-10-CM | POA: Diagnosis not present

## 2018-01-15 NOTE — Progress Notes (Signed)
Subjective:     Randy Russo  Status post emergent right hemicolectomy for GI bleeding.  Patient doing very well.  No further bloody bowel movements noted.  No nausea or vomiting has been noted.  No fever or chills have been noted. Objective:    BP 128/62 (BP Location: Left Arm, Patient Position: Sitting, Cuff Size: Normal)   Pulse 75   Temp 99.1 F (37.3 C) (Temporal)   Resp 20   Wt 228 lb 6.4 oz (103.6 kg)   BMI 30.98 kg/m   General:  alert, cooperative and no distress  Abdomen soft, incision healing well.  Staples removed, Steri-Strips applied. Final pathology consistent with diagnosis.     Assessment:    Doing well postoperatively.    Plan:   No heavy lifting for the next several weeks.  Follow-up as needed.

## 2018-01-20 DIAGNOSIS — J449 Chronic obstructive pulmonary disease, unspecified: Secondary | ICD-10-CM | POA: Diagnosis not present

## 2018-01-20 DIAGNOSIS — R1310 Dysphagia, unspecified: Secondary | ICD-10-CM | POA: Diagnosis not present

## 2018-01-20 DIAGNOSIS — K5731 Diverticulosis of large intestine without perforation or abscess with bleeding: Secondary | ICD-10-CM | POA: Diagnosis not present

## 2018-01-20 DIAGNOSIS — K219 Gastro-esophageal reflux disease without esophagitis: Secondary | ICD-10-CM | POA: Diagnosis not present

## 2018-01-20 DIAGNOSIS — F17290 Nicotine dependence, other tobacco product, uncomplicated: Secondary | ICD-10-CM | POA: Diagnosis not present

## 2018-01-20 DIAGNOSIS — Z48815 Encounter for surgical aftercare following surgery on the digestive system: Secondary | ICD-10-CM | POA: Diagnosis not present

## 2018-01-20 DIAGNOSIS — I10 Essential (primary) hypertension: Secondary | ICD-10-CM | POA: Diagnosis not present

## 2018-01-20 DIAGNOSIS — D62 Acute posthemorrhagic anemia: Secondary | ICD-10-CM | POA: Diagnosis not present

## 2018-01-20 DIAGNOSIS — M1990 Unspecified osteoarthritis, unspecified site: Secondary | ICD-10-CM | POA: Diagnosis not present

## 2018-01-21 DIAGNOSIS — K219 Gastro-esophageal reflux disease without esophagitis: Secondary | ICD-10-CM | POA: Diagnosis not present

## 2018-01-21 DIAGNOSIS — J449 Chronic obstructive pulmonary disease, unspecified: Secondary | ICD-10-CM | POA: Diagnosis not present

## 2018-01-21 DIAGNOSIS — D62 Acute posthemorrhagic anemia: Secondary | ICD-10-CM | POA: Diagnosis not present

## 2018-01-21 DIAGNOSIS — M1990 Unspecified osteoarthritis, unspecified site: Secondary | ICD-10-CM | POA: Diagnosis not present

## 2018-01-21 DIAGNOSIS — K5731 Diverticulosis of large intestine without perforation or abscess with bleeding: Secondary | ICD-10-CM | POA: Diagnosis not present

## 2018-01-21 DIAGNOSIS — Z48815 Encounter for surgical aftercare following surgery on the digestive system: Secondary | ICD-10-CM | POA: Diagnosis not present

## 2018-01-21 DIAGNOSIS — F17290 Nicotine dependence, other tobacco product, uncomplicated: Secondary | ICD-10-CM | POA: Diagnosis not present

## 2018-01-21 DIAGNOSIS — I10 Essential (primary) hypertension: Secondary | ICD-10-CM | POA: Diagnosis not present

## 2018-01-21 DIAGNOSIS — R1310 Dysphagia, unspecified: Secondary | ICD-10-CM | POA: Diagnosis not present

## 2018-01-22 ENCOUNTER — Ambulatory Visit (INDEPENDENT_AMBULATORY_CARE_PROVIDER_SITE_OTHER): Payer: Self-pay | Admitting: General Surgery

## 2018-01-22 ENCOUNTER — Encounter: Payer: Self-pay | Admitting: General Surgery

## 2018-01-22 VITALS — BP 121/78 | HR 86 | Temp 97.8°F | Resp 18 | Wt 219.2 lb

## 2018-01-22 DIAGNOSIS — Z09 Encounter for follow-up examination after completed treatment for conditions other than malignant neoplasm: Secondary | ICD-10-CM

## 2018-01-22 NOTE — Progress Notes (Signed)
Subjective:     Randy Russo  Patient doing well.  Denies any bloody bowel movements.  Has a small scab along the upper portion of the incision.  No purulent drainage noted. Objective:    BP 121/78 (BP Location: Left Arm, Patient Position: Sitting, Cuff Size: Large)   Pulse 86   Temp 97.8 F (36.6 C) (Temporal)   Resp 18   Wt 219 lb 3.2 oz (99.4 kg)   BMI 29.73 kg/m   General:  alert, cooperative and no distress  Abdomen soft.  Small superficial scab with separation in upper portion of the incision.  No fluctuance or purulent drainage noted.  No erythema noted.     Assessment:    Doing well postoperatively.    Plan:   Keep wound clean and dry.  May apply triple antibiotic ointment as needed.  Follow-up as needed.

## 2018-01-23 DIAGNOSIS — I10 Essential (primary) hypertension: Secondary | ICD-10-CM | POA: Diagnosis not present

## 2018-01-23 DIAGNOSIS — K5731 Diverticulosis of large intestine without perforation or abscess with bleeding: Secondary | ICD-10-CM | POA: Diagnosis not present

## 2018-01-23 DIAGNOSIS — R1310 Dysphagia, unspecified: Secondary | ICD-10-CM | POA: Diagnosis not present

## 2018-01-23 DIAGNOSIS — Z48815 Encounter for surgical aftercare following surgery on the digestive system: Secondary | ICD-10-CM | POA: Diagnosis not present

## 2018-01-23 DIAGNOSIS — J449 Chronic obstructive pulmonary disease, unspecified: Secondary | ICD-10-CM | POA: Diagnosis not present

## 2018-01-23 DIAGNOSIS — K219 Gastro-esophageal reflux disease without esophagitis: Secondary | ICD-10-CM | POA: Diagnosis not present

## 2018-01-23 DIAGNOSIS — F17290 Nicotine dependence, other tobacco product, uncomplicated: Secondary | ICD-10-CM | POA: Diagnosis not present

## 2018-01-23 DIAGNOSIS — M1990 Unspecified osteoarthritis, unspecified site: Secondary | ICD-10-CM | POA: Diagnosis not present

## 2018-01-23 DIAGNOSIS — D62 Acute posthemorrhagic anemia: Secondary | ICD-10-CM | POA: Diagnosis not present

## 2018-01-26 DIAGNOSIS — R1310 Dysphagia, unspecified: Secondary | ICD-10-CM | POA: Diagnosis not present

## 2018-01-26 DIAGNOSIS — K219 Gastro-esophageal reflux disease without esophagitis: Secondary | ICD-10-CM | POA: Diagnosis not present

## 2018-01-26 DIAGNOSIS — Z48815 Encounter for surgical aftercare following surgery on the digestive system: Secondary | ICD-10-CM | POA: Diagnosis not present

## 2018-01-26 DIAGNOSIS — K5731 Diverticulosis of large intestine without perforation or abscess with bleeding: Secondary | ICD-10-CM | POA: Diagnosis not present

## 2018-01-26 DIAGNOSIS — I10 Essential (primary) hypertension: Secondary | ICD-10-CM | POA: Diagnosis not present

## 2018-01-26 DIAGNOSIS — J449 Chronic obstructive pulmonary disease, unspecified: Secondary | ICD-10-CM | POA: Diagnosis not present

## 2018-01-26 DIAGNOSIS — M1990 Unspecified osteoarthritis, unspecified site: Secondary | ICD-10-CM | POA: Diagnosis not present

## 2018-01-26 DIAGNOSIS — D62 Acute posthemorrhagic anemia: Secondary | ICD-10-CM | POA: Diagnosis not present

## 2018-01-26 DIAGNOSIS — F17290 Nicotine dependence, other tobacco product, uncomplicated: Secondary | ICD-10-CM | POA: Diagnosis not present

## 2018-01-27 DIAGNOSIS — D62 Acute posthemorrhagic anemia: Secondary | ICD-10-CM | POA: Diagnosis not present

## 2018-01-27 DIAGNOSIS — K219 Gastro-esophageal reflux disease without esophagitis: Secondary | ICD-10-CM | POA: Diagnosis not present

## 2018-01-27 DIAGNOSIS — K5731 Diverticulosis of large intestine without perforation or abscess with bleeding: Secondary | ICD-10-CM | POA: Diagnosis not present

## 2018-01-27 DIAGNOSIS — R1310 Dysphagia, unspecified: Secondary | ICD-10-CM | POA: Diagnosis not present

## 2018-01-27 DIAGNOSIS — J449 Chronic obstructive pulmonary disease, unspecified: Secondary | ICD-10-CM | POA: Diagnosis not present

## 2018-01-27 DIAGNOSIS — F17290 Nicotine dependence, other tobacco product, uncomplicated: Secondary | ICD-10-CM | POA: Diagnosis not present

## 2018-01-27 DIAGNOSIS — M1990 Unspecified osteoarthritis, unspecified site: Secondary | ICD-10-CM | POA: Diagnosis not present

## 2018-01-27 DIAGNOSIS — Z48815 Encounter for surgical aftercare following surgery on the digestive system: Secondary | ICD-10-CM | POA: Diagnosis not present

## 2018-01-27 DIAGNOSIS — I10 Essential (primary) hypertension: Secondary | ICD-10-CM | POA: Diagnosis not present

## 2018-01-28 DIAGNOSIS — D62 Acute posthemorrhagic anemia: Secondary | ICD-10-CM | POA: Diagnosis not present

## 2018-01-28 DIAGNOSIS — J449 Chronic obstructive pulmonary disease, unspecified: Secondary | ICD-10-CM | POA: Diagnosis not present

## 2018-01-28 DIAGNOSIS — Z48815 Encounter for surgical aftercare following surgery on the digestive system: Secondary | ICD-10-CM | POA: Diagnosis not present

## 2018-01-28 DIAGNOSIS — R1310 Dysphagia, unspecified: Secondary | ICD-10-CM | POA: Diagnosis not present

## 2018-01-28 DIAGNOSIS — K219 Gastro-esophageal reflux disease without esophagitis: Secondary | ICD-10-CM | POA: Diagnosis not present

## 2018-01-28 DIAGNOSIS — I10 Essential (primary) hypertension: Secondary | ICD-10-CM | POA: Diagnosis not present

## 2018-01-28 DIAGNOSIS — K5731 Diverticulosis of large intestine without perforation or abscess with bleeding: Secondary | ICD-10-CM | POA: Diagnosis not present

## 2018-01-28 DIAGNOSIS — M1990 Unspecified osteoarthritis, unspecified site: Secondary | ICD-10-CM | POA: Diagnosis not present

## 2018-01-28 DIAGNOSIS — F17290 Nicotine dependence, other tobacco product, uncomplicated: Secondary | ICD-10-CM | POA: Diagnosis not present

## 2018-02-04 DIAGNOSIS — F17290 Nicotine dependence, other tobacco product, uncomplicated: Secondary | ICD-10-CM | POA: Diagnosis not present

## 2018-02-04 DIAGNOSIS — J449 Chronic obstructive pulmonary disease, unspecified: Secondary | ICD-10-CM | POA: Diagnosis not present

## 2018-02-04 DIAGNOSIS — I10 Essential (primary) hypertension: Secondary | ICD-10-CM | POA: Diagnosis not present

## 2018-02-04 DIAGNOSIS — K5731 Diverticulosis of large intestine without perforation or abscess with bleeding: Secondary | ICD-10-CM | POA: Diagnosis not present

## 2018-02-04 DIAGNOSIS — D62 Acute posthemorrhagic anemia: Secondary | ICD-10-CM | POA: Diagnosis not present

## 2018-02-04 DIAGNOSIS — R1310 Dysphagia, unspecified: Secondary | ICD-10-CM | POA: Diagnosis not present

## 2018-02-04 DIAGNOSIS — M1990 Unspecified osteoarthritis, unspecified site: Secondary | ICD-10-CM | POA: Diagnosis not present

## 2018-02-04 DIAGNOSIS — K219 Gastro-esophageal reflux disease without esophagitis: Secondary | ICD-10-CM | POA: Diagnosis not present

## 2018-02-04 DIAGNOSIS — Z48815 Encounter for surgical aftercare following surgery on the digestive system: Secondary | ICD-10-CM | POA: Diagnosis not present

## 2018-02-10 DIAGNOSIS — K5731 Diverticulosis of large intestine without perforation or abscess with bleeding: Secondary | ICD-10-CM | POA: Diagnosis not present

## 2018-02-10 DIAGNOSIS — I10 Essential (primary) hypertension: Secondary | ICD-10-CM | POA: Diagnosis not present

## 2018-02-10 DIAGNOSIS — R1310 Dysphagia, unspecified: Secondary | ICD-10-CM | POA: Diagnosis not present

## 2018-02-10 DIAGNOSIS — J449 Chronic obstructive pulmonary disease, unspecified: Secondary | ICD-10-CM | POA: Diagnosis not present

## 2018-02-10 DIAGNOSIS — M1990 Unspecified osteoarthritis, unspecified site: Secondary | ICD-10-CM | POA: Diagnosis not present

## 2018-02-10 DIAGNOSIS — F17290 Nicotine dependence, other tobacco product, uncomplicated: Secondary | ICD-10-CM | POA: Diagnosis not present

## 2018-02-10 DIAGNOSIS — D62 Acute posthemorrhagic anemia: Secondary | ICD-10-CM | POA: Diagnosis not present

## 2018-02-10 DIAGNOSIS — Z48815 Encounter for surgical aftercare following surgery on the digestive system: Secondary | ICD-10-CM | POA: Diagnosis not present

## 2018-02-10 DIAGNOSIS — K219 Gastro-esophageal reflux disease without esophagitis: Secondary | ICD-10-CM | POA: Diagnosis not present

## 2018-02-16 DIAGNOSIS — D62 Acute posthemorrhagic anemia: Secondary | ICD-10-CM | POA: Diagnosis not present

## 2018-02-16 DIAGNOSIS — J449 Chronic obstructive pulmonary disease, unspecified: Secondary | ICD-10-CM | POA: Diagnosis not present

## 2018-02-16 DIAGNOSIS — Z48815 Encounter for surgical aftercare following surgery on the digestive system: Secondary | ICD-10-CM | POA: Diagnosis not present

## 2018-02-16 DIAGNOSIS — F17290 Nicotine dependence, other tobacco product, uncomplicated: Secondary | ICD-10-CM | POA: Diagnosis not present

## 2018-02-16 DIAGNOSIS — I10 Essential (primary) hypertension: Secondary | ICD-10-CM | POA: Diagnosis not present

## 2018-02-16 DIAGNOSIS — R1310 Dysphagia, unspecified: Secondary | ICD-10-CM | POA: Diagnosis not present

## 2018-02-16 DIAGNOSIS — M1990 Unspecified osteoarthritis, unspecified site: Secondary | ICD-10-CM | POA: Diagnosis not present

## 2018-02-16 DIAGNOSIS — K5731 Diverticulosis of large intestine without perforation or abscess with bleeding: Secondary | ICD-10-CM | POA: Diagnosis not present

## 2018-02-16 DIAGNOSIS — K219 Gastro-esophageal reflux disease without esophagitis: Secondary | ICD-10-CM | POA: Diagnosis not present

## 2018-02-23 DIAGNOSIS — I10 Essential (primary) hypertension: Secondary | ICD-10-CM | POA: Diagnosis not present

## 2018-02-23 DIAGNOSIS — D62 Acute posthemorrhagic anemia: Secondary | ICD-10-CM | POA: Diagnosis not present

## 2018-02-23 DIAGNOSIS — K5731 Diverticulosis of large intestine without perforation or abscess with bleeding: Secondary | ICD-10-CM | POA: Diagnosis not present

## 2018-02-23 DIAGNOSIS — F17290 Nicotine dependence, other tobacco product, uncomplicated: Secondary | ICD-10-CM | POA: Diagnosis not present

## 2018-02-23 DIAGNOSIS — J449 Chronic obstructive pulmonary disease, unspecified: Secondary | ICD-10-CM | POA: Diagnosis not present

## 2018-02-23 DIAGNOSIS — R1310 Dysphagia, unspecified: Secondary | ICD-10-CM | POA: Diagnosis not present

## 2018-02-23 DIAGNOSIS — K219 Gastro-esophageal reflux disease without esophagitis: Secondary | ICD-10-CM | POA: Diagnosis not present

## 2018-02-23 DIAGNOSIS — Z48815 Encounter for surgical aftercare following surgery on the digestive system: Secondary | ICD-10-CM | POA: Diagnosis not present

## 2018-02-23 DIAGNOSIS — M1990 Unspecified osteoarthritis, unspecified site: Secondary | ICD-10-CM | POA: Diagnosis not present

## 2018-03-04 DIAGNOSIS — R1310 Dysphagia, unspecified: Secondary | ICD-10-CM | POA: Diagnosis not present

## 2018-03-04 DIAGNOSIS — I10 Essential (primary) hypertension: Secondary | ICD-10-CM | POA: Diagnosis not present

## 2018-03-04 DIAGNOSIS — Z48815 Encounter for surgical aftercare following surgery on the digestive system: Secondary | ICD-10-CM | POA: Diagnosis not present

## 2018-03-04 DIAGNOSIS — K219 Gastro-esophageal reflux disease without esophagitis: Secondary | ICD-10-CM | POA: Diagnosis not present

## 2018-03-04 DIAGNOSIS — D62 Acute posthemorrhagic anemia: Secondary | ICD-10-CM | POA: Diagnosis not present

## 2018-03-04 DIAGNOSIS — F17290 Nicotine dependence, other tobacco product, uncomplicated: Secondary | ICD-10-CM | POA: Diagnosis not present

## 2018-03-04 DIAGNOSIS — K5731 Diverticulosis of large intestine without perforation or abscess with bleeding: Secondary | ICD-10-CM | POA: Diagnosis not present

## 2018-03-04 DIAGNOSIS — J449 Chronic obstructive pulmonary disease, unspecified: Secondary | ICD-10-CM | POA: Diagnosis not present

## 2018-03-04 DIAGNOSIS — M1990 Unspecified osteoarthritis, unspecified site: Secondary | ICD-10-CM | POA: Diagnosis not present

## 2018-07-14 DIAGNOSIS — J069 Acute upper respiratory infection, unspecified: Secondary | ICD-10-CM | POA: Diagnosis not present

## 2018-07-14 DIAGNOSIS — H6120 Impacted cerumen, unspecified ear: Secondary | ICD-10-CM | POA: Diagnosis not present

## 2018-07-14 DIAGNOSIS — E6609 Other obesity due to excess calories: Secondary | ICD-10-CM | POA: Diagnosis not present

## 2018-07-14 DIAGNOSIS — Z6834 Body mass index (BMI) 34.0-34.9, adult: Secondary | ICD-10-CM | POA: Diagnosis not present

## 2018-10-02 ENCOUNTER — Encounter: Payer: Self-pay | Admitting: Nurse Practitioner

## 2018-10-02 DIAGNOSIS — R7309 Other abnormal glucose: Secondary | ICD-10-CM | POA: Diagnosis not present

## 2018-10-02 DIAGNOSIS — I10 Essential (primary) hypertension: Secondary | ICD-10-CM | POA: Diagnosis not present

## 2018-10-02 DIAGNOSIS — E6609 Other obesity due to excess calories: Secondary | ICD-10-CM | POA: Diagnosis not present

## 2018-10-02 DIAGNOSIS — Z6831 Body mass index (BMI) 31.0-31.9, adult: Secondary | ICD-10-CM | POA: Diagnosis not present

## 2018-10-02 DIAGNOSIS — Z0000 Encounter for general adult medical examination without abnormal findings: Secondary | ICD-10-CM | POA: Diagnosis not present

## 2018-10-02 DIAGNOSIS — J449 Chronic obstructive pulmonary disease, unspecified: Secondary | ICD-10-CM | POA: Diagnosis not present

## 2018-10-02 DIAGNOSIS — G4730 Sleep apnea, unspecified: Secondary | ICD-10-CM | POA: Diagnosis not present

## 2018-10-02 DIAGNOSIS — C61 Malignant neoplasm of prostate: Secondary | ICD-10-CM | POA: Diagnosis not present

## 2018-10-02 DIAGNOSIS — Z0001 Encounter for general adult medical examination with abnormal findings: Secondary | ICD-10-CM | POA: Diagnosis not present

## 2018-10-02 DIAGNOSIS — Z1389 Encounter for screening for other disorder: Secondary | ICD-10-CM | POA: Diagnosis not present

## 2018-10-28 ENCOUNTER — Encounter (INDEPENDENT_AMBULATORY_CARE_PROVIDER_SITE_OTHER): Payer: Self-pay | Admitting: Nurse Practitioner

## 2018-10-28 ENCOUNTER — Other Ambulatory Visit: Payer: Self-pay

## 2018-10-28 ENCOUNTER — Ambulatory Visit (INDEPENDENT_AMBULATORY_CARE_PROVIDER_SITE_OTHER): Payer: Medicare HMO | Admitting: Nurse Practitioner

## 2018-10-28 VITALS — BP 134/79 | HR 87 | Temp 98.4°F | Resp 18 | Ht 65.0 in | Wt 219.6 lb

## 2018-10-28 DIAGNOSIS — K5731 Diverticulosis of large intestine without perforation or abscess with bleeding: Secondary | ICD-10-CM

## 2018-10-28 NOTE — Progress Notes (Signed)
Subjective:    Patient ID: Randy Russo, male    DOB: 14-Oct-1940, 78 y.o.   MRN: [id number]  HPI  Randy Russo. Sires is a 78 y.o. male with a history of diverticular bleeding s/p right hemicolectomy 12/2017 who presents today for GI follow up as advised by his PCP. He denies having any abdominal pain. He is passing a normal formed BM daily. No constipation or diarrhea, no rectal bleeding. His daughter is present.   Hx of diverticular bleed at the hepatic flexure, required 7 units of PRBCs, s/p right hemicolectomy 01/01/2018.  Colonoscopy 12/28/2017: Diverticulosis in the sigmoid colon, at the hepatic flexure, in the ascending colon and in the cecum, external hemorrhoids. Colonic diverticular bleed.  Past Medical History:  Diagnosis Date  . Arthritis   . COPD (chronic obstructive pulmonary disease) ([place])   . Hypertension    Past Surgical History:  Procedure Laterality Date  . CATARACT EXTRACTION W/PHACO Left 11/16/2012   Procedure: CATARACT EXTRACTION PHACO AND INTRAOCULAR LENS PLACEMENT (IOC);  Surgeon: Tonny Branch, MD;  Location: [facility];  Service: Ophthalmology;  Laterality: Left;  CDE: 13.66  . CATARACT EXTRACTION W/PHACO Right 01/18/2013   Procedure: CATARACT EXTRACTION PHACO AND INTRAOCULAR LENS PLACEMENT (IOC);  Surgeon: Tonny Branch, MD;  Location: [facility];  Service: Ophthalmology;  Laterality: Right;  CDE:8.79  . CERVICAL FUSION  4 yrs ago  . COLONOSCOPY N/A 12/28/2017   Procedure: COLONOSCOPY;  Surgeon: Rogene Houston, MD;  Location: [facility];  Service: Endoscopy;  Laterality: N/A;  . COLONOSCOPY N/A 01/01/2018   Procedure: COLONOSCOPY;  Surgeon: Rogene Houston, MD;  Location: [facility];  Service: Endoscopy;  Laterality: N/A;  . ESOPHAGOGASTRODUODENOSCOPY N/A 01/01/2018   Procedure: ESOPHAGOGASTRODUODENOSCOPY (EGD);  Surgeon: Rogene Houston, MD;  Location: [facility];  Service: Endoscopy;  Laterality: N/A;  . JOINT REPLACEMENT Left 5 yrs ago   knee  . PARTIAL  COLECTOMY N/A 01/01/2018   Procedure: PARTIAL COLECTOMY;  Surgeon: Aviva Signs, MD;  Location: [facility];  Service: General;  Laterality: N/A;  . right hip replacement Right 3 yrs ago    Current Outpatient Medications on File Prior to Visit  Medication Sig Dispense Refill  . albuterol (PROVENTIL) (2.5 MG/3ML) 0.083% nebulizer solution Take 2.5 mg by nebulization every 6 (six) hours as needed for wheezing or shortness of breath.    . cholecalciferol (VITAMIN D) 400 units TABS tablet Take 400 Units by mouth daily.     . Omega-3 Fatty Acids (FISH OIL) 1200 MG CAPS Take 1,200 mg by mouth daily.    Marland Kitchen tiotropium (SPIRIVA) 18 MCG inhalation capsule Place 18 mcg into inhaler and inhale daily.     No current facility-administered medications on file prior to visit.    No Known Allergies   Review of Systems See HPI, all other systems reviewed and are negative      Objective:   Physical Exam  BP 134/79   Pulse 87   Temp 98.4 F (36.9 C) (Oral)   Resp 18   Ht 5\' 5"  (1.651 m)   Wt 219 lb 9.6 oz (99.6 kg)   BMI 36.54 kg/m   General: 78 y.o. male well developed in NAD, daughter present. Heart: RRR, no murmur Lungs: clear throughout Abdomen: large midline abdominal scar intact, soft, nontender, no masses or organomegaly Extremities: trace edema bilateral LEs Neuro: alert and oriented x 3, no focal deficits    Assessment & Plan:   1. 78 y.o. male s/p right  hemicolectomy 01/01/2018 secondary to diverticular bleed presents today for follow up -request copy of most recent CBC from PCP's office -follow up as needed

## 2018-10-28 NOTE — Patient Instructions (Signed)
Follow up in our office as needed  Call our office if you develop any abdominal pain or rectal bleeding

## 2018-11-06 ENCOUNTER — Other Ambulatory Visit: Payer: Self-pay

## 2018-11-06 ENCOUNTER — Ambulatory Visit (INDEPENDENT_AMBULATORY_CARE_PROVIDER_SITE_OTHER): Payer: Medicare HMO | Admitting: Urology

## 2018-11-06 DIAGNOSIS — N5201 Erectile dysfunction due to arterial insufficiency: Secondary | ICD-10-CM | POA: Diagnosis not present

## 2018-11-06 DIAGNOSIS — C61 Malignant neoplasm of prostate: Secondary | ICD-10-CM

## 2018-11-06 DIAGNOSIS — R9721 Rising PSA following treatment for malignant neoplasm of prostate: Secondary | ICD-10-CM | POA: Diagnosis not present

## 2018-11-08 ENCOUNTER — Observation Stay (HOSPITAL_COMMUNITY)
Admission: EM | Admit: 2018-11-08 | Discharge: 2018-11-10 | Disposition: A | Discharge status: Home / Self Care | Payer: Medicare HMO | Attending: Internal Medicine | Admitting: Internal Medicine

## 2018-11-08 ENCOUNTER — Other Ambulatory Visit: Payer: Self-pay

## 2018-11-08 ENCOUNTER — Encounter (HOSPITAL_COMMUNITY): Payer: Self-pay | Admitting: Emergency Medicine

## 2018-11-08 ENCOUNTER — Emergency Department (HOSPITAL_COMMUNITY): Payer: Medicare HMO

## 2018-11-08 DIAGNOSIS — Z20828 Contact with and (suspected) exposure to other viral communicable diseases: Secondary | ICD-10-CM | POA: Diagnosis not present

## 2018-11-08 DIAGNOSIS — D509 Iron deficiency anemia, unspecified: Secondary | ICD-10-CM | POA: Insufficient documentation

## 2018-11-08 DIAGNOSIS — F17200 Nicotine dependence, unspecified, uncomplicated: Secondary | ICD-10-CM | POA: Diagnosis not present

## 2018-11-08 DIAGNOSIS — K625 Hemorrhage of anus and rectum: Secondary | ICD-10-CM | POA: Diagnosis not present

## 2018-11-08 DIAGNOSIS — K219 Gastro-esophageal reflux disease without esophagitis: Secondary | ICD-10-CM | POA: Diagnosis not present

## 2018-11-08 DIAGNOSIS — Z209 Contact with and (suspected) exposure to unspecified communicable disease: Secondary | ICD-10-CM | POA: Diagnosis not present

## 2018-11-08 DIAGNOSIS — K529 Noninfective gastroenteritis and colitis, unspecified: Secondary | ICD-10-CM | POA: Diagnosis present

## 2018-11-08 DIAGNOSIS — I10 Essential (primary) hypertension: Secondary | ICD-10-CM | POA: Diagnosis not present

## 2018-11-08 DIAGNOSIS — J449 Chronic obstructive pulmonary disease, unspecified: Secondary | ICD-10-CM | POA: Diagnosis not present

## 2018-11-08 DIAGNOSIS — Z03818 Encounter for observation for suspected exposure to other biological agents ruled out: Secondary | ICD-10-CM | POA: Diagnosis not present

## 2018-11-08 DIAGNOSIS — R9431 Abnormal electrocardiogram [ECG] [EKG]: Secondary | ICD-10-CM | POA: Diagnosis not present

## 2018-11-08 DIAGNOSIS — K5730 Diverticulosis of large intestine without perforation or abscess without bleeding: Secondary | ICD-10-CM | POA: Diagnosis not present

## 2018-11-08 DIAGNOSIS — R000 Tachycardia, unspecified: Secondary | ICD-10-CM | POA: Diagnosis not present

## 2018-11-08 LAB — COMPREHENSIVE METABOLIC PANEL
ALT: 21 U/L (ref 0–44)
AST: 17 U/L (ref 15–41)
Albumin: 3.7 g/dL (ref 3.5–5.0)
Alkaline Phosphatase: 54 U/L (ref 38–126)
Anion gap: 8 (ref 5–15)
BUN: 24 mg/dL — ABNORMAL HIGH (ref 8–23)
CO2: 22 mmol/L (ref 22–32)
Calcium: 9.1 mg/dL (ref 8.9–10.3)
Chloride: 106 mmol/L (ref 98–111)
Creatinine, Ser: 0.85 mg/dL (ref 0.61–1.24)
GFR calc Af Amer: >60 mL/min (ref >60)
GFR calc non Af Amer: >60 mL/min (ref >60)
Glucose, Bld: 123 mg/dL — ABNORMAL HIGH (ref 70–99)
Potassium: 3.8 mmol/L (ref 3.5–5.1)
Sodium: 136 mmol/L (ref 135–145)
Total Bilirubin: 0.4 mg/dL (ref 0.3–1.2)
Total Protein: 7.1 g/dL (ref 6.5–8.1)

## 2018-11-08 LAB — CBC WITH DIFFERENTIAL/PLATELET
Abs Immature Granulocytes: 0.03 10*3/uL (ref 0.00–0.07)
Basophils Absolute: 0 10*3/uL (ref 0.0–0.1)
Basophils Relative: 1 %
Eosinophils Absolute: 0.3 10*3/uL (ref 0.0–0.5)
Eosinophils Relative: 5 %
HCT: 37.1 % — ABNORMAL LOW (ref 39.0–52.0)
Hemoglobin: 11.9 g/dL — ABNORMAL LOW (ref 13.0–17.0)
Immature Granulocytes: 0 %
Lymphocytes Relative: 13 %
Lymphs Abs: 0.9 10*3/uL (ref 0.7–4.0)
MCH: 30.3 pg (ref 26.0–34.0)
MCHC: 32.1 g/dL (ref 30.0–36.0)
MCV: 94.4 fL (ref 80.0–100.0)
Monocytes Absolute: 0.5 10*3/uL (ref 0.1–1.0)
Monocytes Relative: 7 %
Neutro Abs: 5.1 10*3/uL (ref 1.7–7.7)
Neutrophils Relative %: 74 %
Platelets: 279 10*3/uL (ref 150–400)
RBC: 3.93 MIL/uL — ABNORMAL LOW (ref 4.22–5.81)
RDW: 14.6 % (ref 11.5–15.5)
WBC: 6.8 10*3/uL (ref 4.0–10.5)
nRBC: 0 % (ref 0.0–0.2)

## 2018-11-08 LAB — SARS CORONAVIRUS 2 BY RT PCR (HOSPITAL ORDER, PERFORMED IN ~~LOC~~ HOSPITAL LAB): SARS Coronavirus 2: NEGATIVE

## 2018-11-08 LAB — TYPE AND SCREEN
ABO/RH(D): B POS
Antibody Screen: NEGATIVE

## 2018-11-08 MED ORDER — ACETAMINOPHEN 325 MG PO TABS: 650.0000 mg | ORAL_TABLET | Freq: Four times a day (QID) | ORAL | Status: DC | PRN

## 2018-11-08 MED ORDER — CIPROFLOXACIN IN D5W 400 MG/200ML IV SOLN
400.0000 mg | Freq: Once | INTRAVENOUS | Status: AC
  Administered 2018-11-08: 400 mg via INTRAVENOUS
  Filled 2018-11-08: qty 200

## 2018-11-08 MED ORDER — SODIUM CHLORIDE 0.9 % IV BOLUS
1000.0000 mL | Freq: Once | INTRAVENOUS | Status: AC
  Administered 2018-11-08: 1000 mL via INTRAVENOUS

## 2018-11-08 MED ORDER — SODIUM CHLORIDE 0.9 % IV SOLN
INTRAVENOUS | Status: AC
  Administered 2018-11-09: 02:00:00 via INTRAVENOUS

## 2018-11-08 MED ORDER — ACETAMINOPHEN 650 MG RE SUPP: 650.0000 mg | Freq: Four times a day (QID) | RECTAL | Status: DC | PRN

## 2018-11-08 MED ORDER — CIPROFLOXACIN IN D5W 400 MG/200ML IV SOLN
400.0000 mg | Freq: Two times a day (BID) | INTRAVENOUS | Status: DC
  Administered 2018-11-09 (×2): 400 mg via INTRAVENOUS
  Filled 2018-11-08 (×2): qty 200

## 2018-11-08 MED ORDER — METRONIDAZOLE IN NACL 5-0.79 MG/ML-% IV SOLN
500.0000 mg | Freq: Once | INTRAVENOUS | Status: AC
  Administered 2018-11-08: 500 mg via INTRAVENOUS
  Filled 2018-11-08: qty 100

## 2018-11-08 MED ORDER — METRONIDAZOLE IN NACL 5-0.79 MG/ML-% IV SOLN
500.0000 mg | Freq: Three times a day (TID) | INTRAVENOUS | Status: DC
  Administered 2018-11-09 – 2018-11-10 (×4): 500 mg via INTRAVENOUS
  Filled 2018-11-08 (×4): qty 100

## 2018-11-08 MED ORDER — IOHEXOL 300 MG/ML  SOLN
100.0000 mL | Freq: Once | INTRAMUSCULAR | Status: AC | PRN
  Administered 2018-11-08: 100 mL via INTRAVENOUS

## 2018-11-08 NOTE — ED Triage Notes (Signed)
Pt reports having recent colon surgery and went for office visit yesterday with no problems. This morning started having abdominal cramping with heavy rectal bleeding.

## 2018-11-08 NOTE — ED Provider Notes (Signed)
[facility] EMERGENCY DEPARTMENT Provider Note   CSN: [id number] Arrival date & time: 11/08/18  1818     History   Chief Complaint Chief Complaint  Patient presents with   Rectal Bleeding    HPI Randy Russo is a 78 y.o. male.     Patient complains of rectal bleeding.  He had 2 episodes in the emergency department.  There is bright red blood.  Patient having mild abdominal discomfort  The history is provided by the patient. No language interpreter was used.  Rectal Bleeding Quality:  Bright red Amount:  Moderate Timing:  Constant Chronicity:  New Context: not anal fissures   Similar prior episodes: no   Relieved by:  Nothing Worsened by:  Nothing Ineffective treatments:  None tried Associated symptoms: abdominal pain   Risk factors: no anticoagulant use     Past Medical History:  Diagnosis Date   Arthritis    COPD (chronic obstructive pulmonary disease) ([place])    Hypertension     Patient Active Problem List   Diagnosis Date Noted   Rectal bleeding 11/08/2018   Colitis 11/08/2018   Odynophagia    Gastroesophageal reflux disease without esophagitis    Non-intractable vomiting    Acute blood loss anemia 01/02/2018   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    Syncope and collapse    Lower GI bleed 12/27/2017   COPD (chronic obstructive pulmonary disease) ([place]) 12/27/2017   Essential hypertension 12/27/2017   Near syncope 12/27/2017    Past Surgical History:  Procedure Laterality Date   CATARACT EXTRACTION W/PHACO Left 11/16/2012   Procedure: CATARACT EXTRACTION PHACO AND INTRAOCULAR LENS PLACEMENT ([place]);  Surgeon: Tonny Branch, MD;  Location: [facility];  Service: Ophthalmology;  Laterality: Left;  CDE: 13.66   CATARACT EXTRACTION W/PHACO Right 01/18/2013   Procedure: CATARACT EXTRACTION PHACO AND INTRAOCULAR LENS PLACEMENT ([facility]);  Surgeon: Tonny Branch, MD;  Location: [facility];  Service: Ophthalmology;  Laterality: Right;   CDE:8.79   CERVICAL FUSION  4 yrs ago   COLONOSCOPY N/A 12/28/2017   Procedure: COLONOSCOPY;  Surgeon: Rogene Houston, MD;  Location: [facility];  Service: Endoscopy;  Laterality: N/A;   COLONOSCOPY N/A 01/01/2018   Procedure: COLONOSCOPY;  Surgeon: Rogene Houston, MD;  Location: [facility];  Service: Endoscopy;  Laterality: N/A;   ESOPHAGOGASTRODUODENOSCOPY N/A 01/01/2018   Procedure: ESOPHAGOGASTRODUODENOSCOPY (EGD);  Surgeon: Rogene Houston, MD;  Location: [facility];  Service: Endoscopy;  Laterality: N/A;   JOINT REPLACEMENT Left 5 yrs ago   knee   PARTIAL COLECTOMY N/A 01/01/2018   Procedure: PARTIAL COLECTOMY;  Surgeon: Aviva Signs, MD;  Location: [facility];  Service: General;  Laterality: N/A;   right hip replacement Right 3 yrs ago        Home Medications    Prior to Admission medications   Medication Sig Start Date End Date Taking? Authorizing Provider  albuterol (PROVENTIL) (2.5 MG/3ML) 0.083% nebulizer solution Take 2.5 mg by nebulization every 6 (six) hours as needed for wheezing or shortness of breath.   Yes [provider]  albuterol (VENTOLIN HFA) 108 (90 Base) MCG/ACT inhaler Inhale 1-2 puffs into the lungs every 6 (six) hours as needed for wheezing or shortness of breath.   Yes [provider]  ALLOPURINOL PO Take 1 tablet by mouth daily.   Yes [provider]  cholecalciferol (VITAMIN D) 400 units TABS tablet Take 400 Units by mouth daily.    Yes [provider]  Omega-3 Fatty Acids (FISH OIL) 1200 MG CAPS Take 1,200 mg by mouth daily.   Yes [provider]    Family History History reviewed. No pertinent family history.  Social History Social History   Tobacco Use   Smoking status: Light Tobacco Smoker    Types: Cigars   Smokeless tobacco: Never Used  Substance Use Topics   Alcohol use: No   Drug use: No     Allergies   Patient has no known allergies.   Review of Systems Review of  Systems  Constitutional: Negative for appetite change and fatigue.  HENT: Negative for congestion, ear discharge and sinus pressure.   Eyes: Negative for discharge.  Respiratory: Negative for cough.   Cardiovascular: Negative for chest pain.  Gastrointestinal: Positive for abdominal pain and hematochezia. Negative for diarrhea.       Rectal bleeding  Genitourinary: Negative for frequency and hematuria.  Musculoskeletal: Negative for back pain.  Skin: Negative for rash.  Neurological: Negative for seizures and headaches.  Psychiatric/Behavioral: Negative for hallucinations.     Physical Exam Updated Vital Signs BP 116/76    Pulse 93    Temp 98.4 F (36.9 C) (Oral)    Resp 20    Ht 5\' 6"  (1.676 m)    Wt 99.8 kg    SpO2 95%    BMI 35.51 kg/m   Physical Exam Vitals signs and nursing note reviewed.  Constitutional:      Appearance: He is well-developed.  HENT:     Head: Normocephalic.     Nose: Nose normal.  Eyes:     General: No scleral icterus.    Conjunctiva/sclera: Conjunctivae normal.  Neck:     Musculoskeletal: Neck supple.     Thyroid: No thyromegaly.  Cardiovascular:     Rate and Rhythm: Normal rate and regular rhythm.     Heart sounds: No murmur. No friction rub. No gallop.   Pulmonary:     Breath sounds: No stridor. No wheezing or rales.  Chest:     Chest wall: No tenderness.  Abdominal:     General: There is no distension.     Tenderness: There is abdominal tenderness. There is no rebound.  Musculoskeletal: Normal range of motion.  Lymphadenopathy:     Cervical: No cervical adenopathy.  Skin:    Findings: No erythema or rash.  Neurological:     Mental Status: He is oriented to person, place, and time.     Motor: No abnormal muscle tone.     Coordination: Coordination normal.  Psychiatric:        Behavior: Behavior normal.      ED Treatments / Results  Labs (all labs ordered are listed, but only abnormal results are displayed) Labs Reviewed  CBC  WITH DIFFERENTIAL/PLATELET - Abnormal; Notable for the following components:      Result Value   RBC 3.93 (*)    Hemoglobin 11.9 (*)    HCT 37.1 (*)    All other components within normal limits  COMPREHENSIVE METABOLIC PANEL - Abnormal; Notable for the following components:   Glucose, Bld 123 (*)    BUN 24 (*)    All other components within normal limits  SARS CORONAVIRUS 2 (HOSPITAL ORDER, [place] [facility])  COMPREHENSIVE METABOLIC PANEL  CBC  TYPE AND SCREEN    EKG None  Radiology Ct Abdomen Pelvis W Contrast  Result Date: 11/08/2018 CLINICAL DATA:  Abdominal distension. Heavy rectal bleeding status post recent colon surgery. EXAM:  CT ABDOMEN AND PELVIS WITH CONTRAST TECHNIQUE: Multidetector CT imaging of the abdomen and pelvis was performed using the standard protocol following bolus administration of intravenous contrast. CONTRAST:  155mL OMNIPAQUE IOHEXOL 300 MG/ML  SOLN COMPARISON:  MRI abdomen dated June 22, 2015 and CT of the pelvis dated February 05, 2017. FINDINGS: Lower chest: There is some scarring at the left lung base.The heart size is normal. Hepatobiliary: There is a cyst near the dome of the liver, similar to prior study. Normal gallbladder.There is no biliary ductal dilation. Pancreas: Normal contours without ductal dilatation. No peripancreatic fluid collection. Spleen: No splenic laceration or hematoma. Adrenals/Urinary Tract: --Adrenal glands: There is mild thickening of both adrenal glands which appears similar to prior study. --Right kidney/ureter: There is a large upper pole simple cyst. There are additional smaller cysts in the lower pole similar to prior study. There is no hydronephrosis. --Left kidney/ureter: Multiple left-sided cysts are noted without evidence for hydronephrosis. --Urinary bladder: Unremarkable. Stomach/Bowel: --Stomach/Duodenum: No hiatal hernia or other gastric abnormality. Normal duodenal course and caliber. --Small bowel: No  dilatation or inflammation. --Colon: There is severe sigmoid diverticulosis without definite CT evidence of diverticulitis. There is diffuse wall thickening of large portions of the sigmoid colon which may be secondary to the underlying diverticular disease versus early diverticulitis/colitis. There is a colonic anastomosis at the level of the transverse colon. The patient is status post prior right hemicolectomy. --Appendix: Surgically absent. Vascular/Lymphatic: Atherosclerotic calcification is present within the non-aneurysmal abdominal aorta, without hemodynamically significant stenosis. --No retroperitoneal lymphadenopathy. --No mesenteric lymphadenopathy. --No pelvic or inguinal lymphadenopathy. Reproductive: Multiple fiducial markers are noted in the prostate gland. Other: No ascites or free air. There is a fat containing periumbilical hernia. There are bilateral fat containing inguinal hernias, right greater than left. Musculoskeletal. There are degenerative changes throughout the lumbar spine. There are advanced degenerative changes of the left hip. 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 IMPRESSION: 1. Severe near pan colonic diverticulosis. There is diffuse wall thickening of the distal descending colon and sigmoid colon which may be secondary to early uncomplicated diverticulitis or infectious/inflammatory colitis. An underlying mass is not entirely excluded and should be correlated with age-appropriate screening. 2. Status post right hemicolectomy. 3. Additional chronic findings as detailed above. Electronically Signed   By: Constance Holster M.D.   On: 11/08/2018 20:20    Procedures Procedures (including critical care time)  Medications Ordered in ED Medications  ciprofloxacin (CIPRO) IVPB 400 mg (has no administration in time range)  metroNIDAZOLE (FLAGYL) IVPB 500 mg (has no administration in time range)  acetaminophen (TYLENOL) tablet 650 mg (has no administration  in time range)    Or  acetaminophen (TYLENOL) suppository 650 mg (has no administration in time range)  0.9 %  sodium chloride infusion (has no administration in time range)  sodium chloride 0.9 % bolus 1,000 mL (0 mLs Intravenous Stopped 11/08/18 2054)  iohexol (OMNIPAQUE) 300 MG/ML solution 100 mL (100 mLs Intravenous Contrast Given 11/08/18 1944)     Initial Impression / Assessment and Plan / ED Course  I have reviewed the triage vital signs and the nursing notes.  Pertinent labs & imaging results that were available during my care of the patient were reviewed by me and considered in my medical decision making (see chart for details).    CRITICAL CARE Performed by: Milton Ferguson Total critical care time:35 minutes Critical care time was exclusive of separately billable procedures and treating other patients. Critical care was  necessary to treat or prevent imminent or life-threatening deterioration. Critical care was time spent personally by me on the following activities: development of treatment plan with patient and/or surrogate as well as nursing, discussions with consultants, evaluation of patient's response to treatment, examination of patient, obtaining history from patient or surrogate, ordering and performing treatments and interventions, ordering and review of laboratory studies, ordering and review of radiographic studies, pulse oximetry and re-evaluation of patient's condition.     Patient with continued rectal bleeding.  CT scan shows possible colitis or diverticulitis.  Patient will be admitted and placed on antibiotics and will be seen by GI  Final Clinical Impressions(s) / ED Diagnoses   Final diagnoses:  Rectal bleeding    ED Discharge Orders    None       Milton Ferguson, MD 11/08/18 2108

## 2018-11-08 NOTE — H&P (Signed)
TRH H&P    Patient Demographics:    Randy Russo, is a 78 y.o. male  MRN: [id number]  DOB - 18-Jan-1941  Admit Date - 11/08/2018  Referring Randy/NP/PA:  Catalina Pizza  Outpatient Primary Randy for the patient is Sharilyn Sites, Randy  Patient coming from:  home  Chief complaint- rectal bleeding   HPI:    Randy Russo  is a 78 y.o. male, w hypertension, Copd not on home o2, h/o gout, Gerd, Diverticulosis, w hx of lower GI bleeding 12/27/2017, w colonoscopy 12/28/2017   apparently presents with c/o rectal bleeding x3 at home today.  Pt has had 2 episodes while in ER.  Pt notes blood on toilet paper and in bowel.  Slight right lower abdominal cramping at times.   Pt denies NSAID or aspirin use,  Just taking some tylenol .  Pt denies fever, chills, diarrhea, black stool, dysuria, hematuria.   In ED T 98.4, P 107 R 18, Bp 129/82  Pox 95% on RA Wt 99.8kg  CT scan abd/ pelvis IMPRESSION: 1. Severe near pan colonic diverticulosis. There is diffuse wall thickening of the distal descending colon and sigmoid colon which may be secondary to early uncomplicated diverticulitis or infectious/inflammatory colitis. An underlying mass is not entirely excluded and should be correlated with age-appropriate screening. 2. Status post right hemicolectomy. 3. Additional chronic findings as detailed above.   Na 136, K 3.8, Bun 24, Creatinine 0.85 Glucose 123 Ast 17, Alt 21 Wbc 6.8, Hgb 11.9, plt 279 Type and screen completed  Covid -19 pending  Pt will be admitted for rectal bleeding and diverticulitis/colitis.      Review of systems:    In addition to the HPI above,  No Fever-chills, No Headache, No changes with Vision or hearing, No problems swallowing food or Liquids, No Chest pain, Cough or Shortness of Breath, No Nausea or Vomiting, bowel movements are regular, No Blood in Urine, No dysuria, No new skin rashes or  bruises, No new joints pains-aches,  No new weakness, tingling, numbness in any extremity, No recent weight gain or loss, No polyuria, polydypsia or polyphagia, No significant Mental Stressors.  All other systems reviewed and are negative.    Past History of the following :    Past Medical History:  Diagnosis Date   Arthritis    COPD (chronic obstructive pulmonary disease) ([place])    Hypertension       Past Surgical History:  Procedure Laterality Date   CATARACT EXTRACTION W/PHACO Left 11/16/2012   Procedure: CATARACT EXTRACTION PHACO AND INTRAOCULAR LENS PLACEMENT ([place]);  Surgeon: Tonny Branch, Randy;  Location: [facility];  Service: Ophthalmology;  Laterality: Left;  CDE: 13.66   CATARACT EXTRACTION W/PHACO Right 01/18/2013   Procedure: CATARACT EXTRACTION PHACO AND INTRAOCULAR LENS PLACEMENT ([facility]);  Surgeon: Tonny Branch, Randy;  Location: [facility];  Service: Ophthalmology;  Laterality: Right;  CDE:8.79   CERVICAL FUSION  4 yrs ago   COLONOSCOPY N/A 12/28/2017   Procedure: COLONOSCOPY;  Surgeon: Rogene Houston, Randy;  Location: [facility];  Service:  Endoscopy;  Laterality: N/A;   COLONOSCOPY N/A 01/01/2018   Procedure: COLONOSCOPY;  Surgeon: Rogene Houston, Randy;  Location: [facility];  Service: Endoscopy;  Laterality: N/A;   ESOPHAGOGASTRODUODENOSCOPY N/A 01/01/2018   Procedure: ESOPHAGOGASTRODUODENOSCOPY ([facility]);  Surgeon: Rogene Houston, Randy;  Location[facility];  Service: Endoscopy;  Laterality: N/A;   JOINT REPLACEMENT Left 5 yrs ago   knee   PARTIAL COLECTOMY N/A 01/01/2018   Procedure: PARTIAL COLECTOMY;  Surgeon: Aviva Signs, Randy;  Location: [facility];  Service: General;  Laterality: N/A;   right hip replacement Right 3 yrs ago      Social History:      Social History   Tobacco Use   Smoking status: Light Tobacco Smoker    Types: Cigars   Smokeless tobacco: Never Used  Substance Use Topics   Alcohol use: No       Family History :     Family  History  Problem Relation Age of Onset   Diabetes Mother    Dementia Father        Home Medications:   Prior to Admission medications   Medication Sig Start Date End Date Taking? Authorizing Provider  albuterol (PROVENTIL) (2.5 MG/3ML) 0.083% nebulizer solution Take 2.5 mg by nebulization every 6 (six) hours as needed for wheezing or shortness of breath.   Yes Provider, Historical, Randy  albuterol (VENTOLIN HFA) 108 (90 Base) MCG/ACT inhaler Inhale 1-2 puffs into the lungs every 6 (six) hours as needed for wheezing or shortness of breath.   Yes Provider, Historical, Randy  ALLOPURINOL PO Take 1 tablet by mouth daily.   Yes Provider, Historical, Randy  cholecalciferol (VITAMIN D) 400 units TABS tablet Take 400 Units by mouth daily.    Yes Provider, Historical, Randy  Omega-3 Fatty Acids (FISH OIL) 1200 MG CAPS Take 1,200 mg by mouth daily.   Yes Provider, Historical, Randy     Allergies:    No Known Allergies   Physical Exam:   Vitals  Blood pressure 116/76, pulse 93, temperature 98.4 F (36.9 C), temperature source Oral, resp. rate 20, height 5\' 6"  (1.676 m), weight 99.8 kg, SpO2 95 %.  1.  General: axoxo3  2. Psychiatric: euthymic  3. Neurologic: Cn 2-12 intact, reflexes 2+ symmetric, diffuse with no clonus, motor 5/5 in all 4 ext  4. HEENMT:  Anicteric, pupils 1.49mm symmetric, direct, consensual, near intact Neck: no jvd  5. Respiratory : CTAB  6. Cardiovascular : rrr s1, s2, no m/g/r  7. Gastrointestinal:  Abd: soft, nt, nd, +bs  8. Skin:  Ext: no c/c/e,  No rash  9.Musculoskeletal:  Good ROM,  No adenopathy    Data Review:    CBC Recent Labs  Lab 11/08/18 1845  WBC 6.8  HGB 11.9*  HCT 37.1*  PLT 279  MCV 94.4  MCH 30.3  MCHC 32.1  RDW 14.6  LYMPHSABS 0.9  MONOABS 0.5  EOSABS 0.3  BASOSABS 0.0   ------------------------------------------------------------------------------------------------------------------  Results for orders placed or performed  during the hospital encounter of 11/08/18 (from the past 48 hour(s))  CBC with Differential/Platelet     Status: Abnormal   Collection Time: 11/08/18  6:45 PM  Result Value Ref Range   WBC 6.8 4.0 - 10.5 K/uL   RBC 3.93 (L) 4.22 - 5.81 MIL/uL   Hemoglobin 11.9 (L) 13.0 - 17.0 g/dL   HCT 37.1 (L) 39.0 - 52.0 %   MCV 94.4 80.0 - 100.0 fL   MCH 30.3 26.0 -  34.0 pg   MCHC 32.1 30.0 - 36.0 g/dL   RDW 14.6 11.5 - 15.5 %   Platelets 279 150 - 400 K/uL   nRBC 0.0 0.0 - 0.2 %   Neutrophils Relative % 74 %   Neutro Abs 5.1 1.7 - 7.7 K/uL   Lymphocytes Relative 13 %   Lymphs Abs 0.9 0.7 - 4.0 K/uL   Monocytes Relative 7 %   Monocytes Absolute 0.5 0.1 - 1.0 K/uL   Eosinophils Relative 5 %   Eosinophils Absolute 0.3 0.0 - 0.5 K/uL   Basophils Relative 1 %   Basophils Absolute 0.0 0.0 - 0.1 K/uL   Immature Granulocytes 0 %   Abs Immature Granulocytes 0.03 0.00 - 0.07 K/uL    Comment: Performed at [facility], [address] [place], [place] [address]  Comprehensive metabolic panel     Status: Abnormal   Collection Time: 11/08/18  6:45 PM  Result Value Ref Range   Sodium 136 135 - 145 mmol/L   Potassium 3.8 3.5 - 5.1 mmol/L   Chloride 106 98 - 111 mmol/L   CO2 22 22 - 32 mmol/L   Glucose, Bld 123 (H) 70 - 99 mg/dL   BUN 24 (H) 8 - 23 mg/dL   Creatinine, Ser 0.85 0.61 - 1.24 mg/dL   Calcium 9.1 8.9 - 10.3 mg/dL   Total Protein 7.1 6.5 - 8.1 g/dL   Albumin 3.7 3.5 - 5.0 g/dL   AST 17 15 - 41 U/L   ALT 21 0 - 44 U/L   Alkaline Phosphatase 54 38 - 126 U/L   Total Bilirubin 0.4 0.3 - 1.2 mg/dL   GFR calc non Af Amer >60 >60 mL/min   GFR calc Af Amer >60 >60 mL/min   Anion gap 8 5 - 15    Comment: Performed at [facility], [address], [place], [place] [address]  Type and screen     Status: None   Collection Time: 11/08/18  6:45 PM  Result Value Ref Range   ABO/RH(D) B POS    Antibody Screen NEG    Sample Expiration      11/11/2018,2359 Performed at [facility], [address], [place], [place] [address]     Chemistries  Recent Labs  Lab 11/08/18 1845  NA 136  K 3.8  CL 106  CO2 22  GLUCOSE 123*  BUN 24*  CREATININE 0.85  CALCIUM 9.1  AST 17  ALT 21  ALKPHOS 54  BILITOT 0.4   ------------------------------------------------------------------------------------------------------------------  ------------------------------------------------------------------------------------------------------------------ GFR: Estimated Creatinine Clearance: 79.2 mL/min (by C-G formula based on SCr of 0.85 mg/dL). Liver Function Tests: Recent Labs  Lab 11/08/18 1845  AST 17  ALT 21  ALKPHOS 54  BILITOT 0.4  PROT 7.1  ALBUMIN 3.7   No results for input(s): LIPASE, AMYLASE in the last 168 hours. No results for input(s): AMMONIA in the last 168 hours. Coagulation Profile: No results for input(s): INR, PROTIME in the last 168 hours. Cardiac Enzymes: No results for input(s): CKTOTAL, CKMB, CKMBINDEX, TROPONINI in the last 168 hours. BNP (last 3 results) No results for input(s): PROBNP in the last 8760 hours. HbA1C: No results for input(s): HGBA1C in the last 72 hours. CBG: No results for input(s): GLUCAP in the last 168 hours. Lipid Profile: No results for input(s): CHOL, HDL, LDLCALC, TRIG, CHOLHDL, LDLDIRECT in the last 72 hours. Thyroid Function Tests: No results for input(s): TSH, T4TOTAL, FREET4, T3FREE, THYROIDAB in the last 72 hours. Anemia Panel:  No results for input(s): VITAMINB12, FOLATE, FERRITIN, TIBC, IRON, RETICCTPCT in the last 72 hours.  --------------------------------------------------------------------------------------------------------------- Urine analysis:    Component Value Date/Time   COLORURINE YELLOW 05/09/2017 1705   APPEARANCEUR HAZY (A) 05/09/2017 1705   LABSPEC 1.017 05/09/2017 1705   PHURINE 5.0 05/09/2017 1705   GLUCOSEU NEGATIVE 05/09/2017 1705   HGBUR MODERATE (A) 05/09/2017 1705   BILIRUBINUR NEGATIVE 05/09/2017  1705   KETONESUR NEGATIVE 05/09/2017 1705   PROTEINUR NEGATIVE 05/09/2017 1705   UROBILINOGEN 0.2 09/13/2010 0850   NITRITE NEGATIVE 05/09/2017 1705   LEUKOCYTESUR SMALL (A) 05/09/2017 1705      Imaging Results:    Ct Abdomen Pelvis W Contrast  Result Date: 11/08/2018 CLINICAL DATA:  Abdominal distension. Heavy rectal bleeding status post recent colon surgery. EXAM: CT ABDOMEN AND PELVIS WITH CONTRAST TECHNIQUE: Multidetector CT imaging of the abdomen and pelvis was performed using the standard protocol following bolus administration of intravenous contrast. CONTRAST:  120mL OMNIPAQUE IOHEXOL 300 MG/ML  SOLN COMPARISON:  MRI abdomen dated June 22, 2015 and CT of the pelvis dated February 05, 2017. FINDINGS: Lower chest: There is some scarring at the left lung base.The heart size is normal. Hepatobiliary: There is a cyst near the dome of the liver, similar to prior study. Normal gallbladder.There is no biliary ductal dilation. Pancreas: Normal contours without ductal dilatation. No peripancreatic fluid collection. Spleen: No splenic laceration or hematoma. Adrenals/Urinary Tract: --Adrenal glands: There is mild thickening of both adrenal glands which appears similar to prior study. --Right kidney/ureter: There is a large upper pole simple cyst. There are additional smaller cysts in the lower pole similar to prior study. There is no hydronephrosis. --Left kidney/ureter: Multiple left-sided cysts are noted without evidence for hydronephrosis. --Urinary bladder: Unremarkable. Stomach/Bowel: --Stomach/Duodenum: No hiatal hernia or other gastric abnormality. Normal duodenal course and caliber. --Small bowel: No dilatation or inflammation. --Colon: There is severe sigmoid diverticulosis without definite CT evidence of diverticulitis. There is diffuse wall thickening of large portions of the sigmoid colon which may be secondary to the underlying diverticular disease versus early diverticulitis/colitis. There  is a colonic anastomosis at the level of the transverse colon. The patient is status post prior right hemicolectomy. --Appendix: Surgically absent. Vascular/Lymphatic: Atherosclerotic calcification is present within the non-aneurysmal abdominal aorta, without hemodynamically significant stenosis. --No retroperitoneal lymphadenopathy. --No mesenteric lymphadenopathy. --No pelvic or inguinal lymphadenopathy. Reproductive: Multiple fiducial markers are noted in the prostate gland. Other: No ascites or free air. There is a fat containing periumbilical hernia. There are bilateral fat containing inguinal hernias, right greater than left. Musculoskeletal. There are degenerative changes throughout the lumbar spine. There are advanced degenerative changes of the left hip. 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 IMPRESSION: 1. Severe near pan colonic diverticulosis. There is diffuse wall thickening of the distal descending colon and sigmoid colon which may be secondary to early uncomplicated diverticulitis or infectious/inflammatory colitis. An underlying mass is not entirely excluded and should be correlated with age-appropriate screening. 2. Status post right hemicolectomy. 3. Additional chronic findings as detailed above. Electronically Signed   By: Constance Holster M.D.   On: 11/08/2018 20:20       Assessment & Plan:    Principal Problem:   Rectal bleeding Active Problems:   COPD (chronic obstructive pulmonary disease) ([facility])   Essential hypertension   Gastroesophageal reflux disease without esophagitis   Colitis  Rectal bleeding NPO  GI consult placed in computer Check cbc in am  Diverticulitis/ colitis Cipro iv,  Flagyl iv Check cbc in am  H/o Gout Pt doesn't mention taking allopurinol or colchicine  Copd Cont Albuterol HFA 2puff q6h prn   DVT Prophylaxis-   SCDs   AM Labs Ordered, also please review Full Orders  Family Communication: Admission, patients condition  and plan of care including tests being ordered have been discussed with the patient who indicate understanding and agree with the plan and Code Status.  Code Status:  FULL CODE,  Notified his daughter, that pt will be admitted to [facility]  Admission status: Observation: Based on patients clinical presentation and evaluation of above clinical data, I have made determination that patient meets observation criteria at this time.     Time spent in minutes :  55  minutes   Jani Gravel M.D on 11/08/2018 at 10:00 PM

## 2018-11-09 ENCOUNTER — Other Ambulatory Visit: Payer: Self-pay

## 2018-11-09 ENCOUNTER — Encounter (HOSPITAL_COMMUNITY): Payer: Self-pay

## 2018-11-09 DIAGNOSIS — K529 Noninfective gastroenteritis and colitis, unspecified: Secondary | ICD-10-CM | POA: Diagnosis not present

## 2018-11-09 DIAGNOSIS — K625 Hemorrhage of anus and rectum: Secondary | ICD-10-CM | POA: Diagnosis not present

## 2018-11-09 DIAGNOSIS — J449 Chronic obstructive pulmonary disease, unspecified: Secondary | ICD-10-CM | POA: Diagnosis not present

## 2018-11-09 LAB — COMPREHENSIVE METABOLIC PANEL
ALT: 17 U/L (ref 0–44)
AST: 12 U/L — ABNORMAL LOW (ref 15–41)
Albumin: 3.1 g/dL — ABNORMAL LOW (ref 3.5–5.0)
Alkaline Phosphatase: 42 U/L (ref 38–126)
Anion gap: 6 (ref 5–15)
BUN: 20 mg/dL (ref 8–23)
CO2: 24 mmol/L (ref 22–32)
Calcium: 8.4 mg/dL — ABNORMAL LOW (ref 8.9–10.3)
Chloride: 111 mmol/L (ref 98–111)
Creatinine, Ser: 0.74 mg/dL (ref 0.61–1.24)
GFR calc Af Amer: >60 mL/min (ref >60)
GFR calc non Af Amer: >60 mL/min (ref >60)
Glucose, Bld: 107 mg/dL — ABNORMAL HIGH (ref 70–99)
Potassium: 3.9 mmol/L (ref 3.5–5.1)
Sodium: 141 mmol/L (ref 135–145)
Total Bilirubin: 0.7 mg/dL (ref 0.3–1.2)
Total Protein: 5.9 g/dL — ABNORMAL LOW (ref 6.5–8.1)

## 2018-11-09 LAB — CBC
HCT: 32 % — ABNORMAL LOW (ref 39.0–52.0)
HCT: 34 % — ABNORMAL LOW (ref 39.0–52.0)
Hemoglobin: 9.8 g/dL — ABNORMAL LOW (ref 13.0–17.0)
Hemoglobin: 10.4 g/dL — ABNORMAL LOW (ref 13.0–17.0)
MCH: 29.9 pg (ref 26.0–34.0)
MCH: 30.1 pg (ref 26.0–34.0)
MCHC: 30.6 g/dL (ref 30.0–36.0)
MCHC: 30.6 g/dL (ref 30.0–36.0)
MCV: 97.6 fL (ref 80.0–100.0)
MCV: 98.6 fL (ref 80.0–100.0)
Platelets: 234 10*3/uL (ref 150–400)
Platelets: 241 10*3/uL (ref 150–400)
RBC: 3.28 MIL/uL — ABNORMAL LOW (ref 4.22–5.81)
RBC: 3.45 MIL/uL — ABNORMAL LOW (ref 4.22–5.81)
RDW: 14.6 % (ref 11.5–15.5)
RDW: 14.5 % (ref 11.5–15.5)
WBC: 6.8 10*3/uL (ref 4.0–10.5)
WBC: 6.3 10*3/uL (ref 4.0–10.5)
nRBC: 0 % (ref 0.0–0.2)
nRBC: 0 % (ref 0.0–0.2)

## 2018-11-09 LAB — APTT: aPTT: 31 seconds (ref 24–36)

## 2018-11-09 LAB — PROTIME-INR
INR: 1.2 (ref 0.8–1.2)
Prothrombin Time: 15.2 seconds (ref 11.4–15.2)

## 2018-11-09 NOTE — Consult Note (Signed)
Referring Provider: Kathie Dike, MD Primary Care Physician:  Sharilyn Sites, MD Primary Gastroenterologist:  Dr. Laural Golden  Reason for Consultation:    Rectal bleeding.  HPI:   Patient is 78 year old African-American male who has history of 10 unit colonic diverticular bleed in October 2019 leading to right hemicolectomy. Patient was seen in our office by Ms.Carl Best NP on 10/28/2018 and was doing well. Patient states he had normal bowel movement yesterday morning. Around 4 PM he felt he needed to have a large bowel movement and instead passed large amount of bright red blood per rectum.  Also noted lower abdominal pain/cramping.  He did not experience nausea vomiting diaphoreses or shortness of breath.  Since patient had experienced massive GI bleed last year he has his daughter to bring him to the emergency room.  Evaluation in emergency room reveals hemoglobin of 11.9 g.  WBC was 6.8 and platelet count was 234K.  BUN was 20 and creatinine was 0.74. Abdominal pelvic CT was obtained with contrast revealing severe pancolonic diverticulosis with diffuse wall thickening in distal descending and sigmoid colon and evidence of right hemicolectomy.  He also had A atherosclerotic calcification to aorta.  Multiple cysts were noted involving both kidneys with one large cyst at upper pole of right kidney.  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 Patient reports having had 3 bowel movements before he came to emergency room and he had 1 in the ER and 1 last night but not since then.  He is passing flatus.  He is not having abdominal pain anymore.  He says he may have brought on his illness by dietary indiscretion.  He does not take OTC NSAIDs. He states he lost over 20 pounds following the surgery last year but lately has weight has leveled off.  His bowels move daily.  He is neither prone to diarrhea or constipation    Past Medical History:  Diagnosis Date  . Arthritis   . COPD  (chronic obstructive pulmonary disease) ([place])   . Hypertension         Colonic diverticulosis.  Colonic bleed in October 2019.  Past Surgical History:  Procedure Laterality Date  . CATARACT EXTRACTION W/PHACO Left 11/16/2012   Procedure: CATARACT EXTRACTION PHACO AND INTRAOCULAR LENS PLACEMENT (IOC);  Surgeon: Tonny Branch, MD;  Location: [facility];  Service: Ophthalmology;  Laterality: Left;  CDE: 13.66  . CATARACT EXTRACTION W/PHACO Right 01/18/2013   Procedure: CATARACT EXTRACTION PHACO AND INTRAOCULAR LENS PLACEMENT (IOC);  Surgeon: Tonny Branch, MD;  Location: [facility];  Service: Ophthalmology;  Laterality: Right;  CDE:8.79  . CERVICAL FUSION  4 yrs ago  . COLONOSCOPY N/A 12/28/2017   Procedure: COLONOSCOPY;  Surgeon: Rogene Houston, MD;  Location: [facility];  Service: Endoscopy;  Laterality: N/A;  . COLONOSCOPY N/A 01/01/2018   Procedure: COLONOSCOPY;  Surgeon: Rogene Houston, MD;  Location: [facility];  Service: Endoscopy;  Laterality: N/A;  . ESOPHAGOGASTRODUODENOSCOPY N/A 01/01/2018   Procedure: ESOPHAGOGASTRODUODENOSCOPY (EGD);  Surgeon: Rogene Houston, MD;  Location: [facility];  Service: Endoscopy;  Laterality: N/A;  . JOINT REPLACEMENT Left 5 yrs ago   knee  . PARTIAL COLECTOMY N/A 01/01/2018   Procedure: PARTIAL COLECTOMY;  Surgeon: Aviva Signs, MD;  Location: [facility];  Service: General;  Laterality: N/A;  . right hip replacement Right 3 yrs ago    Prior to Admission medications   Medication Sig Start Date End Date Taking? Authorizing Provider  albuterol (PROVENTIL) (2.5 MG/3ML)  0.083% nebulizer solution Take 2.5 mg by nebulization every 6 (six) hours as needed for wheezing or shortness of breath.   Yes [provider]  albuterol (VENTOLIN HFA) 108 (90 Base) MCG/ACT inhaler Inhale 1-2 puffs into the lungs every 6 (six) hours as needed for wheezing or shortness of breath.   Yes [provider]  ALLOPURINOL PO Take 1 tablet by mouth daily.   Yes  [provider]  cholecalciferol (VITAMIN D) 400 units TABS tablet Take 400 Units by mouth daily.    Yes [provider]  Omega-3 Fatty Acids (FISH OIL) 1200 MG CAPS Take 1,200 mg by mouth daily.   Yes [provider]    Current Facility-Administered Medications  Medication Dose Route Frequency Provider Last Rate Last Dose  . acetaminophen (TYLENOL) tablet 650 mg  650 mg Oral Q6H PRN Jani Gravel, MD       Or  . acetaminophen (TYLENOL) suppository 650 mg  650 mg Rectal Q6H PRN Jani Gravel, MD      . ciprofloxacin (CIPRO) IVPB 400 mg  400 mg Intravenous Q12H Jani Gravel, MD 200 mL/hr at 11/09/18 0933 400 mg at 11/09/18 0933  . metroNIDAZOLE (FLAGYL) IVPB 500 mg  500 mg Intravenous Lysle Dingwall, MD 100 mL/hr at 11/09/18 0542 500 mg at 11/09/18 0542    Allergies as of 11/08/2018  . (No Known Allergies)    Family History  Problem Relation Age of Onset  . Diabetes Mother   . Dementia Father     Social History   Socioeconomic History  . Marital status: Married    Spouse name: Not on file  . Number of children: Not on file  . Years of education: Not on file  . Highest education level: Not on file  Occupational History  . Not on file  Social Needs  . Financial resource strain: Patient refused  . Food insecurity    Worry: Patient refused    Inability: Patient refused  . Transportation needs    Medical: Patient refused    Non-medical: Patient refused  Tobacco Use  . Smoking status: Light Tobacco Smoker    Types: Cigars  . Smokeless tobacco: Never Used  Substance and Sexual Activity  . Alcohol use: No  . Drug use: No  . Sexual activity: Not Currently  Lifestyle  . Physical activity    Days per week: Patient refused    Minutes per session: Patient refused  . Stress: Not on file  Relationships  . Social Herbalist on phone: Patient refused    Gets together: Patient refused    Attends religious service: Patient refused    Active member  of club or organization: Patient refused    Attends meetings of clubs or organizations: Patient refused    Relationship status: Patient refused  . Intimate partner violence    Fear of current or ex partner: Patient refused    Emotionally abused: Patient refused    Physically abused: Patient refused    Forced sexual activity: Patient refused  Other Topics Concern  . Not on file  Social History Narrative  . Not on file    Review of Systems: See HPI, otherwise normal ROS  Physical Exam: Temp:  [98.1 F (36.7 C)-98.4 F (36.9 C)] 98.4 F (36.9 C) (08/17 0553) Pulse Rate:  [84-107] 88 (08/17 0553) Resp:  [18-29] 18 (08/17 0553) BP: (107-148)/(71-92) 120/73 (08/17 0553) SpO2:  [94 %-100 %] 98 % (08/17 0553) Weight:  [96.8 kg-99.8  kg] 96.9 kg (08/17 0505) Last BM Date: 11/09/18  Patient is alert and in no acute distress. Conjunctiva is pale.  Sclerae nonicteric. Oropharyngeal mucosa is normal.  He is edentulous. No thyromegaly or lymphadenopathy noted. Cardiac exam with regular rhythm normal S1 and S2.  No murmur or gallop noted. Auscultation of lungs reveal vesicular breath sounds bilaterally. Abdomen is full. He has right upper paramedian scar.  Bowel sounds are normal.  Abdominal wall is somewhat flabby.  On palpation is soft and nontender with organomegaly or masses. No LE edema noted.   Lab Results: Recent Labs    11/08/18 1845 11/09/18 0430  WBC 6.8 6.8  HGB 11.9* 9.8*  HCT 37.1* 32.0*  PLT 279 234   BMET Recent Labs    11/08/18 1845 11/09/18 0430  NA 136 141  K 3.8 3.9  CL 106 111  CO2 22 24  GLUCOSE 123* 107*  BUN 24* 20  CREATININE 0.85 0.74  CALCIUM 9.1 8.4*   LFT Recent Labs    11/09/18 0430  PROT 5.9*  ALBUMIN 3.1*  AST 12*  ALT 17  ALKPHOS 42  BILITOT 0.7   PT/INR Recent Labs    11/09/18 0430  LABPROT 15.2  INR 1.2   Hepatitis Panel No results for input(s): HEPBSAG, HCVAB, HEPAIGM, HEPBIGM in the last 72  hours.  Studies/Results: Ct Abdomen Pelvis W Contrast  Result Date: 11/08/2018 CLINICAL DATA:  Abdominal distension. Heavy rectal bleeding status post recent colon surgery. EXAM: CT ABDOMEN AND PELVIS WITH CONTRAST TECHNIQUE: Multidetector CT imaging of the abdomen and pelvis was performed using the standard protocol following bolus administration of intravenous contrast. CONTRAST:  187mL OMNIPAQUE IOHEXOL 300 MG/ML  SOLN COMPARISON:  MRI abdomen dated June 22, 2015 and CT of the pelvis dated February 05, 2017. FINDINGS: Lower chest: There is some scarring at the left lung base.The heart size is normal. Hepatobiliary: There is a cyst near the dome of the liver, similar to prior study. Normal gallbladder.There is no biliary ductal dilation. Pancreas: Normal contours without ductal dilatation. No peripancreatic fluid collection. Spleen: No splenic laceration or hematoma. Adrenals/Urinary Tract: --Adrenal glands: There is mild thickening of both adrenal glands which appears similar to prior study. --Right kidney/ureter: There is a large upper pole simple cyst. There are additional smaller cysts in the lower pole similar to prior study. There is no hydronephrosis. --Left kidney/ureter: Multiple left-sided cysts are noted without evidence for hydronephrosis. --Urinary bladder: Unremarkable. Stomach/Bowel: --Stomach/Duodenum: No hiatal hernia or other gastric abnormality. Normal duodenal course and caliber. --Small bowel: No dilatation or inflammation. --Colon: There is severe sigmoid diverticulosis without definite CT evidence of diverticulitis. There is diffuse wall thickening of large portions of the sigmoid colon which may be secondary to the underlying diverticular disease versus early diverticulitis/colitis. There is a colonic anastomosis at the level of the transverse colon. The patient is status post prior right hemicolectomy. --Appendix: Surgically absent. Vascular/Lymphatic: Atherosclerotic calcification  is present within the non-aneurysmal abdominal aorta, without hemodynamically significant stenosis. --No retroperitoneal lymphadenopathy. --No mesenteric lymphadenopathy. --No pelvic or inguinal lymphadenopathy. Reproductive: Multiple fiducial markers are noted in the prostate gland. Other: No ascites or free air. There is a fat containing periumbilical hernia. There are bilateral fat containing inguinal hernias, right greater than left. Musculoskeletal. There are degenerative changes throughout the lumbar spine. There are advanced degenerative changes of the left hip. 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 IMPRESSION: 1. Severe near pan colonic diverticulosis. There is diffuse wall  thickening of the distal descending colon and sigmoid colon which may be secondary to early uncomplicated diverticulitis or infectious/inflammatory colitis. An underlying mass is not entirely excluded and should be correlated with age-appropriate screening. 2. Status post right hemicolectomy. 3. Additional chronic findings as detailed above. Electronically Signed   By: Constance Holster M.D.   On: 11/08/2018 20:20    Assessment;  Patient is 78 year old Afro-American male who presents with large volume painless hematochezia his hemoglobin has dropped by about 2 g.  He appears to have stopped bleeding.  Suspect we are dealing with colonic diverticular bleed which he has history of.  Since he had 2 colonoscopies in October 2019 revealing multiple diverticula I do not feel he will need to undergo colonoscopy unless there is evidence of recurrent bleed.  Abdominal pelvic CT on admission did not reveal any evidence of bleeding reveals thickening to colonic wall at descending and sigmoid colon.  Suspect these changes are due to diverticulosis but given his presentation would be reasonable to treat him for diverticulitis.   Recommendations;  Begin clear liquids. Continue IV Cipro and metronidazole. If  patient rebleeds would consider diagnostic/therapeutic colonoscopy.    LOS: 0 days      11/09/2018, 10:06 AM

## 2018-11-09 NOTE — Progress Notes (Signed)
PROGRESS NOTE    Randy Russo  WFU:[id number] DOB: Apr 04, 1940 DOA: 11/08/2018 PCP: Sharilyn Sites, MD    Brief Narrative:  78 year old male with a history of COPD, diverticulosis, previous history of GI bleeding in 12/2017 which resulted in right hemicolectomy.  Patient presented to the hospital with rectal bleeding.  Hemoglobin is currently stable.  GI following.   Assessment & Plan:   Principal Problem:   Rectal bleeding Active Problems:   COPD (chronic obstructive pulmonary disease) ([facility])   Essential hypertension   Gastroesophageal reflux disease without esophagitis   Colitis   1. Rectal bleeding.  Possibly related to diverticulosis.  Appears to be self-limiting and improving.  Continue to follow serial hemoglobin.  GI following.  His last colonoscopy was in 12/2017.  Will consider repeat colonoscopy if he has any evidence of ongoing bleeding. 2. Possible diverticulitis.  Patient is being treated with ciprofloxacin and Flagyl. 3. COPD.  No evidence of shortness of breath or wheezing.   DVT prophylaxis: SCDs Code Status: Full code Family Communication: Discussed with patient Disposition Plan: Anticipate discharge home tomorrow if hemoglobin remains stable and no significant bleeding   Consultants:   Gastroenterology  Procedures:     Antimicrobials:   Cipro 8/16 >  Flagyl 8/16 >   Subjective: Reports having bowel movements today that did not contain any significant blood.  No vomiting, abdominal pain  Objective: Vitals:   11/09/18 0026 11/09/18 0505 11/09/18 0553 11/09/18 1352  BP: (!) 148/76  120/73 128/84  Pulse: 85  88 88  Resp: 18  18 20   Temp: 98.1 F (36.7 C)  98.4 F (36.9 C) 97.8 F (36.6 C)  TempSrc: Oral  Oral Oral  SpO2: 100%  98% 100%  Weight: 96.8 kg 96.9 kg    Height: 5\' 6"  (1.676 m)       Intake/Output Summary (Last 24 hours) at 11/09/2018 1804 Last data filed at 11/09/2018 1300 Gross per 24 hour  Intake 900.82 ml  Output 1150 ml   Net -249.18 ml   Filed Weights   11/08/18 1822 11/09/18 0026 11/09/18 0505  Weight: 99.8 kg 96.8 kg 96.9 kg    Examination:  General exam: Appears calm and comfortable  Respiratory system: Clear to auscultation. Respiratory effort normal. Cardiovascular system: S1 & S2 heard, RRR. No JVD, murmurs, rubs, gallops or clicks. No pedal edema. Gastrointestinal system: Abdomen is nondistended, soft and nontender. No organomegaly or masses felt. Normal bowel sounds heard. Central nervous system: Alert and oriented. No focal neurological deficits. Extremities: Symmetric 5 x 5 power. Skin: No rashes, lesions or ulcers Psychiatry: Judgement and insight appear normal. Mood & affect appropriate.     Data Reviewed: I have personally reviewed following labs and imaging studies  CBC: Recent Labs  Lab 11/08/18 1845 11/09/18 0430 11/09/18 1229  WBC 6.8 6.8 6.3  NEUTROABS 5.1  --   --   HGB 11.9* 9.8* 10.4*  HCT 37.1* 32.0* 34.0*  MCV 94.4 97.6 98.6  PLT 279 234 202   Basic Metabolic Panel: Recent Labs  Lab 11/08/18 1845 11/09/18 0430  NA 136 141  K 3.8 3.9  CL 106 111  CO2 22 24  GLUCOSE 123* 107*  BUN 24* 20  CREATININE 0.85 0.74  CALCIUM 9.1 8.4*   GFR: Estimated Creatinine Clearance: 82.9 mL/min (by C-G formula based on SCr of 0.74 mg/dL). Liver Function Tests: Recent Labs  Lab 11/08/18 1845 11/09/18 0430  AST 17 12*  ALT 21 17  ALKPHOS 54 42  BILITOT 0.4 0.7  PROT 7.1 5.9*  ALBUMIN 3.7 3.1*   No results for input(s): LIPASE, AMYLASE in the last 168 hours. No results for input(s): AMMONIA in the last 168 hours. Coagulation Profile: Recent Labs  Lab 11/09/18 0430  INR 1.2   Cardiac Enzymes: No results for input(s): CKTOTAL, CKMB, CKMBINDEX, TROPONINI in the last 168 hours. BNP (last 3 results) No results for input(s): PROBNP in the last 8760 hours. HbA1C: No results for input(s): HGBA1C in the last 72 hours. CBG: No results for input(s): GLUCAP in the  last 168 hours. Lipid Profile: No results for input(s): CHOL, HDL, LDLCALC, TRIG, CHOLHDL, LDLDIRECT in the last 72 hours. Thyroid Function Tests: No results for input(s): TSH, T4TOTAL, FREET4, T3FREE, THYROIDAB in the last 72 hours. Anemia Panel: No results for input(s): VITAMINB12, FOLATE, FERRITIN, TIBC, IRON, RETICCTPCT in the last 72 hours. Sepsis Labs: No results for input(s): PROCALCITON, LATICACIDVEN in the last 168 hours.  Recent Results (from the past 240 hour(s))  SARS Coronavirus 2 [facility] order, Performed in [facility] hospital lab) Nasopharyngeal Nasopharyngeal Swab     Status: None   Collection Time: 11/08/18  9:13 PM   Specimen: Nasopharyngeal Swab  Result Value Ref Range Status   SARS Coronavirus 2 NEGATIVE NEGATIVE Final    Comment: (NOTE) If result is NEGATIVE SARS-CoV-2 target nucleic acids are NOT DETECTED. The SARS-CoV-2 RNA is generally detectable in upper and lower  respiratory specimens during the acute phase of infection. The lowest  concentration of SARS-CoV-2 viral copies this assay can detect is 250  copies / mL. A negative result does not preclude SARS-CoV-2 infection  and should not be used as the sole basis for treatment or other  patient management decisions.  A negative result may occur with  improper specimen collection / handling, submission of specimen other  than nasopharyngeal swab, presence of viral mutation(s) within the  areas targeted by this assay, and inadequate number of viral copies  (<250 copies / mL). A negative result must be combined with clinical  observations, patient history, and epidemiological information. If result is POSITIVE SARS-CoV-2 target nucleic acids are DETECTED. The SARS-CoV-2 RNA is generally detectable in upper and lower  respiratory specimens dur ing the acute phase of infection.  Positive  results are indicative of active infection with SARS-CoV-2.  Clinical  correlation with patient history and other  diagnostic information is  necessary to determine patient infection status.  Positive results do  not rule out bacterial infection or co-infection with other viruses. If result is PRESUMPTIVE POSTIVE SARS-CoV-2 nucleic acids MAY BE PRESENT.   A presumptive positive result was obtained on the submitted specimen  and confirmed on repeat testing.  While 2019 novel coronavirus  (SARS-CoV-2) nucleic acids may be present in the submitted sample  additional confirmatory testing may be necessary for epidemiological  and / or clinical management purposes  to differentiate between  SARS-CoV-2 and other Sarbecovirus currently known to infect humans.  If clinically indicated additional testing with an alternate test  methodology [id number]) is advised. The SARS-CoV-2 RNA is generally  detectable in upper and lower respiratory sp ecimens during the acute  phase of infection. The expected result is Negative. Fact Sheet for Patients:  StrictlyIdeas.no Fact Sheet for Healthcare Providers: BankingDealers.co.za This test is not yet approved or cleared by the [place] FDA and has been authorized for detection and/or diagnosis of SARS-CoV-2 by FDA under an Emergency Use Authorization (EUA).  This EUA will remain in effect (meaning  this test can be used) for the duration of the COVID-19 declaration under Section 564(b)(1) of the Act, 21 U.S.C. section 360bbb-3(b)(1), unless the authorization is terminated or revoked sooner. Performed at [facility], [address] [place], [place] [address]          Radiology Studies: Ct Abdomen Pelvis W Contrast  Result Date: 11/08/2018 CLINICAL DATA:  Abdominal distension. Heavy rectal bleeding status post recent colon surgery. EXAM: CT ABDOMEN AND PELVIS WITH CONTRAST TECHNIQUE: Multidetector CT imaging of the abdomen and pelvis was performed using the standard protocol following bolus administration of  intravenous contrast. CONTRAST:  145mL OMNIPAQUE IOHEXOL 300 MG/ML  SOLN COMPARISON:  MRI abdomen dated June 22, 2015 and CT of the pelvis dated February 05, 2017. FINDINGS: Lower chest: There is some scarring at the left lung base.The heart size is normal. Hepatobiliary: There is a cyst near the dome of the liver, similar to prior study. Normal gallbladder.There is no biliary ductal dilation. Pancreas: Normal contours without ductal dilatation. No peripancreatic fluid collection. Spleen: No splenic laceration or hematoma. Adrenals/Urinary Tract: --Adrenal glands: There is mild thickening of both adrenal glands which appears similar to prior study. --Right kidney/ureter: There is a large upper pole simple cyst. There are additional smaller cysts in the lower pole similar to prior study. There is no hydronephrosis. --Left kidney/ureter: Multiple left-sided cysts are noted without evidence for hydronephrosis. --Urinary bladder: Unremarkable. Stomach/Bowel: --Stomach/Duodenum: No hiatal hernia or other gastric abnormality. Normal duodenal course and caliber. --Small bowel: No dilatation or inflammation. --Colon: There is severe sigmoid diverticulosis without definite CT evidence of diverticulitis. There is diffuse wall thickening of large portions of the sigmoid colon which may be secondary to the underlying diverticular disease versus early diverticulitis/colitis. There is a colonic anastomosis at the level of the transverse colon. The patient is status post prior right hemicolectomy. --Appendix: Surgically absent. Vascular/Lymphatic: Atherosclerotic calcification is present within the non-aneurysmal abdominal aorta, without hemodynamically significant stenosis. --No retroperitoneal lymphadenopathy. --No mesenteric lymphadenopathy. --No pelvic or inguinal lymphadenopathy. Reproductive: Multiple fiducial markers are noted in the prostate gland. Other: No ascites or free air. There is a fat containing periumbilical  hernia. There are bilateral fat containing inguinal hernias, right greater than left. Musculoskeletal. There are degenerative changes throughout the lumbar spine. There are advanced degenerative changes of the left hip. 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 IMPRESSION: 1. Severe near pan colonic diverticulosis. There is diffuse wall thickening of the distal descending colon and sigmoid colon which may be secondary to early uncomplicated diverticulitis or infectious/inflammatory colitis. An underlying mass is not entirely excluded and should be correlated with age-appropriate screening. 2. Status post right hemicolectomy. 3. Additional chronic findings as detailed above. Electronically Signed   By: Constance Holster M.D.   On: 11/08/2018 20:20        Scheduled Meds: Continuous Infusions: . ciprofloxacin 400 mg (11/09/18 0933)  . metronidazole 500 mg (11/09/18 1350)     LOS: 0 days    Time spent: 31mins    Kathie Dike, MD [facility]   If 7PM-7AM, please contact night-coverage [email or web address]  11/09/2018, 6:04 PM

## 2018-11-09 NOTE — Progress Notes (Signed)
Patient reports to this nurse, He had a large amount of blood when going to have a BM. This nurse was unable to assess the amount, and color. Patient flushed the commode before reporting. Will make MD aware.

## 2018-11-09 NOTE — Care Management Obs Status (Signed)
[facility] NOTIFICATION   Patient Details  Name: Randy Russo MRN: [id number] Date of Birth: 15-Jul-1940   Medicare Observation Status Notification Given:  Yes    Arsenia Goracke, Chauncey Reading, RN 11/09/2018, 1:44 PM

## 2018-11-10 DIAGNOSIS — K625 Hemorrhage of anus and rectum: Secondary | ICD-10-CM

## 2018-11-10 DIAGNOSIS — D62 Acute posthemorrhagic anemia: Secondary | ICD-10-CM | POA: Diagnosis not present

## 2018-11-10 DIAGNOSIS — I10 Essential (primary) hypertension: Secondary | ICD-10-CM | POA: Diagnosis not present

## 2018-11-10 DIAGNOSIS — J449 Chronic obstructive pulmonary disease, unspecified: Secondary | ICD-10-CM

## 2018-11-10 DIAGNOSIS — K529 Noninfective gastroenteritis and colitis, unspecified: Secondary | ICD-10-CM

## 2018-11-10 LAB — CBC
HCT: 31.3 % — ABNORMAL LOW (ref 39.0–52.0)
Hemoglobin: 9.6 g/dL — ABNORMAL LOW (ref 13.0–17.0)
MCH: 29.5 pg (ref 26.0–34.0)
MCHC: 30.7 g/dL (ref 30.0–36.0)
MCV: 96.3 fL (ref 80.0–100.0)
Platelets: 246 10*3/uL (ref 150–400)
RBC: 3.25 MIL/uL — ABNORMAL LOW (ref 4.22–5.81)
RDW: 14.3 % (ref 11.5–15.5)
WBC: 7.1 10*3/uL (ref 4.0–10.5)
nRBC: 0 % (ref 0.0–0.2)

## 2018-11-10 MED ORDER — CIPROFLOXACIN HCL 500 MG PO TABS: 500.0000 mg | ORAL_TABLET | Freq: Two times a day (BID) | ORAL | 0 refills | Status: AC

## 2018-11-10 MED ORDER — METRONIDAZOLE 500 MG PO TABS: 500.0000 mg | ORAL_TABLET | Freq: Two times a day (BID) | ORAL | 0 refills | Status: AC

## 2018-11-10 MED ORDER — CIPROFLOXACIN HCL 250 MG PO TABS
500.0000 mg | ORAL_TABLET | Freq: Two times a day (BID) | ORAL | Status: DC
  Administered 2018-11-10: 500 mg via ORAL
  Filled 2018-11-10: qty 2

## 2018-11-10 MED ORDER — METRONIDAZOLE 500 MG PO TABS
500.0000 mg | ORAL_TABLET | Freq: Two times a day (BID) | ORAL | Status: DC
  Administered 2018-11-10: 500 mg via ORAL
  Filled 2018-11-10: qty 1

## 2018-11-10 NOTE — Progress Notes (Signed)
  Subjective:  Patient has no complaints.  Patient says he had 3 bowel movements during the night and he did not see any blood.  He did pass some dark blood last evening.  He denies abdominal pain nausea or vomiting.  He is hungry. Objective: Blood pressure 107/74, pulse 86, temperature 98.2 F (36.8 C), temperature source Oral, resp. rate 18, height '5\' 6"'$  (1.676 m), weight 96.4 kg, SpO2 100 %. Patient is alert and in no acute distress. Abdomen is full.  He has scar across upper abdomen line around right side of umbilicus.  Bowel sounds are normal.  On palpation abdomen is soft and nontender with organomegaly or masses.  Labs/studies Results:  CBC Latest Ref Rng & Units 11/10/2018 11/09/2018 11/09/2018  WBC 4.0 - 10.5 K/uL 7.1 6.3 6.8  Hemoglobin 13.0 - 17.0 g/dL 9.6(L) 10.4(L) 9.8(L)  Hematocrit 39.0 - 52.0 % 31.3(L) 34.0(L) 32.0(L)  Platelets 150 - 400 K/uL 246 241 234    CMP Latest Ref Rng & Units 11/09/2018 11/08/2018 01/07/2018  Glucose 70 - 99 mg/dL 107(H) 123(H) 129(H)  BUN 8 - 23 mg/dL 20 24(H) 10  Creatinine 0.61 - 1.24 mg/dL 0.74 0.85 0.76  Sodium 135 - 145 mmol/L 141 136 137  Potassium 3.5 - 5.1 mmol/L 3.9 3.8 4.2  Chloride 98 - 111 mmol/L 111 106 106  CO2 22 - 32 mmol/L '24 22 23  '$ Calcium 8.9 - 10.3 mg/dL 8.4(L) 9.1 7.7(L)  Total Protein 6.5 - 8.1 g/dL 5.9(L) 7.1 -  Total Bilirubin 0.3 - 1.2 mg/dL 0.7 0.4 -  Alkaline Phos 38 - 126 U/L 42 54 -  AST 15 - 41 U/L 12(L) 17 -  ALT 0 - 44 U/L 17 21 -    Hepatic Function Latest Ref Rng & Units 11/09/2018 11/08/2018 01/06/2018  Total Protein 6.5 - 8.1 g/dL 5.9(L) 7.1 4.9(L)  Albumin 3.5 - 5.0 g/dL 3.1(L) 3.7 2.4(L)  AST 15 - 41 U/L 12(L) 17 22  ALT 0 - 44 U/L 17 21 34  Alk Phosphatase 38 - 126 U/L 42 54 35(L)  Total Bilirubin 0.3 - 1.2 mg/dL 0.7 0.4 0.8      Assessment:  #1.  Lower GI bleed felt to be secondary to colonic diverticular bleed which he has history of and underwent right hemicolectomy in October 2019.  His vital  signs are stable.  No evidence of recurrent bleed.  #2.  Anemia secondary to GI blood loss.  No significant change in hemoglobin over the last 24 hours.  All in all hemoglobin has dropped by 2.3 g since admission.  #3.  Possible diverticulitis given CT findings.  Abdominal examination is benign.  Patient is on IV Cipro and metronidazole.   Recommendations:  Advance diet to heart healthy. Change antibiotic to oral route and continue for 1 week. May be able to go home this afternoon unless evidence of recurrent bleed.

## 2018-11-10 NOTE — Progress Notes (Signed)
PT DISCHARGED HOME, IV REMOVED, ANGIO INTACT, VS STABLE, DENIES C/O PAIN, VERBALIZED UNDERSTANDING OF ALL INSTRUCTIONS PROVIDED. PT LEFT FLOOR VIA WHEELCHAIR WITH BELONGINGS ACCOMPANIED BY DAUGHTER & NURSING STAFF.

## 2018-11-10 NOTE — Discharge Summary (Signed)
Physician Discharge Summary  Randy Russo JHE:[id number] DOB: [birth year] DOA: 11/08/2018  PCP: Sharilyn Sites, MD  Admit date: 11/08/2018 Discharge date: 11/10/2018  Time spent: 30 minutes  Recommendations for Outpatient Follow-up:  1. Repeat CBC to follow hemoglobin trend.   Discharge Diagnoses:  Principal Problem:   Rectal bleeding Active Problems:   COPD (chronic obstructive pulmonary disease) (HCC)   Essential hypertension   Gastroesophageal reflux disease without esophagitis   Colitis acute blood loss anemia  Discharge Condition: Stable and improved.  Patient discharged home with instructions to follow-up with PCP in 3 days.  Diet recommendation: Heart healthy diet.  Filed Weights   11/09/18 0026 11/09/18 0505 11/10/18 0630  Weight: 96.8 kg 96.9 kg 96.4 kg    History of present illness:  As per H&P written by Dr. Maudie Mercury on 11/08/18  78 y.o. male, w hypertension, Copd not on home o2, h/o gout, Gerd, Diverticulosis, w hx of lower GI bleeding 12/27/2017, w colonoscopy 12/28/2017   apparently presents with c/o rectal bleeding x3 at home today.  Pt has had 2 episodes while in ER.  Pt notes blood on toilet paper and in bowel.  Slight right lower abdominal cramping at times.   Pt denies NSAID or aspirin use,  Just taking some tylenol .  Pt denies fever, chills, diarrhea, black stool, dysuria, hematuria.   In ED T 98.4, P 107 R 18, Bp 129/82  Pox 95% on RA Wt 99.8kg  CT scan abd/ pelvis IMPRESSION: 1. Severe near pan colonic diverticulosis. There is diffuse wall thickening of the distal descending colon and sigmoid colon which may be secondary to early uncomplicated diverticulitis or infectious/inflammatory colitis. An underlying mass is not entirely excluded and should be correlated with age-appropriate screening. 2. Status post right hemicolectomy. 3. Additional chronic findings as detailed above.   Na 136, K 3.8, Bun 24, Creatinine 0.85 Glucose 123 Ast 17, Alt 21 Wbc  6.8, Hgb 11.9, plt 279 Type and screen completed  Covid -19: negative  Hospital Course:  1-rectal bleeding: -Appears to be secondary to diverticulosis -Episodes self-limited, at time of discharge and no further bloody stools appreciated. -Patient tolerating diet, no nausea, no vomiting. -Following GI recommendations will complete 1 week of oral antibiotics to treat presumed underlying diverticulitis process.  2-HTN -currently not taking medications -BP stable -continue heart healthy diet   3-acute blood loss anemia: patient with chronic iron deficiency anemia. -Hgb 9.6 at time of discharge -No transfusions required -Repeat CBC WBC to reassess hemoglobin trend.  4-COPD -Patient denies shortness of breath -No wheezing on examination -Good oxygen saturation on room air -Continue as needed albuterol.   Procedures:  See below for x-ray reports.  Consultations:  Gastroenterology service  Discharge Exam: Vitals:   11/09/18 1352 11/10/18 0630  BP: 128/84 107/74  Pulse: 88 86  Resp: 20 18  Temp: 97.8 F (36.6 C) 98.2 F (36.8 C)  SpO2: 100% 100%    General: Afebrile, no chest pain, no shortness of breath, no nausea, no vomiting, no abdominal pain.  Reports no further blood in his stools. Cardiovascular: S1 and S2, no rubs, no gallops, no JVD. Respiratory: Clear to auscultation bilaterally; normal respiratory effort. Abdomen: Soft, nontender, nondistended, positive bowel sounds Extremities: Trace edema bilaterally, no cyanosis or clubbing.  Discharge Instructions   Discharge Instructions    Diet - low sodium heart healthy   Complete by: As directed    Discharge instructions   Complete by: As directed    Keep yourself well-hydrated Take  medications as prescribed Follow soft diet and minimize increased residues Arrange follow-up with PCP in 10 days   Increase activity slowly   Complete by: As directed      Allergies as of 11/10/2018   No Known Allergies      Medication List    STOP taking these medications   ALLOPURINOL PO     TAKE these medications   albuterol 108 (90 Base) MCG/ACT inhaler Commonly known as: VENTOLIN HFA Inhale 1-2 puffs into the lungs every 6 (six) hours as needed for wheezing or shortness of breath.   albuterol (2.5 MG/3ML) 0.083% nebulizer solution Commonly known as: PROVENTIL Take 2.5 mg by nebulization every 6 (six) hours as needed for wheezing or shortness of breath.   cholecalciferol 10 MCG (400 UNIT) Tabs tablet Commonly known as: VITAMIN D3 Take 400 Units by mouth daily.   ciprofloxacin 500 MG tablet Commonly known as: CIPRO Take 1 tablet (500 mg total) by mouth 2 (two) times daily for 5 days.   Fish Oil 1200 MG Caps Take 1,200 mg by mouth daily.   metroNIDAZOLE 500 MG tablet Commonly known as: FLAGYL Take 1 tablet (500 mg total) by mouth every 12 (twelve) hours for 5 days.      No Known Allergies Follow-up Information    Sharilyn Sites, MD. Schedule an appointment as soon as possible for a visit in 10 day(s).   Specialty: Family Medicine Contact information: [address] [place] [address] [phone]           The results of significant diagnostics from this hospitalization (including imaging, microbiology, ancillary and laboratory) are listed below for reference.    Significant Diagnostic Studies: Ct Abdomen Pelvis W Contrast  Result Date: 11/08/2018 CLINICAL DATA:  Abdominal distension. Heavy rectal bleeding status post recent colon surgery. EXAM: CT ABDOMEN AND PELVIS WITH CONTRAST TECHNIQUE: Multidetector CT imaging of the abdomen and pelvis was performed using the standard protocol following bolus administration of intravenous contrast. CONTRAST:  123mL OMNIPAQUE IOHEXOL 300 MG/ML  SOLN COMPARISON:  MRI abdomen dated June 22, 2015 and CT of the pelvis dated February 05, 2017. FINDINGS: Lower chest: There is some scarring at the left lung base.The heart size is normal.  Hepatobiliary: There is a cyst near the dome of the liver, similar to prior study. Normal gallbladder.There is no biliary ductal dilation. Pancreas: Normal contours without ductal dilatation. No peripancreatic fluid collection. Spleen: No splenic laceration or hematoma. Adrenals/Urinary Tract: --Adrenal glands: There is mild thickening of both adrenal glands which appears similar to prior study. --Right kidney/ureter: There is a large upper pole simple cyst. There are additional smaller cysts in the lower pole similar to prior study. There is no hydronephrosis. --Left kidney/ureter: Multiple left-sided cysts are noted without evidence for hydronephrosis. --Urinary bladder: Unremarkable. Stomach/Bowel: --Stomach/Duodenum: No hiatal hernia or other gastric abnormality. Normal duodenal course and caliber. --Small bowel: No dilatation or inflammation. --Colon: There is severe sigmoid diverticulosis without definite CT evidence of diverticulitis. There is diffuse wall thickening of large portions of the sigmoid colon which may be secondary to the underlying diverticular disease versus early diverticulitis/colitis. There is a colonic anastomosis at the level of the transverse colon. The patient is status post prior right hemicolectomy. --Appendix: Surgically absent. Vascular/Lymphatic: Atherosclerotic calcification is present within the non-aneurysmal abdominal aorta, without hemodynamically significant stenosis. --No retroperitoneal lymphadenopathy. --No mesenteric lymphadenopathy. --No pelvic or inguinal lymphadenopathy. Reproductive: Multiple fiducial markers are noted in the prostate gland. Other: No ascites or free air. There is  a fat containing periumbilical hernia. There are bilateral fat containing inguinal hernias, right greater than left. Musculoskeletal. There are degenerative changes throughout the lumbar spine. There are advanced degenerative changes of the left hip. 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 IMPRESSION: 1. Severe near pan colonic diverticulosis. There is diffuse wall thickening of the distal descending colon and sigmoid colon which may be secondary to early uncomplicated diverticulitis or infectious/inflammatory colitis. An underlying mass is not entirely excluded and should be correlated with age-appropriate screening. 2. Status post right hemicolectomy. 3. Additional chronic findings as detailed above. Electronically Signed   By: Constance Holster M.D.   On: 11/08/2018 20:20    Microbiology: Recent Results (from the past 240 hour(s))  SARS Coronavirus 2 [facility] order, Performed in [facility] hospital lab) Nasopharyngeal Nasopharyngeal Swab     Status: None   Collection Time: 11/08/18  9:13 PM   Specimen: Nasopharyngeal Swab  Result Value Ref Range Status   SARS Coronavirus 2 NEGATIVE NEGATIVE Final    Comment: (NOTE) If result is NEGATIVE SARS-CoV-2 target nucleic acids are NOT DETECTED. The SARS-CoV-2 RNA is generally detectable in upper and lower  respiratory specimens during the acute phase of infection. The lowest  concentration of SARS-CoV-2 viral copies this assay can detect is 250  copies / mL. A negative result does not preclude SARS-CoV-2 infection  and should not be used as the sole basis for treatment or other  patient management decisions.  A negative result may occur with  improper specimen collection / handling, submission of specimen other  than nasopharyngeal swab, presence of viral mutation(s) within the  areas targeted by this assay, and inadequate number of viral copies  (<250 copies / mL). A negative result must be combined with clinical  observations, patient history, and epidemiological information. If result is POSITIVE SARS-CoV-2 target nucleic acids are DETECTED. The SARS-CoV-2 RNA is generally detectable in upper and lower  respiratory specimens dur ing the acute phase of infection.  Positive  results are indicative  of active infection with SARS-CoV-2.  Clinical  correlation with patient history and other diagnostic information is  necessary to determine patient infection status.  Positive results do  not rule out bacterial infection or co-infection with other viruses. If result is PRESUMPTIVE POSTIVE SARS-CoV-2 nucleic acids MAY BE PRESENT.   A presumptive positive result was obtained on the submitted specimen  and confirmed on repeat testing.  While 2019 novel coronavirus  (SARS-CoV-2) nucleic acids may be present in the submitted sample  additional confirmatory testing may be necessary for epidemiological  and / or clinical management purposes  to differentiate between  SARS-CoV-2 and other Sarbecovirus currently known to infect humans.  If clinically indicated additional testing with an alternate test  methodology [phone]) is advised. The SARS-CoV-2 RNA is generally  detectable in upper and lower respiratory sp ecimens during the acute  phase of infection. The expected result is Negative. Fact Sheet for Patients:  StrictlyIdeas.no Fact Sheet for Healthcare Providers: BankingDealers.co.za This test is not yet approved or cleared by the [place] FDA and has been authorized for detection and/or diagnosis of SARS-CoV-2 by FDA under an Emergency Use Authorization (EUA).  This EUA will remain in effect (meaning this test can be used) for the duration of the COVID-19 declaration under Section 564(b)(1) of the Act, 21 U.S.C. section 360bbb-3(b)(1), unless the authorization is terminated or revoked sooner. Performed at [facility], [address] [place], [place] [address]  Labs: Basic Metabolic Panel: Recent Labs  Lab 11/08/18 1845 11/09/18 0430  NA 136 141  K 3.8 3.9  CL 106 111  CO2 22 24  GLUCOSE 123* 107*  BUN 24* 20  CREATININE 0.85 0.74  CALCIUM 9.1 8.4*   Liver Function Tests: Recent Labs  Lab 11/08/18 1845  11/09/18 0430  AST 17 12*  ALT 21 17  ALKPHOS 54 42  BILITOT 0.4 0.7  PROT 7.1 5.9*  ALBUMIN 3.7 3.1*   CBC: Recent Labs  Lab 11/08/18 1845 11/09/18 0430 11/09/18 1229 11/10/18 0438  WBC 6.8 6.8 6.3 7.1  NEUTROABS 5.1  --   --   --   HGB 11.9* 9.8* 10.4* 9.6*  HCT 37.1* 32.0* 34.0* 31.3*  MCV 94.4 97.6 98.6 96.3  PLT 279 234 241 246    Signed:  Barton Dubois MD.  [facility] 11/10/2018, 1:14 PM

## 2018-11-10 NOTE — Progress Notes (Signed)
Patient tolerated heart healthy diet well with no complaints.

## 2018-11-16 ENCOUNTER — Other Ambulatory Visit: Payer: Self-pay | Admitting: Urology

## 2018-11-16 ENCOUNTER — Other Ambulatory Visit (HOSPITAL_COMMUNITY): Payer: Self-pay | Admitting: Urology

## 2018-11-16 DIAGNOSIS — C61 Malignant neoplasm of prostate: Secondary | ICD-10-CM

## 2018-11-16 DIAGNOSIS — R9721 Rising PSA following treatment for malignant neoplasm of prostate: Secondary | ICD-10-CM

## 2018-11-17 DIAGNOSIS — E6609 Other obesity due to excess calories: Secondary | ICD-10-CM | POA: Diagnosis not present

## 2018-11-17 DIAGNOSIS — M1712 Unilateral primary osteoarthritis, left knee: Secondary | ICD-10-CM | POA: Diagnosis not present

## 2018-11-17 DIAGNOSIS — M1991 Primary osteoarthritis, unspecified site: Secondary | ICD-10-CM | POA: Diagnosis not present

## 2018-11-17 DIAGNOSIS — Z6831 Body mass index (BMI) 31.0-31.9, adult: Secondary | ICD-10-CM | POA: Diagnosis not present

## 2018-11-24 DIAGNOSIS — H524 Presbyopia: Secondary | ICD-10-CM | POA: Diagnosis not present

## 2018-11-24 DIAGNOSIS — Z961 Presence of intraocular lens: Secondary | ICD-10-CM | POA: Diagnosis not present

## 2018-11-24 DIAGNOSIS — H02403 Unspecified ptosis of bilateral eyelids: Secondary | ICD-10-CM | POA: Diagnosis not present

## 2018-11-24 DIAGNOSIS — H401131 Primary open-angle glaucoma, bilateral, mild stage: Secondary | ICD-10-CM | POA: Diagnosis not present

## 2018-12-11 ENCOUNTER — Ambulatory Visit (INDEPENDENT_AMBULATORY_CARE_PROVIDER_SITE_OTHER): Payer: Medicare HMO | Admitting: Urology

## 2018-12-11 ENCOUNTER — Other Ambulatory Visit: Payer: Self-pay

## 2018-12-11 DIAGNOSIS — R350 Frequency of micturition: Secondary | ICD-10-CM | POA: Diagnosis not present

## 2018-12-11 DIAGNOSIS — R351 Nocturia: Secondary | ICD-10-CM | POA: Diagnosis not present

## 2018-12-11 DIAGNOSIS — R9721 Rising PSA following treatment for malignant neoplasm of prostate: Secondary | ICD-10-CM

## 2018-12-14 ENCOUNTER — Telehealth: Payer: Self-pay | Admitting: Radiology

## 2018-12-14 NOTE — Telephone Encounter (Signed)
Pt had an order for ct a/p.  It was cancelled and the order purged.  Pt had done in the ED on 8-16.

## 2018-12-17 ENCOUNTER — Other Ambulatory Visit (HOSPITAL_COMMUNITY): Payer: Self-pay | Admitting: Urology

## 2018-12-17 ENCOUNTER — Other Ambulatory Visit: Payer: Self-pay | Admitting: Urology

## 2018-12-17 DIAGNOSIS — R9721 Rising PSA following treatment for malignant neoplasm of prostate: Secondary | ICD-10-CM

## 2018-12-17 DIAGNOSIS — C61 Malignant neoplasm of prostate: Secondary | ICD-10-CM

## 2018-12-21 ENCOUNTER — Other Ambulatory Visit (HOSPITAL_COMMUNITY): Payer: Self-pay | Admitting: Urology

## 2018-12-21 DIAGNOSIS — C61 Malignant neoplasm of prostate: Secondary | ICD-10-CM

## 2018-12-21 DIAGNOSIS — R9721 Rising PSA following treatment for malignant neoplasm of prostate: Secondary | ICD-10-CM

## 2018-12-25 ENCOUNTER — Encounter (HOSPITAL_COMMUNITY): Payer: Medicare HMO

## 2018-12-25 ENCOUNTER — Encounter (HOSPITAL_COMMUNITY): Admission: RE | Admit: 2018-12-25 | Payer: Medicare HMO | Source: Ambulatory Visit

## 2018-12-29 ENCOUNTER — Encounter (HOSPITAL_COMMUNITY)
Admission: RE | Admit: 2018-12-29 | Discharge: 2018-12-29 | Disposition: A | Discharge status: Home / Self Care | Payer: Medicare HMO | Source: Ambulatory Visit | Attending: Urology | Admitting: Urology

## 2018-12-29 ENCOUNTER — Other Ambulatory Visit: Payer: Self-pay

## 2018-12-29 ENCOUNTER — Encounter (HOSPITAL_COMMUNITY): Payer: Self-pay

## 2018-12-29 DIAGNOSIS — R9721 Rising PSA following treatment for malignant neoplasm of prostate: Secondary | ICD-10-CM | POA: Diagnosis not present

## 2018-12-29 DIAGNOSIS — C61 Malignant neoplasm of prostate: Secondary | ICD-10-CM

## 2018-12-29 HISTORY — DX: Malignant (primary) neoplasm, unspecified: C80.1

## 2018-12-29 MED ORDER — TECHNETIUM TC 99M MEDRONATE IV KIT
20.0000 | PACK | Freq: Once | INTRAVENOUS | Status: AC | PRN
  Administered 2018-12-29: 11:00:00 19 via INTRAVENOUS

## 2019-01-12 ENCOUNTER — Other Ambulatory Visit (HOSPITAL_COMMUNITY): Payer: Self-pay | Admitting: Urology

## 2019-01-12 DIAGNOSIS — R9721 Rising PSA following treatment for malignant neoplasm of prostate: Secondary | ICD-10-CM

## 2019-01-12 DIAGNOSIS — C61 Malignant neoplasm of prostate: Secondary | ICD-10-CM

## 2019-01-18 ENCOUNTER — Other Ambulatory Visit: Payer: Self-pay

## 2019-01-18 ENCOUNTER — Ambulatory Visit (HOSPITAL_COMMUNITY)
Admission: RE | Admit: 2019-01-18 | Discharge: 2019-01-18 | Disposition: A | Discharge status: Home / Self Care | Payer: Medicare HMO | Source: Ambulatory Visit | Attending: Urology | Admitting: Urology

## 2019-01-18 DIAGNOSIS — C61 Malignant neoplasm of prostate: Secondary | ICD-10-CM

## 2019-01-18 DIAGNOSIS — R9721 Rising PSA following treatment for malignant neoplasm of prostate: Secondary | ICD-10-CM

## 2019-01-18 DIAGNOSIS — K439 Ventral hernia without obstruction or gangrene: Secondary | ICD-10-CM | POA: Diagnosis not present

## 2019-01-18 LAB — POCT I-STAT CREATININE: Creatinine, Ser: 0.8 mg/dL (ref 0.61–1.24)

## 2019-01-18 MED ORDER — IOHEXOL 300 MG/ML  SOLN
100.0000 mL | Freq: Once | INTRAMUSCULAR | Status: AC | PRN
  Administered 2019-01-18: 100 mL via INTRAVENOUS

## 2019-01-28 DIAGNOSIS — R9721 Rising PSA following treatment for malignant neoplasm of prostate: Secondary | ICD-10-CM | POA: Diagnosis not present

## 2019-01-29 ENCOUNTER — Ambulatory Visit (INDEPENDENT_AMBULATORY_CARE_PROVIDER_SITE_OTHER): Payer: Medicare HMO | Admitting: Urology

## 2019-01-29 DIAGNOSIS — R9721 Rising PSA following treatment for malignant neoplasm of prostate: Secondary | ICD-10-CM

## 2019-01-29 DIAGNOSIS — Z8546 Personal history of malignant neoplasm of prostate: Secondary | ICD-10-CM

## 2019-02-10 DIAGNOSIS — Z6831 Body mass index (BMI) 31.0-31.9, adult: Secondary | ICD-10-CM | POA: Diagnosis not present

## 2019-02-10 DIAGNOSIS — K219 Gastro-esophageal reflux disease without esophagitis: Secondary | ICD-10-CM | POA: Diagnosis not present

## 2019-02-10 DIAGNOSIS — J441 Chronic obstructive pulmonary disease with (acute) exacerbation: Secondary | ICD-10-CM | POA: Diagnosis not present

## 2019-02-10 DIAGNOSIS — J329 Chronic sinusitis, unspecified: Secondary | ICD-10-CM | POA: Diagnosis not present

## 2019-02-23 ENCOUNTER — Other Ambulatory Visit (HOSPITAL_COMMUNITY): Payer: Self-pay | Admitting: Urology

## 2019-02-23 DIAGNOSIS — C61 Malignant neoplasm of prostate: Secondary | ICD-10-CM

## 2019-03-01 ENCOUNTER — Encounter (HOSPITAL_COMMUNITY)
Admission: RE | Admit: 2019-03-01 | Discharge: 2019-03-01 | Disposition: A | Discharge status: Home / Self Care | Payer: Medicare HMO | Source: Ambulatory Visit | Attending: Urology | Admitting: Urology

## 2019-03-01 ENCOUNTER — Other Ambulatory Visit: Payer: Self-pay

## 2019-03-01 DIAGNOSIS — C61 Malignant neoplasm of prostate: Secondary | ICD-10-CM

## 2019-03-01 MED ORDER — FLUDEOXYGLUCOSE F - 18 (FDG) INJECTION
10.7800 | Freq: Once | INTRAVENOUS | Status: AC | PRN
  Administered 2019-03-01: 19:00:00 10.78 via INTRAVENOUS

## 2019-03-11 DIAGNOSIS — H02834 Dermatochalasis of left upper eyelid: Secondary | ICD-10-CM | POA: Diagnosis not present

## 2019-03-11 DIAGNOSIS — H02403 Unspecified ptosis of bilateral eyelids: Secondary | ICD-10-CM | POA: Diagnosis not present

## 2019-03-11 DIAGNOSIS — H02831 Dermatochalasis of right upper eyelid: Secondary | ICD-10-CM | POA: Diagnosis not present

## 2019-03-11 DIAGNOSIS — H0289 Other specified disorders of eyelid: Secondary | ICD-10-CM | POA: Diagnosis not present

## 2019-03-12 ENCOUNTER — Other Ambulatory Visit: Payer: Self-pay

## 2019-03-12 DIAGNOSIS — R9721 Rising PSA following treatment for malignant neoplasm of prostate: Secondary | ICD-10-CM

## 2019-03-12 DIAGNOSIS — C61 Malignant neoplasm of prostate: Secondary | ICD-10-CM

## 2019-03-31 DIAGNOSIS — Z20828 Contact with and (suspected) exposure to other viral communicable diseases: Secondary | ICD-10-CM | POA: Diagnosis not present

## 2019-03-31 DIAGNOSIS — H02834 Dermatochalasis of left upper eyelid: Secondary | ICD-10-CM | POA: Diagnosis not present

## 2019-03-31 DIAGNOSIS — H02831 Dermatochalasis of right upper eyelid: Secondary | ICD-10-CM | POA: Diagnosis not present

## 2019-04-02 DIAGNOSIS — H02831 Dermatochalasis of right upper eyelid: Secondary | ICD-10-CM | POA: Diagnosis not present

## 2019-04-02 DIAGNOSIS — H02834 Dermatochalasis of left upper eyelid: Secondary | ICD-10-CM | POA: Diagnosis not present

## 2019-04-05 ENCOUNTER — Other Ambulatory Visit: Payer: Self-pay

## 2019-04-05 DIAGNOSIS — Z8546 Personal history of malignant neoplasm of prostate: Secondary | ICD-10-CM

## 2019-06-03 DIAGNOSIS — H02403 Unspecified ptosis of bilateral eyelids: Secondary | ICD-10-CM | POA: Diagnosis not present

## 2019-06-03 DIAGNOSIS — H401131 Primary open-angle glaucoma, bilateral, mild stage: Secondary | ICD-10-CM | POA: Diagnosis not present

## 2019-06-03 DIAGNOSIS — Z961 Presence of intraocular lens: Secondary | ICD-10-CM | POA: Diagnosis not present

## 2019-06-17 DIAGNOSIS — Z961 Presence of intraocular lens: Secondary | ICD-10-CM | POA: Diagnosis not present

## 2019-06-17 DIAGNOSIS — H02403 Unspecified ptosis of bilateral eyelids: Secondary | ICD-10-CM | POA: Diagnosis not present

## 2019-06-17 DIAGNOSIS — H401131 Primary open-angle glaucoma, bilateral, mild stage: Secondary | ICD-10-CM | POA: Diagnosis not present

## 2019-07-28 ENCOUNTER — Other Ambulatory Visit: Payer: Self-pay | Admitting: Urology

## 2019-07-28 DIAGNOSIS — Z8546 Personal history of malignant neoplasm of prostate: Secondary | ICD-10-CM | POA: Diagnosis not present

## 2019-07-29 LAB — PSA: PSA: 5.3 ng/mL — ABNORMAL HIGH (ref <=4.0)

## 2019-07-29 NOTE — Progress Notes (Deleted)
Subjective:  No diagnosis found.  CC: Rising PSA.    01/29/19: Devvin returns today in f/u for his history of prostate cancer with a rising PSA. He is 10 years out from radiation therapy for T2b Gleason prostate cancer with a pretreatment PSA of 16.5. 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 His PSA is up slightly to 6.1 but that is only about 0.4 points since July. He had a bone scan prior to this visit that is negative as was his CT scan. He has some frequency and nocturia with urgency with his diuretic. IPSS is 11. He has no hematuria or dysuria. He has no associated signs or symptoms.   Axumin PET negative on 02/29/20.         ROS:  ROS:  A complete review of systems was performed.  All systems are negative except for pertinent findings as noted.   ROS  No Known Allergies  Outpatient Encounter Medications as of 07/30/2019  Medication Sig  . albuterol (PROVENTIL) (2.5 MG/3ML) 0.083% nebulizer solution Take 2.5 mg by nebulization every 6 (six) hours as needed for wheezing or shortness of breath.  Marland Kitchen albuterol (VENTOLIN HFA) 108 (90 Base) MCG/ACT inhaler Inhale 1-2 puffs into the lungs every 6 (six) hours as needed for wheezing or shortness of breath.  . cholecalciferol (VITAMIN D) 400 units TABS tablet Take 400 Units by mouth daily.   . Omega-3 Fatty Acids (FISH OIL) 1200 MG CAPS Take 1,200 mg by mouth daily.   No facility-administered encounter medications on file as of 07/30/2019.    Past Medical History:  Diagnosis Date  . Arthritis   . Cancer [facility])    Prostate  . COPD (chronic obstructive pulmonary disease) ([place])   . Hypertension     Past Surgical History:  Procedure Laterality Date  . CATARACT EXTRACTION W/PHACO Left 11/16/2012   Procedure: CATARACT EXTRACTION PHACO AND INTRAOCULAR LENS PLACEMENT (IOC);  Surgeon: Tonny Branch, MD;  Location: [facility];  Service: Ophthalmology;  Laterality: Left;  CDE: 13.66  . CATARACT EXTRACTION W/PHACO  Right 01/18/2013   Procedure: CATARACT EXTRACTION PHACO AND INTRAOCULAR LENS PLACEMENT (IOC);  Surgeon: Tonny Branch, MD;  Location: [facility];  Service: Ophthalmology;  Laterality: Right;  CDE:8.79  . CERVICAL FUSION  4 yrs ago  . COLONOSCOPY N/A 12/28/2017   Procedure: COLONOSCOPY;  Surgeon: Rogene Houston, MD;  Location: [facility];  Service: Endoscopy;  Laterality: N/A;  . COLONOSCOPY N/A 01/01/2018   Procedure: COLONOSCOPY;  Surgeon: Rogene Houston, MD;  Location: [facility];  Service: Endoscopy;  Laterality: N/A;  . ESOPHAGOGASTRODUODENOSCOPY N/A 01/01/2018   Procedure: ESOPHAGOGASTRODUODENOSCOPY (EGD);  Surgeon: Rogene Houston, MD;  Location: [facility];  Service: Endoscopy;  Laterality: N/A;  . JOINT REPLACEMENT Left 5 yrs ago   knee  . PARTIAL COLECTOMY N/A 01/01/2018   Procedure: PARTIAL COLECTOMY;  Surgeon: Aviva Signs, MD;  Location: [facility];  Service: General;  Laterality: N/A;  . right hip replacement Right 3 yrs ago    Social History   Socioeconomic History  . Marital status: Married    Spouse name: Not on file  . Number of children: Not on file  . Years of education: Not on file  . Highest education level: Not on file  Occupational History  . Not on file  Tobacco Use  . Smoking status: Light Tobacco Smoker    Types: Cigars  . Smokeless tobacco: Never Used  Substance and Sexual Activity  .  Alcohol use: No  . Drug use: No  . Sexual activity: Not Currently  Other Topics Concern  . Not on file  Social History Narrative  . Not on file   Social Determinants of Health   Financial Resource Strain:   . Difficulty of Paying Living Expenses:   Food Insecurity:   . Worried About Charity fundraiser in the Last Year:   . Arboriculturist in the Last Year:   Transportation Needs:   . Film/video editor (Medical):   Marland Kitchen Lack of Transportation (Non-Medical):   Physical Activity:   . Days of Exercise per Week:   . Minutes of Exercise per Session:    Stress:   . Feeling of Stress :   Social Connections:   . Frequency of Communication with Friends and Family:   . Frequency of Social Gatherings with Friends and Family:   . Attends Religious Services:   . Active Member of Clubs or Organizations:   . Attends Archivist Meetings:   Marland Kitchen Marital Status:   Intimate Partner Violence:   . Fear of Current or Ex-Partner:   . Emotionally Abused:   Marland Kitchen Physically Abused:   . Sexually Abused:     Family History  Problem Relation Age of Onset  . Diabetes Mother   . Dementia Father        Objective: There were no vitals filed for this visit.   Physical Exam  Lab Results:  No results found for this or any previous visit (from the past 24 hour(s)).  BMET No results for input(s): NA, K, CL, CO2, GLUCOSE, BUN, CREATININE, CALCIUM in the last 72 hours. PSA No results found for: PSA No results found for: TESTOSTERONE    Studies/Results: No results found.    Assessment & Plan: No problem-specific Assessment & Plan notes found for this encounter.    No orders of the defined types were placed in this encounter.    No orders of the defined types were placed in this encounter.     No follow-ups on file.   CC: Sharilyn Sites, MD      Irine Seal 07/29/2019

## 2019-07-30 ENCOUNTER — Ambulatory Visit: Payer: Medicare HMO | Admitting: Urology

## 2019-08-12 DIAGNOSIS — Z0001 Encounter for general adult medical examination with abnormal findings: Secondary | ICD-10-CM | POA: Diagnosis not present

## 2019-08-12 DIAGNOSIS — Z6831 Body mass index (BMI) 31.0-31.9, adult: Secondary | ICD-10-CM | POA: Diagnosis not present

## 2019-08-12 DIAGNOSIS — I471 Supraventricular tachycardia: Secondary | ICD-10-CM | POA: Diagnosis not present

## 2019-08-12 DIAGNOSIS — J449 Chronic obstructive pulmonary disease, unspecified: Secondary | ICD-10-CM | POA: Diagnosis not present

## 2019-08-12 DIAGNOSIS — C61 Malignant neoplasm of prostate: Secondary | ICD-10-CM | POA: Diagnosis not present

## 2019-08-12 DIAGNOSIS — Z1389 Encounter for screening for other disorder: Secondary | ICD-10-CM | POA: Diagnosis not present

## 2019-08-12 DIAGNOSIS — I10 Essential (primary) hypertension: Secondary | ICD-10-CM | POA: Diagnosis not present

## 2019-08-12 DIAGNOSIS — R7309 Other abnormal glucose: Secondary | ICD-10-CM | POA: Diagnosis not present

## 2019-08-12 DIAGNOSIS — E7849 Other hyperlipidemia: Secondary | ICD-10-CM | POA: Diagnosis not present

## 2019-08-12 DIAGNOSIS — G4730 Sleep apnea, unspecified: Secondary | ICD-10-CM | POA: Diagnosis not present

## 2019-09-10 ENCOUNTER — Encounter: Payer: Self-pay | Admitting: Urology

## 2019-09-10 ENCOUNTER — Ambulatory Visit: Payer: Medicare HMO | Admitting: Urology

## 2019-09-10 ENCOUNTER — Other Ambulatory Visit: Payer: Self-pay

## 2019-09-10 VITALS — BP 167/82 | HR 97 | Temp 98.3°F | Ht 67.0 in | Wt 216.0 lb

## 2019-09-10 DIAGNOSIS — N138 Other obstructive and reflux uropathy: Secondary | ICD-10-CM

## 2019-09-10 DIAGNOSIS — N5201 Erectile dysfunction due to arterial insufficiency: Secondary | ICD-10-CM | POA: Diagnosis not present

## 2019-09-10 DIAGNOSIS — C61 Malignant neoplasm of prostate: Secondary | ICD-10-CM | POA: Diagnosis not present

## 2019-09-10 DIAGNOSIS — R351 Nocturia: Secondary | ICD-10-CM | POA: Diagnosis not present

## 2019-09-10 DIAGNOSIS — R9721 Rising PSA following treatment for malignant neoplasm of prostate: Secondary | ICD-10-CM | POA: Diagnosis not present

## 2019-09-10 DIAGNOSIS — N401 Enlarged prostate with lower urinary tract symptoms: Secondary | ICD-10-CM

## 2019-09-10 LAB — POCT URINALYSIS DIPSTICK
Bilirubin, UA: NEGATIVE
Glucose, UA: NEGATIVE
Ketones, UA: NEGATIVE
Leukocytes, UA: NEGATIVE
Nitrite, UA: NEGATIVE
Protein, UA: POSITIVE — AB
Spec Grav, UA: 1.015 (ref 1.010–1.025)
Urobilinogen, UA: 0.2 E.U./dL
pH, UA: 6 (ref 5.0–8.0)

## 2019-09-10 MED ORDER — SILODOSIN 8 MG PO CAPS: 8.0000 mg | ORAL_CAPSULE | Freq: Every day | ORAL | 11 refills | Status: DC

## 2019-09-10 NOTE — Progress Notes (Signed)
Subjective:  1. Prostate cancer (Randy Russo)   2. Rising PSA following treatment for malignant neoplasm of prostate   3. BPH with urinary obstruction   4. Nocturia   5. Erectile dysfunction due to arterial insufficiency      09/10/19: [name] returns today in f/u for his history of prostate cancer with a rising PSA. He is 10+ years out from radiation therapy for T2b Gleason prostate cancer with a pretreatment PSA of 16.5. 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 His PSA is 5.3 which is back down from 6.1 at his last visit.  He had a bone scan prior to his last visit that was negative as was his CT scan. He has some frequency and nocturia 2-3x or more and can have urgency with his diuretic.  He has a good stream.  IPSS is 14. He has no hematuria or dysuria. He also has ED.  He has no associated signs or symptoms.     IPSS    Row Name 09/10/19 1500         International Prostate Symptom Score   How often have you had the sensation of not emptying your bladder? Less than 1 in 5     How often have you had to urinate less than every two hours? Less than half the time     How often have you found you stopped and started again several times when you urinated? Less than half the time     How often have you found it difficult to postpone urination? About half the time     How often have you had a weak urinary stream? Less than 1 in 5 times     How often have you had to strain to start urination? Not at All     How many times did you typically get up at night to urinate? 5 Times     Total IPSS Score 14       Quality of Life due to urinary symptoms   If you were to spend the rest of your life with your urinary condition just the way it is now how would you feel about that? Mixed             ROS:  ROS:  A complete review of systems was performed.  All systems are negative except for pertinent findings as noted.   ROS  No Known Allergies  Outpatient Encounter  Medications as of 09/10/2019  Medication Sig  . albuterol (PROVENTIL) (2.5 MG/3ML) 0.083% nebulizer solution Take 2.5 mg by nebulization every 6 (six) hours as needed for wheezing or shortness of breath.  Marland Kitchen albuterol (VENTOLIN HFA) 108 (90 Base) MCG/ACT inhaler Inhale 1-2 puffs into the lungs every 6 (six) hours as needed for wheezing or shortness of breath.  . LUMIGAN 0.01 % SOLN   . Omega-3 Fatty Acids (FISH OIL) 1200 MG CAPS Take 1,200 mg by mouth daily.  Marland Kitchen SPIRIVA HANDIHALER 18 MCG inhalation capsule   . [DISCONTINUED] albuterol (VENTOLIN HFA) 108 (90 Base) MCG/ACT inhaler INHALE 1 TO 2 PUFFS BY MOUTH EVERY 4 HOURS AS NEEDED  . cholecalciferol (VITAMIN D) 400 units TABS tablet Take 400 Units by mouth daily.   . silodosin (RAPAFLO) 8 MG CAPS capsule Take 1 capsule (8 mg total) by mouth daily with breakfast.  . [DISCONTINUED] Omega-3 Fatty Acids (FISH OIL) 1200 MG CAPS Take by mouth.   No facility-administered encounter medications on file as of 09/10/2019.  Past Medical History:  Diagnosis Date  . Arthritis   . Cancer [facility])    Prostate  . COPD (chronic obstructive pulmonary disease) ([place])   . Hypertension     Past Surgical History:  Procedure Laterality Date  . CATARACT EXTRACTION W/PHACO Left 11/16/2012   Procedure: CATARACT EXTRACTION PHACO AND INTRAOCULAR LENS PLACEMENT (IOC);  Surgeon: Tonny Branch, MD;  Location: [facility];  Service: Ophthalmology;  Laterality: Left;  CDE: 13.66  . CATARACT EXTRACTION W/PHACO Right 01/18/2013   Procedure: CATARACT EXTRACTION PHACO AND INTRAOCULAR LENS PLACEMENT (IOC);  Surgeon: Tonny Branch, MD;  Location: [facility];  Service: Ophthalmology;  Laterality: Right;  CDE:8.79  . CERVICAL FUSION  4 yrs ago  . COLONOSCOPY N/A 12/28/2017   Procedure: COLONOSCOPY;  Surgeon: Rogene Houston, MD;  Location: [facility];  Service: Endoscopy;  Laterality: N/A;  . COLONOSCOPY N/A 01/01/2018   Procedure: COLONOSCOPY;  Surgeon: Rogene Houston, MD;  Location: [facility]  END[facility];  Service: Endoscopy;  Laterality: N/A;  . ESOPHAGOGASTRODUODENOSCOPY N/A 01/01/2018   Procedure: ESOPHAGOGASTRODUODENOSCOPY (EGD);  Surgeon: Rogene Houston, MD;  Location[facility];  Service: Endoscopy;  Laterality: N/A;  . JOINT REPLACEMENT Left 5 yrs ago   knee  . PARTIAL COLECTOMY N/A 01/01/2018   Procedure: PARTIAL COLECTOMY;  Surgeon: Aviva Signs, MD;  Location: [facility] OR[facility];  Service: General;  Laterality: N/A;  . right hip replacement Right 3 yrs ago    Social History   Socioeconomic History  . Marital status: Married    Spouse name: Not on file  . Number of children: Not on file  . Years of education: Not on file  . Highest education level: Not on file  Occupational History  . Not on file  Tobacco Use  . Smoking status: Light Tobacco Smoker    Types: Cigars  . Smokeless tobacco: Never Used  Vaping Use  . Vaping Use: Never used  Substance and Sexual Activity  . Alcohol use: No  . Drug use: No  . Sexual activity: Not Currently  Other Topics Concern  . Not on file  Social History Narrative  . Not on file   Social Determinants of Health   Financial Resource Strain:   . Difficulty of Paying Living Expenses:   Food Insecurity:   . Worried About Charity fundraiser in the Last Year:   . Arboriculturist in the Last Year:   Transportation Needs:   . Film/video editor (Medical):   Marland Kitchen Lack of Transportation (Non-Medical):   Physical Activity:   . Days of Exercise per Week:   . Minutes of Exercise per Session:   Stress:   . Feeling of Stress :   Social Connections:   . Frequency of Communication with Friends and Family:   . Frequency of Social Gatherings with Friends and Family:   . Attends Religious Services:   . Active Member of Clubs or Organizations:   . Attends Archivist Meetings:   Marland Kitchen Marital Status:   Intimate Partner Violence:   . Fear of Current or Ex-Partner:   . Emotionally Abused:   Marland Kitchen Physically Abused:   . Sexually  Abused:     Family History  Problem Relation Age of Onset  . Diabetes Mother   . Dementia Father        Objective: Vitals:   09/10/19 1530  BP: (!) 167/82  Pulse: 97  Temp: 98.3 F (36.8 C)     Physical Exam  Lab Results:  Results for orders placed or performed in visit on 09/10/19 (from the past 24 hour(s))  POCT urinalysis dipstick     Status: Abnormal   Collection Time: 09/10/19  3:35 PM  Result Value Ref Range   Color, UA yellow    Clarity, UA     Glucose, UA Negative Negative   Bilirubin, UA neg    Ketones, UA neg    Spec Grav, UA 1.015 1.010 - 1.025   Blood, UA trace intact    pH, UA 6.0 5.0 - 8.0   Protein, UA Positive (A) Negative   Urobilinogen, UA 0.2 0.2 or 1.0 E.U./dL   Nitrite, UA neg    Leukocytes, UA Negative Negative   Appearance clear    Odor      BMET No results for input(s): NA, K, CL, CO2, GLUCOSE, BUN, CREATININE, CALCIUM in the last 72 hours. PSA PSA  Date Value Ref Range Status  07/28/2019 5.3 (H) < OR = 4.0 ng/mL Final    Comment:    The total PSA value from this assay system is  standardized against the WHO standard. The test  result will be approximately 20% lower when compared  to the equimolar-standardized total PSA (Beckman  Coulter). Comparison of serial PSA results should be  interpreted with this fact in mind. . This test was performed using the Siemens  chemiluminescent method. Values obtained from  different assay methods cannot be used interchangeably. PSA levels, regardless of value, should not be interpreted as absolute evidence of the presence or absence of disease.    No results found for: TESTOSTERONE    Studies/Results: No results found.    Assessment & Plan: Prostate cancer with a detectible PSA s/p EXRT remotely.  His PSA is down slightly and his staging late last year was negative.  I will get him back for a PSA and exam in 6 months.  BPH with BOO and nocturia.   I am going try him on silodosin  8mg  and reviewed the side effects.  He will return in 6 weeks for a flowrate and PVR.    Meds ordered this encounter  Medications  . silodosin (RAPAFLO) 8 MG CAPS capsule    Sig: Take 1 capsule (8 mg total) by mouth daily with breakfast.    Dispense:  30 capsule    Refill:  11     Orders Placed This Encounter  Procedures  . PSA    Standing Status:   Future    Standing Expiration Date:   09/09/2020  . POCT urinalysis dipstick      Return in about 6 weeks (around 10/22/2019) for He will need 6 week f/u for a flowrate and PVR.  Needs 6 month f/u for a PSA. Marland Kitchen   CC: Sharilyn Sites, MD      Irine Seal 09/10/2019

## 2019-09-10 NOTE — Progress Notes (Signed)
Urological Symptom Review  Patient is experiencing the following symptoms: Hard to postpone urination Get up at night to urinate Erection problems (male only)   Review of Systems  Gastrointestinal (upper)  : Negative for upper GI symptoms  Gastrointestinal (lower) : Negative for lower GI symptoms  Constitutional : Negative for symptoms  Skin: Negative for skin symptoms  Eyes: Negative for eye symptoms  Ear/Nose/Throat : Sinus problems  Hematologic/Lymphatic: Negative for Hematologic/Lymphatic symptoms  Cardiovascular : Leg swelling  Respiratory : Negative for respiratory symptoms  Endocrine: Negative for endocrine symptoms  Musculoskeletal: Negative for musculoskeletal symptoms  Neurological: Negative for neurological symptoms  Psychologic: Negative for psychiatric symptoms

## 2019-09-16 DIAGNOSIS — H02403 Unspecified ptosis of bilateral eyelids: Secondary | ICD-10-CM | POA: Diagnosis not present

## 2019-09-16 DIAGNOSIS — H401132 Primary open-angle glaucoma, bilateral, moderate stage: Secondary | ICD-10-CM | POA: Diagnosis not present

## 2019-09-16 DIAGNOSIS — Z961 Presence of intraocular lens: Secondary | ICD-10-CM | POA: Diagnosis not present

## 2019-09-16 DIAGNOSIS — H01001 Unspecified blepharitis right upper eyelid: Secondary | ICD-10-CM | POA: Diagnosis not present

## 2019-09-17 DIAGNOSIS — E7849 Other hyperlipidemia: Secondary | ICD-10-CM | POA: Diagnosis not present

## 2019-09-17 DIAGNOSIS — J449 Chronic obstructive pulmonary disease, unspecified: Secondary | ICD-10-CM | POA: Diagnosis not present

## 2019-09-17 DIAGNOSIS — Z6831 Body mass index (BMI) 31.0-31.9, adult: Secondary | ICD-10-CM | POA: Diagnosis not present

## 2019-09-17 DIAGNOSIS — I10 Essential (primary) hypertension: Secondary | ICD-10-CM | POA: Diagnosis not present

## 2019-09-17 DIAGNOSIS — J329 Chronic sinusitis, unspecified: Secondary | ICD-10-CM | POA: Diagnosis not present

## 2019-09-17 DIAGNOSIS — J209 Acute bronchitis, unspecified: Secondary | ICD-10-CM | POA: Diagnosis not present

## 2019-10-21 NOTE — Progress Notes (Signed)
Subjective:  1. BPH with urinary obstruction   2. Nocturia     10/22/19: Randy Russo returns today for voiding studies. He is on sildosoin 8mg  and is voiding better with an IPSS of 2.  His PVR is only 54ml.  He has less edema and remains on a diuretic.    09/10/19: Randy Russo returns today in f/u for his history of prostate cancer with a rising PSA. He is 10+ years out from radiation therapy for T2b Gleason prostate cancer with a pretreatment PSA of 16.5. 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 His PSA is 5.3 which is back down from 6.1 at his last visit.  He had a bone scan prior to his last visit that was negative as was his CT scan. He has some frequency and nocturia 2-3x or more and can have urgency with his diuretic.  He has a good stream.  IPSS is 14. He has no hematuria or dysuria. He also has ED.  He has no associated signs or symptoms.     IPSS    Row Name 10/22/19 1600         International Prostate Symptom Score   How often have you had the sensation of not emptying your bladder? Not at All     How often have you had to urinate less than every two hours? Not at All     How often have you found you stopped and started again several times when you urinated? Not at All     How often have you found it difficult to postpone urination? Not at All     How often have you had a weak urinary stream? Not at All     How often have you had to strain to start urination? Not at All     How many times did you typically get up at night to urinate? 2 Times     Total IPSS Score 2       Quality of Life due to urinary symptoms   If you were to spend the rest of your life with your urinary condition just the way it is now how would you feel about that? Pleased             ROS:  ROS:  A complete review of systems was performed.  All systems are negative except for pertinent findings as noted.   ROS  No Known Allergies  Outpatient Encounter Medications as of  10/22/2019  Medication Sig  . albuterol (PROVENTIL) (2.5 MG/3ML) 0.083% nebulizer solution Take 2.5 mg by nebulization every 6 (six) hours as needed for wheezing or shortness of breath.  Marland Kitchen albuterol (VENTOLIN HFA) 108 (90 Base) MCG/ACT inhaler Inhale 1-2 puffs into the lungs every 6 (six) hours as needed for wheezing or shortness of breath.  . cholecalciferol (VITAMIN D) 400 units TABS tablet Take 400 Units by mouth daily.   Marland Kitchen LUMIGAN 0.01 % SOLN   . Omega-3 Fatty Acids (FISH OIL) 1200 MG CAPS Take 1,200 mg by mouth daily.  . silodosin (RAPAFLO) 8 MG CAPS capsule Take 1 capsule (8 mg total) by mouth daily with breakfast.  . SPIRIVA HANDIHALER 18 MCG inhalation capsule    No facility-administered encounter medications on file as of 10/22/2019.    Past Medical History:  Diagnosis Date  . Arthritis   . Cancer [facility])    Prostate  . COPD (chronic obstructive pulmonary disease) ([place])   . Hypertension  Past Surgical History:  Procedure Laterality Date  . CATARACT EXTRACTION W/PHACO Left 11/16/2012   Procedure: CATARACT EXTRACTION PHACO AND INTRAOCULAR LENS PLACEMENT (IOC);  Surgeon: Tonny Branch, MD;  Location: [facility];  Service: Ophthalmology;  Laterality: Left;  CDE: 13.66  . CATARACT EXTRACTION W/PHACO Right 01/18/2013   Procedure: CATARACT EXTRACTION PHACO AND INTRAOCULAR LENS PLACEMENT (IOC);  Surgeon: Tonny Branch, MD;  Location: [facility];  Service: Ophthalmology;  Laterality: Right;  CDE:8.79  . CERVICAL FUSION  4 yrs ago  . COLONOSCOPY N/A 12/28/2017   Procedure: COLONOSCOPY;  Surgeon: Rogene Houston, MD;  [facility];  Service: Endoscopy;  Laterality: N/A;  . COLONOSCOPY N/A 01/01/2018   Procedure: COLONOSCOPY;  Surgeon: Rogene Houston, MD;  [facility];  Service: Endoscopy;  Laterality: N/A;  . ESOPHAGOGASTRODUODENOSCOPY N/A 01/01/2018   Procedure: ESOPHAGOGASTRODUODENOSCOPY (EGD);  Surgeon: Rogene Houston, MD;  [facility];  Service: Endoscopy;   Laterality: N/A;  . JOINT REPLACEMENT Left 5 yrs ago   knee  . PARTIAL COLECTOMY N/A 01/01/2018   Procedure: PARTIAL COLECTOMY;  Surgeon: Aviva Signs, MD;  Location: [facility];  Service: General;  Laterality: N/A;  . right hip replacement Right 3 yrs ago    Social History   Socioeconomic History  . Marital status: Married    Spouse name: Not on file  . Number of children: Not on file  . Years of education: Not on file  . Highest education level: Not on file  Occupational History  . Not on file  Tobacco Use  . Smoking status: Light Tobacco Smoker    Types: Cigars  . Smokeless tobacco: Never Used  Vaping Use  . Vaping Use: Never used  Substance and Sexual Activity  . Alcohol use: No  . Drug use: No  . Sexual activity: Not Currently  Other Topics Concern  . Not on file  Social History Narrative  . Not on file   Social Determinants of Health   Financial Resource Strain:   . Difficulty of Paying Living Expenses:   Food Insecurity:   . Worried About Charity fundraiser in the Last Year:   . Arboriculturist in the Last Year:   Transportation Needs:   . Film/video editor (Medical):   Marland Kitchen Lack of Transportation (Non-Medical):   Physical Activity:   . Days of Exercise per Week:   . Minutes of Exercise per Session:   Stress:   . Feeling of Stress :   Social Connections:   . Frequency of Communication with Friends and Family:   . Frequency of Social Gatherings with Friends and Family:   . Attends Religious Services:   . Active Member of Clubs or Organizations:   . Attends Archivist Meetings:   Marland Kitchen Marital Status:   Intimate Partner Violence:   . Fear of Current or Ex-Partner:   . Emotionally Abused:   Marland Kitchen Physically Abused:   . Sexually Abused:     Family History  Problem Relation Age of Onset  . Diabetes Mother   . Dementia Father        Objective: Vitals:   10/22/19 1557  BP: 112/68  Pulse: 98     Physical Exam  Lab Results:  No results  found for this or any previous visit (from the past 24 hour(s)).  BMET No results for input(s): NA, K, CL, CO2, GLUCOSE, BUN, CREATININE, CALCIUM in the last 72 hours. PSA PSA  Date Value  Ref Range Status  07/28/2019 5.3 (H) < OR = 4.0 ng/mL Final    Comment:    The total PSA value from this assay system is  standardized against the WHO standard. The test  result will be approximately 20% lower when compared  to the equimolar-standardized total PSA (Beckman  Coulter). Comparison of serial PSA results should be  interpreted with this fact in mind. . This test was performed using the Siemens  chemiluminescent method. Values obtained from  different assay methods cannot be used interchangeably. PSA levels, regardless of value, should not be interpreted as absolute evidence of the presence or absence of disease.    No results found for: TESTOSTERONE    Studies/Results: No results found.    Assessment & Plan: BPH with urinary obstruction He is doing well on silodosin with marked improvement in the LUTS.  His nocturia is down to 1-2 x.   He will return in 4 months with a PSA for f/u of his prostate cancer.       No orders of the defined types were placed in this encounter.    Orders Placed This Encounter  Procedures  . Urinalysis, Routine w reflex microscopic  . PR COMPLEX UROFLOWMETRY  . BLADDER SCAN AMB NON-IMAGING      No follow-ups on file.   CC: Sharilyn Sites, MD      Irine Seal 10/22/2019

## 2019-10-22 ENCOUNTER — Ambulatory Visit (INDEPENDENT_AMBULATORY_CARE_PROVIDER_SITE_OTHER): Payer: Medicare HMO | Admitting: Urology

## 2019-10-22 ENCOUNTER — Encounter: Payer: Self-pay | Admitting: Urology

## 2019-10-22 ENCOUNTER — Other Ambulatory Visit: Payer: Self-pay

## 2019-10-22 VITALS — BP 112/68 | HR 98 | Ht 66.0 in | Wt 220.0 lb

## 2019-10-22 DIAGNOSIS — C61 Malignant neoplasm of prostate: Secondary | ICD-10-CM | POA: Diagnosis not present

## 2019-10-22 DIAGNOSIS — R351 Nocturia: Secondary | ICD-10-CM

## 2019-10-22 DIAGNOSIS — N401 Enlarged prostate with lower urinary tract symptoms: Secondary | ICD-10-CM | POA: Diagnosis not present

## 2019-10-22 DIAGNOSIS — N138 Other obstructive and reflux uropathy: Secondary | ICD-10-CM | POA: Diagnosis not present

## 2019-10-22 LAB — URINALYSIS, ROUTINE W REFLEX MICROSCOPIC
Bilirubin, UA: NEGATIVE
Glucose, UA: NEGATIVE
Leukocytes,UA: NEGATIVE
Nitrite, UA: NEGATIVE
Specific Gravity, UA: >1.03 — ABNORMAL HIGH (ref 1.005–1.030)
Urobilinogen, Ur: 0.2 mg/dL (ref 0.2–1.0)
pH, UA: 5 (ref 5.0–7.5)

## 2019-10-22 LAB — MICROSCOPIC EXAMINATION
Bacteria, UA: NONE SEEN
Renal Epithel, UA: NONE SEEN /hpf

## 2019-10-22 LAB — BLADDER SCAN AMB NON-IMAGING: Scan Result: 29.1

## 2019-10-22 IMAGING — NM NM GI BLOOD LOSS
2 series · 12 of 12 positions shown · non-contrast
Comparison: CT abdomen and pelvis 02/05/2017

CLINICAL DATA: Recurrent GI bleeding

EXAM:
NUCLEAR MEDICINE GASTROINTESTINAL BLEEDING SCAN
TECHNIQUE: Sequential abdominal images were obtained following intravenous
administration of 7c-TTm labeled red blood cells.
RADIOPHARMACEUTICALS:  22.5 mCi 7c-TTm pertechnetate in-vitro
labeled red cells.

[Series 1: gi bleed · 4.14mm/px · 6 of 52 frames shown (1 of 2)]
[frame 5/52]
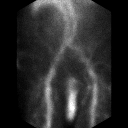
[frame 13/52]
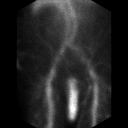
[frame 22/52]
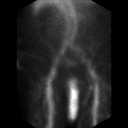
[frame 31/52]
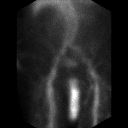
[frame 39/52]
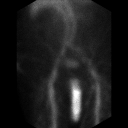
[frame 48/52]
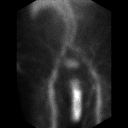

[Series 2: gi bleed · 4.14mm/px · 6 of 60 frames shown (2 of 2)]
[frame 6/60]
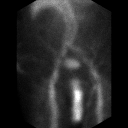
[frame 16/60]
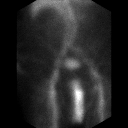
[frame 26/60]
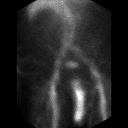
[frame 36/60]
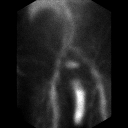
[frame 46/60]
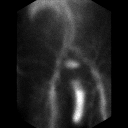
[frame 56/60]
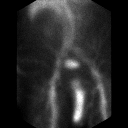

[12 of 12 positions shown; findings below may reference images not displayed]

FINDINGS: Normal blood pool distribution of tracer.

Minimal de labeled tracer within the bladder.

No abnormal gastrointestinal localization of tracer identified to
suggest active GI bleeding.
IMPRESSION: Negative GI bleeding scan.

## 2019-10-22 IMAGING — CT CT HEAD W/O CM
3 series · 14 of 47 positions shown, 16 images · non-contrast
Comparison: Head CT 11/03/2007

CLINICAL DATA: Head trauma. Syncope. Impact to the back of the
head.

EXAM:
CT HEAD WITHOUT CONTRAST
TECHNIQUE: Contiguous axial images were obtained from the base of the skull
through the vertex without intravenous contrast.

[Series 2: head trauma wo · axial · 0.48mm/px · z∈[+1406,+1531]mm · 8 of 30 slices shown, 10 images]
[im 3/30  brain]
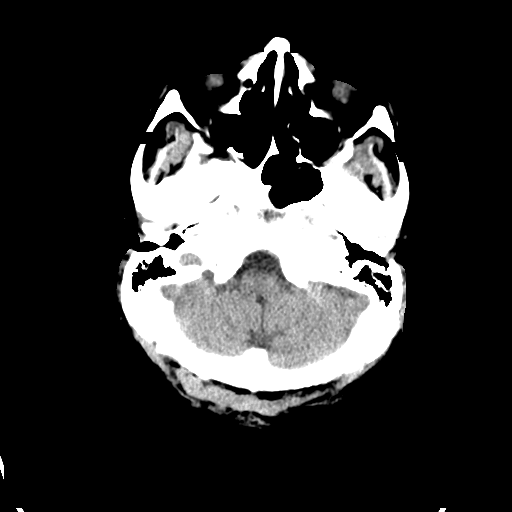
[im 3/30  bone]
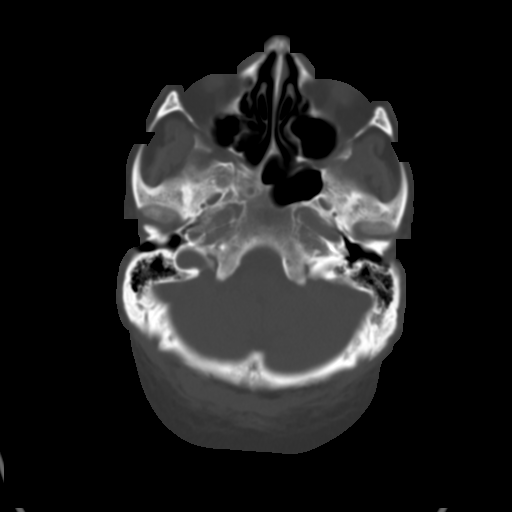
[im 7/30  brain]
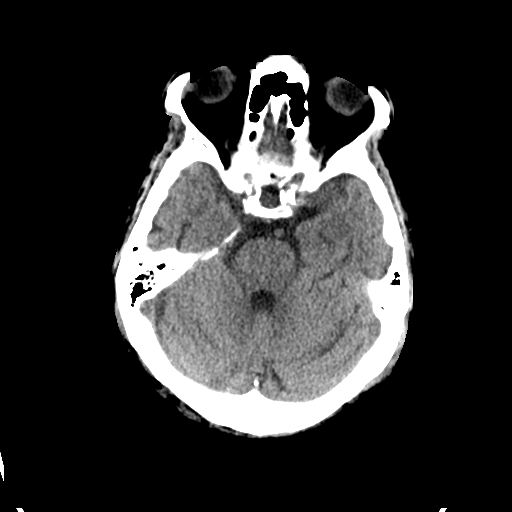
[im 10/30  brain]
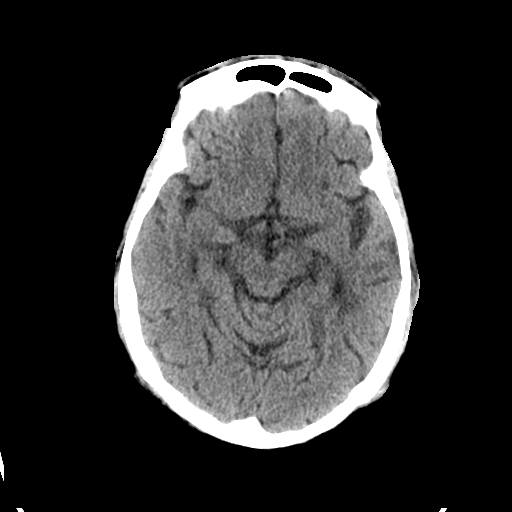
[im 14/30  brain]
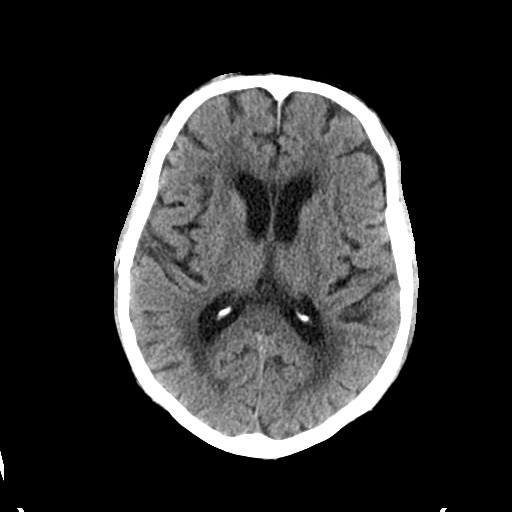
[im 17/30  brain]
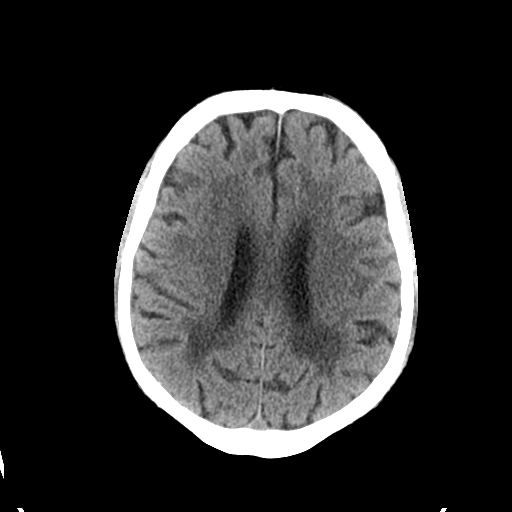
[im 17/30  bone]
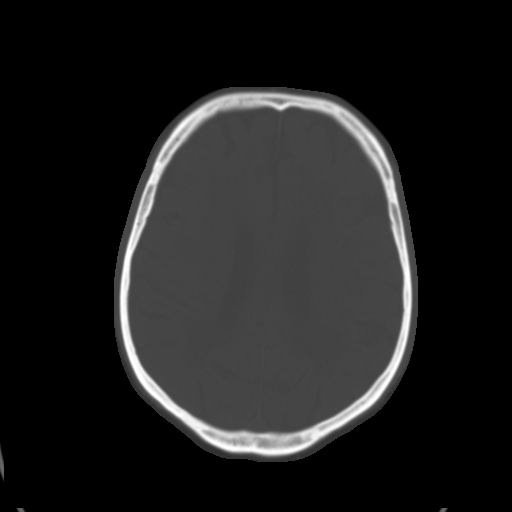
[im 21/30  brain]
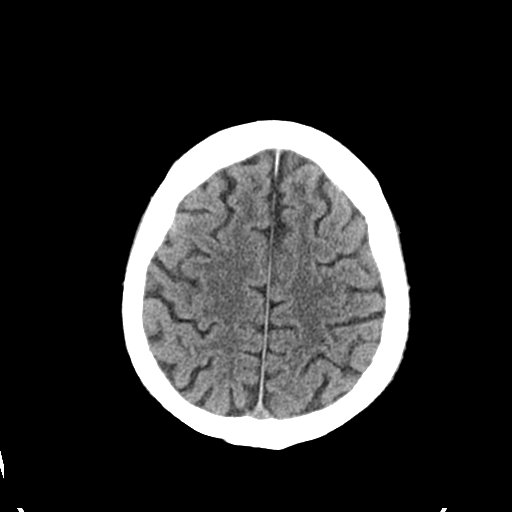
[im 24/30  brain]
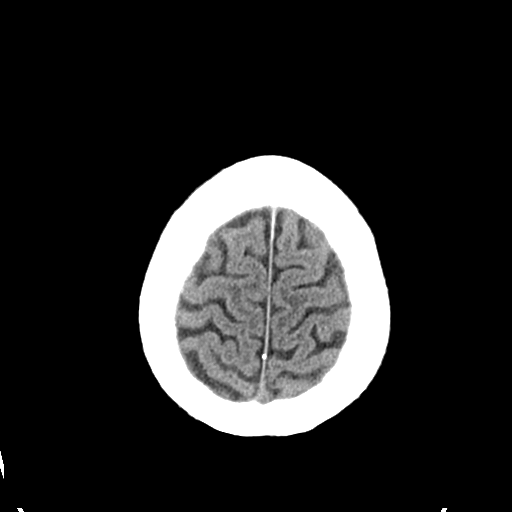
[im 28/30  brain]
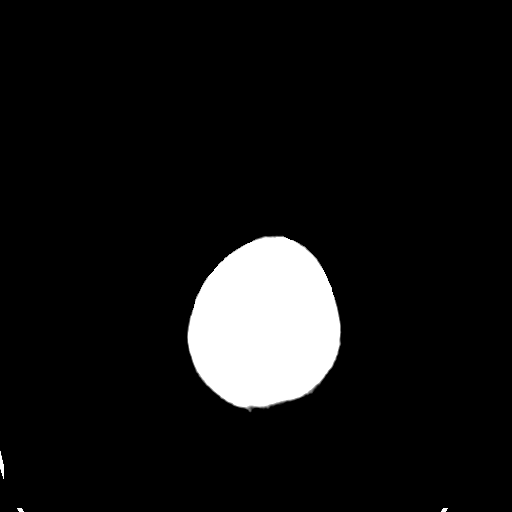

[Series 4: coronal soft tissue · coronal · 0.31mm/px · 3 of 66 slices shown]
[im 22/66  brain]
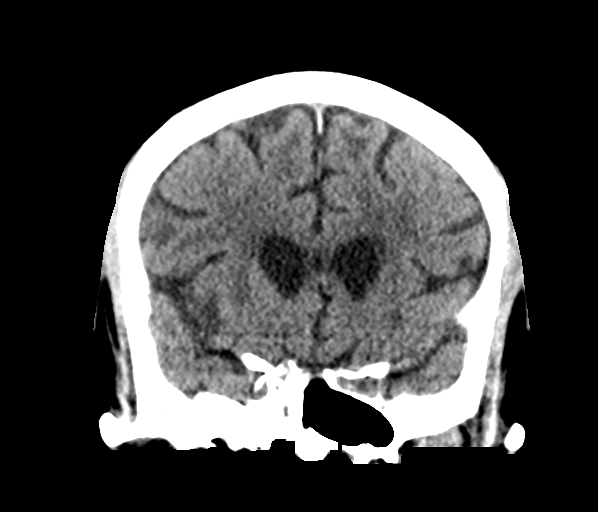
[im 29/66  brain]
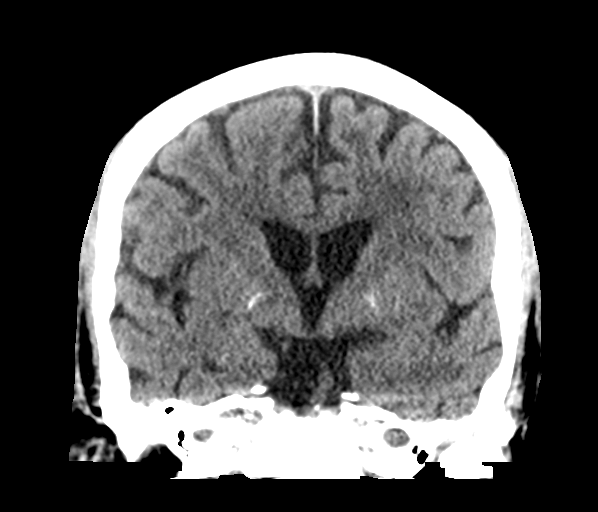
[im 37/66  brain]
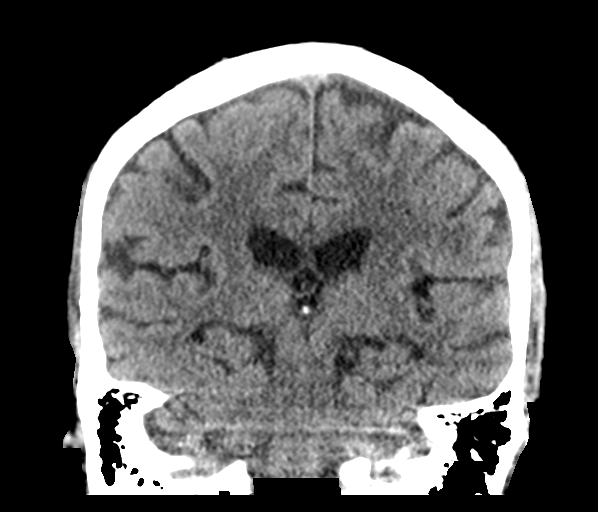

[Series 5: sagittal soft tissue · sagittal · 0.31mm/px · 3 of 54 slices shown]
[im 18/54  brain]
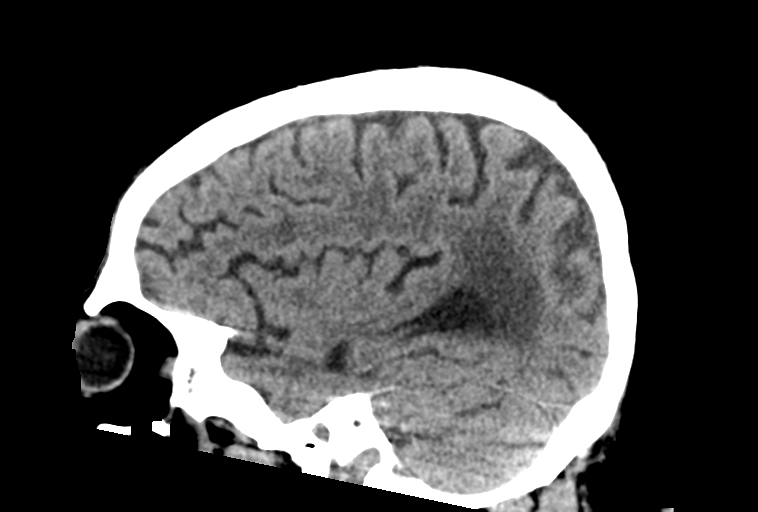
[im 27/54  brain]
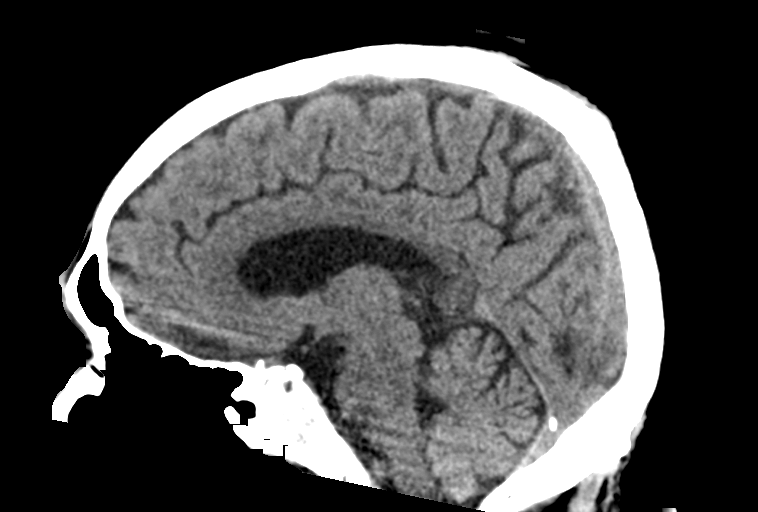
[im 36/54  brain]
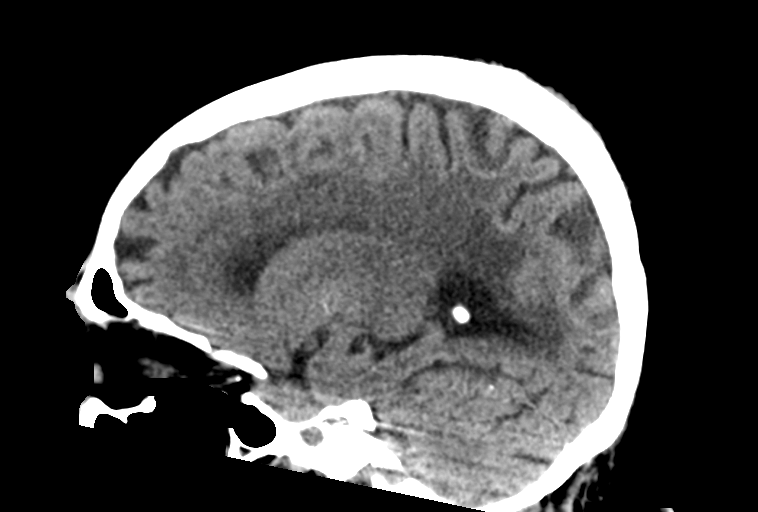

[14 of 47 positions shown; findings below may reference images not displayed]

FINDINGS: Brain: There is no mass, hemorrhage or extra-axial collection. The
size and configuration of the ventricles and extra-axial CSF spaces
are normal. There is no acute or chronic infarction. There is
hypoattenuation of the periventricular white matter, most commonly
indicating chronic ischemic microangiopathy.

Vascular: No abnormal hyperdensity of the major intracranial
arteries or dural venous sinuses. No intracranial atherosclerosis.

Skull: The visualized skull base, calvarium and extracranial soft
tissues are normal.

Sinuses/Orbits: No fluid levels or advanced mucosal thickening of
the visualized paranasal sinuses. No mastoid or middle ear effusion.
The orbits are normal.
IMPRESSION: Chronic ischemic microangiopathy without acute intracranial
abnormality.

## 2019-10-22 NOTE — Progress Notes (Signed)
Uroflow  Peak Flow: 60ml Average Flow: 89ml Voided Volume: 11ml Voiding Time: 2sec Flow Time: 2sec Time to Peak Flow: 1sec  PVR Volume: 29.59ml

## 2019-10-22 NOTE — Progress Notes (Signed)

## 2019-10-22 NOTE — Assessment & Plan Note (Signed)
He is doing well on silodosin with marked improvement in the LUTS.  His nocturia is down to 1-2 x.   He will return in 4 months with a PSA for f/u of his prostate cancer.

## 2019-11-27 ENCOUNTER — Emergency Department (HOSPITAL_COMMUNITY)
Admission: EM | Admit: 2019-11-27 | Discharge: 2019-11-27 | Disposition: A | Discharge status: Home / Self Care | Payer: Medicare HMO

## 2019-12-16 DIAGNOSIS — H02403 Unspecified ptosis of bilateral eyelids: Secondary | ICD-10-CM | POA: Diagnosis not present

## 2019-12-16 DIAGNOSIS — H01001 Unspecified blepharitis right upper eyelid: Secondary | ICD-10-CM | POA: Diagnosis not present

## 2019-12-16 DIAGNOSIS — H401133 Primary open-angle glaucoma, bilateral, severe stage: Secondary | ICD-10-CM | POA: Diagnosis not present

## 2019-12-16 DIAGNOSIS — Z961 Presence of intraocular lens: Secondary | ICD-10-CM | POA: Diagnosis not present

## 2020-01-25 ENCOUNTER — Other Ambulatory Visit: Payer: Self-pay

## 2020-01-25 DIAGNOSIS — C61 Malignant neoplasm of prostate: Secondary | ICD-10-CM

## 2020-02-04 ENCOUNTER — Other Ambulatory Visit: Payer: Medicare HMO

## 2020-02-04 ENCOUNTER — Other Ambulatory Visit: Payer: Self-pay

## 2020-02-04 DIAGNOSIS — C61 Malignant neoplasm of prostate: Secondary | ICD-10-CM

## 2020-02-05 LAB — PSA: Prostate Specific Ag, Serum: 9.9 ng/mL — ABNORMAL HIGH (ref 0.0–4.0)

## 2020-02-10 NOTE — Progress Notes (Signed)
Subjective:  1. Prostate cancer ([place])   2. Rising PSA following treatment for malignant neoplasm of prostate   3. BPH with urinary obstruction   4. Nocturia   5. Erectile dysfunction due to arterial insufficiency        02/11/20 : Randy Russo returns today in f/u for his history of prostate cancer with a rising PSA. He is 10+ years out from radiation therapy for T2b Gleason prostate cancer with a pretreatment PSA of 16.5. 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 His PSA is 9.9 prior to this visit which is up from 5.3 pm 07/28/19.  It was 6.1 a year ago.  He had a bone scan a year ago that was negative as was his CT scan.  He remains on Silodosin and is voiding well with an IPSS of 1-2 with nocturia 1-2x or more and can have urgency with his diuretic.  He has a good stream.  He has no hematuria or dysuria.  He has some pyuria and microhematuria.  He also has ED.  He has no associated signs or symptoms.     IPSS    Row Name 02/11/20 1500         International Prostate Symptom Score   How often have you had the sensation of not emptying your bladder? Not at All     How often have you had to urinate less than every two hours? Not at All     How often have you found you stopped and started again several times when you urinated? Not at All     How often have you found it difficult to postpone urination? Not at All     How often have you had a weak urinary stream? Not at All     How often have you had to strain to start urination? Not at All     How many times did you typically get up at night to urinate? 1 Time     Total IPSS Score 1       Quality of Life due to urinary symptoms   If you were to spend the rest of your life with your urinary condition just the way it is now how would you feel about that? Delighted             ROS:  ROS:  A complete review of systems was performed.  All systems are negative except for pertinent findings as noted.   ROS  No  Known Allergies  Outpatient Encounter Medications as of 02/11/2020  Medication Sig  . albuterol (PROVENTIL) (2.5 MG/3ML) 0.083% nebulizer solution Take 2.5 mg by nebulization every 6 (six) hours as needed for wheezing or shortness of breath.  Marland Kitchen albuterol (VENTOLIN HFA) 108 (90 Base) MCG/ACT inhaler Inhale 1-2 puffs into the lungs every 6 (six) hours as needed for wheezing or shortness of breath.  . cholecalciferol (VITAMIN D) 400 units TABS tablet Take 400 Units by mouth daily.   Marland Kitchen LUMIGAN 0.01 % SOLN   . Omega-3 Fatty Acids (FISH OIL) 1200 MG CAPS Take 1,200 mg by mouth daily.  . silodosin (RAPAFLO) 8 MG CAPS capsule Take 1 capsule (8 mg total) by mouth daily with breakfast.  . SPIRIVA HANDIHALER 18 MCG inhalation capsule    No facility-administered encounter medications on file as of 02/11/2020.    Past Medical History:  Diagnosis Date  . Arthritis   . Cancer [facility])    Prostate  . COPD (chronic  obstructive pulmonary disease) ([employer])   . Hypertension     Past Surgical History:  Procedure Laterality Date  . CATARACT EXTRACTION W/PHACO Left 11/16/2012   Procedure: CATARACT EXTRACTION PHACO AND INTRAOCULAR LENS PLACEMENT (IOC);  Surgeon: Tonny Branch, MD;  Location: [facility];  Service: Ophthalmology;  Laterality: Left;  CDE: 13.66  . CATARACT EXTRACTION W/PHACO Right 01/18/2013   Procedure: CATARACT EXTRACTION PHACO AND INTRAOCULAR LENS PLACEMENT (IOC);  Surgeon: Tonny Branch, MD;  Location: [facility];  Service: Ophthalmology;  Laterality: Right;  CDE:8.79  . CERVICAL FUSION  4 yrs ago  . COLONOSCOPY N/A 12/28/2017   Procedure: COLONOSCOPY;  Surgeon: Rogene Houston, MD;  Location: [facility];  Service: Endoscopy;  Laterality: N/A;  . COLONOSCOPY N/A 01/01/2018   Procedure: COLONOSCOPY;  Surgeon: Rogene Houston, MD;  Location: [facility];  Service: Endoscopy;  Laterality: N/A;  . ESOPHAGOGASTRODUODENOSCOPY N/A 01/01/2018   Procedure: ESOPHAGOGASTRODUODENOSCOPY (EGD);  Surgeon: Rogene Houston, MD;  Location: [facility];  Service: Endoscopy;  Laterality: N/A;  . JOINT REPLACEMENT Left 5 yrs ago   knee  . PARTIAL COLECTOMY N/A 01/01/2018   Procedure: PARTIAL COLECTOMY;  Surgeon: Aviva Signs, MD;  Location: [facility];  Service: General;  Laterality: N/A;  . right hip replacement Right 3 yrs ago    Social History   Socioeconomic History  . Marital status: Married    Spouse name: Not on file  . Number of children: Not on file  . Years of education: Not on file  . Highest education level: Not on file  Occupational History  . Not on file  Tobacco Use  . Smoking status: Light Tobacco Smoker    Types: Cigars  . Smokeless tobacco: Never Used  Vaping Use  . Vaping Use: Never used  Substance and Sexual Activity  . Alcohol use: No  . Drug use: No  . Sexual activity: Not Currently  Other Topics Concern  . Not on file  Social History Narrative  . Not on file   Social Determinants of Health   Financial Resource Strain:   . Difficulty of Paying Living Expenses: Not on file  Food Insecurity:   . Worried About Charity fundraiser in the Last Year: Not on file  . Ran Out of Food in the Last Year: Not on file  Transportation Needs:   . Lack of Transportation (Medical): Not on file  . Lack of Transportation (Non-Medical): Not on file  Physical Activity:   . Days of Exercise per Week: Not on file  . Minutes of Exercise per Session: Not on file  Stress:   . Feeling of Stress : Not on file  Social Connections:   . Frequency of Communication with Friends and Family: Not on file  . Frequency of Social Gatherings with Friends and Family: Not on file  . Attends Religious Services: Not on file  . Active Member of Clubs or Organizations: Not on file  . Attends Archivist Meetings: Not on file  . Marital Status: Not on file  Intimate Partner Violence:   . Fear of Current or Ex-Partner: Not on file  . Emotionally Abused: Not on file  . Physically Abused: Not  on file  . Sexually Abused: Not on file    Family History  Problem Relation Age of Onset  . Diabetes Mother   . Dementia Father        Objective: Vitals:   02/11/20 1454  BP: (!) 164/75  Pulse: Marland Kitchen)  105  Temp: 99.3 F (37.4 C)     Physical Exam Lymphadenopathy:     Cervical:     Right cervical: No superficial cervical adenopathy.    Left cervical: No superficial cervical adenopathy.     Upper Body:     Right upper body: No supraclavicular or axillary adenopathy.     Left upper body: Supraclavicular adenopathy (small and firm) present. No axillary adenopathy.     Lab Results:  Results for orders placed or performed in visit on 02/11/20 (from the past 24 hour(s))  Urinalysis, Routine w reflex microscopic     Status: Abnormal   Collection Time: 02/11/20  2:55 PM  Result Value Ref Range   Specific Gravity, UA 1.020 1.005 - 1.030   pH, UA 5.0 5.0 - 7.5   Color, UA Yellow Yellow   Appearance Ur Clear Clear   Leukocytes,UA 1+ (A) Negative   Protein,UA Negative Negative/Trace   Glucose, UA Negative Negative   Ketones, UA Negative Negative   RBC, UA 2+ (A) Negative   Bilirubin, UA Negative Negative   Urobilinogen, Ur 0.2 0.2 - 1.0 mg/dL   Nitrite, UA Negative Negative   Microscopic Examination See below:    Narrative   Performed at:  [facility] [address] [place], [place]  [id number] Lab Director: Mina Marble [place], Phone:  [phone]  Microscopic Examination     Status: Abnormal   Collection Time: 02/11/20  2:55 PM   Urine  Result Value Ref Range   WBC, UA 11-30 (A) 0 - 5 /hpf   RBC 3-10 (A) 0 - 2 /hpf   Epithelial Cells (non renal) 0-10 0 - 10 /hpf   Renal Epithel, UA None seen None seen /hpf   Bacteria, UA Few (A) None seen/Few   Narrative   Performed at:  [facility] [address], [place], [place]  [id number] Lab Director: [place], Phone:  [phone]    BMET No results for input(s): NA, K, CL, CO2,  GLUCOSE, BUN, CREATININE, CALCIUM in the last 72 hours. PSA PSA  Date Value Ref Range Status  07/28/2019 5.3 (H) < OR = 4.0 ng/mL Final    Comment:    The total PSA value from this assay system is  standardized against the WHO standard. The test  result will be approximately 20% lower when compared  to the equimolar-standardized total PSA (Beckman  Coulter). Comparison of serial PSA results should be  interpreted with this fact in mind. . This test was performed using the Siemens  chemiluminescent method. Values obtained from  different assay methods cannot be used interchangeably. PSA levels, regardless of value, should not be interpreted as absolute evidence of the presence or absence of disease.    No results found for: TESTOSTERONE    Studies/Results: Recent Results (from the past 2160 hour(s))  PSA     Status: Abnormal   Collection Time: 02/04/20  2:33 PM  Result Value Ref Range   Prostate Specific Ag, Serum 9.9 (H) 0.0 - 4.0 ng/mL    Comment: Roche ECLIA methodology. According to the American Urological Association, Serum PSA should decrease and remain at undetectable levels after radical prostatectomy. The AUA defines biochemical recurrence as an initial PSA value 0.2 ng/mL or greater followed by a subsequent confirmatory PSA value 0.2 ng/mL or greater. Values obtained with different assay methods or kits cannot be used interchangeably. Results cannot be interpreted as absolute evidence of the presence or absence of malignant  disease.   Urinalysis, Routine w reflex microscopic     Status: Abnormal   Collection Time: 02/11/20  2:55 PM  Result Value Ref Range   Specific Gravity, UA 1.020 1.005 - 1.030   pH, UA 5.0 5.0 - 7.5   Color, UA Yellow Yellow   Appearance Ur Clear Clear   Leukocytes,UA 1+ (A) Negative   Protein,UA Negative Negative/Trace   Glucose, UA Negative Negative   Ketones, UA Negative Negative   RBC, UA 2+ (A) Negative   Bilirubin, UA Negative  Negative   Urobilinogen, Ur 0.2 0.2 - 1.0 mg/dL   Nitrite, UA Negative Negative   Microscopic Examination See below:   Microscopic Examination     Status: Abnormal   Collection Time: 02/11/20  2:55 PM   Urine  Result Value Ref Range   WBC, UA 11-30 (A) 0 - 5 /hpf   RBC 3-10 (A) 0 - 2 /hpf   Epithelial Cells (non renal) 0-10 0 - 10 /hpf   Renal Epithel, UA None seen None seen /hpf   Bacteria, UA Few (A) None seen/Few       Assessment & Plan: Prostate cancer with a further rise in the PSA from 5.3 to 9.9 over the last 6 months.   I am going to get him set up for a PSMA PET for further staging.   BPH with BOO.  He will continue the sildosin.    He has a possible UTI today with pyuria and microhematuria.  I will send a culture.      No orders of the defined types were placed in this encounter.    Orders Placed This Encounter  Procedures  . Microscopic Examination  . NM PET ([id number]) SKULL TO MID THIGH    Standing Status:   Future    Standing Expiration Date:   03/12/2020    Order Specific Question:   If indicated for the ordered procedure, I authorize the administration of a radiopharmaceutical per Radiology protocol    Answer:   Yes    Order Specific Question:   Preferred imaging location?    Answer:   [facility]  . Urinalysis, Routine w reflex microscopic      Return in about 4 weeks (around 03/10/2020) for He needs a in person or telehealth visit to discuss the results of the PET scan when available. .   CC: Sharilyn Sites, MD      Irine Seal 02/11/2020

## 2020-02-11 ENCOUNTER — Encounter: Payer: Self-pay | Admitting: Urology

## 2020-02-11 ENCOUNTER — Ambulatory Visit (INDEPENDENT_AMBULATORY_CARE_PROVIDER_SITE_OTHER): Payer: Medicare HMO | Admitting: Urology

## 2020-02-11 ENCOUNTER — Other Ambulatory Visit: Payer: Self-pay

## 2020-02-11 VITALS — BP 164/75 | HR 105 | Temp 99.3°F | Ht 66.0 in | Wt 200.0 lb

## 2020-02-11 DIAGNOSIS — R9721 Rising PSA following treatment for malignant neoplasm of prostate: Secondary | ICD-10-CM

## 2020-02-11 DIAGNOSIS — N5201 Erectile dysfunction due to arterial insufficiency: Secondary | ICD-10-CM | POA: Diagnosis not present

## 2020-02-11 DIAGNOSIS — N401 Enlarged prostate with lower urinary tract symptoms: Secondary | ICD-10-CM | POA: Diagnosis not present

## 2020-02-11 DIAGNOSIS — N138 Other obstructive and reflux uropathy: Secondary | ICD-10-CM | POA: Diagnosis not present

## 2020-02-11 DIAGNOSIS — R351 Nocturia: Secondary | ICD-10-CM

## 2020-02-11 DIAGNOSIS — C61 Malignant neoplasm of prostate: Secondary | ICD-10-CM | POA: Diagnosis not present

## 2020-02-11 DIAGNOSIS — N390 Urinary tract infection, site not specified: Secondary | ICD-10-CM | POA: Diagnosis not present

## 2020-02-11 LAB — URINALYSIS, ROUTINE W REFLEX MICROSCOPIC
Bilirubin, UA: NEGATIVE
Glucose, UA: NEGATIVE
Ketones, UA: NEGATIVE
Nitrite, UA: NEGATIVE
Protein,UA: NEGATIVE
Specific Gravity, UA: 1.02 (ref 1.005–1.030)
Urobilinogen, Ur: 0.2 mg/dL (ref 0.2–1.0)
pH, UA: 5 (ref 5.0–7.5)

## 2020-02-11 LAB — MICROSCOPIC EXAMINATION: Renal Epithel, UA: NONE SEEN /hpf

## 2020-02-11 NOTE — Progress Notes (Signed)

## 2020-02-14 DIAGNOSIS — Z23 Encounter for immunization: Secondary | ICD-10-CM | POA: Diagnosis not present

## 2020-02-16 ENCOUNTER — Telehealth: Payer: Self-pay

## 2020-02-16 DIAGNOSIS — N390 Urinary tract infection, site not specified: Secondary | ICD-10-CM

## 2020-02-16 LAB — URINE CULTURE

## 2020-02-16 MED ORDER — CEPHALEXIN 500 MG PO CAPS: 500.0000 mg | ORAL_CAPSULE | Freq: Three times a day (TID) | ORAL | 0 refills | Status: AC

## 2020-02-16 NOTE — Telephone Encounter (Signed)
-----   Message from Irine Seal, MD sent at 02/16/2020  8:22 AM EST ----- Keflex 500mg  po tid #21.

## 2020-02-16 NOTE — Telephone Encounter (Signed)
Notified of antibiotic sent to pharmacy.

## 2020-02-16 NOTE — Telephone Encounter (Signed)
ABT sent in. Message left for pt to return call to office.

## 2020-03-22 DIAGNOSIS — H01001 Unspecified blepharitis right upper eyelid: Secondary | ICD-10-CM | POA: Diagnosis not present

## 2020-03-22 DIAGNOSIS — H01002 Unspecified blepharitis right lower eyelid: Secondary | ICD-10-CM | POA: Diagnosis not present

## 2020-03-22 DIAGNOSIS — H02403 Unspecified ptosis of bilateral eyelids: Secondary | ICD-10-CM | POA: Diagnosis not present

## 2020-03-22 DIAGNOSIS — H401133 Primary open-angle glaucoma, bilateral, severe stage: Secondary | ICD-10-CM | POA: Diagnosis not present

## 2020-06-26 DIAGNOSIS — H401133 Primary open-angle glaucoma, bilateral, severe stage: Secondary | ICD-10-CM | POA: Diagnosis not present

## 2020-06-26 DIAGNOSIS — H18413 Arcus senilis, bilateral: Secondary | ICD-10-CM | POA: Diagnosis not present

## 2020-12-06 DIAGNOSIS — H01001 Unspecified blepharitis right upper eyelid: Secondary | ICD-10-CM | POA: Diagnosis not present

## 2020-12-06 DIAGNOSIS — H401133 Primary open-angle glaucoma, bilateral, severe stage: Secondary | ICD-10-CM | POA: Diagnosis not present

## 2020-12-06 DIAGNOSIS — H02403 Unspecified ptosis of bilateral eyelids: Secondary | ICD-10-CM | POA: Diagnosis not present

## 2020-12-06 DIAGNOSIS — H01002 Unspecified blepharitis right lower eyelid: Secondary | ICD-10-CM | POA: Diagnosis not present

## 2020-12-13 DIAGNOSIS — Z125 Encounter for screening for malignant neoplasm of prostate: Secondary | ICD-10-CM | POA: Diagnosis not present

## 2020-12-13 DIAGNOSIS — I471 Supraventricular tachycardia: Secondary | ICD-10-CM | POA: Diagnosis not present

## 2020-12-13 DIAGNOSIS — E669 Obesity, unspecified: Secondary | ICD-10-CM | POA: Diagnosis not present

## 2020-12-13 DIAGNOSIS — E782 Mixed hyperlipidemia: Secondary | ICD-10-CM | POA: Diagnosis not present

## 2020-12-13 DIAGNOSIS — Z0001 Encounter for general adult medical examination with abnormal findings: Secondary | ICD-10-CM | POA: Diagnosis not present

## 2020-12-13 DIAGNOSIS — G4730 Sleep apnea, unspecified: Secondary | ICD-10-CM | POA: Diagnosis not present

## 2020-12-13 DIAGNOSIS — Z23 Encounter for immunization: Secondary | ICD-10-CM | POA: Diagnosis not present

## 2020-12-13 DIAGNOSIS — M1712 Unilateral primary osteoarthritis, left knee: Secondary | ICD-10-CM | POA: Diagnosis not present

## 2020-12-13 DIAGNOSIS — C61 Malignant neoplasm of prostate: Secondary | ICD-10-CM | POA: Diagnosis not present

## 2020-12-13 DIAGNOSIS — J449 Chronic obstructive pulmonary disease, unspecified: Secondary | ICD-10-CM | POA: Diagnosis not present

## 2020-12-28 ENCOUNTER — Encounter: Payer: Self-pay | Admitting: Urology

## 2020-12-28 ENCOUNTER — Other Ambulatory Visit: Payer: Self-pay

## 2020-12-28 ENCOUNTER — Ambulatory Visit (INDEPENDENT_AMBULATORY_CARE_PROVIDER_SITE_OTHER): Payer: Medicare HMO | Admitting: Urology

## 2020-12-28 VITALS — BP 140/85 | HR 72 | Temp 98.7°F | Wt 187.0 lb

## 2020-12-28 DIAGNOSIS — C61 Malignant neoplasm of prostate: Secondary | ICD-10-CM | POA: Diagnosis not present

## 2020-12-28 DIAGNOSIS — K4090 Unilateral inguinal hernia, without obstruction or gangrene, not specified as recurrent: Secondary | ICD-10-CM | POA: Diagnosis not present

## 2020-12-28 DIAGNOSIS — N401 Enlarged prostate with lower urinary tract symptoms: Secondary | ICD-10-CM | POA: Diagnosis not present

## 2020-12-28 DIAGNOSIS — R351 Nocturia: Secondary | ICD-10-CM

## 2020-12-28 DIAGNOSIS — R9721 Rising PSA following treatment for malignant neoplasm of prostate: Secondary | ICD-10-CM | POA: Diagnosis not present

## 2020-12-28 DIAGNOSIS — N138 Other obstructive and reflux uropathy: Secondary | ICD-10-CM | POA: Diagnosis not present

## 2020-12-28 LAB — URINALYSIS, ROUTINE W REFLEX MICROSCOPIC
Bilirubin, UA: NEGATIVE
Glucose, UA: NEGATIVE
Ketones, UA: NEGATIVE
Leukocytes,UA: NEGATIVE
Nitrite, UA: NEGATIVE
Specific Gravity, UA: 1.025 (ref 1.005–1.030)
Urobilinogen, Ur: 0.2 mg/dL (ref 0.2–1.0)
pH, UA: 5.5 (ref 5.0–7.5)

## 2020-12-28 LAB — MICROSCOPIC EXAMINATION
Bacteria, UA: NONE SEEN
Renal Epithel, UA: NONE SEEN /hpf
WBC, UA: NONE SEEN /hpf (ref 0–5)

## 2020-12-28 MED ORDER — SILODOSIN 8 MG PO CAPS: 8.0000 mg | ORAL_CAPSULE | Freq: Every day | ORAL | 11 refills | Status: DC

## 2020-12-28 NOTE — Progress Notes (Signed)
Urological Symptom Review  Patient is experiencing the following symptoms: Get up at night to urinate Erection problems (male only) Penile pain (male only)    Review of Systems  Gastrointestinal (upper)  : Negative for upper GI symptoms  Gastrointestinal (lower) : Negative for lower GI symptoms  Constitutional : Negative for symptoms  Skin: Negative for skin symptoms  Eyes: Negative for eye symptoms  Ear/Nose/Throat : Negative for Ear/Nose/Throat symptoms  Hematologic/Lymphatic: Negative for Hematologic/Lymphatic symptoms  Cardiovascular : Negative for cardiovascular symptoms  Respiratory : Negative for respiratory symptoms  Endocrine: Negative for endocrine symptoms  Musculoskeletal: Joint pain  Neurological: Negative for neurological symptoms  Psychologic: Negative for psychiatric symptoms

## 2020-12-28 NOTE — Progress Notes (Signed)
Subjective:  1. Prostate cancer ([place])   2. Rising PSA following treatment for malignant neoplasm of prostate   3. BPH with urinary obstruction   4. Nocturia   5. Non-recurrent unilateral inguinal hernia without obstruction or gangrene        02/11/20 : Randy Russo returns today in f/u for his history of prostate cancer with a rising PSA. He is 10+ years out from radiation therapy for T2b Gleason prostate cancer with a pretreatment PSA of 16.5. 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 His PSA is 9.9 prior to this visit which is up from 5.3 pm 07/28/19.  It was 6.1 a year ago.  He had a bone scan a year ago that was negative as was his CT scan.  He remains on Silodosin and is voiding well with an IPSS of 1-2 with nocturia 1-2x or more and can have urgency with his diuretic.  He has a good stream.  He has no hematuria or dysuria.  He has some pyuria and microhematuria.  He also has ED.  He has no associated signs or symptoms.     IPSS     Row Name 12/28/20 1400         International Prostate Symptom Score   How often have you had the sensation of not emptying your bladder? Not at All     How often have you had to urinate less than every two hours? Less than half the time     How often have you found you stopped and started again several times when you urinated? Not at All     How often have you found it difficult to postpone urination? Not at All     How often have you had a weak urinary stream? Not at All     How often have you had to strain to start urination? Not at All     How many times did you typically get up at night to urinate? 2 Times     Total IPSS Score 4           Quality of Life due to urinary symptoms   If you were to spend the rest of your life with your urinary condition just the way it is now how would you feel about that? Delighted            12/28/20: Randy Russo returns today for the history noted above.   He was last seen in 11/21 with a  rising PSA and a prior history of prostate cancer as noted above.   His PSA on 12/13/20 with Dr. Hilma Favors was down slightly to 8.7.  He remains on silodosin for LUTS and his IPSS is only 4 with nocturia and frequency.  He has lost about 50lb over the last 6 months.   He has some pain in the right scrotum and has some swelling in the groin.  He saw Dr. Hilma Favors for that and was sent back over here.  Marland KitchenHe had a CT in 12/20 that showed an umbilical hernia but he had the beginnings of a right inguinal hernia as well.        ROS:  ROS:  A complete review of systems was performed.  All systems are negative except for pertinent findings as noted.   ROS  No Known Allergies  Outpatient Encounter Medications as of 12/28/2020  Medication Sig   albuterol (PROVENTIL) (2.5 MG/3ML) 0.083% nebulizer solution Take 2.5 mg by nebulization every  6 (six) hours as needed for wheezing or shortness of breath.   albuterol (VENTOLIN HFA) 108 (90 Base) MCG/ACT inhaler Inhale 1-2 puffs into the lungs every 6 (six) hours as needed for wheezing or shortness of breath.   cholecalciferol (VITAMIN D) 400 units TABS tablet Take 400 Units by mouth daily.    LUMIGAN 0.01 % SOLN    Omega-3 Fatty Acids (FISH OIL) 1200 MG CAPS Take 1,200 mg by mouth daily.   SPIRIVA HANDIHALER 18 MCG inhalation capsule    [DISCONTINUED] silodosin (RAPAFLO) 8 MG CAPS capsule Take 1 capsule (8 mg total) by mouth daily with breakfast.   silodosin (RAPAFLO) 8 MG CAPS capsule Take 1 capsule (8 mg total) by mouth daily with breakfast.   No facility-administered encounter medications on file as of 12/28/2020.    Past Medical History:  Diagnosis Date   Arthritis    Cancer [facility])    Prostate   COPD (chronic obstructive pulmonary disease) ([place])    Hypertension     Past Surgical History:  Procedure Laterality Date   CATARACT EXTRACTION W/PHACO Left 11/16/2012   Procedure: CATARACT EXTRACTION PHACO AND INTRAOCULAR LENS PLACEMENT ([place]);  Surgeon: Tonny Branch, MD;  Location: [facility];  Service: Ophthalmology;  Laterality: Left;  CDE: 13.66   CATARACT EXTRACTION W/PHACO Right 01/18/2013   Procedure: CATARACT EXTRACTION PHACO AND INTRAOCULAR LENS PLACEMENT (IOC);  Surgeon: Tonny Branch, MD;  Location: [facility]S;  Service: Ophthalmology;  Laterality: Right;  CDE:8.79   CERVICAL FUSION  4 yrs ago   COLONOSCOPY N/A 12/28/2017   Procedure: COLONOSCOPY;  Surgeon: Rogene Houston, MD;  Location: [facility];  Service: Endoscopy;  Laterality: N/A;   COLONOSCOPY N/A 01/01/2018   Procedure: COLONOSCOPY;  Surgeon: Rogene Houston, MD;  Location: [facility];  Service: Endoscopy;  Laterality: N/A;   ESOPHAGOGASTRODUODENOSCOPY N/A 01/01/2018   Procedure: ESOPHAGOGASTRODUODENOSCOPY (EGD);  Surgeon: Rogene Houston, MD;  Location: [facility];  Service: Endoscopy;  Laterality: N/A;   JOINT REPLACEMENT Left 5 yrs ago   knee   PARTIAL COLECTOMY N/A 01/01/2018   Procedure: PARTIAL COLECTOMY;  Surgeon: Aviva Signs, MD;  Location: [facility];  Service: General;  Laterality: N/A;   right hip replacement Right 3 yrs ago    Social History   Socioeconomic History   Marital status: Married    Spouse name: Not on file   Number of children: Not on file   Years of education: Not on file   Highest education level: Not on file  Occupational History   Not on file  Tobacco Use   Smoking status: Light Smoker    Types: Cigars   Smokeless tobacco: Never  Vaping Use   Vaping Use: Never used  Substance and Sexual Activity   Alcohol use: No   Drug use: No   Sexual activity: Not Currently  Other Topics Concern   Not on file  Social History Narrative   Not on file   Social Determinants of Health   Financial Resource Strain: Not on file  Food Insecurity: Not on file  Transportation Needs: Not on file  Physical Activity: Not on file  Stress: Not on file  Social Connections: Not on file  Intimate Partner Violence: Not on file    Family History  Problem  Relation Age of Onset   Diabetes Mother    Dementia Father        Objective: Vitals:   12/28/20 1403  BP: 140/85  Pulse: 72  Temp: 98.7  F (37.1 C)     Physical Exam Genitourinary:    Comments: Uncirc phallus with adequate meatus. Scrotum has normal skin but there is a large mass on the right that extends to the inguinal canal and feels most consistent with an inguinal hernia.  The testicle is not palpable on the right but the left is normal.   AP without lesions. NST without mass. Prostate is smooth and flat without lesions. SV Non-palpable.  Lymphadenopathy:     Cervical:     Right cervical: No superficial cervical adenopathy.    Left cervical: No superficial cervical adenopathy.     Upper Body:     Right upper body: No supraclavicular or axillary adenopathy.     Left upper body: Supraclavicular adenopathy (small and firm but larger than on the previous exam) present. No axillary adenopathy.    Lab Results:  Results for orders placed or performed in visit on 12/28/20 (from the past 24 hour(s))  Urinalysis, Routine w reflex microscopic     Status: Abnormal   Collection Time: 12/28/20  2:08 PM  Result Value Ref Range   Specific Gravity, UA 1.025 1.005 - 1.030   pH, UA 5.5 5.0 - 7.5   Color, UA Yellow Yellow   Appearance Ur Clear Clear   Leukocytes,UA Negative Negative   Protein,UA Trace (A) Negative/Trace   Glucose, UA Negative Negative   Ketones, UA Negative Negative   RBC, UA 1+ (A) Negative   Bilirubin, UA Negative Negative   Urobilinogen, Ur 0.2 0.2 - 1.0 mg/dL   Nitrite, UA Negative Negative   Microscopic Examination See below:    Narrative   Performed at:  [facility] [address] [place], [place]  [id number] Lab Director: Mina Marble MT, Phone:  [phone]  Microscopic Examination     Status: Abnormal   Collection Time: 12/28/20  2:08 PM   Urine  Result Value Ref Range   WBC, UA None seen 0 - 5 /hpf   RBC 3-10 (A) 0 - 2 /hpf    Epithelial Cells (non renal) 0-10 0 - 10 /hpf   Renal Epithel, UA None seen None seen /hpf   Mucus, UA Present Not Estab.   Bacteria, UA None seen None seen/Few   Narrative   Performed at:  [facility] [address], [place], [place]  [id number] Lab Director: [place], Phone:  [phone]     BMET No results for input(s): NA, K, CL, CO2, GLUCOSE, BUN, CREATININE, CALCIUM in the last 72 hours. PSA PSA  Date Value Ref Range Status  07/28/2019 5.3 (H) < OR = 4.0 ng/mL Final    Comment:    The total PSA value from this assay system is  standardized against the WHO standard. The test  result will be approximately 20% lower when compared  to the equimolar-standardized total PSA (Beckman  Coulter). Comparison of serial PSA results should be  interpreted with this fact in mind. . This test was performed using the Siemens  chemiluminescent method. Values obtained from  different assay methods cannot be used interchangeably. PSA levels, regardless of value, should not be interpreted as absolute evidence of the presence or absence of disease.    No results found for: TESTOSTERONE  UA reviewed and PSA reviewed.   Studies/Results: Recent Results (from the past 2160 hour(s))  Urinalysis, Routine w reflex microscopic     Status: Abnormal   Collection Time: 12/28/20  2:08 PM  Result Value Ref Range  Specific Gravity, UA 1.025 1.005 - 1.030   pH, UA 5.5 5.0 - 7.5   Color, UA Yellow Yellow   Appearance Ur Clear Clear   Leukocytes,UA Negative Negative   Protein,UA Trace (A) Negative/Trace   Glucose, UA Negative Negative   Ketones, UA Negative Negative   RBC, UA 1+ (A) Negative   Bilirubin, UA Negative Negative   Urobilinogen, Ur 0.2 0.2 - 1.0 mg/dL   Nitrite, UA Negative Negative   Microscopic Examination See below:   Microscopic Examination     Status: Abnormal   Collection Time: 12/28/20  2:08 PM   Urine  Result Value Ref Range   WBC, UA None seen 0 -  5 /hpf   RBC 3-10 (A) 0 - 2 /hpf   Epithelial Cells (non renal) 0-10 0 - 10 /hpf   Renal Epithel, UA None seen None seen /hpf   Mucus, UA Present Not Estab.   Bacteria, UA None seen None seen/Few        Assessment & Plan: Prostate cancer slight decline in the PSA over the last year from 9.9 to 8.7.  I will repeat in 6 months.     I am going to get him set up for a PSMA PET for further staging.   BPH with BOO.  He will continue the sildosin.    He has right scrotal mass that is more consistent with an inguinal hernia than a hydrocele.  I will get a CT to assess and if it is a hernia, I will have him see general surgery.       Meds ordered this encounter  Medications   silodosin (RAPAFLO) 8 MG CAPS capsule    Sig: Take 1 capsule (8 mg total) by mouth daily with breakfast.    Dispense:  30 capsule    Refill:  11     Orders Placed This Encounter  Procedures   Microscopic Examination   CT Abdomen Pelvis W Contrast    Standing Status:   Future    Standing Expiration Date:   12/28/2021    Order Specific Question:   If indicated for the ordered procedure, I authorize the administration of contrast media per Radiology protocol    Answer:   Yes    Order Specific Question:   Preferred imaging location?    Answer:   [facility]    Order Specific Question:   Is Oral Contrast requested for this exam?    Answer:   Yes, Per Radiology protocol   Urinalysis, Routine w reflex microscopic   PSA    Standing Status:   Future    Standing Expiration Date:   12/28/2021      Return in about 6 months (around 06/28/2021) for with PSA.   CC: Sharilyn Sites, MD      Irine Seal 12/28/2020 Patient ID: Randy Russo, male   DOB: 1940-07-19, 80 y.o.   MRN: [id number]

## 2020-12-29 ENCOUNTER — Other Ambulatory Visit: Payer: Self-pay

## 2021-01-08 ENCOUNTER — Other Ambulatory Visit: Payer: Self-pay

## 2021-01-08 ENCOUNTER — Telehealth: Payer: Self-pay | Admitting: Urology

## 2021-01-08 DIAGNOSIS — R9721 Rising PSA following treatment for malignant neoplasm of prostate: Secondary | ICD-10-CM

## 2021-01-08 NOTE — Telephone Encounter (Signed)
Per Aniceto Boss at Centralized scheduling, patient will need an order for stat labs (BUN & creatnine) for the day of his CT scan on 11/9.  Please place order

## 2021-01-08 NOTE — Telephone Encounter (Signed)
Daughter will bring patient to office 10/28 for lab work

## 2021-01-18 ENCOUNTER — Other Ambulatory Visit: Payer: Medicare HMO

## 2021-01-18 ENCOUNTER — Other Ambulatory Visit: Payer: Self-pay

## 2021-01-18 DIAGNOSIS — R9721 Rising PSA following treatment for malignant neoplasm of prostate: Secondary | ICD-10-CM

## 2021-01-18 DIAGNOSIS — C61 Malignant neoplasm of prostate: Secondary | ICD-10-CM | POA: Diagnosis not present

## 2021-01-19 ENCOUNTER — Other Ambulatory Visit: Payer: Medicare HMO

## 2021-01-19 LAB — BUN: BUN: 16 mg/dL (ref 8–27)

## 2021-01-19 LAB — CREATININE, SERUM
Creatinine, Ser: 0.93 mg/dL (ref 0.76–1.27)
eGFR: 83 mL/min/{1.73_m2} (ref >59)

## 2021-01-19 LAB — PSA: Prostate Specific Ag, Serum: 7.7 ng/mL — ABNORMAL HIGH (ref 0.0–4.0)

## 2021-01-22 NOTE — Progress Notes (Signed)
Results printed and mailed. PCP faxed results as well.

## 2021-01-31 ENCOUNTER — Other Ambulatory Visit: Payer: Self-pay

## 2021-01-31 ENCOUNTER — Ambulatory Visit (HOSPITAL_COMMUNITY)
Admission: RE | Admit: 2021-01-31 | Discharge: 2021-01-31 | Disposition: A | Discharge status: Home / Self Care | Payer: Medicare HMO | Source: Ambulatory Visit | Attending: Urology | Admitting: Urology

## 2021-01-31 DIAGNOSIS — C61 Malignant neoplasm of prostate: Secondary | ICD-10-CM | POA: Insufficient documentation

## 2021-01-31 DIAGNOSIS — K4090 Unilateral inguinal hernia, without obstruction or gangrene, not specified as recurrent: Secondary | ICD-10-CM | POA: Diagnosis not present

## 2021-01-31 DIAGNOSIS — K429 Umbilical hernia without obstruction or gangrene: Secondary | ICD-10-CM | POA: Diagnosis not present

## 2021-01-31 DIAGNOSIS — K5730 Diverticulosis of large intestine without perforation or abscess without bleeding: Secondary | ICD-10-CM | POA: Diagnosis not present

## 2021-01-31 MED ORDER — IOHEXOL 300 MG/ML  SOLN
100.0000 mL | Freq: Once | INTRAMUSCULAR | Status: AC | PRN
  Administered 2021-01-31: 100 mL via INTRAVENOUS

## 2021-02-02 ENCOUNTER — Telehealth: Payer: Self-pay

## 2021-02-02 DIAGNOSIS — K4090 Unilateral inguinal hernia, without obstruction or gangrene, not specified as recurrent: Secondary | ICD-10-CM

## 2021-02-02 NOTE — Telephone Encounter (Signed)
-----   Message from Irine Seal, MD sent at 02/01/2021  7:30 PM EST ----- No evidence of prostate cancer spread is noted but he has a large right inguinal hernia that is new since 2020 and a large amount of intestine is in the hernia down in the scrotum.  He also has an umbilical hernia.   HE needs a referral to general surgery.   If he has one, he should see that MD, but if not he could be seen by Dr. Constance Haw or Arnoldo Morale.  ----- Message ----- From: Iris Pert, LPN Sent: [id number]   2:53 PM EST To: Irine Seal, MD  Please review

## 2021-02-02 NOTE — Telephone Encounter (Signed)
Patient's daughter called and made aware.  Referral sent. Appointment made with Dr. Arnoldo Morale for 02/20/2021 @ 2:00 pm

## 2021-02-20 ENCOUNTER — Other Ambulatory Visit: Payer: Self-pay

## 2021-02-20 ENCOUNTER — Encounter: Payer: Self-pay | Admitting: General Surgery

## 2021-02-20 ENCOUNTER — Ambulatory Visit (INDEPENDENT_AMBULATORY_CARE_PROVIDER_SITE_OTHER): Payer: Medicare HMO | Admitting: General Surgery

## 2021-02-20 VITALS — BP 130/70 | HR 80 | Temp 98.2°F | Resp 16 | Ht 66.0 in | Wt 178.0 lb

## 2021-02-20 DIAGNOSIS — K4090 Unilateral inguinal hernia, without obstruction or gangrene, not specified as recurrent: Secondary | ICD-10-CM

## 2021-02-21 NOTE — H&P (Signed)
Randy Russo; [id number]; 01-14-1941   HPI Patient is an 80 year old black male who was referred to my care by Dr. Sharilyn Sites and Dr. Jeffie Pollock of urology for evaluation and treatment of a right inguinal hernia.  He states he has had the hernia for many months, but it has increased in size and is causing him discomfort.  It is not reducible given its size.  He denies any nausea or vomiting.  He does ambulate with a walker. Past Medical History:  Diagnosis Date   Arthritis    Cancer [facility])    Prostate   COPD (chronic obstructive pulmonary disease) ([place])    Hypertension     Past Surgical History:  Procedure Laterality Date   CATARACT EXTRACTION W/PHACO Left 11/16/2012   Procedure: CATARACT EXTRACTION PHACO AND INTRAOCULAR LENS PLACEMENT ([place]);  Surgeon: Tonny Branch, MD;  Location: [facility];  Service: Ophthalmology;  Laterality: Left;  CDE: 13.66   CATARACT EXTRACTION W/PHACO Right 01/18/2013   Procedure: CATARACT EXTRACTION PHACO AND INTRAOCULAR LENS PLACEMENT ([facility]);  Surgeon: Tonny Branch, MD;  Location: [facility];  Service: Ophthalmology;  Laterality: Right;  CDE:8.79   CERVICAL FUSION  4 yrs ago   COLONOSCOPY N/A 12/28/2017   Procedure: COLONOSCOPY;  Surgeon: Rogene Houston, MD;  Location: [facility];  Service: Endoscopy;  Laterality: N/A;   COLONOSCOPY N/A 01/01/2018   Procedure: COLONOSCOPY;  Surgeon: Rogene Houston, MD;  [facility];  Service: Endoscopy;  Laterality: N/A;   ESOPHAGOGASTRODUODENOSCOPY N/A 01/01/2018   Procedure: ESOPHAGOGASTRODUODENOSCOPY ([facility]);  Surgeon: Rogene Houston, MD;  [facility];  Service: Endoscopy;  Laterality: N/A;   JOINT REPLACEMENT Left 5 yrs ago   knee   PARTIAL COLECTOMY N/A 01/01/2018   Procedure: PARTIAL COLECTOMY;  Surgeon: Aviva Signs, MD;  Location: [facility];  Service: General;  Laterality: N/A;   right hip replacement Right 3 yrs ago    Family History  Problem Relation Age of Onset   Diabetes Mother    Dementia Father      Current Outpatient Medications on File Prior to Visit  Medication Sig Dispense Refill   albuterol (PROVENTIL) (2.5 MG/3ML) 0.083% nebulizer solution Take 2.5 mg by nebulization every 6 (six) hours as needed for wheezing or shortness of breath.     albuterol (VENTOLIN HFA) 108 (90 Base) MCG/ACT inhaler Inhale 1-2 puffs into the lungs every 6 (six) hours as needed for wheezing or shortness of breath.     cholecalciferol (VITAMIN D) 400 units TABS tablet Take 400 Units by mouth daily.      LUMIGAN 0.01 % SOLN      Omega-3 Fatty Acids (FISH OIL) 1200 MG CAPS Take 1,200 mg by mouth daily.     silodosin (RAPAFLO) 8 MG CAPS capsule Take 1 capsule (8 mg total) by mouth daily with breakfast. 30 capsule 11   SPIRIVA HANDIHALER 18 MCG inhalation capsule      No current facility-administered medications on file prior to visit.    No Known Allergies  Social History   Substance and Sexual Activity  Alcohol Use No    Social History   Tobacco Use  Smoking Status Light Smoker   Types: Cigars  Smokeless Tobacco Never    Review of Systems  Constitutional: Negative.   HENT: Negative.    Eyes: Negative.   Respiratory: Negative.    Cardiovascular: Negative.   Gastrointestinal: Negative.   Genitourinary: Negative.   Musculoskeletal: Negative.   Skin: Negative.   Neurological: Negative.  Endo/Heme/Allergies: Negative.   Psychiatric/Behavioral: Negative.     Objective   Vitals:   02/20/21 1356  BP: 130/70  Pulse: 80  Resp: 16  Temp: 98.2 F (36.8 C)  SpO2: 97%    Physical Exam Vitals reviewed.  Constitutional:      Appearance: Normal appearance. He is normal weight. He is not ill-appearing.  HENT:     Head: Normocephalic and atraumatic.  Cardiovascular:     Rate and Rhythm: Normal rate and regular rhythm.     Heart sounds: Normal heart sounds. No murmur heard.   No friction rub. No gallop.  Pulmonary:     Effort: Pulmonary effort is normal. No respiratory distress.      Breath sounds: Normal breath sounds. No stridor. No wheezing, rhonchi or rales.  Abdominal:     General: Bowel sounds are normal. There is no distension.     Palpations: Abdomen is soft. There is no mass.     Tenderness: There is no abdominal tenderness. There is no guarding or rebound.     Hernia: A hernia is present.     Comments: Large right inguinal hernia with extension of bowel into the scrotum.  Skin:    General: Skin is warm and dry.  Neurological:     Mental Status: He is alert and oriented to person, place, and time.    Assessment  Right inguinal hernia Plan  Patient is scheduled for right inguinal herniorrhaphy with mesh on 03/12/2021.  The risks and benefits of the procedure including bleeding, infection, mesh use, and the possibility of recurrence of the hernia given its size were fully explained to the patient, who gave informed consent.

## 2021-02-21 NOTE — Progress Notes (Signed)
Randy Russo; [id number]; 03-07-1941   HPI Patient is an 80 year old black male who was referred to my care by Dr. Sharilyn Sites and Dr. Jeffie Pollock of urology for evaluation and treatment of a right inguinal hernia.  He states he has had the hernia for many months, but it has increased in size and is causing him discomfort.  It is not reducible given its size.  He denies any nausea or vomiting.  He does ambulate with a walker. Past Medical History:  Diagnosis Date   Arthritis    Cancer [facility])    Prostate   COPD (chronic obstructive pulmonary disease) ([place])    Hypertension     Past Surgical History:  Procedure Laterality Date   CATARACT EXTRACTION W/PHACO Left 11/16/2012   Procedure: CATARACT EXTRACTION PHACO AND INTRAOCULAR LENS PLACEMENT ([place]);  Surgeon: Tonny Branch, MD;  Location: [facility];  Service: Ophthalmology;  Laterality: Left;  CDE: 13.66   CATARACT EXTRACTION W/PHACO Right 01/18/2013   Procedure: CATARACT EXTRACTION PHACO AND INTRAOCULAR LENS PLACEMENT ([facility]);  Surgeon: Tonny Branch, MD;  Location: [facility];  Service: Ophthalmology;  Laterality: Right;  CDE:8.79   CERVICAL FUSION  4 yrs ago   COLONOSCOPY N/A 12/28/2017   Procedure: COLONOSCOPY;  Surgeon: Rogene Houston, MD;  [facility];  Service: Endoscopy;  Laterality: N/A;   COLONOSCOPY N/A 01/01/2018   Procedure: COLONOSCOPY;  Surgeon: Rogene Houston, MD;  [facility];  Service: Endoscopy;  Laterality: N/A;   ESOPHAGOGASTRODUODENOSCOPY N/A 01/01/2018   Procedure: ESOPHAGOGASTRODUODENOSCOPY ([facility]);  Surgeon: Rogene Houston, MD;  [facility];  Service: Endoscopy;  Laterality: N/A;   JOINT REPLACEMENT Left 5 yrs ago   knee   PARTIAL COLECTOMY N/A 01/01/2018   Procedure: PARTIAL COLECTOMY;  Surgeon: Aviva Signs, MD;  Location: [facility];  Service: General;  Laterality: N/A;   right hip replacement Right 3 yrs ago    Family History  Problem Relation Age of Onset   Diabetes Mother    Dementia Father      Current Outpatient Medications on File Prior to Visit  Medication Sig Dispense Refill   albuterol (PROVENTIL) (2.5 MG/3ML) 0.083% nebulizer solution Take 2.5 mg by nebulization every 6 (six) hours as needed for wheezing or shortness of breath.     albuterol (VENTOLIN HFA) 108 (90 Base) MCG/ACT inhaler Inhale 1-2 puffs into the lungs every 6 (six) hours as needed for wheezing or shortness of breath.     cholecalciferol (VITAMIN D) 400 units TABS tablet Take 400 Units by mouth daily.      LUMIGAN 0.01 % SOLN      Omega-3 Fatty Acids (FISH OIL) 1200 MG CAPS Take 1,200 mg by mouth daily.     silodosin (RAPAFLO) 8 MG CAPS capsule Take 1 capsule (8 mg total) by mouth daily with breakfast. 30 capsule 11   SPIRIVA HANDIHALER 18 MCG inhalation capsule      No current facility-administered medications on file prior to visit.    No Known Allergies  Social History   Substance and Sexual Activity  Alcohol Use No    Social History   Tobacco Use  Smoking Status Light Smoker   Types: Cigars  Smokeless Tobacco Never    Review of Systems  Constitutional: Negative.   HENT: Negative.    Eyes: Negative.   Respiratory: Negative.    Cardiovascular: Negative.   Gastrointestinal: Negative.   Genitourinary: Negative.   Musculoskeletal: Negative.   Skin: Negative.   Neurological: Negative.  Endo/Heme/Allergies: Negative.   Psychiatric/Behavioral: Negative.     Objective   Vitals:   02/20/21 1356  BP: 130/70  Pulse: 80  Resp: 16  Temp: 98.2 F (36.8 C)  SpO2: 97%    Physical Exam Vitals reviewed.  Constitutional:      Appearance: Normal appearance. He is normal weight. He is not ill-appearing.  HENT:     Head: Normocephalic and atraumatic.  Cardiovascular:     Rate and Rhythm: Normal rate and regular rhythm.     Heart sounds: Normal heart sounds. No murmur heard.   No friction rub. No gallop.  Pulmonary:     Effort: Pulmonary effort is normal. No respiratory distress.      Breath sounds: Normal breath sounds. No stridor. No wheezing, rhonchi or rales.  Abdominal:     General: Bowel sounds are normal. There is no distension.     Palpations: Abdomen is soft. There is no mass.     Tenderness: There is no abdominal tenderness. There is no guarding or rebound.     Hernia: A hernia is present.     Comments: Large right inguinal hernia with extension of bowel into the scrotum.  Skin:    General: Skin is warm and dry.  Neurological:     Mental Status: He is alert and oriented to person, place, and time.    Assessment  Right inguinal hernia Plan  Patient is scheduled for right inguinal herniorrhaphy with mesh on 03/12/2021.  The risks and benefits of the procedure including bleeding, infection, mesh use, and the possibility of recurrence of the hernia given its size were fully explained to the patient, who gave informed consent.

## 2021-03-01 ENCOUNTER — Ambulatory Visit: Payer: Medicare HMO | Admitting: General Surgery

## 2021-03-06 NOTE — Patient Instructions (Signed)
Randy Russo  03/06/2021     @PREFPERIOPPHARMACY @   Your procedure is scheduled on  03/12/2021.   Report to [facility] at  0800  A.M.   Call this number if you have problems the morning of surgery:  [phone]   Remember:  Do not eat or drink after midnight.     Use your nebulizer and your inhalers before you come and bring your rescue inhaler with you.      Take these medicines the morning of surgery with A SIP OF WATER                                  [place].     Do not wear jewelry, make-up or nail polish.  Do not wear lotions, powders, or perfumes, or deodorant.  Do not shave 48 hours prior to surgery.  Men may shave face and neck.  Do not bring valuables to the hospital.  [facility] is not responsible for any belongings or valuables.  Contacts, dentures or bridgework may not be worn into surgery.  Leave your suitcase in the car.  After surgery it may be brought to your room.  For patients admitted to the hospital, discharge time will be determined by your treatment team.  Patients discharged the day of surgery will not be allowed to drive home and must have someone with them for 24 hours.    Special instructions:        DO NOT smoke tobacco or vape for 24 hours before your procedure.   Please read over the following fact sheets that you were given. Coughing and Deep Breathing, Surgical Site Infection Prevention, Anesthesia Post-op Instructions, and Care and Recovery After Surgery      Open Hernia Repair, Adult, Care After What can I expect after the procedure? After the procedure, it is common to have: Mild discomfort. Slight bruising. Mild swelling. Pain in the belly (abdomen). A small amount of blood from the cut from surgery (incision). Follow these instructions at home: Your doctor may give you more specific instructions. If you have problems, call your doctor. Medicines Take over-the-counter and prescription medicines only as  told by your doctor. If told, take steps to prevent problems with pooping (constipation). You may need to: Drink enough fluid to keep your pee (urine) pale yellow. Take medicines. You will be told what medicines to take. Eat foods that are high in fiber. These include beans, whole grains, and fresh fruits and vegetables. Limit foods that are high in fat and sugar. These include fried or sweet foods. Ask your doctor if you should avoid driving or using machines while you are taking your medicine. Incision care  Follow instructions from your doctor about how to take care of your incision. Make sure you: Wash your hands with soap and water for at least 20 seconds before and after you change your bandage (dressing). If you cannot use soap and water, use hand sanitizer. Change your bandage. Leave stitches or skin glue in place for at least 2 weeks. Leave tape strips alone unless you are told to take them off. You may trim the edges of the tape strips if they curl up. Check your incision every day for signs of infection. Check for: More redness, swelling, or pain. More fluid or blood. Warmth. Pus or a bad smell. Wear loose, soft clothing while your incision heals.  Activity  Rest as told by your doctor. Do not lift anything that is heavier than 10 lb (4.5 kg), or the limit that you are told. Do not play contact sports until your doctor says that this is safe. If you were given a sedative during your procedure, do not drive or use machines until your doctor says that it is safe. A sedative is a medicine that helps you relax. Return to your normal activities when your doctor says that it is safe. General instructions Do not take baths, swim, or use a hot tub. Ask your doctor about taking showers or sponge baths. Hold a pillow over your belly when you cough or sneeze. This helps with pain. Do not smoke or use any products that contain nicotine or tobacco. If you need help quitting, ask your  doctor. Keep all follow-up visits. Contact a doctor if: You have any of these signs of infection in or around your incision: More redness, swelling, or pain. More fluid or blood. Warmth. Pus. A bad smell. You have a fever or chills. You have blood in your poop (stool). You have not pooped (had a bowel movement) in 2-3 days. Medicine does not help your pain. Get help right away if: You have chest pain, or you are short of breath. You feel faint or light-headed. You have very bad pain. You vomit and your pain is worse. You have pain, swelling, or redness in a leg. These symptoms may be an emergency. Get help right away. Call your local emergency services (911 in the [place] Do not wait to see if the symptoms will go away. Do not drive yourself to the hospital. Summary After this procedure, it is common to have mild discomfort, slight bruising, and mild swelling. Follow instructions from your doctor about how to take care of your cut from surgery (incision). Check every day for signs of infection. Do not lift heavy objects or play contact sports until your doctor says it is safe. Return to your normal activities as told by your doctor. This information is not intended to replace advice given to you by your health care provider. Make sure you discuss any questions you have with your health care provider. Document Revised: 10/25/2019 Document Reviewed: 10/25/2019 Elsevier Patient Education  2022 [place] Anesthesia, Adult, Care After This sheet gives you information about how to care for yourself after your procedure. Your health care provider may also give you more specific instructions. If you have problems or questions, contact your health care provider. What can I expect after the procedure? After the procedure, the following side effects are common: Pain or discomfort at the IV site. Nausea. Vomiting. Sore throat. Trouble concentrating. Feeling cold or  chills. Feeling weak or tired. Sleepiness and fatigue. Soreness and body aches. These side effects can affect parts of the body that were not involved in surgery. Follow these instructions at home: For the time period you were told by your health care provider:  Rest. Do not participate in activities where you could fall or become injured. Do not drive or use machinery. Do not drink alcohol. Do not take sleeping pills or medicines that cause drowsiness. Do not make important decisions or sign legal documents. Do not take care of children on your own. Eating and drinking Follow any instructions from your health care provider about eating or drinking restrictions. When you feel hungry, start by eating small amounts of foods that are soft and easy to digest (bland), such as toast.  Gradually return to your regular diet. Drink enough fluid to keep your urine pale yellow. If you vomit, rehydrate by drinking water, juice, or clear broth. General instructions If you have sleep apnea, surgery and certain medicines can increase your risk for breathing problems. Follow instructions from your health care provider about wearing your sleep device: Anytime you are sleeping, including during daytime naps. While taking prescription pain medicines, sleeping medicines, or medicines that make you drowsy. Have a responsible adult stay with you for the time you are told. It is important to have someone help care for you until you are awake and alert. Return to your normal activities as told by your health care provider. Ask your health care provider what activities are safe for you. Take over-the-counter and prescription medicines only as told by your health care provider. If you smoke, do not smoke without supervision. Keep all follow-up visits as told by your health care provider. This is important. Contact a health care provider if: You have nausea or vomiting that does not get better with medicine. You  cannot eat or drink without vomiting. You have pain that does not get better with medicine. You are unable to pass urine. You develop a skin rash. You have a fever. You have redness around your IV site that gets worse. Get help right away if: You have difficulty breathing. You have chest pain. You have blood in your urine or stool, or you vomit blood. Summary After the procedure, it is common to have a sore throat or nausea. It is also common to feel tired. Have a responsible adult stay with you for the time you are told. It is important to have someone help care for you until you are awake and alert. When you feel hungry, start by eating small amounts of foods that are soft and easy to digest (bland), such as toast. Gradually return to your regular diet. Drink enough fluid to keep your urine pale yellow. Return to your normal activities as told by your health care provider. Ask your health care provider what activities are safe for you. This information is not intended to replace advice given to you by your health care provider. Make sure you discuss any questions you have with your health care provider. Document Revised: 11/25/2019 Document Reviewed: 06/24/2019 Elsevier Patient Education  2022 [employer]. How to Use Chlorhexidine for Bathing Chlorhexidine gluconate (CHG) is a germ-killing (antiseptic) solution that is used to clean the skin. It can get rid of the bacteria that normally live on the skin and can keep them away for about 24 hours. To clean your skin with CHG, you may be given: A CHG solution to use in the shower or as part of a sponge bath. A prepackaged cloth that contains CHG. Cleaning your skin with CHG may help lower the risk for infection: While you are staying in the intensive care unit of the hospital. If you have a vascular access, such as a central line, to provide short-term or long-term access to your veins. If you have a catheter to drain urine from your  bladder. If you are on a ventilator. A ventilator is a machine that helps you breathe by moving air in and out of your lungs. After surgery. What are the risks? Risks of using CHG include: A skin reaction. Hearing loss, if CHG gets in your ears and you have a perforated eardrum. Eye injury, if CHG gets in your eyes and is not rinsed out. The CHG product  catching fire. Make sure that you avoid smoking and flames after applying CHG to your skin. Do not use CHG: If you have a chlorhexidine allergy or have previously reacted to chlorhexidine. On babies younger than 40 months of age. How to use CHG solution Use CHG only as told by your health care provider, and follow the instructions on the label. Use the full amount of CHG as directed. Usually, this is one bottle. During a shower Follow these steps when using CHG solution during a shower (unless your health care provider gives you different instructions): Start the shower. Use your normal soap and shampoo to wash your face and hair. Turn off the shower or move out of the shower stream. Pour the CHG onto a clean washcloth. Do not use any type of brush or rough-edged sponge. Starting at your neck, lather your body down to your toes. Make sure you follow these instructions: If you will be having surgery, pay special attention to the part of your body where you will be having surgery. Scrub this area for at least 1 minute. Do not use CHG on your head or face. If the solution gets into your ears or eyes, rinse them well with water. Avoid your genital area. Avoid any areas of skin that have broken skin, cuts, or scrapes. Scrub your back and under your arms. Make sure to wash skin folds. Let the lather sit on your skin for 1-2 minutes or as long as told by your health care provider. Thoroughly rinse your entire body in the shower. Make sure that all body creases and crevices are rinsed well. Dry off with a clean towel. Do not put any substances on  your body afterward--such as powder, lotion, or perfume--unless you are told to do so by your health care provider. Only use lotions that are recommended by the manufacturer. Put on clean clothes or pajamas. If it is the night before your surgery, sleep in clean sheets.  During a sponge bath Follow these steps when using CHG solution during a sponge bath (unless your health care provider gives you different instructions): Use your normal soap and shampoo to wash your face and hair. Pour the CHG onto a clean washcloth. Starting at your neck, lather your body down to your toes. Make sure you follow these instructions: If you will be having surgery, pay special attention to the part of your body where you will be having surgery. Scrub this area for at least 1 minute. Do not use CHG on your head or face. If the solution gets into your ears or eyes, rinse them well with water. Avoid your genital area. Avoid any areas of skin that have broken skin, cuts, or scrapes. Scrub your back and under your arms. Make sure to wash skin folds. Let the lather sit on your skin for 1-2 minutes or as long as told by your health care provider. Using a different clean, wet washcloth, thoroughly rinse your entire body. Make sure that all body creases and crevices are rinsed well. Dry off with a clean towel. Do not put any substances on your body afterward--such as powder, lotion, or perfume--unless you are told to do so by your health care provider. Only use lotions that are recommended by the manufacturer. Put on clean clothes or pajamas. If it is the night before your surgery, sleep in clean sheets. How to use CHG prepackaged cloths Only use CHG cloths as told by your health care provider, and follow the instructions on  the label. Use the CHG cloth on clean, dry skin. Do not use the CHG cloth on your head or face unless your health care provider tells you to. When washing with the CHG cloth: Avoid your genital  area. Avoid any areas of skin that have broken skin, cuts, or scrapes. Before surgery Follow these steps when using a CHG cloth to clean before surgery (unless your health care provider gives you different instructions): Using the CHG cloth, vigorously scrub the part of your body where you will be having surgery. Scrub using a back-and-forth motion for 3 minutes. The area on your body should be completely wet with CHG when you are done scrubbing. Do not rinse. Discard the cloth and let the area air-dry. Do not put any substances on the area afterward, such as powder, lotion, or perfume. Put on clean clothes or pajamas. If it is the night before your surgery, sleep in clean sheets.  For general bathing Follow these steps when using CHG cloths for general bathing (unless your health care provider gives you different instructions). Use a separate CHG cloth for each area of your body. Make sure you wash between any folds of skin and between your fingers and toes. Wash your body in the following order, switching to a new cloth after each step: The front of your neck, shoulders, and chest. Both of your arms, under your arms, and your hands. Your stomach and groin area, avoiding the genitals. Your right leg and foot. Your left leg and foot. The back of your neck, your back, and your buttocks. Do not rinse. Discard the cloth and let the area air-dry. Do not put any substances on your body afterward--such as powder, lotion, or perfume--unless you are told to do so by your health care provider. Only use lotions that are recommended by the manufacturer. Put on clean clothes or pajamas. Contact a health care provider if: Your skin gets irritated after scrubbing. You have questions about using your solution or cloth. You swallow any chlorhexidine. Call your local poison control center (1-(778) 331-5692 in the [place] Get help right away if: Your eyes itch badly, or they become very red or swollen. Your  skin itches badly and is red or swollen. Your hearing changes. You have trouble seeing. You have swelling or tingling in your mouth or throat. You have trouble breathing. These symptoms may represent a serious problem that is an emergency. Do not wait to see if the symptoms will go away. Get medical help right away. Call your local emergency services (911 in the [place] Do not drive yourself to the hospital. Summary Chlorhexidine gluconate (CHG) is a germ-killing (antiseptic) solution that is used to clean the skin. Cleaning your skin with CHG may help to lower your risk for infection. You may be given CHG to use for bathing. It may be in a bottle or in a prepackaged cloth to use on your skin. Carefully follow your health care provider's instructions and the instructions on the product label. Do not use CHG if you have a chlorhexidine allergy. Contact your health care provider if your skin gets irritated after scrubbing. This information is not intended to replace advice given to you by your health care provider. Make sure you discuss any questions you have with your health care provider. Document Revised: 05/22/2020 Document Reviewed: 05/22/2020 Elsevier Patient Education  2022 [employer].

## 2021-03-08 ENCOUNTER — Encounter (HOSPITAL_COMMUNITY): Payer: Self-pay

## 2021-03-08 ENCOUNTER — Other Ambulatory Visit: Payer: Self-pay

## 2021-03-08 ENCOUNTER — Encounter (HOSPITAL_COMMUNITY)
Admission: RE | Admit: 2021-03-08 | Discharge: 2021-03-08 | Disposition: A | Discharge status: Home / Self Care | Payer: Medicare HMO | Source: Ambulatory Visit | Attending: General Surgery | Admitting: General Surgery

## 2021-03-08 DIAGNOSIS — Z01810 Encounter for preprocedural cardiovascular examination: Secondary | ICD-10-CM | POA: Insufficient documentation

## 2021-03-12 ENCOUNTER — Ambulatory Visit (HOSPITAL_COMMUNITY): Payer: Medicare HMO | Admitting: Anesthesiology

## 2021-03-12 ENCOUNTER — Encounter (HOSPITAL_COMMUNITY)
Admission: RE | Disposition: A | Discharge status: Home / Self Care | Payer: Self-pay | Source: Ambulatory Visit | Attending: General Surgery

## 2021-03-12 ENCOUNTER — Ambulatory Visit (HOSPITAL_COMMUNITY)
Admission: RE | Admit: 2021-03-12 | Discharge: 2021-03-12 | Disposition: A | Discharge status: Home / Self Care | Payer: Medicare HMO | Source: Ambulatory Visit | Attending: General Surgery | Admitting: General Surgery

## 2021-03-12 ENCOUNTER — Other Ambulatory Visit: Payer: Self-pay

## 2021-03-12 ENCOUNTER — Encounter (HOSPITAL_COMMUNITY): Payer: Self-pay | Admitting: General Surgery

## 2021-03-12 DIAGNOSIS — K4090 Unilateral inguinal hernia, without obstruction or gangrene, not specified as recurrent: Secondary | ICD-10-CM | POA: Diagnosis not present

## 2021-03-12 DIAGNOSIS — F17290 Nicotine dependence, other tobacco product, uncomplicated: Secondary | ICD-10-CM | POA: Insufficient documentation

## 2021-03-12 DIAGNOSIS — J449 Chronic obstructive pulmonary disease, unspecified: Secondary | ICD-10-CM | POA: Diagnosis not present

## 2021-03-12 DIAGNOSIS — K219 Gastro-esophageal reflux disease without esophagitis: Secondary | ICD-10-CM | POA: Diagnosis not present

## 2021-03-12 DIAGNOSIS — M1990 Unspecified osteoarthritis, unspecified site: Secondary | ICD-10-CM | POA: Insufficient documentation

## 2021-03-12 DIAGNOSIS — D649 Anemia, unspecified: Secondary | ICD-10-CM | POA: Insufficient documentation

## 2021-03-12 DIAGNOSIS — D759 Disease of blood and blood-forming organs, unspecified: Secondary | ICD-10-CM | POA: Insufficient documentation

## 2021-03-12 DIAGNOSIS — I10 Essential (primary) hypertension: Secondary | ICD-10-CM | POA: Insufficient documentation

## 2021-03-12 HISTORY — PX: INGUINAL HERNIA REPAIR: SHX194

## 2021-03-12 SURGERY — REPAIR, HERNIA, INGUINAL, ADULT
Anesthesia: General | Site: Abdomen | Laterality: Right

## 2021-03-12 MED ORDER — CHLORHEXIDINE GLUCONATE CLOTH 2 % EX PADS: 6.0000 | MEDICATED_PAD | Freq: Once | CUTANEOUS | Status: DC

## 2021-03-12 MED ORDER — KETOROLAC TROMETHAMINE 30 MG/ML IJ SOLN
15.0000 mg | Freq: Once | INTRAMUSCULAR | Status: AC
  Administered 2021-03-12: 12:00:00 15 mg via INTRAVENOUS
  Filled 2021-03-12: qty 1

## 2021-03-12 MED ORDER — SUCCINYLCHOLINE CHLORIDE 200 MG/10ML IV SOSY
PREFILLED_SYRINGE | INTRAVENOUS | Status: AC
  Filled 2021-03-12: qty 10

## 2021-03-12 MED ORDER — LIDOCAINE 2% (20 MG/ML) 5 ML SYRINGE
INTRAMUSCULAR | Status: DC | PRN
  Administered 2021-03-12: 40 mg via INTRAVENOUS

## 2021-03-12 MED ORDER — ROCURONIUM BROMIDE 10 MG/ML (PF) SYRINGE
PREFILLED_SYRINGE | INTRAVENOUS | Status: AC
  Filled 2021-03-12: qty 10

## 2021-03-12 MED ORDER — SUGAMMADEX SODIUM 200 MG/2ML IV SOLN
INTRAVENOUS | Status: DC | PRN
  Administered 2021-03-12: 160 mg via INTRAVENOUS

## 2021-03-12 MED ORDER — ONDANSETRON HCL 4 MG/2ML IJ SOLN
INTRAMUSCULAR | Status: AC
  Filled 2021-03-12: qty 2

## 2021-03-12 MED ORDER — PHENYLEPHRINE 40 MCG/ML (10ML) SYRINGE FOR IV PUSH (FOR BLOOD PRESSURE SUPPORT)
PREFILLED_SYRINGE | INTRAVENOUS | Status: AC
  Filled 2021-03-12: qty 10

## 2021-03-12 MED ORDER — CHLORHEXIDINE GLUCONATE 0.12 % MT SOLN
15.0000 mL | Freq: Once | OROMUCOSAL | Status: AC
  Administered 2021-03-12: 09:00:00 15 mL via OROMUCOSAL
  Filled 2021-03-12: qty 15

## 2021-03-12 MED ORDER — DEXAMETHASONE SODIUM PHOSPHATE 10 MG/ML IJ SOLN
INTRAMUSCULAR | Status: DC | PRN
  Administered 2021-03-12: 10 mg via INTRAVENOUS

## 2021-03-12 MED ORDER — ONDANSETRON HCL 4 MG/2ML IJ SOLN
INTRAMUSCULAR | Status: DC | PRN
  Administered 2021-03-12: 4 mg via INTRAVENOUS

## 2021-03-12 MED ORDER — ROCURONIUM BROMIDE 10 MG/ML (PF) SYRINGE
PREFILLED_SYRINGE | INTRAVENOUS | Status: DC | PRN
  Administered 2021-03-12: 50 mg via INTRAVENOUS

## 2021-03-12 MED ORDER — PHENYLEPHRINE HCL (PRESSORS) 10 MG/ML IV SOLN
INTRAVENOUS | Status: DC | PRN
  Administered 2021-03-12: 80 ug via INTRAVENOUS
  Administered 2021-03-12: 40 ug via INTRAVENOUS

## 2021-03-12 MED ORDER — HYDROMORPHONE HCL 1 MG/ML IJ SOLN: 0.2500 mg | INTRAMUSCULAR | Status: DC | PRN

## 2021-03-12 MED ORDER — DEXAMETHASONE SODIUM PHOSPHATE 10 MG/ML IJ SOLN
INTRAMUSCULAR | Status: AC
  Filled 2021-03-12: qty 1

## 2021-03-12 MED ORDER — FENTANYL CITRATE (PF) 250 MCG/5ML IJ SOLN
INTRAMUSCULAR | Status: DC | PRN
  Administered 2021-03-12 (×2): 25 ug via INTRAVENOUS
  Administered 2021-03-12: 100 ug via INTRAVENOUS

## 2021-03-12 MED ORDER — LIDOCAINE HCL (PF) 2 % IJ SOLN
INTRAMUSCULAR | Status: AC
  Filled 2021-03-12: qty 5

## 2021-03-12 MED ORDER — BUPIVACAINE HCL (300 MG DOSE) 3 X 100 MG IL IMPL
DRUG_IMPLANT | Status: AC
  Filled 2021-03-12: qty 100

## 2021-03-12 MED ORDER — BUPIVACAINE HCL (300 MG DOSE) 3 X 100 MG IL IMPL
DRUG_IMPLANT | Status: DC | PRN
  Administered 2021-03-12: 300 mg

## 2021-03-12 MED ORDER — ONDANSETRON HCL 4 MG/2ML IJ SOLN: 4.0000 mg | Freq: Once | INTRAMUSCULAR | Status: DC | PRN

## 2021-03-12 MED ORDER — LACTATED RINGERS IV SOLN: INTRAVENOUS | Status: DC

## 2021-03-12 MED ORDER — PROPOFOL 10 MG/ML IV BOLUS
INTRAVENOUS | Status: DC | PRN
  Administered 2021-03-12: 100 mg via INTRAVENOUS
  Administered 2021-03-12: 20 mg via INTRAVENOUS

## 2021-03-12 MED ORDER — ESMOLOL HCL 100 MG/10ML IV SOLN
INTRAVENOUS | Status: AC
  Filled 2021-03-12: qty 10

## 2021-03-12 MED ORDER — TRAMADOL HCL 50 MG PO TABS: 50.0000 mg | ORAL_TABLET | Freq: Four times a day (QID) | ORAL | 0 refills | Status: DC | PRN

## 2021-03-12 MED ORDER — SODIUM CHLORIDE 0.9 % IR SOLN
Status: DC | PRN
  Administered 2021-03-12: 1000 mL

## 2021-03-12 MED ORDER — FENTANYL CITRATE (PF) 250 MCG/5ML IJ SOLN
INTRAMUSCULAR | Status: AC
  Filled 2021-03-12: qty 5

## 2021-03-12 MED ORDER — CEFAZOLIN SODIUM-DEXTROSE 2-4 GM/100ML-% IV SOLN
2.0000 g | INTRAVENOUS | Status: AC
  Administered 2021-03-12: 10:00:00 2 g via INTRAVENOUS
  Filled 2021-03-12: qty 100

## 2021-03-12 MED ORDER — ORAL CARE MOUTH RINSE: 15.0000 mL | Freq: Once | OROMUCOSAL | Status: AC

## 2021-03-12 MED ORDER — PROPOFOL 10 MG/ML IV BOLUS
INTRAVENOUS | Status: AC
  Filled 2021-03-12: qty 20

## 2021-03-12 SURGICAL SUPPLY — 32 items
ADH SKN CLS APL DERMABOND .7 (GAUZE/BANDAGES/DRESSINGS) ×1
CLOTH BEACON ORANGE TIMEOUT ST (SAFETY) ×3 IMPLANT
COVER LIGHT HANDLE STERIS (MISCELLANEOUS) ×6 IMPLANT
DERMABOND ADVANCED (GAUZE/BANDAGES/DRESSINGS) ×2
DERMABOND ADVANCED .7 DNX12 (GAUZE/BANDAGES/DRESSINGS) ×1 IMPLANT
DRAIN PENROSE 0.5X18 (DRAIN) ×3 IMPLANT
ELECT REM PT RETURN 9FT ADLT (ELECTROSURGICAL) ×3
ELECTRODE REM PT RTRN 9FT ADLT (ELECTROSURGICAL) ×1 IMPLANT
GAUZE 4X4 16PLY ~~LOC~~+RFID DBL (SPONGE) ×5 IMPLANT
GAUZE SPONGE 4X4 12PLY STRL (GAUZE/BANDAGES/DRESSINGS) ×3 IMPLANT
GLOVE SURG POLYISO LF SZ6.5 (GLOVE) ×2 IMPLANT
GLOVE SURG POLYISO LF SZ7.5 (GLOVE) ×3 IMPLANT
GLOVE SURG UNDER POLY LF SZ6.5 (GLOVE) ×2 IMPLANT
GLOVE SURG UNDER POLY LF SZ7 (GLOVE) ×9 IMPLANT
GOWN STRL REUS W/TWL LRG LVL3 (GOWN DISPOSABLE) ×9 IMPLANT
INST SET MINOR GENERAL (KITS) ×3 IMPLANT
KIT TURNOVER KIT A (KITS) ×3 IMPLANT
MANIFOLD NEPTUNE II (INSTRUMENTS) ×3 IMPLANT
MESH HERNIA 1.6X1.9 PLUG LRG (Mesh General) IMPLANT
MESH HERNIA PLUG LRG (Mesh General) ×2 IMPLANT
NS IRRIG 1000ML POUR BTL (IV SOLUTION) ×3 IMPLANT
PACK MINOR (CUSTOM PROCEDURE TRAY) ×3 IMPLANT
PAD ARMBOARD 7.5X6 YLW CONV (MISCELLANEOUS) ×3 IMPLANT
SET BASIN LINEN APH (SET/KITS/TRAYS/PACK) ×3 IMPLANT
SOL PREP PROV IODINE SCRUB 4OZ (MISCELLANEOUS) ×3 IMPLANT
SUT MNCRL AB 4-0 PS2 18 (SUTURE) ×3 IMPLANT
SUT NOVA NAB GS-22 2 2-0 T-19 (SUTURE) ×8 IMPLANT
SUT VIC AB 2-0 CT1 27 (SUTURE) ×3
SUT VIC AB 2-0 CT1 TAPERPNT 27 (SUTURE) ×1 IMPLANT
SUT VIC AB 3-0 SH 27 (SUTURE) ×3
SUT VIC AB 3-0 SH 27X BRD (SUTURE) ×1 IMPLANT
SUT VICRYL AB 3 0 TIES (SUTURE) ×2 IMPLANT

## 2021-03-12 NOTE — Anesthesia Postprocedure Evaluation (Signed)
Anesthesia Post Note  Patient: Randy Russo  Procedure(s) Performed: HERNIA REPAIR INGUINAL ADULT (Right: Abdomen)  Patient location during evaluation: PACU Anesthesia Type: General Level of consciousness: awake and alert and oriented Pain management: pain level controlled Vital Signs Assessment: post-procedure vital signs reviewed and stable Respiratory status: spontaneous breathing, nonlabored ventilation and respiratory function stable Cardiovascular status: blood pressure returned to baseline and stable Postop Assessment: no apparent nausea or vomiting Anesthetic complications: no   No notable events documented.   Last Vitals:  Vitals:   03/12/21 1150 03/12/21 1155  BP: 131/66 (!) 141/84  Pulse: 61 68  Resp: 15 18  Temp:  36.5 C  SpO2: 100% 99%    Last Pain:  Vitals:   03/12/21 1155  TempSrc: Oral  PainSc: 0-No pain                 Shenee Wignall C Shemuel Harkleroad

## 2021-03-12 NOTE — Interval H&P Note (Signed)
History and Physical Interval Note:  03/12/2021 9:26 AM  Randy Russo  has presented today for surgery, with the diagnosis of Right inguinal hernia.  The various methods of treatment have been discussed with the patient and family. After consideration of risks, benefits and other options for treatment, the patient has consented to  Procedure(s): HERNIA REPAIR INGUINAL ADULT (Right) as a surgical intervention.  The patient's history has been reviewed, patient examined, no change in status, stable for surgery.  I have reviewed the patient's chart and labs.  Questions were answered to the patient's satisfaction.     Aviva Signs

## 2021-03-12 NOTE — Transfer of Care (Signed)
Immediate Anesthesia Transfer of Care Note  Patient: Randy Russo  Procedure(s) Performed: HERNIA REPAIR INGUINAL ADULT (Right: Abdomen)  Patient Location: PACU  Anesthesia Type:General  Level of Consciousness: awake, alert  and oriented  Airway & Oxygen Therapy: Patient Spontanous Breathing and Patient connected to nasal cannula oxygen  Post-op Assessment: Report given to RN, Post -op Vital signs reviewed and stable, Patient moving all extremities X 4 and Patient able to stick tongue midline  Post vital signs: stable  Last Vitals:  Vitals Value Taken Time  BP 130/83   Temp 97.6   Pulse 73 03/12/21 1125  Resp 17 03/12/21 1125  SpO2 97 % 03/12/21 1125  Vitals shown include unvalidated device data.  Last Pain:  Vitals:   03/12/21 0830  TempSrc: Oral  PainSc: 0-No pain         Complications: No notable events documented.

## 2021-03-12 NOTE — Op Note (Signed)
Patient:  Randy Russo  DOB:  17-Apr-1940  MRN:  [id number]   Preop Diagnosis: Right inguinal hernia  Postop Diagnosis: Same  Procedure: Right inguinal herniorrhaphy with mesh  Surgeon: Aviva Signs, MD  Assistant:Katie Pappaliou, MD  Anes: General endotracheal  Indications: Patient is an 80 year old black male who presents with a symptomatic right inguinal hernia.  The risks and benefits of the procedure including bleeding, infection, mesh use, the possibility of recurrence of the hernia were fully explained to the patient, who gave informed consent.  Procedure note: The patient was placed in the supine position.  After general anesthesia was administered, the right groin region was prepped and draped using the usual sterile technique with Betadine.  Surgical site confirmation was performed.  The patient had a large right inguinal hernia.  This was reduced.  I then made an incision in the right groin region down to the external oblique aponeurosis.  The aponeurosis was incised to the external ring.  A Penrose drain was placed around the spermatic cord.  The vas deferens was noted within the spermatic cord.  The patient had a large indirect hernia sac.  This was freed away from the spermatic cord up to the peritoneal reflection and inverted.  A large Bard PerFix plug was then placed.  Care was taken to avoid the ilioinguinal nerve.  An onlay patch was placed along the floor of the inguinal canal and secured superiorly to the conjoined tendon and inferiorly to the shelving edge of Poupart's ligament using 2-0 Novafil interrupted sutures.  The internal ring was recreated using a 2-0 Novafil interrupted suture.  Robynn Pane was placed over the mesh and in the subcutaneous tissue.  The external oblique aponeurosis was reapproximated using a 2-0 Vicryl running suture.  The subcutaneous layer was reapproximated using 3-0 Vicryl interrupted sutures.  The skin was closed using a 4-0 Monocryl subcuticular  suture.  Dermabond was applied.  All tape and needle counts were correct at the end of the procedure.  The patient was extubated in the operating room and transferred to PACU in stable condition.  Complications: None  EBL: Minimal  Specimen: None

## 2021-03-12 NOTE — Anesthesia Procedure Notes (Signed)
Procedure Name: Intubation Date/Time: 03/12/2021 10:06 AM Performed by: Maude Leriche, CRNA Pre-anesthesia Checklist: Patient identified, Emergency Drugs available, Suction available and Patient being monitored Patient Re-evaluated:Patient Re-evaluated prior to induction Oxygen Delivery Method: Circle system utilized Preoxygenation: Pre-oxygenation with 100% oxygen Induction Type: IV induction Ventilation: Mask ventilation without difficulty Laryngoscope Size: Miller and 2 Grade View: Grade I Tube type: Oral Tube size: 7.5 mm Number of attempts: 1 Placement Confirmation: ETT inserted through vocal cords under direct vision, positive ETCO2 and breath sounds checked- equal and bilateral Secured at: 23 cm Tube secured with: Tape Dental Injury: Teeth and Oropharynx as per pre-operative assessment

## 2021-03-12 NOTE — Anesthesia Preprocedure Evaluation (Addendum)
Anesthesia Evaluation  Patient identified by MRN, date of birth, ID band Patient awake    Reviewed: Allergy & Precautions, NPO status , Patient's Chart, lab work & pertinent test results  History of Anesthesia Complications Negative for: history of anesthetic complications  Airway Mallampati: II  TM Distance: >3 FB Neck ROM: Full    Dental  (+) Edentulous Upper, Edentulous Lower   Pulmonary COPD,  COPD inhaler, Current Smoker and Patient abstained from smoking.,    Pulmonary exam normal breath sounds clear to auscultation       Cardiovascular hypertension, Normal cardiovascular exam Rhythm:Regular Rate:Normal     Neuro/Psych negative neurological ROS  negative psych ROS   GI/Hepatic Neg liver ROS, GERD  ,  Endo/Other  negative endocrine ROS  Renal/GU negative Renal ROS  negative genitourinary   Musculoskeletal  (+) Arthritis ,   Abdominal   Peds negative pediatric ROS (+)  Hematology  (+) Blood dyscrasia, anemia ,   Anesthesia Other Findings Lower GI bleed COPD (chronic obstructive pulmonary disease) (HCC) Essential hypertension Near syncope Syncope and collapse 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 Acute blood loss anemia Odynophagia Gastroesophageal reflux disease without esophagitis Non-intractable vomiting Rectal bleeding Colitis BPH with urinary obstruction    Reproductive/Obstetrics negative OB ROS                          Anesthesia Physical Anesthesia Plan  ASA: 2  Anesthesia Plan: General   Post-op Pain Management: Dilaudid IV   Induction: Intravenous  PONV Risk Score and Plan: 3 and Ondansetron and Dexamethasone  Airway Management Planned: Oral ETT  Additional Equipment:   Intra-op Plan:   Post-operative Plan: Extubation in OR  Informed Consent: I have reviewed the patients History and Physical, chart, labs and discussed the  procedure including the risks, benefits and alternatives for the proposed anesthesia with the patient or authorized representative who has indicated his/her understanding and acceptance.       Plan Discussed with: CRNA and Surgeon  Anesthesia Plan Comments:        Anesthesia Quick Evaluation

## 2021-03-13 ENCOUNTER — Encounter (HOSPITAL_COMMUNITY): Payer: Self-pay | Admitting: General Surgery

## 2021-03-20 ENCOUNTER — Other Ambulatory Visit: Payer: Self-pay

## 2021-03-20 ENCOUNTER — Encounter: Payer: Self-pay | Admitting: General Surgery

## 2021-03-20 ENCOUNTER — Ambulatory Visit (INDEPENDENT_AMBULATORY_CARE_PROVIDER_SITE_OTHER): Payer: Medicare HMO | Admitting: General Surgery

## 2021-03-20 VITALS — BP 138/63 | HR 82 | Temp 98.3°F | Resp 16 | Ht 66.0 in | Wt 188.0 lb

## 2021-03-20 DIAGNOSIS — Z09 Encounter for follow-up examination after completed treatment for conditions other than malignant neoplasm: Secondary | ICD-10-CM

## 2021-03-20 NOTE — Progress Notes (Signed)
Subjective:     Randy Russo  Patient here for postoperative visit, status post right inguinal herniorrhaphy with mesh.  He is doing well.  He has no complaints. Objective:    BP 138/63    Pulse 82    Temp 98.3 F (36.8 C) (Oral)    Resp 16    Ht 5\' 6"  (1.676 m)    Wt 188 lb (85.3 kg)    SpO2 98%    BMI 30.34 kg/m   General:  alert, cooperative, and no distress  Valuable incision healing well.  He has been putting Vaseline on the wound.  Scrotal swelling much less than preop.  Some swelling of the spermatic cord noted, but within normal limits.     Assessment:    Doing well postoperatively.    Plan:   I told him to stop placing Vaseline on the wound.  He should not lift anything heavy for the next 2 weeks.  He can then resume gradually normal activity.  Follow-up here as needed.

## 2021-06-14 ENCOUNTER — Other Ambulatory Visit: Payer: Medicare HMO

## 2021-06-21 ENCOUNTER — Ambulatory Visit: Payer: Medicare HMO | Admitting: Urology

## 2021-06-21 ENCOUNTER — Other Ambulatory Visit: Payer: Medicare HMO

## 2021-06-28 ENCOUNTER — Ambulatory Visit: Payer: Medicare HMO | Admitting: Urology

## 2021-08-23 DIAGNOSIS — E663 Overweight: Secondary | ICD-10-CM | POA: Diagnosis not present

## 2021-08-23 DIAGNOSIS — M1711 Unilateral primary osteoarthritis, right knee: Secondary | ICD-10-CM | POA: Diagnosis not present

## 2021-08-23 DIAGNOSIS — Z6828 Body mass index (BMI) 28.0-28.9, adult: Secondary | ICD-10-CM | POA: Diagnosis not present

## 2021-09-21 DIAGNOSIS — E782 Mixed hyperlipidemia: Secondary | ICD-10-CM | POA: Diagnosis not present

## 2021-09-21 DIAGNOSIS — J449 Chronic obstructive pulmonary disease, unspecified: Secondary | ICD-10-CM | POA: Diagnosis not present

## 2021-10-13 ENCOUNTER — Other Ambulatory Visit: Payer: Self-pay

## 2021-10-13 ENCOUNTER — Emergency Department (HOSPITAL_COMMUNITY): Payer: Medicare HMO

## 2021-10-13 ENCOUNTER — Encounter (HOSPITAL_COMMUNITY): Payer: Self-pay | Admitting: Emergency Medicine

## 2021-10-13 ENCOUNTER — Emergency Department (HOSPITAL_COMMUNITY)
Admission: EM | Admit: 2021-10-13 | Discharge: 2021-10-13 | Disposition: A | Discharge status: Home / Self Care | Payer: Medicare HMO | Attending: Emergency Medicine | Admitting: Emergency Medicine

## 2021-10-13 DIAGNOSIS — S40011A Contusion of right shoulder, initial encounter: Secondary | ICD-10-CM | POA: Insufficient documentation

## 2021-10-13 DIAGNOSIS — M5134 Other intervertebral disc degeneration, thoracic region: Secondary | ICD-10-CM | POA: Diagnosis not present

## 2021-10-13 DIAGNOSIS — R609 Edema, unspecified: Secondary | ICD-10-CM | POA: Diagnosis not present

## 2021-10-13 DIAGNOSIS — Z043 Encounter for examination and observation following other accident: Secondary | ICD-10-CM | POA: Diagnosis not present

## 2021-10-13 DIAGNOSIS — W19XXXA Unspecified fall, initial encounter: Secondary | ICD-10-CM | POA: Diagnosis not present

## 2021-10-13 DIAGNOSIS — S4991XA Unspecified injury of right shoulder and upper arm, initial encounter: Secondary | ICD-10-CM | POA: Diagnosis present

## 2021-10-13 DIAGNOSIS — S4981XA Other specified injuries of right shoulder and upper arm, initial encounter: Secondary | ICD-10-CM | POA: Diagnosis not present

## 2021-10-13 HISTORY — DX: Diverticulitis of intestine, part unspecified, without perforation or abscess without bleeding: K57.92

## 2021-10-13 MED ORDER — NAPROXEN 500 MG PO TABS: 500.0000 mg | ORAL_TABLET | Freq: Two times a day (BID) | ORAL | 0 refills | Status: DC

## 2021-10-13 MED ORDER — NAPROXEN 250 MG PO TABS
500.0000 mg | ORAL_TABLET | Freq: Once | ORAL | Status: AC
  Administered 2021-10-13: 500 mg via ORAL
  Filled 2021-10-13: qty 2

## 2021-10-13 NOTE — ED Triage Notes (Signed)
Patient brought in via EMS from home for fall. Patient alert and oriented. Airway patent. Patient has had x2 falls in past week in which he has fallen onto right shoulder. Denies hitting head or LOC. Denies taking any type of anticoagulant. Patient fell due to "knee giving out" which daughter states he is to get full knee replacement soon. Patient also has not been using walker for ambulating. Patient unable to lift right arm. Radial pulse present, capillary refill WNL, hand warm. Patients grip strong.

## 2021-10-13 NOTE — ED Provider Notes (Signed)
[facility] EMERGENCY DEPARTMENT Provider Note   CSN: [id number] Arrival date & time: 10/13/21  1539     History  Chief Complaint  Patient presents with   Randy Russo is a 81 y.o. male.   Fall   This patient is a very pleasant 81 year old male, he is not anticoagulated, he presents to the hospital today after having a couple of different falls this week onto his right shoulder.  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  Unfortunately he has taken the brunt of the injury on his right shoulder laterally with each of these falls.  He now has a swollen tender right shoulder.  He denies any other symptoms, he has no numbness or weakness of his arm but has decreased range of motion secondary to pain in the shoulder.  No head injury no neck pain, no swelling in the legs, he has not injured his hip and is now ambulatory without difficulty    Home Medications Prior to Admission medications   Medication Sig Start Date End Date Taking? Authorizing Provider  naproxen (NAPROSYN) 500 MG tablet Take 1 tablet (500 mg total) by mouth 2 (two) times daily with a meal. 10/13/21  Yes Noemi Chapel, MD  albuterol (PROVENTIL) (2.5 MG/3ML) 0.083% nebulizer solution Take 2.5 mg by nebulization every 6 (six) hours as needed for wheezing or shortness of breath.    [provider]  albuterol (VENTOLIN HFA) 108 (90 Base) MCG/ACT inhaler Inhale 1-2 puffs into the lungs every 6 (six) hours as needed for wheezing or shortness of breath.    [provider]  cholecalciferol (VITAMIN D) 400 units TABS tablet Take 400 Units by mouth daily.     [provider]  LUMIGAN 0.01 % SOLN  08/31/19   [provider]  Omega-3 Fatty Acids (FISH OIL) 1200 MG CAPS Take 1,200 mg by mouth daily.    [provider]  silodosin (RAPAFLO) 8 MG CAPS capsule Take 1 capsule (8 mg total) by mouth daily with breakfast.  12/28/20   Irine Seal, MD  [facility] HANDIHALER 18 MCG inhalation capsule  04/13/19   [provider]  traMADol (ULTRAM) 50 MG tablet Take 1 tablet (50 mg total) by mouth every 6 (six) hours as needed. 03/12/21   Aviva Signs, MD      Allergies    Patient has no known allergies.    Review of Systems   Review of Systems  All other systems reviewed and are negative.   Physical Exam Updated Vital Signs BP 117/85   Pulse 87   Temp 98 F (36.7 C) (Oral)   Resp 16   Ht 1.676 m ('5\' 6"'$ )   Wt 81.6 kg   SpO2 92%   BMI 29.05 kg/m  Physical Exam Vitals and nursing note reviewed.  Constitutional:      General: He is not in acute distress.    Appearance: He is well-developed.  HENT:     Head: Normocephalic and atraumatic.     Mouth/Throat:     Pharynx: No oropharyngeal exudate.  Eyes:     General: No scleral icterus.       Right eye: No discharge.        Left eye: No discharge.     Conjunctiva/sclera: Conjunctivae normal.     Pupils: Pupils are equal, round, and reactive to light.  Neck:  Thyroid: No thyromegaly.     Vascular: No JVD.  Cardiovascular:     Rate and Rhythm: Normal rate and regular rhythm.     Heart sounds: Normal heart sounds. No murmur heard.    No friction rub. No gallop.  Pulmonary:     Effort: Pulmonary effort is normal. No respiratory distress.     Breath sounds: Normal breath sounds. No wheezing or rales.  Abdominal:     General: Bowel sounds are normal. There is no distension.     Palpations: Abdomen is soft. There is no mass.     Tenderness: There is no abdominal tenderness.  Musculoskeletal:        General: Swelling and tenderness present. Normal range of motion.     Cervical back: Normal range of motion and neck supple.     Right lower leg: No edema.     Left lower leg: No edema.     Comments: Decreased range of motion of the right shoulder, all other joints have normal range of motion.  He has swelling and warmth to the right shoulder,  there is no significant redness, no induration, no bony tenderness over the clavicle  Lymphadenopathy:     Cervical: No cervical adenopathy.  Skin:    General: Skin is warm and dry.     Findings: No erythema or rash.  Neurological:     Mental Status: He is alert.     Coordination: Coordination normal.     Comments: Normal strength and sensation of the right hand, normal pulses at the right wrist  Psychiatric:        Behavior: Behavior normal.     ED Results / Procedures / Treatments   Labs (all labs ordered are listed, but only abnormal results are displayed) Labs Reviewed - No data to display  EKG None  Radiology DG Shoulder Right  Result Date: 10/13/2021 CLINICAL DATA:  Fall. Fall onto right shoulder 2 times this past week. EXAM: RIGHT SHOULDER - 2+ VIEW COMPARISON:  None Available. FINDINGS: Moderate glenohumeral joint space narrowing and peripheral osteophytosis. Moderate to severe acromioclavicular joint space narrowing and peripheral osteophytosis. Mild distal lateral subacromial spurring. No acute fracture is seen. No dislocation. Moderate multilevel degenerative disc and endplate changes of the thoracic spine. IMPRESSION: 1. No acute fracture. 2. 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 Electronically Signed   By: Yvonne Kendall M.D.   On: 10/13/2021 16:48    Procedures Procedures    Medications Ordered in ED Medications  naproxen (NAPROSYN) tablet 500 mg (has no administration in time range)    ED Course/ Medical Decision Making/ A&P                           Medical Decision Making Amount and/or Complexity of Data Reviewed Radiology: ordered.  Risk Prescription drug management.   This patient has what appears to be a hematoma surrounding the right shoulder but no signs of bony abnormalities.  Imaging: I personally viewed and interpreted the x-rays which showed no signs of fractures or dislocations, there is arthritis in  the shoulder.  ED course: Patient placed in a sling by nursing, normal neurovascular function  Patient given an anti-inflammatory and will be discharged with same.  Stable for discharge to follow-up with orthopedics.        Final Clinical Impression(s) / ED Diagnoses Final diagnoses:  Traumatic hematoma of right shoulder, initial encounter    Rx / DC Orders  ED Discharge Orders          Ordered    naproxen (NAPROSYN) 500 MG tablet  2 times daily with meals        10/13/21 1744              Noemi Chapel, MD 10/13/21 1745

## 2021-10-13 NOTE — Discharge Instructions (Addendum)
Please take Naprosyn, '500mg'$  by mouth twice daily as needed for pain - this in an antiinflammatory medicine (NSAID) and is similar to ibuprofen - many people feel that it is stronger than ibuprofen and it is easier to take since it is a smaller pill.  Please use this only for 1 week - if your pain persists, you will need to follow up with your doctor in the office for ongoing guidance and pain control.  Wear the sling For comfort  ER for severe worsening symptoms including increasing pain numbness weakness or any other severe or worsening conditions  Follow-up with Dr. Aline Brochure within the week for a recheck  Place an ice pack on the area that is swollen, make sure the ice pack is wrapped in a towel and keep it on the skin no longer than 15 minutes, do this multiple times per day

## 2021-10-29 DIAGNOSIS — M25561 Pain in right knee: Secondary | ICD-10-CM | POA: Diagnosis not present

## 2021-10-29 DIAGNOSIS — M25511 Pain in right shoulder: Secondary | ICD-10-CM | POA: Diagnosis not present

## 2021-10-29 DIAGNOSIS — M1711 Unilateral primary osteoarthritis, right knee: Secondary | ICD-10-CM | POA: Diagnosis not present

## 2021-10-31 ENCOUNTER — Inpatient Hospital Stay (HOSPITAL_COMMUNITY)
Admission: EM | Admit: 2021-10-31 | Discharge: 2021-11-03 | DRG: 379 | Disposition: A | Discharge status: Home / Self Care | Payer: Medicare HMO | Attending: Internal Medicine | Admitting: Internal Medicine

## 2021-10-31 ENCOUNTER — Other Ambulatory Visit: Payer: Self-pay

## 2021-10-31 ENCOUNTER — Encounter (HOSPITAL_COMMUNITY): Payer: Self-pay

## 2021-10-31 DIAGNOSIS — Z833 Family history of diabetes mellitus: Secondary | ICD-10-CM

## 2021-10-31 DIAGNOSIS — K625 Hemorrhage of anus and rectum: Principal | ICD-10-CM

## 2021-10-31 DIAGNOSIS — K219 Gastro-esophageal reflux disease without esophagitis: Secondary | ICD-10-CM | POA: Diagnosis present

## 2021-10-31 DIAGNOSIS — Z96652 Presence of left artificial knee joint: Secondary | ICD-10-CM | POA: Diagnosis present

## 2021-10-31 DIAGNOSIS — I959 Hypotension, unspecified: Secondary | ICD-10-CM | POA: Diagnosis present

## 2021-10-31 DIAGNOSIS — I10 Essential (primary) hypertension: Secondary | ICD-10-CM | POA: Diagnosis present

## 2021-10-31 DIAGNOSIS — Z981 Arthrodesis status: Secondary | ICD-10-CM

## 2021-10-31 DIAGNOSIS — K922 Gastrointestinal hemorrhage, unspecified: Secondary | ICD-10-CM | POA: Diagnosis present

## 2021-10-31 DIAGNOSIS — L8990 Pressure ulcer of unspecified site, unspecified stage: Secondary | ICD-10-CM | POA: Insufficient documentation

## 2021-10-31 DIAGNOSIS — K5731 Diverticulosis of large intestine without perforation or abscess with bleeding: Principal | ICD-10-CM | POA: Diagnosis present

## 2021-10-31 DIAGNOSIS — Z9049 Acquired absence of other specified parts of digestive tract: Secondary | ICD-10-CM | POA: Diagnosis not present

## 2021-10-31 DIAGNOSIS — Z8719 Personal history of other diseases of the digestive system: Secondary | ICD-10-CM | POA: Diagnosis not present

## 2021-10-31 DIAGNOSIS — Z791 Long term (current) use of non-steroidal anti-inflammatories (NSAID): Secondary | ICD-10-CM | POA: Diagnosis not present

## 2021-10-31 DIAGNOSIS — Z87891 Personal history of nicotine dependence: Secondary | ICD-10-CM

## 2021-10-31 DIAGNOSIS — J449 Chronic obstructive pulmonary disease, unspecified: Secondary | ICD-10-CM | POA: Diagnosis present

## 2021-10-31 DIAGNOSIS — Z96641 Presence of right artificial hip joint: Secondary | ICD-10-CM | POA: Diagnosis present

## 2021-10-31 DIAGNOSIS — N400 Enlarged prostate without lower urinary tract symptoms: Secondary | ICD-10-CM | POA: Diagnosis present

## 2021-10-31 DIAGNOSIS — R296 Repeated falls: Secondary | ICD-10-CM | POA: Diagnosis present

## 2021-10-31 DIAGNOSIS — Z79899 Other long term (current) drug therapy: Secondary | ICD-10-CM

## 2021-10-31 DIAGNOSIS — M25511 Pain in right shoulder: Secondary | ICD-10-CM | POA: Diagnosis present

## 2021-10-31 DIAGNOSIS — L89892 Pressure ulcer of other site, stage 2: Secondary | ICD-10-CM | POA: Diagnosis present

## 2021-10-31 LAB — CBC WITH DIFFERENTIAL/PLATELET
Abs Immature Granulocytes: 0.16 10*3/uL — ABNORMAL HIGH (ref 0.00–0.07)
Basophils Absolute: 0 10*3/uL (ref 0.0–0.1)
Basophils Relative: 0 %
Eosinophils Absolute: 0 10*3/uL (ref 0.0–0.5)
Eosinophils Relative: 0 %
HCT: 23.8 % — ABNORMAL LOW (ref 39.0–52.0)
Hemoglobin: 7.6 g/dL — ABNORMAL LOW (ref 13.0–17.0)
Immature Granulocytes: 1 %
Lymphocytes Relative: 7 %
Lymphs Abs: 0.9 10*3/uL (ref 0.7–4.0)
MCH: 30 pg (ref 26.0–34.0)
MCHC: 31.9 g/dL (ref 30.0–36.0)
MCV: 94.1 fL (ref 80.0–100.0)
Monocytes Absolute: 1.2 10*3/uL — ABNORMAL HIGH (ref 0.1–1.0)
Monocytes Relative: 9 %
Neutro Abs: 11.2 10*3/uL — ABNORMAL HIGH (ref 1.7–7.7)
Neutrophils Relative %: 83 %
Platelets: 417 10*3/uL — ABNORMAL HIGH (ref 150–400)
RBC: 2.53 MIL/uL — ABNORMAL LOW (ref 4.22–5.81)
RDW: 14.1 % (ref 11.5–15.5)
WBC: 13.5 10*3/uL — ABNORMAL HIGH (ref 4.0–10.5)
nRBC: 0 % (ref 0.0–0.2)

## 2021-10-31 LAB — PREPARE RBC (CROSSMATCH)

## 2021-10-31 LAB — COMPREHENSIVE METABOLIC PANEL
ALT: 26 U/L (ref 0–44)
AST: 20 U/L (ref 15–41)
Albumin: 2.1 g/dL — ABNORMAL LOW (ref 3.5–5.0)
Alkaline Phosphatase: 80 U/L (ref 38–126)
Anion gap: 7 (ref 5–15)
BUN: 20 mg/dL (ref 8–23)
CO2: 24 mmol/L (ref 22–32)
Calcium: 8.2 mg/dL — ABNORMAL LOW (ref 8.9–10.3)
Chloride: 107 mmol/L (ref 98–111)
Creatinine, Ser: 1.15 mg/dL (ref 0.61–1.24)
GFR, Estimated: >60 mL/min (ref >60)
Glucose, Bld: 138 mg/dL — ABNORMAL HIGH (ref 70–99)
Potassium: 4.6 mmol/L (ref 3.5–5.1)
Sodium: 138 mmol/L (ref 135–145)
Total Bilirubin: 1.1 mg/dL (ref 0.3–1.2)
Total Protein: 6.5 g/dL (ref 6.5–8.1)

## 2021-10-31 LAB — PROTIME-INR
INR: 1.2 (ref 0.8–1.2)
Prothrombin Time: 15.1 seconds (ref 11.4–15.2)

## 2021-10-31 LAB — POC OCCULT BLOOD, ED: Fecal Occult Bld: POSITIVE — AB

## 2021-10-31 MED ORDER — SODIUM CHLORIDE 0.9 % IV SOLN
10.0000 mL/h | Freq: Once | INTRAVENOUS | Status: AC
  Administered 2021-11-01: 10 mL/h via INTRAVENOUS

## 2021-10-31 MED ORDER — SODIUM CHLORIDE 0.9 % IV BOLUS
500.0000 mL | Freq: Once | INTRAVENOUS | Status: AC
  Administered 2021-10-31: 500 mL via INTRAVENOUS

## 2021-10-31 NOTE — ED Triage Notes (Signed)
Pt presents with rectal bleed that started today when daughter went to check on him. Pt is weak and not able to eat. Clots were being passed the daughter stated. Pt is alert and orientated and lives by himself. Able to ambulate with walker.

## 2021-10-31 NOTE — ED Provider Notes (Signed)
[facility] EMERGENCY DEPARTMENT Provider Note   CSN: [id number] Arrival date & time: 10/31/21  2207     History {Add pertinent medical, surgical, social history, OB history to HPI:1} No chief complaint on file.   Randy Russo is a 81 y.o. male.  HPI Patient presents for evaluation of rectal bleeding that started today.  He lives alone.  His daughter found him on the floor next to the commode, with blood clots scattered around on the commode and on the floor.  He has history of prior rectal bleeding from diverticular disease.  He has been doing well lately, able to function at home, and care for himself.  His daughter states that she thinks he is confused today and weak because of the blood loss.  It is not suspected that he had significant trauma.  Family members were able to bring him here for evaluation.  Patient cannot give any history.    Home Medications Prior to Admission medications   Medication Sig Start Date End Date Taking? Authorizing Provider  albuterol (PROVENTIL) (2.5 MG/3ML) 0.083% nebulizer solution Take 2.5 mg by nebulization every 6 (six) hours as needed for wheezing or shortness of breath.    [provider]  albuterol (VENTOLIN HFA) 108 (90 Base) MCG/ACT inhaler Inhale 1-2 puffs into the lungs every 6 (six) hours as needed for wheezing or shortness of breath.    [provider]  cholecalciferol (VITAMIN D) 400 units TABS tablet Take 400 Units by mouth daily.     [provider]  LUMIGAN 0.01 % SOLN  08/31/19   [provider]  naproxen (NAPROSYN) 500 MG tablet Take 1 tablet (500 mg total) by mouth 2 (two) times daily with a meal. 10/13/21   Noemi Chapel, MD  Omega-3 Fatty Acids (FISH OIL) 1200 MG CAPS Take 1,200 mg by mouth daily.    [provider]  silodosin (RAPAFLO) 8 MG CAPS capsule Take 1 capsule (8 mg total) by mouth daily with breakfast. 12/28/20   Irine Seal, MD  [facility] HANDIHALER 18 MCG inhalation capsule   04/13/19   [provider]  traMADol (ULTRAM) 50 MG tablet Take 1 tablet (50 mg total) by mouth every 6 (six) hours as needed. 03/12/21   Aviva Signs, MD      Allergies    Patient has no known allergies.    Review of Systems   Review of Systems  Physical Exam Updated Vital Signs BP (!) 104/57   Pulse 91   Temp 98.6 F (37 C) (Oral)   Resp (!) 31   Ht '5\' 6"'$  (1.676 m)   Wt 82 kg   SpO2 99%   BMI 29.18 kg/m  Physical Exam Vitals and nursing note reviewed.  Constitutional:      Appearance: He is well-developed.  HENT:     Head: Normocephalic and atraumatic.     Right Ear: External ear normal.     Left Ear: External ear normal.  Eyes:     Conjunctiva/sclera: Conjunctivae normal.     Pupils: Pupils are equal, round, and reactive to light.  Neck:     Trachea: Phonation normal.  Cardiovascular:     Rate and Rhythm: Normal rate.  Pulmonary:     Effort: Pulmonary effort is normal.  Abdominal:     General: There is no distension.  Musculoskeletal:        General: Normal range of motion.     Cervical back: Normal range of motion and neck supple.  Skin:    General: Skin is warm and dry.  Neurological:     Mental Status: He is alert.     Cranial Nerves: No cranial nerve deficit.     Sensory: No sensory deficit.     Motor: No abnormal muscle tone.     Coordination: Coordination normal.  Psychiatric:        Mood and Affect: Mood normal.        Behavior: Behavior normal.     ED Results / Procedures / Treatments   Labs (all labs ordered are listed, but only abnormal results are displayed) Labs Reviewed  COMPREHENSIVE METABOLIC PANEL - Abnormal; Notable for the following components:      Result Value   Glucose, Bld 138 (*)    Calcium 8.2 (*)    Albumin 2.1 (*)    All other components within normal limits  CBC WITH DIFFERENTIAL/PLATELET - Abnormal; Notable for the following components:   WBC 13.5 (*)    RBC 2.53 (*)    Hemoglobin 7.6 (*)    HCT 23.8 (*)     Platelets 417 (*)    Neutro Abs 11.2 (*)    Monocytes Absolute 1.2 (*)    Abs Immature Granulocytes 0.16 (*)    All other components within normal limits  POC OCCULT BLOOD, ED - Abnormal; Notable for the following components:   Fecal Occult Bld POSITIVE (*)    All other components within normal limits  PROTIME-INR  TYPE AND SCREEN  PREPARE RBC (CROSSMATCH)    EKG EKG Interpretation  Date/Time:  Wednesday October 31 2021 22:20:31 EDT Ventricular Rate:  93 PR Interval:  149 QRS Duration: 76 QT Interval:  364 QTC Calculation: 453 R Axis:   64 Text Interpretation: Sinus rhythm Low voltage, precordial leads Probable anteroseptal infarct, old since last tracing no significant change Confirmed by Daleen Bo [phone]) on 10/31/2021 10:29:40 PM  Radiology No results found.  Procedures Procedures  {Document cardiac monitor, telemetry assessment procedure when appropriate:1}  Medications Ordered in ED Medications  0.9 %  sodium chloride infusion (has no administration in time range)  sodium chloride 0.9 % bolus 500 mL (500 mLs Intravenous New Bag/Given 10/31/21 2239)    ED Course/ Medical Decision Making/ A&P Clinical Course as of 10/31/21 2346  Wed Oct 31, 2021  2248 Initial hemoglobin low at 7.6.  Baseline 9.6, 2 years ago.  Stool positive for rectal bleeding.  Blood transfusion 1 unit ordered. [EW]  3151 At this time blood pressure is improved to 98/42.  He remains alert and comfortable. [EW]    Clinical Course User Index [EW] Daleen Bo, MD                           Medical Decision Making Patient presenting for rectal bleeding with clots, hypotension and tachycardia.  History of diverticular bleeding.  Initial resuscitation with IV fluids, blood type ordered, in case he needs transfusion.  Amount and/or Complexity of Data Reviewed Independent Historian:     Details: Daughter at bedside gives history Labs: ordered. Discussion of management or test interpretation  with external provider(s): Case discussed with hospitalist to arrange admission.  Risk Prescription drug management. Risk Details: Patient presenting for evaluation of rectal bleeding, found to have decreased hemoglobin from baseline and blood in the rectum.  Initially blood pressure low and heart rate high.  These improved with IV fluids.  He was treated with blood transfusion.  Patient requires admission.  Anticipate he will need GI consultation after hospitalization.  Critical Care Total time providing critical care: 40 minutes   ***  {Document critical care time when appropriate:1} {Document review of labs and clinical decision tools ie heart score, Chads2Vasc2 etc:1}  {Document your independent review of radiology images, and any outside records:1} {Document your discussion with family members, caretakers, and with consultants:1} {Document social determinants of health affecting pt's care:1} {Document your decision making why or why not admission, treatments were needed:1} Final Clinical Impression(s) / ED Diagnoses Final diagnoses:  Rectal bleeding    Rx / DC Orders ED Discharge Orders     None

## 2021-11-01 ENCOUNTER — Encounter (HOSPITAL_COMMUNITY): Payer: Self-pay | Admitting: Internal Medicine

## 2021-11-01 DIAGNOSIS — J449 Chronic obstructive pulmonary disease, unspecified: Secondary | ICD-10-CM

## 2021-11-01 DIAGNOSIS — Z79899 Other long term (current) drug therapy: Secondary | ICD-10-CM | POA: Diagnosis not present

## 2021-11-01 DIAGNOSIS — N400 Enlarged prostate without lower urinary tract symptoms: Secondary | ICD-10-CM | POA: Diagnosis present

## 2021-11-01 DIAGNOSIS — M25511 Pain in right shoulder: Secondary | ICD-10-CM | POA: Diagnosis present

## 2021-11-01 DIAGNOSIS — K625 Hemorrhage of anus and rectum: Secondary | ICD-10-CM | POA: Diagnosis present

## 2021-11-01 DIAGNOSIS — Z8719 Personal history of other diseases of the digestive system: Secondary | ICD-10-CM | POA: Diagnosis not present

## 2021-11-01 DIAGNOSIS — L8990 Pressure ulcer of unspecified site, unspecified stage: Secondary | ICD-10-CM | POA: Insufficient documentation

## 2021-11-01 DIAGNOSIS — Z96652 Presence of left artificial knee joint: Secondary | ICD-10-CM | POA: Diagnosis present

## 2021-11-01 DIAGNOSIS — K5731 Diverticulosis of large intestine without perforation or abscess with bleeding: Secondary | ICD-10-CM | POA: Diagnosis not present

## 2021-11-01 DIAGNOSIS — Z87891 Personal history of nicotine dependence: Secondary | ICD-10-CM | POA: Diagnosis not present

## 2021-11-01 DIAGNOSIS — L89892 Pressure ulcer of other site, stage 2: Secondary | ICD-10-CM | POA: Diagnosis present

## 2021-11-01 DIAGNOSIS — Z9049 Acquired absence of other specified parts of digestive tract: Secondary | ICD-10-CM | POA: Diagnosis not present

## 2021-11-01 DIAGNOSIS — I10 Essential (primary) hypertension: Secondary | ICD-10-CM | POA: Diagnosis present

## 2021-11-01 DIAGNOSIS — K922 Gastrointestinal hemorrhage, unspecified: Secondary | ICD-10-CM | POA: Diagnosis not present

## 2021-11-01 DIAGNOSIS — Z981 Arthrodesis status: Secondary | ICD-10-CM | POA: Diagnosis not present

## 2021-11-01 DIAGNOSIS — I959 Hypotension, unspecified: Secondary | ICD-10-CM | POA: Diagnosis present

## 2021-11-01 DIAGNOSIS — Z833 Family history of diabetes mellitus: Secondary | ICD-10-CM | POA: Diagnosis not present

## 2021-11-01 DIAGNOSIS — Z96641 Presence of right artificial hip joint: Secondary | ICD-10-CM | POA: Diagnosis present

## 2021-11-01 DIAGNOSIS — R296 Repeated falls: Secondary | ICD-10-CM | POA: Diagnosis present

## 2021-11-01 DIAGNOSIS — Z791 Long term (current) use of non-steroidal anti-inflammatories (NSAID): Secondary | ICD-10-CM | POA: Diagnosis not present

## 2021-11-01 DIAGNOSIS — K219 Gastro-esophageal reflux disease without esophagitis: Secondary | ICD-10-CM | POA: Diagnosis present

## 2021-11-01 LAB — HEMOGLOBIN AND HEMATOCRIT, BLOOD
HCT: 21.3 % — ABNORMAL LOW (ref 39.0–52.0)
HCT: 23 % — ABNORMAL LOW (ref 39.0–52.0)
Hemoglobin: 6.9 g/dL — CL (ref 13.0–17.0)
Hemoglobin: 7.6 g/dL — ABNORMAL LOW (ref 13.0–17.0)

## 2021-11-01 LAB — CBC
HCT: 21.3 % — ABNORMAL LOW (ref 39.0–52.0)
Hemoglobin: 7 g/dL — ABNORMAL LOW (ref 13.0–17.0)
MCH: 29.9 pg (ref 26.0–34.0)
MCHC: 32.9 g/dL (ref 30.0–36.0)
MCV: 91 fL (ref 80.0–100.0)
Platelets: 299 10*3/uL (ref 150–400)
RBC: 2.34 MIL/uL — ABNORMAL LOW (ref 4.22–5.81)
RDW: 15.5 % (ref 11.5–15.5)
WBC: 9.4 10*3/uL (ref 4.0–10.5)
nRBC: 0 % (ref 0.0–0.2)

## 2021-11-01 LAB — PREPARE RBC (CROSSMATCH)

## 2021-11-01 MED ORDER — SODIUM CHLORIDE 0.9 % IV SOLN: INTRAVENOUS | Status: DC

## 2021-11-01 MED ORDER — UMECLIDINIUM BROMIDE 62.5 MCG/ACT IN AEPB
1.0000 | INHALATION_SPRAY | Freq: Every day | RESPIRATORY_TRACT | Status: DC
  Administered 2021-11-01 – 2021-11-03 (×3): 1 via RESPIRATORY_TRACT
  Filled 2021-11-01: qty 7

## 2021-11-01 MED ORDER — ALBUTEROL SULFATE (2.5 MG/3ML) 0.083% IN NEBU: 3.0000 mL | INHALATION_SOLUTION | Freq: Four times a day (QID) | RESPIRATORY_TRACT | Status: DC | PRN

## 2021-11-01 MED ORDER — TIOTROPIUM BROMIDE MONOHYDRATE 18 MCG IN CAPS: 18.0000 ug | ORAL_CAPSULE | Freq: Every day | RESPIRATORY_TRACT | Status: DC

## 2021-11-01 MED ORDER — PANTOPRAZOLE SODIUM 40 MG PO TBEC
40.0000 mg | DELAYED_RELEASE_TABLET | Freq: Every day | ORAL | Status: DC
  Administered 2021-11-01 – 2021-11-03 (×3): 40 mg via ORAL
  Filled 2021-11-01 (×3): qty 1

## 2021-11-01 MED ORDER — ACETAMINOPHEN 325 MG PO TABS: 650.0000 mg | ORAL_TABLET | Freq: Four times a day (QID) | ORAL | Status: DC | PRN

## 2021-11-01 MED ORDER — TRAMADOL HCL 50 MG PO TABS
50.0000 mg | ORAL_TABLET | Freq: Four times a day (QID) | ORAL | Status: DC | PRN
  Administered 2021-11-01: 50 mg via ORAL
  Filled 2021-11-01: qty 1

## 2021-11-01 MED ORDER — SODIUM CHLORIDE 0.9% IV SOLUTION: Freq: Once | INTRAVENOUS | Status: AC

## 2021-11-01 MED ORDER — ACETAMINOPHEN 650 MG RE SUPP: 650.0000 mg | Freq: Four times a day (QID) | RECTAL | Status: DC | PRN

## 2021-11-01 MED ORDER — TAMSULOSIN HCL 0.4 MG PO CAPS
0.4000 mg | ORAL_CAPSULE | Freq: Every day | ORAL | Status: DC
  Administered 2021-11-01 – 2021-11-03 (×3): 0.4 mg via ORAL
  Filled 2021-11-01 (×3): qty 1

## 2021-11-01 MED ORDER — NAPROXEN 250 MG PO TABS
500.0000 mg | ORAL_TABLET | Freq: Two times a day (BID) | ORAL | Status: DC
  Administered 2021-11-01: 500 mg via ORAL
  Filled 2021-11-01: qty 2

## 2021-11-01 NOTE — H&P (Signed)
History and Physical    AUDWIN SEMPER GEZ:[id number] DOB: 07/21/1940 DOA: 10/31/2021  DOS: the patient was seen and examined on 10/31/2021  PCP: Sharilyn Sites, MD   Patient coming from: Home  I have personally briefly reviewed patient's old medical records in [facility]. Frisina, 81 y/o with h/o diverticulosis and diverticular bleed in the past - 2019, also HTN, GERD, BPH. He was found at home by his daughter. He was by the commode and there was blood and blood clots around the commode. He was weak and seemed confused. EMS activate and patient transported to [facility] for evaluation    ED Course: Afebriel , BP stable. Patient in no distress. Lab: glucose 158, Alb 2.1, WBC 13.6, Hgb 7.6 (10.4 11/08/21), stool heme positive  Review of Systems:  Review of Systems  Constitutional: Negative.   HENT: Negative.    Eyes: Negative.   Respiratory:  Positive for shortness of breath.   Cardiovascular: Negative.   Gastrointestinal:  Positive for blood in stool.  Genitourinary: Negative.   Musculoskeletal: Negative.   Skin: Negative.   Neurological: Negative.   Endo/Heme/Allergies: Negative.   Psychiatric/Behavioral: Negative.      Past Medical History:  Diagnosis Date   Arthritis    Cancer [facility])    Prostate   COPD (chronic obstructive pulmonary disease) ([place])    Diverticulitis    Hypertension    Lower GI bleed 12/27/2017    Past Surgical History:  Procedure Laterality Date   CATARACT EXTRACTION W/PHACO Left 11/16/2012   Procedure: CATARACT EXTRACTION PHACO AND INTRAOCULAR LENS PLACEMENT ([place]);  Surgeon: Tonny Branch, MD;  Location: [facility];  Service: Ophthalmology;  Laterality: Left;  CDE: 13.66   CATARACT EXTRACTION W/PHACO Right 01/18/2013   Procedure: CATARACT EXTRACTION PHACO AND INTRAOCULAR LENS PLACEMENT ([facility]);  Surgeon: Tonny Branch, MD;  Location: [facility];  Service: Ophthalmology;  Laterality: Right;  CDE:8.79   CERVICAL FUSION  4 yrs ago   COLONOSCOPY N/A 12/28/2017    Procedure: COLONOSCOPY;  Surgeon: Rogene Houston, MD;  Location: [facility];  Service: Endoscopy;  Laterality: N/A;   COLONOSCOPY N/A 01/01/2018   Procedure: COLONOSCOPY;  Surgeon: Rogene Houston, MD;  Location[facility];  Service: Endoscopy;  Laterality: N/A;   ESOPHAGOGASTRODUODENOSCOPY N/A 01/01/2018   Procedure: ESOPHAGOGASTRODUODENOSCOPY (EGD);  Surgeon: Rogene Houston, MD;  [facility];  Service: Endoscopy;  Laterality: N/A;   INGUINAL HERNIA REPAIR Right 03/12/2021   Procedure: HERNIA REPAIR INGUINAL ADULT;  Surgeon: Aviva Signs, MD;  Location: [facility];  Service: General;  Laterality: Right;   JOINT REPLACEMENT Left 5 yrs ago   knee   PARTIAL COLECTOMY N/A 01/01/2018   Procedure: PARTIAL COLECTOMY;  Surgeon: Aviva Signs, MD;  Location: [facility];  Service: General;  Laterality: N/A;   right hip replacement Right 3 yrs ago   Soc Hx - widowed. Lives alone but daughter looks in every day. He has a son and daughter, several grands and several greats. Retired   reports that he has quit smoking. His smoking use included cigars. He has never used smokeless tobacco. He reports that he does not drink alcohol and does not use drugs.  No Known Allergies  Family History  Problem Relation Age of Onset   Diabetes Mother    Dementia Father     Prior to Admission medications   Medication Sig Start Date End Date Taking? Authorizing Provider  albuterol (PROVENTIL) (2.5 MG/3ML) 0.083% nebulizer solution Take 2.5 mg  by nebulization every 6 (six) hours as needed for wheezing or shortness of breath.    [provider]  albuterol (VENTOLIN HFA) 108 (90 Base) MCG/ACT inhaler Inhale 1-2 puffs into the lungs every 6 (six) hours as needed for wheezing or shortness of breath.    [provider]  cholecalciferol (VITAMIN D) 400 units TABS tablet Take 400 Units by mouth daily.     [provider]  LUMIGAN 0.01 % SOLN  08/31/19   [provider]   naproxen (NAPROSYN) 500 MG tablet Take 1 tablet (500 mg total) by mouth 2 (two) times daily with a meal. 10/13/21   Noemi Chapel, MD  Omega-3 Fatty Acids (FISH OIL) 1200 MG CAPS Take 1,200 mg by mouth daily.    [provider]  silodosin (RAPAFLO) 8 MG CAPS capsule Take 1 capsule (8 mg total) by mouth daily with breakfast. 12/28/20   Irine Seal, MD  [facility] HANDIHALER 18 MCG inhalation capsule  04/13/19   [provider]  traMADol (ULTRAM) 50 MG tablet Take 1 tablet (50 mg total) by mouth every 6 (six) hours as needed. 03/12/21   Aviva Signs, MD    Physical Exam: Vitals:   11/01/21 0130 11/01/21 0200 11/01/21 0330 11/01/21 0339  BP: (!) 86/57 97/62 113/61 113/61  Pulse: 95 91 85 85  Resp: (!) 22 19 (!) 25 (!) 25  Temp:  98.5 F (36.9 C)  98.2 F (36.8 C)  TempSrc:      SpO2: 99% 100% 99% 99%  Weight:      Height:        Physical Exam Vitals and nursing note reviewed.  Constitutional:      General: He is not in acute distress.    Appearance: He is normal weight. He is not ill-appearing.  HENT:     Head: Normocephalic and atraumatic.     Mouth/Throat:     Mouth: Mucous membranes are dry.     Comments: Edentulous w/o lesion Eyes:     Extraocular Movements: Extraocular movements intact.     Pupils: Pupils are equal, round, and reactive to light.     Comments: Arcus senilis  Cardiovascular:     Rate and Rhythm: Normal rate and regular rhythm.     Pulses: Normal pulses.     Heart sounds: Normal heart sounds.  Pulmonary:     Effort: Pulmonary effort is normal.     Breath sounds: Normal breath sounds.  Abdominal:     General: Bowel sounds are normal.     Palpations: Abdomen is soft. There is no mass.     Tenderness: There is no guarding or rebound.     Comments: Ventral surgical scar.   Musculoskeletal:        General: Normal range of motion.     Cervical back: Normal range of motion and neck supple.  Skin:    General: Skin is warm and dry.   Neurological:     General: No focal deficit present.     Mental Status: He is alert and oriented to person, place, and time. Mental status is at baseline.     Cranial Nerves: No cranial nerve deficit.  Psychiatric:        Mood and Affect: Mood normal.        Behavior: Behavior normal.      Labs on Admission: I have personally reviewed following labs and imaging studies  CBC: Recent Labs  Lab 10/31/21 2210  WBC 13.5*  NEUTROABS 11.2*  HGB 7.6*  HCT 23.8*  MCV 94.1  PLT 254*   Basic Metabolic Panel: Recent Labs  Lab 10/31/21 2210  NA 138  K 4.6  CL 107  CO2 24  GLUCOSE 138*  BUN 20  CREATININE 1.15  CALCIUM 8.2*   GFR: Estimated Creatinine Clearance: 50.7 mL/min (by C-G formula based on SCr of 1.15 mg/dL). Liver Function Tests: Recent Labs  Lab 10/31/21 2210  AST 20  ALT 26  ALKPHOS 80  BILITOT 1.1  PROT 6.5  ALBUMIN 2.1*   No results for input(s): "LIPASE", "AMYLASE" in the last 168 hours. No results for input(s): "AMMONIA" in the last 168 hours. Coagulation Profile: Recent Labs  Lab 10/31/21 2210  INR 1.2   Cardiac Enzymes: No results for input(s): "CKTOTAL", "CKMB", "CKMBINDEX", "TROPONINI" in the last 168 hours. BNP (last 3 results) No results for input(s): "PROBNP" in the last 8760 hours. HbA1C: No results for input(s): "HGBA1C" in the last 72 hours. CBG: No results for input(s): "GLUCAP" in the last 168 hours. Lipid Profile: No results for input(s): "CHOL", "HDL", "LDLCALC", "TRIG", "CHOLHDL", "LDLDIRECT" in the last 72 hours. Thyroid Function Tests: No results for input(s): "TSH", "T4TOTAL", "FREET4", "T3FREE", "THYROIDAB" in the last 72 hours. Anemia Panel: No results for input(s): "VITAMINB12", "FOLATE", "FERRITIN", "TIBC", "IRON", "RETICCTPCT" in the last 72 hours. Urine analysis:    Component Value Date/Time   COLORURINE YELLOW 05/09/2017 1705   APPEARANCEUR Clear 12/28/2020 1408   LABSPEC 1.017 05/09/2017 1705   PHURINE 5.0  05/09/2017 1705   GLUCOSEU Negative 12/28/2020 1408   HGBUR MODERATE (A) 05/09/2017 1705   BILIRUBINUR Negative 12/28/2020 1408   KETONESUR NEGATIVE 05/09/2017 1705   PROTEINUR Trace (A) 12/28/2020 1408   PROTEINUR NEGATIVE 05/09/2017 1705   UROBILINOGEN 0.2 09/10/2019 1535   UROBILINOGEN 0.2 09/13/2010 0850   NITRITE Negative 12/28/2020 1408   NITRITE NEGATIVE 05/09/2017 1705   LEUKOCYTESUR Negative 12/28/2020 1408    Radiological Exams on Admission: I have personally reviewed images No results found.  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 Now with sudden large volum LGI bleed w/o recurrence since initial event. Is receiving 2u PRBC  Plan  med-surg admit for minimum 3 days of observation for any recurrence  H/H TID (q8) starting 3 hrs after completion of transfusion  Essential hypertension BP stable.  Plan Continue home regimen  COPD (chronic obstructive pulmonary disease) (HCC) Stable - no increased WOB, no Wheezing.  Plan Continue home regimen       DVT prophylaxis:  TED hose Code Status: Full Code Family Communication: daughter present during interview and exam  Disposition Plan: home when stable  Consults called: none  Admission status: Inpatient, Med-Surg   Adella Hare, MD [facility] 11/01/2021, 4:25 AM

## 2021-11-01 NOTE — Subjective & Objective (Signed)
Randy Russo, 81 y/o with h/o diverticulosis and diverticular bleed in the past - 2019, also HTN, GERD, BPH. He was found at home by his daughter. He was by the commode and there was blood and blood clots around the commode. He was weak and seemed confused. EMS activate and patient transported to [facility] for evaluation

## 2021-11-01 NOTE — TOC Progression Note (Signed)
   Expected Discharge Plan: Home/Self Care Barriers to Discharge: Continued Medical Work up  Transition of Care [facility]) Screening Note   Patient Details  Name: Randy Russo Date of Birth: 14-Jan-1941   Transition of Care [facility]) CM/SW Contact:    Boneta Lucks, RN Phone Number: 11/01/2021, 1:52 PM    Transition of Care Department [facility]) has reviewed patient and no TOC needs have been identified at this time. We will continue to monitor patient advancement through interdisciplinary progression rounds. If new patient transition needs arise, please place a TOC consult.   Expected Discharge Plan and Services Expected Discharge Plan: Home/Self Care

## 2021-11-01 NOTE — Consult Note (Signed)
Gastroenterology Consult   Referring Provider: No ref. provider found Primary Care Physician:  Sharilyn Sites, MD Primary Gastroenterologist:  previously Dr. Laural Golden (now Dr. Jenetta Downer)  Patient ID: Randy Russo; [id number]; [birth year]   Admit date: 10/31/2021  LOS: 0 days   Date of Consultation: 11/01/2021  Reason for Consultation:  GI bleed    History of Present Illness   Randy Russo is a 81 y.o. male with history of massive diverticular bleeding status post right hemicolectomy in October 2019 (reported 10 unit colonic diverticular bleed), COPD, HTN, arthritis, recent falls who presented to the ED due to rectal bleeding, weakness.   Daughter reports she went to check on her father and found him in the bathroom with large amount of bloody stool with blood clots. Patient was weak and seemed a little confused. Per daughter, patient had been doing well at home previously.  He has a good appetite.  He has been trying to lose a little bit of weight, daughter states he had not been advised by his providers that it was needed.  She felt like he was a little dehydrated because of this.  Recently he has had some falls because of instability from knee issues.  He is tentatively scheduled for knee replacement in October if his shoulder is doing better before then.  Recently injured his shoulder from a fall, was placed on naproxen at that time (a couple weeks ago).  Patient denies any abdominal pain.  States his appetite has been good.  Generally his bowels are moving well without any blood per rectum.  Denies heartburn or indigestion.  No unintentional weight loss.  No dysphagia.  Denies any NSAID or aspirin use with the exception of recent naproxen that was started in the emergency department after a fall.  Patient's last bowel movement was nearly 24 hours ago.  In the ED: Blood pressure of 68/42, pulse 106.  BUN 20, creatinine 1.15, albumin 2.1, glucose 138, white blood cell count 13,500,  hemoglobin 7.6 (last available hemoglobin in 2020 was 9.6), hematocrit 23.8, MCV 94.1, platelets 417,000, INR 1.2.  He received 1 unit of packed red blood cells earlier this morning with post transfusion hemoglobin of 7.0.  He has been ordered for second unit of packed red blood cells pending.  Current pulse in the 80s, blood pressure 106/61.  Colonoscopy October 2019 - Blood in the entire examined colon. - Diverticulosis in the entire examined colon. - Active bleeding from diverticulum at hepatic flexure. Bleeding site not located. - Dilute epinephrine injected into the site without hemostasis. - No specimens collected.  EGD October 2019 - Normal esophagus. - Z-line irregular, 40 cm from the incisors. - Non-bleeding erosive gastropathy. - Normal duodenal bulb and second portion of the duodenum. - No specimens collected.    Prior to Admission medications   Medication Sig Start Date End Date Taking? Authorizing Provider  acetaminophen (TYLENOL) 500 MG tablet Take 1,000 mg by mouth every 6 (six) hours as needed for mild pain.   Yes [provider]  albuterol (PROVENTIL) (2.5 MG/3ML) 0.083% nebulizer solution Take 2.5 mg by nebulization every 6 (six) hours as needed for wheezing or shortness of breath.   Yes [provider]  albuterol (VENTOLIN HFA) 108 (90 Base) MCG/ACT inhaler Inhale 1-2 puffs into the lungs every 6 (six) hours as needed for wheezing or shortness of breath.   Yes [provider]  cholecalciferol (VITAMIN D) 400 units TABS tablet Take 400 Units by mouth daily.  Yes [provider]  naproxen (NAPROSYN) 500 MG tablet Take 1 tablet (500 mg total) by mouth 2 (two) times daily with a meal. 10/13/21  Yes Noemi Chapel, MD  Omega-3 Fatty Acids (FISH OIL) 1200 MG CAPS Take 1,200 mg by mouth daily.   Yes [provider]  SPIRIVA HANDIHALER 18 MCG inhalation capsule Place 18 mcg into inhaler and inhale daily. 04/13/19  Yes [provider]  silodosin (RAPAFLO) 8 MG CAPS capsule Take 1 capsule (8 mg total) by mouth daily with breakfast. Patient not taking: Reported on 11/01/2021 12/28/20   Irine Seal, MD  traMADol (ULTRAM) 50 MG tablet Take 1 tablet (50 mg total) by mouth every 6 (six) hours as needed. Patient not taking: Reported on 11/01/2021 03/12/21   Aviva Signs, MD    Current Facility-Administered Medications  Medication Dose Route Frequency Provider Last Rate Last Admin   0.9 %  sodium chloride infusion (Manually program via Guardrails IV Fluids)   Intravenous Once Georgette Shell, MD       0.9 %  sodium chloride infusion   Intravenous Continuous Norins, Heinz Knuckles, MD 75 mL/hr at 11/01/21 2993 Infusion Verify at 11/01/21 7169   acetaminophen (TYLENOL) tablet 650 mg  650 mg Oral Q6H PRN Norins, Heinz Knuckles, MD       Or   acetaminophen (TYLENOL) suppository 650 mg  650 mg Rectal Q6H PRN Norins, Heinz Knuckles, MD       albuterol (PROVENTIL) (2.5 MG/3ML) 0.083% nebulizer solution 3 mL  3 mL Inhalation Q6H PRN Norins, Heinz Knuckles, MD       naproxen (NAPROSYN) tablet 500 mg  500 mg Oral BID WC Norins, Heinz Knuckles, MD   500 mg at 11/01/21 0850   tamsulosin (FLOMAX) capsule 0.4 mg  0.4 mg Oral QPC breakfast Neena Rhymes, MD   0.4 mg at 11/01/21 0850   traMADol (ULTRAM) tablet 50 mg  50 mg Oral Q6H PRN Neena Rhymes, MD   50 mg at 11/01/21 0539   umeclidinium bromide (INCRUSE ELLIPTA) 62.5 MCG/ACT 1 puff  1 puff Inhalation Daily Norins, Heinz Knuckles, MD   1 puff at 11/01/21 0856    Allergies as of 10/31/2021   (No Known Allergies)    Past Medical History:  Diagnosis Date   Arthritis    Cancer [facility])    Prostate   COPD (chronic obstructive pulmonary disease) ([place])    Diverticulitis    Hypertension    Lower GI bleed 12/27/2017    Past Surgical History:  Procedure Laterality Date   CATARACT EXTRACTION W/PHACO Left 11/16/2012   Procedure: CATARACT EXTRACTION PHACO AND INTRAOCULAR LENS PLACEMENT ([place]);   Surgeon: Tonny Branch, MD;  Location: [facility];  Service: Ophthalmology;  Laterality: Left;  CDE: 13.66   CATARACT EXTRACTION W/PHACO Right 01/18/2013   Procedure: CATARACT EXTRACTION PHACO AND INTRAOCULAR LENS PLACEMENT ([facility]);  Surgeon: Tonny Branch, MD;  Location: [facility];  Service: Ophthalmology;  Laterality: Right;  CDE:8.79   CERVICAL FUSION  4 yrs ago   COLONOSCOPY N/A 12/28/2017   Procedure: COLONOSCOPY;  Surgeon: Rogene Houston, MD;  Location: [facility];  Service: Endoscopy;  Laterality: N/A;   COLONOSCOPY N/A 01/01/2018   Procedure: COLONOSCOPY;  Surgeon: Rogene Houston, MD;  Location: [facility];  Service: Endoscopy;  Laterality: N/A;   ESOPHAGOGASTRODUODENOSCOPY N/A 01/01/2018   Procedure: ESOPHAGOGASTRODUODENOSCOPY (EGD);  Surgeon: Rogene Houston, MD;  Location: [facility];  Service: Endoscopy;  Laterality: N/A;   INGUINAL  HERNIA REPAIR Right 03/12/2021   Procedure: HERNIA REPAIR INGUINAL ADULT;  Surgeon: Aviva Signs, MD;  Location: [facility];  Service: General;  Laterality: Right;   JOINT REPLACEMENT Left 5 yrs ago   knee   PARTIAL COLECTOMY N/A 01/01/2018   Procedure: PARTIAL COLECTOMY;  Surgeon: Aviva Signs, MD;  Location: [facility];  Service: General;  Laterality: N/A;   right hip replacement Right 3 yrs ago    Family History  Problem Relation Age of Onset   Diabetes Mother    Dementia Father     Social History   Socioeconomic History   Marital status: Married    Spouse name: Not on file   Number of children: Not on file   Years of education: Not on file   Highest education level: Not on file  Occupational History   Not on file  Tobacco Use   Smoking status: Former    Types: Cigars   Smokeless tobacco: Never  Vaping Use   Vaping Use: Never used  Substance and Sexual Activity   Alcohol use: No   Drug use: No   Sexual activity: Not Currently  Other Topics Concern   Not on file  Social History Narrative   Not on file   Social Determinants of Health    Financial Resource Strain: Unknown (12/27/2017)   Overall Financial Resource Strain (CARDIA)    Difficulty of Paying Living Expenses: Patient refused  Food Insecurity: Unknown (12/27/2017)   Hunger Vital Sign    Worried About Running Out of Food in the Last Year: Patient refused    [place] in the Last Year: Patient refused  Transportation Needs: Unknown (12/27/2017)   [place] - Hydrologist (Medical): Patient refused    Lack of Transportation (Non-Medical): Patient refused  Physical Activity: Unknown (12/27/2017)   Exercise Vital Sign    Days of Exercise per Week: Patient refused    Minutes of Exercise per Session: Patient refused  Stress: Not on file  Social Connections: Unknown (12/27/2017)   Social Connection and Isolation Panel [NHANES]    Frequency of Communication with Friends and Family: Patient refused    Frequency of Social Gatherings with Friends and Family: Patient refused    Attends Religious Services: Patient refused    Active Member of Clubs or Organizations: Patient refused    Attends Archivist Meetings: Patient refused    Marital Status: Patient refused  Intimate Partner Violence: Unknown (12/27/2017)   Humiliation, Afraid, Rape, and Kick questionnaire    Fear of Current or Ex-Partner: Patient refused    Emotionally Abused: Patient refused    Physically Abused: Patient refused    Sexually Abused: Patient refused     Review of System:   General: Negative for anorexia, unintentional weight loss, fever, chills, fatigue. +weakness. Eyes: Negative for vision changes.  ENT: Negative for hoarseness, difficulty swallowing , nasal congestion. CV: Negative for chest pain, angina, palpitations, dyspnea on exertion, peripheral edema.  Respiratory: Negative for dyspnea at rest, dyspnea on exertion, cough, sputum, wheezing.  GI: See history of present illness. GU:  Negative for dysuria, hematuria, urinary incontinence, urinary  frequency, nocturnal urination.  MS: Positive for joint pain, shoulder and knee.  No back pain. Derm: Negative for rash or itching.  Neuro: Negative for weakness, abnormal sensation, seizure, frequent headaches, memory loss, confusion.  Psych: Negative for anxiety, depression, suicidal ideation, hallucinations.  Endo: Negative for unusual weight change.  Heme: Negative for bruising or bleeding. Allergy:  Negative for rash or hives.      Physical Examination:   Vital signs in last 24 hours: Temp:  [98 F (36.7 C)-99.1 F (37.3 C)] 98 F (36.7 C) (08/10 0512) Pulse Rate:  [80-106] 80 (08/10 0512) Resp:  [18-31] 24 (08/10 0512) BP: (68-113)/(42-62) 106/61 (08/10 0512) SpO2:  [97 %-100 %] 99 % (08/10 0512) Weight:  [82 kg] 82 kg (08/09 2212) Last BM Date : 10/31/21  General: Well-nourished, well-developed in no acute distress.  Daughter at bedside. Head: Normocephalic, atraumatic.   Eyes: Conjunctiva pale, no icterus. Mouth: Oropharyngeal mucosa moist and pink , no lesions erythema or exudate. Neck: Supple without thyromegaly, masses, or lymphadenopathy.  Lungs: Clear to auscultation bilaterally.  Heart: Regular rate and rhythm, no murmurs rubs or gallops.  Abdomen: Bowel sounds are normal, nontender, nondistended, no hepatosplenomegaly or masses, no abdominal bruits or hernia , no rebound or guarding.   Rectal: Not performed Extremities: No lower extremity edema, clubbing, deformity.  Neuro: Alert and oriented x 4 , grossly normal neurologically.  Skin: Warm and dry, no rash or jaundice.   Psych: Alert and cooperative, normal mood and affect.        Intake/Output from previous day: 08/09 0701 - 08/10 0700 In: 790.7 [P.O.:60; I.V.:100.7; Blood:630] Out: -  Intake/Output this shift: No intake/output data recorded.  Lab Results:   CBC Recent Labs    10/31/21 2210 11/01/21 0828  WBC 13.5* 9.4  HGB 7.6* 7.0*  HCT 23.8* 21.3*  MCV 94.1 91.0  PLT 417* 299   BMET Recent  Labs    10/31/21 2210  NA 138  K 4.6  CL 107  CO2 24  GLUCOSE 138*  BUN 20  CREATININE 1.15  CALCIUM 8.2*   LFT Recent Labs    10/31/21 2210  BILITOT 1.1  ALKPHOS 80  AST 20  ALT 26  PROT 6.5  ALBUMIN 2.1*    Lipase No results for input(s): "LIPASE" in the last 72 hours.  PT/INR Recent Labs    10/31/21 2210  LABPROT 15.1  INR 1.2     Hepatitis Panel No results for input(s): "HEPBSAG", "HCVAB", "HEPAIGM", "HEPBIGM" in the last 72 hours.   Imaging Studies:   DG Shoulder Right  Result Date: 10/13/2021 CLINICAL DATA:  Fall. Fall onto right shoulder 2 times this past week. EXAM: RIGHT SHOULDER - 2+ VIEW COMPARISON:  None Available. FINDINGS: Moderate glenohumeral joint space narrowing and peripheral osteophytosis. Moderate to severe acromioclavicular joint space narrowing and peripheral osteophytosis. Mild distal lateral subacromial spurring. No acute fracture is seen. No dislocation. Moderate multilevel degenerative disc and endplate changes of the thoracic spine. IMPRESSION: 1. No acute fracture. 2. 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 Electronically Signed   By: Yvonne Kendall M.D.   On: 10/13/2021 16:48  [4 week]  Assessment:   GI bleed: History of massive diverticular bleed in 2019 requiring right hemicolectomy.  Subsequent hospitalization for suspected diverticular bleed in August 2020.  Since that time he has done well.  Presents with painless large-volume GI bleed, suspected lower GI bleed.  Recent baseline hemoglobin not available, hemoglobin was 9.6 in August 2020.  In the ED his hemoglobin was 7.6, down to 7.0 after 1 unit of blood.  1 unit of blood pending.  Last bowel movement at this point has been about 24 hours ago according to his daughter.  Suspect recurrent diverticular bleed.  Less likely rapid transit upper GI bleed.  Bleeding may have stopped given no bowel  movement in 24 hours.  Will not rule out need for  updating colonoscopy especially if hemoglobin does not respond appropriately to blood transfusion and/or further bleeding noted.  Plan:   Monitor for further bleeding or decline in hemoglobin. Will allow clear liquids for now in case colonoscopy needed. Consider PPI for GI protection if ongoing NSAIDs needed.   LOS: 0 days   We would like to thank you for the opportunity to participate in the care of Randy Russo.  Laureen Ochs. Bernarda Caffey [facility] [phone] 8/10/202311:03 AM

## 2021-11-01 NOTE — Plan of Care (Signed)
81 year old male admitted 11/01/2021 with GI bleed likely lower.  Patient received 1 unit of blood transfusion without improvement in his hemoglobin.  All other units of blood transfusion ordered.  His daughter was at the bedside when I saw the patient.  She reported he has not had any more bowel movements since yesterday.  Patient lives alone and he is scheduled to have a knee surgery in the next month or so.  Will DC naproxen and continue Tylenol and add PPI follow-up H&H later today and tomorrow.  Appreciate GI input.

## 2021-11-01 NOTE — Assessment & Plan Note (Signed)
Patient with known diverticulosis and previous h/o diverticular bleed proximal large intestine that came to colectomy. Now with sudden large volum LGI bleed w/o recurrence since initial event. Is receiving 2u PRBC  Plan  med-surg admit for minimum 3 days of observation for any recurrence  H/H TID (q8) starting 3 hrs after completion of transfusion

## 2021-11-01 NOTE — Assessment & Plan Note (Signed)
Stable - no increased WOB, no Wheezing.  Plan Continue home regimen

## 2021-11-01 NOTE — Assessment & Plan Note (Signed)
BP stable.  Plan Continue home regimen 

## 2021-11-02 DIAGNOSIS — K5731 Diverticulosis of large intestine without perforation or abscess with bleeding: Principal | ICD-10-CM

## 2021-11-02 DIAGNOSIS — K922 Gastrointestinal hemorrhage, unspecified: Secondary | ICD-10-CM | POA: Diagnosis not present

## 2021-11-02 LAB — TYPE AND SCREEN
ABO/RH(D): B POS
Antibody Screen: NEGATIVE
Unit division: 0
Unit division: 0
Unit division: 0

## 2021-11-02 LAB — COMPREHENSIVE METABOLIC PANEL
ALT: 17 U/L (ref 0–44)
AST: 11 U/L — ABNORMAL LOW (ref 15–41)
Albumin: 1.8 g/dL — ABNORMAL LOW (ref 3.5–5.0)
Alkaline Phosphatase: 59 U/L (ref 38–126)
Anion gap: 5 (ref 5–15)
BUN: 17 mg/dL (ref 8–23)
CO2: 24 mmol/L (ref 22–32)
Calcium: 7.6 mg/dL — ABNORMAL LOW (ref 8.9–10.3)
Chloride: 109 mmol/L (ref 98–111)
Creatinine, Ser: 0.77 mg/dL (ref 0.61–1.24)
GFR, Estimated: >60 mL/min (ref >60)
Glucose, Bld: 84 mg/dL (ref 70–99)
Potassium: 4.1 mmol/L (ref 3.5–5.1)
Sodium: 138 mmol/L (ref 135–145)
Total Bilirubin: 0.9 mg/dL (ref 0.3–1.2)
Total Protein: 5.4 g/dL — ABNORMAL LOW (ref 6.5–8.1)

## 2021-11-02 LAB — BPAM RBC
Blood Product Expiration Date: 202308162359
Blood Product Expiration Date: 202309052359
Blood Product Expiration Date: 202309112359
ISSUE DATE / TIME: 202308100040
ISSUE DATE / TIME: 202308101241
Unit Type and Rh: 1700
Unit Type and Rh: 1700
Unit Type and Rh: 7300

## 2021-11-02 LAB — CBC
HCT: 24.7 % — ABNORMAL LOW (ref 39.0–52.0)
Hemoglobin: 8.1 g/dL — ABNORMAL LOW (ref 13.0–17.0)
MCH: 30 pg (ref 26.0–34.0)
MCHC: 32.8 g/dL (ref 30.0–36.0)
MCV: 91.5 fL (ref 80.0–100.0)
Platelets: 304 10*3/uL (ref 150–400)
RBC: 2.7 MIL/uL — ABNORMAL LOW (ref 4.22–5.81)
RDW: 14.9 % (ref 11.5–15.5)
WBC: 7.1 10*3/uL (ref 4.0–10.5)
nRBC: 0 % (ref 0.0–0.2)

## 2021-11-02 LAB — HEMOGLOBIN AND HEMATOCRIT, BLOOD
HCT: 25.1 % — ABNORMAL LOW (ref 39.0–52.0)
HCT: 28.4 % — ABNORMAL LOW (ref 39.0–52.0)
HCT: 27.1 % — ABNORMAL LOW (ref 39.0–52.0)
HCT: 24.5 % — ABNORMAL LOW (ref 39.0–52.0)
Hemoglobin: 8.4 g/dL — ABNORMAL LOW (ref 13.0–17.0)
Hemoglobin: 9.2 g/dL — ABNORMAL LOW (ref 13.0–17.0)
Hemoglobin: 8.7 g/dL — ABNORMAL LOW (ref 13.0–17.0)
Hemoglobin: 8.2 g/dL — ABNORMAL LOW (ref 13.0–17.0)

## 2021-11-02 NOTE — Plan of Care (Signed)
  Problem: Acute Rehab PT Goals(only PT should resolve) Goal: Patient Will Transfer Sit To/From Stand Outcome: Progressing Flowsheets (Taken 11/02/2021 1334) Patient will transfer sit to/from stand:  with modified independence  with supervision Goal: Pt Will Transfer Bed To Chair/Chair To Bed Outcome: Progressing Flowsheets (Taken 11/02/2021 1334) Pt will Transfer Bed to Chair/Chair to Bed:  with modified independence  with supervision Goal: Pt Will Ambulate Outcome: Progressing Flowsheets (Taken 11/02/2021 1334) Pt will Ambulate:  50 feet  with modified independence  with supervision  with rolling walker Goal: Pt/caregiver will Perform Home Exercise Program Outcome: Progressing Flowsheets (Taken 11/02/2021 1334) Pt/caregiver will Perform Home Exercise Program:  For increased strengthening  For improved balance  Independently  1:35 PM, 11/02/21 Mearl Latin PT, DPT Physical Therapist at [facility]

## 2021-11-02 NOTE — Progress Notes (Signed)
Pt cooperative and pleasant. Has not had a bm during this writers shift, malewick remains in place. IV fluids running at 75 ml/hr, no s/s of infiltration at IV site. Resting in bed with no c/o pain or discomfort.

## 2021-11-02 NOTE — Progress Notes (Signed)
PROGRESS NOTE    Randy Russo  DZH:[id number] DOB: 1940/10/26 DOA: 10/31/2021 PCP: Sharilyn Sites, MD    Brief Narrative: 81 year old male lives alone admitted with GI bleed.  Patient had bloody stool and blood clots prior to admission to the hospital. Patient had history of massive diverticular bleeding in 2019 which resulted in a right hemicolectomy.  His past medical history significant for COPD hypertension arthritis and recurrent falls.  Daughter reports that he is scheduled for a knee replacement with Dr. Alvan Dame in October.  Patient has been having multiple falls at home.  He was started on naproxen for shoulder pain recently. He was very hypotensive on admission with a blood pressure of 68/42, tachycardic, hemoglobin 7.6 on admission.  Platelet counts were normal INR was 1.2.  Patient received 2 units of blood transfusion during his hospital stay.  Assessment & Plan:   Principal Problem:   Acute lower GI bleeding Active Problems:   COPD (chronic obstructive pulmonary disease) ([facility])   Essential hypertension   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   Pressure injury of skin  #1 lower GI bleed possible diverticular bleed  he denies having any bloody bowel movements overnight. His hemoglobin on admission was 6.9 up to 8.4 after 2 units of blood transfusion. DC'd NSAID Started PPI just in case if he has any upper GI bleed Blood pressure remains soft 104/61 on NS 75 cc an hour. PT evaluation Appreciate GI input Patient is on clear liquids  #2 history of COPD stable continue home meds  #3 BPH on Flomax continue  #4 goals of care patient is full code.   Pressure Injury 11/01/21 Rectum Right;Lower Stage 2 -  Partial thickness loss of dermis presenting as a shallow open injury with a red, pink wound bed without slough. (Active)  11/01/21 0538  Location: Rectum  Location Orientation: Right;Lower  Staging: Stage 2 -  Partial thickness loss of dermis presenting  as a shallow open injury with a red, pink wound bed without slough.  Wound Description (Comments):   Present on Admission: Yes  Dressing Type Foam - Lift dressing to assess site every shift 11/01/21 2321    Estimated body mass index is 29.18 kg/m as calculated from the following:   Height as of this encounter: '5\' 6"'$  (1.676 m).   Weight as of this encounter: 82 kg.  DVT prophylaxis: None due to GI bleed code Status: Full code  family Communication: None at bedside today discussed with daughter yesterday Disposition Plan:  Status is: Inpatient Remains inpatient appropriate because: GI bleed   Consultants:  Gi  Procedures: None none Antimicrobials: None  Subjective:  He is resting in bed denies having a bowel movement overnight he is on clear liquids denies nausea vomiting or abdominal pain  Objective: Vitals:   11/01/21 1700 11/01/21 2016 11/02/21 0436 11/02/21 0759  BP: (!) 99/54 (!) 106/57 104/61   Pulse: 84 82 71   Resp: '18 18 18   '$ Temp: 98.6 F (37 C) 98.3 F (36.8 C) 98.8 F (37.1 C)   TempSrc:      SpO2: 100% 99% 98% 99%  Weight:      Height:        Intake/Output Summary (Last 24 hours) at 11/02/2021 1215 Last data filed at 11/02/2021 0700 Gross per 24 hour  Intake 1488.46 ml  Output 2700 ml  Net -1211.54 ml   Filed Weights   10/31/21 2212  Weight: 82 kg    Examination:  General  exam: Appears calm and comfortable  Respiratory system: Clear to auscultation. Respiratory effort normal. Cardiovascular system: S1 & S2 heard, RRR. No JVD, murmurs, rubs, gallops or clicks. No pedal edema. Gastrointestinal system: Abdomen is nondistended, soft and nontender. No organomegaly or masses felt. Normal bowel sounds heard. Central nervous system: Alert and oriented. No focal neurological deficits. Extremities: Symmetric 5 x 5 power. Skin: No rashes, lesions or ulcers Psychiatry: Judgement and insight appear normal. Mood & affect appropriate.     Data Reviewed: I  have personally reviewed following labs and imaging studies  CBC: Recent Labs  Lab 10/31/21 2210 11/01/21 0828 11/01/21 1238 11/01/21 1845 11/02/21 0432 11/02/21 0751  WBC 13.5* 9.4  --   --  7.1  --   NEUTROABS 11.2*  --   --   --   --   --   HGB 7.6* 7.0* 6.9* 7.6* 8.1* 8.4*  HCT 23.8* 21.3* 21.3* 23.0* 24.7* 25.1*  MCV 94.1 91.0  --   --  91.5  --   PLT 417* 299  --   --  304  --    Basic Metabolic Panel: Recent Labs  Lab 10/31/21 2210 11/02/21 0432  NA 138 138  K 4.6 4.1  CL 107 109  CO2 24 24  GLUCOSE 138* 84  BUN 20 17  CREATININE 1.15 0.77  CALCIUM 8.2* 7.6*   GFR: Estimated Creatinine Clearance: 72.8 mL/min (by C-G formula based on SCr of 0.77 mg/dL). Liver Function Tests: Recent Labs  Lab 10/31/21 2210 11/02/21 0432  AST 20 11*  ALT 26 17  ALKPHOS 80 59  BILITOT 1.1 0.9  PROT 6.5 5.4*  ALBUMIN 2.1* 1.8*   No results for input(s): "LIPASE", "AMYLASE" in the last 168 hours. No results for input(s): "AMMONIA" in the last 168 hours. Coagulation Profile: Recent Labs  Lab 10/31/21 2210  INR 1.2   Cardiac Enzymes: No results for input(s): "CKTOTAL", "CKMB", "CKMBINDEX", "TROPONINI" in the last 168 hours. BNP (last 3 results) No results for input(s): "PROBNP" in the last 8760 hours. HbA1C: No results for input(s): "HGBA1C" in the last 72 hours. CBG: No results for input(s): "GLUCAP" in the last 168 hours. Lipid Profile: No results for input(s): "CHOL", "HDL", "LDLCALC", "TRIG", "CHOLHDL", "LDLDIRECT" in the last 72 hours. Thyroid Function Tests: No results for input(s): "TSH", "T4TOTAL", "FREET4", "T3FREE", "THYROIDAB" in the last 72 hours. Anemia Panel: No results for input(s): "VITAMINB12", "FOLATE", "FERRITIN", "TIBC", "IRON", "RETICCTPCT" in the last 72 hours. Sepsis Labs: No results for input(s): "PROCALCITON", "LATICACIDVEN" in the last 168 hours.  No results found for this or any previous visit (from the past 240 hour(s)).   Radiology  Studies: No results found.   Scheduled Meds:  pantoprazole  40 mg Oral Daily   tamsulosin  0.4 mg Oral QPC breakfast   umeclidinium bromide  1 puff Inhalation Daily   Continuous Infusions:  sodium chloride 75 mL/hr at 11/01/21 0632     LOS: 1 day    Time spent: 35 minutes  Georgette Shell, MD 11/02/2021, 12:15 PM

## 2021-11-02 NOTE — Evaluation (Signed)
Physical Therapy Evaluation Patient Details Name: Randy Russo MRN: [id number] DOB: 01-Jun-1940 Today's Date: 11/02/2021  History of Present Illness  Randy Russo, 81 y/o with h/o diverticulosis and diverticular bleed in the past - 2019, also HTN, GERD, BPH. He was found at home by his daughter. He was by the commode and there was blood and blood clots around the commode. He was weak and seemed confused. EMS activate and patient transported to [facility] for evaluation   Clinical Impression  Patient limited for functional mobility as stated below secondary to BLE weakness, fatigue and impaired standing balance. Patient seated EOB at beginning of session. He requires min g/a to transfer to standing along with RW. He is min/mod unsteady with standing/ambualting but without loss of balance. He stands and ambulates in a crouched position with trunk flexed and ambulates with slow cadence. Patient returns to room and is set up in chair at end of session.  Patient will benefit from continued physical therapy in hospital and recommended venue below to increase strength, balance, endurance for safe ADLs and gait.        Recommendations for follow up therapy are one component of a multi-disciplinary discharge planning process, led by the attending physician.  Recommendations may be updated based on patient status, additional functional criteria and insurance authorization.  Follow Up Recommendations Home health PT      Assistance Recommended at Discharge Intermittent Supervision/Assistance  Patient can return home with the following  A little help with walking and/or transfers;A little help with bathing/dressing/bathroom;Assistance with cooking/housework    Equipment Recommendations None recommended by PT  Recommendations for Other Services       Functional Status Assessment Patient has had a recent decline in their functional status and demonstrates the ability to make significant improvements in  function in a reasonable and predictable amount of time.     Precautions / Restrictions Precautions Precautions: Fall Restrictions Weight Bearing Restrictions: No      Mobility  Bed Mobility                    Transfers Overall transfer level: Needs assistance Equipment used: Rolling walker (2 wheels) Transfers: Sit to/from Stand Sit to Stand: Min assist, Min guard           General transfer comment: labored transfer to standin gwith RW    Ambulation/Gait Ambulation/Gait assistance: Min guard, Min assist Gait Distance (Feet): 40 Feet Assistive device: Rolling walker (2 wheels) Gait Pattern/deviations: Step-through pattern, Decreased stride length, Trunk flexed Gait velocity: decreased     General Gait Details: crouched gait with poor R foot clearance  Stairs            Wheelchair Mobility    Modified Rankin (Stroke Patients Only)       Balance Overall balance assessment: Needs assistance Sitting-balance support: No upper extremity supported, Feet supported Sitting balance-Leahy Scale: Good Sitting balance - Comments: seated EOB   Standing balance support: Bilateral upper extremity supported Standing balance-Leahy Scale: Fair Standing balance comment: fair with RW                             Pertinent Vitals/Pain Pain Assessment Pain Assessment: No/denies pain    Home Living Family/patient expects to be discharged to:: Private residence Living Arrangements: Alone Available Help at Discharge: Family;Available PRN/intermittently Type of Home: House Home Access: Stairs to enter Entrance Stairs-Rails: None Entrance Stairs-Number of Steps: 1   Home Layout:  One level Home Equipment: Conservation officer, nature (2 wheels);BSC/3in1      Prior Function Prior Level of Function : Independent/Modified Independent             Mobility Comments: household ambulation with RW ADLs Comments: states independent with basic ADL     Hand  Dominance        Extremity/Trunk Assessment   Upper Extremity Assessment Upper Extremity Assessment: Generalized weakness    Lower Extremity Assessment Lower Extremity Assessment: Generalized weakness    Cervical / Trunk Assessment Cervical / Trunk Assessment: Kyphotic  Communication   Communication: No difficulties  Cognition Arousal/Alertness: Awake/alert Behavior During Therapy: WFL for tasks assessed/performed Overall Cognitive Status: Within Functional Limits for tasks assessed                                          General Comments      Exercises     Assessment/Plan    PT Assessment Patient needs continued PT services  PT Problem List Decreased strength;Decreased mobility;Decreased range of motion;Decreased activity tolerance;Decreased balance;Decreased knowledge of use of DME       PT Treatment Interventions DME instruction;Therapeutic exercise;Gait training;Balance training;Stair training;Neuromuscular re-education;Functional mobility training;Therapeutic activities;Patient/family education    PT Goals (Current goals can be found in the Care Plan section)  Acute Rehab PT Goals Patient Stated Goal: Return home PT Goal Formulation: With patient Time For Goal Achievement: 11/16/21 Potential to Achieve Goals: Good    Frequency Min 3X/week     Co-evaluation               AM-PAC PT "6 Clicks" Mobility  Outcome Measure Help needed turning from your back to your side while in a flat bed without using bedrails?: A Little Help needed moving from lying on your back to sitting on the side of a flat bed without using bedrails?: A Little Help needed moving to and from a bed to a chair (including a wheelchair)?: A Little Help needed standing up from a chair using your arms (e.g., wheelchair or bedside chair)?: A Little Help needed to walk in hospital room?: A Little Help needed climbing 3-5 steps with a railing? : A Lot 6 Click Score:  17    End of Session Equipment Utilized During Treatment: Gait belt Activity Tolerance: Patient tolerated treatment well;Patient limited by fatigue Patient left: in chair;with call bell/phone within reach Nurse Communication: Mobility status PT Visit Diagnosis: Unsteadiness on feet (R26.81);Other abnormalities of gait and mobility (R26.89);Muscle weakness (generalized) (M62.81);History of falling (Z91.81)    Time: 1205-1223 PT Time Calculation (min) (ACUTE ONLY): 18 min   Charges:   PT Evaluation $PT Eval Low Complexity: 1 Low PT Treatments $Therapeutic Activity: 8-22 mins       1:33 PM, 11/02/21 Mearl Latin PT, DPT Physical Therapist at [facility]

## 2021-11-02 NOTE — Progress Notes (Signed)
Gastroenterology Progress Note   Referring Provider: No ref. provider found Primary Care Physician:  Sharilyn Sites, MD Primary Gastroenterologist:  Dr. Jenetta Downer (previously [facility])  Patient ID: Randy Russo; [id number]; 09/19/1940    Subjective   Patient reports feeling well today.  He continues to deny any abdominal pain, nausea, vomiting, dysphagia, fevers, or lack of appetite.  Patient does admit to me of some recent falls given his knee pain.  He denies any reflux symptoms.  Tolerating liquid diet without difficulty.   Objective   Vital signs in last 24 hours Temp:  [97.6 F (36.4 C)-98.8 F (37.1 C)] 98.8 F (37.1 C) (08/11 0436) Pulse Rate:  [71-85] 71 (08/11 0436) Resp:  [17-18] 18 (08/11 0436) BP: (96-106)/(51-61) 104/61 (08/11 0436) SpO2:  [98 %-100 %] 99 % (08/11 0759) Last BM Date : 11/02/21  Physical Exam General:   Alert and oriented, pleasant Head:  Normocephalic and atraumatic. Eyes:  No icterus, sclera clear. Conjuctiva pink.  Heart:  S1, S2 present, no murmurs noted.  Lungs: Clear to auscultation bilaterally, without wheezing, rales, or rhonchi.  Abdomen:  Bowel sounds present, soft, non-tender, non-distended. No HSM or hernias noted. No rebound or guarding. No masses appreciated  Msk:  Symmetrical without gross deformities. Normal posture. Extremities:  Without clubbing or edema. Neurologic:  Alert and  oriented x4;  grossly normal neurologically. Skin:  Warm and dry, intact without significant lesions.  Psych:  Alert and cooperative. Normal mood and affect.  Intake/Output from previous day: 08/10 0701 - 08/11 0700 In: 1488.5 [P.O.:600; I.V.:456; Blood:432.5] Out: 2700 [Urine:2700] Intake/Output this shift: No intake/output data recorded.  Lab Results  Recent Labs    10/31/21 2210 11/01/21 0828 11/01/21 1238 11/01/21 1845 11/02/21 0432 11/02/21 0751  WBC 13.5* 9.4  --   --  7.1  --   HGB 7.6* 7.0*   < > 7.6* 8.1* 8.4*  HCT 23.8* 21.3*    < > 23.0* 24.7* 25.1*  PLT 417* 299  --   --  304  --    < > = values in this interval not displayed.   BMET Recent Labs    10/31/21 2210 11/02/21 0432  NA 138 138  K 4.6 4.1  CL 107 109  CO2 24 24  GLUCOSE 138* 84  BUN 20 17  CREATININE 1.15 0.77  CALCIUM 8.2* 7.6*   LFT Recent Labs    10/31/21 2210 11/02/21 0432  PROT 6.5 5.4*  ALBUMIN 2.1* 1.8*  AST 20 11*  ALT 26 17  ALKPHOS 80 59  BILITOT 1.1 0.9   PT/INR Recent Labs    10/31/21 2210  LABPROT 15.1  INR 1.2   Hepatitis Panel No results for input(s): "HEPBSAG", "HCVAB", "HEPAIGM", "HEPBIGM" in the last 72 hours.  Studies/Results DG Shoulder Right  Result Date: 10/13/2021 CLINICAL DATA:  Fall. Fall onto right shoulder 2 times this past week. EXAM: RIGHT SHOULDER - 2+ VIEW COMPARISON:  None Available. FINDINGS: Moderate glenohumeral joint space narrowing and peripheral osteophytosis. Moderate to severe acromioclavicular joint space narrowing and peripheral osteophytosis. Mild distal lateral subacromial spurring. No acute fracture is seen. No dislocation. Moderate multilevel degenerative disc and endplate changes of the thoracic spine. IMPRESSION: 1. No acute fracture. 2. 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 Electronically Signed   By: Yvonne Kendall M.D.   On: 10/13/2021 16:48    Assessment  81 y.o. male with a history of massive diverticular bleed s/p right hemicolectomy in October 2019 (reported 10  unit colonic diverticular bleed), COPD, HTN, arthritis, recent falls who presented to the ED due to rectal bleeding and weakness.  GI bleed: History of massive diverticular bleed in 2019 requiring right hemicolectomy.  Subsequent hospitalization for suspected diverticular bleed in August 2020.  Since that time he has done well and currently presenting with painless large-volume GI bleed, suspected lower GI bleed.  Unable to determine recent baseline hemoglobin.  Last hemoglobin  9.6 in August 2020.  Hemoglobin in the ED found to be 7.6 and subsequently 1:07 unit PRBCs.  He received an additional unit of blood and this morning his hemoglobin is 8.4.  Per patient and nursing he has not had a bowel movement overnight and yesterday when he was seen for consult had been 24 hours since last bowel movement.  Given current resolution, suspect self-limiting recurrent diverticular bleed.  Burtis Junes advancing his diet today and monitoring H/H, if no further bleeding occurs we will hold off on colonoscopy this admission.   Plan / Recommendations  Continue PPI Continue to monitor for further bleeding Monitor H/H, transfuse for hemoglobin less than 7 Advance to soft diet    LOS: 1 day    11/02/2021, 12:32 PM   Venetia Night, MSN, FNP-BC, AGACNP-BC [facility]

## 2021-11-02 NOTE — TOC Progression Note (Signed)
Transition of Care [facility]) - Progression Note    Patient Details  Name: Randy Russo MRN: [id number] Date of Birth: 15-Dec-1940  Transition of Care [facility]) CM/SW [place], [place] Phone Number: 11/02/2021, 2:24 PM  Clinical Narrative:    CSW updated that PT is recommending [facility] for pt at D/C. CSW spoke with pt and family about this recommendation. Pt states he would like to come to therapy and not have [place]. CSW to make outpatient PT referral. TOC to follow.   Expected Discharge Plan: Home/Self Care Barriers to Discharge: Continued Medical Work up  Expected Discharge Plan and Services Expected Discharge Plan: Home/Self Care                                               Social Determinants of Health (SDOH) Interventions    Readmission Risk Interventions     No data to display

## 2021-11-02 NOTE — Care Management Important Message (Signed)
Important Message  Patient Details  Name: Randy Russo MRN: [id number] Date of Birth: [birth year]   Medicare Important Message Given:  Yes     Tommy Medal 11/02/2021, 11:31 AM

## 2021-11-03 ENCOUNTER — Telehealth: Payer: Self-pay | Admitting: Internal Medicine

## 2021-11-03 DIAGNOSIS — K922 Gastrointestinal hemorrhage, unspecified: Secondary | ICD-10-CM | POA: Diagnosis not present

## 2021-11-03 LAB — HEMOGLOBIN AND HEMATOCRIT, BLOOD
HCT: 25.6 % — ABNORMAL LOW (ref 39.0–52.0)
HCT: 24.5 % — ABNORMAL LOW (ref 39.0–52.0)
Hemoglobin: 8.4 g/dL — ABNORMAL LOW (ref 13.0–17.0)
Hemoglobin: 7.9 g/dL — ABNORMAL LOW (ref 13.0–17.0)

## 2021-11-03 LAB — CBC
HCT: 24.3 % — ABNORMAL LOW (ref 39.0–52.0)
Hemoglobin: 7.9 g/dL — ABNORMAL LOW (ref 13.0–17.0)
MCH: 29.9 pg (ref 26.0–34.0)
MCHC: 32.5 g/dL (ref 30.0–36.0)
MCV: 92 fL (ref 80.0–100.0)
Platelets: 336 10*3/uL (ref 150–400)
RBC: 2.64 MIL/uL — ABNORMAL LOW (ref 4.22–5.81)
RDW: 14.5 % (ref 11.5–15.5)
WBC: 7.3 10*3/uL (ref 4.0–10.5)
nRBC: 0 % (ref 0.0–0.2)

## 2021-11-03 NOTE — Discharge Summary (Signed)
Physician Discharge Summary  Randy Russo OEV:[id number] DOB: 10-25-1940 DOA: 10/31/2021  PCP: Sharilyn Sites, MD  Admit date: 10/31/2021 Discharge date: 11/03/2021  Admitted From: Home Disposition: Home Home  Recommendations for Outpatient Follow-up:  Follow up with PCP in 1-2 weeks Please obtain BMP/CBC in one week Please follow up with GI Dr. Jenetta Downer Home Health: Yes as Equipment/Devices: None Discharge Condition: Stable  CODE STATUS: Full code Diet recommendation: Cardiac Brief/Interim Summary:81 year old male lives alone admitted with GI bleed.  Patient had bloody stool and blood clots prior to admission to the hospital. Patient had history of massive diverticular bleeding in 2019 which resulted in a right hemicolectomy.  His past medical history significant for COPD hypertension arthritis and recurrent falls.  Daughter reports that he is scheduled for a knee replacement with Dr. Alvan Dame in October.  Patient has been having multiple falls at home.  He was started on naproxen for shoulder pain recently. He was very hypotensive on admission with a blood pressure of 68/42, tachycardic, hemoglobin 7.6 on admission.  Platelet counts were normal INR was 1.2.  Patient received 2 units of blood transfusion during his hospital stay.  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  He was taking naproxen which was stopped.  He was treated with IV fluids and blood transfusion with improvement.  Patient tolerated a diet prior to discharge.  He was seen by GI during the hospital stay.  Since his hemoglobin improved with no active bleeding patient did not have any  procedures during this stay.  he will follow-up with GI on discharge.   #2 history of COPD stable continue home meds   #3 BPH on Flomax continue   #4 goals of care patient is full code Pressure Injury 11/01/21 Rectum Right;Lower Stage 2 -  Partial thickness loss of dermis presenting as a shallow open injury with a red, pink wound bed without slough. (Active)  11/01/21 0538  Location: Rectum  Location Orientation: Right;Lower  Staging: Stage 2 -  Partial thickness loss of dermis presenting as a shallow open injury with a red, pink wound bed without slough.  Wound Description (Comments):   Present on Admission: Yes  Dressing Type Foam - Lift dressing to assess site every shift 11/01/21 2321    Estimated body mass index is 29.18 kg/m as calculated from the following:   Height as of this encounter: '5\' 6"'$  (1.676 m).   Weight as of this encounter: 82 kg.  Discharge Instructions  Discharge Instructions     Ambulatory referral to Physical Therapy   Complete by: As directed    Diet - low sodium heart healthy   Complete by: As directed    Diet - low sodium heart healthy   Complete by: As directed    Increase activity slowly   Complete by: As directed    Increase activity slowly   Complete by: As directed    No wound care   Complete by: As directed    No wound care   Complete by: As directed       Allergies as of 11/03/2021   No Known Allergies      Medication List  STOP taking these medications    naproxen 500 MG tablet Commonly known as: Naprosyn   silodosin 8 MG Caps capsule Commonly known as: RAPAFLO   traMADol 50 MG tablet Commonly known as: Ultram       TAKE these medications    acetaminophen 500 MG tablet Commonly known as: TYLENOL Take 1,000 mg by mouth every 6 (six) hours as needed for mild pain.   albuterol 108 (90 Base) MCG/ACT inhaler Commonly known as: VENTOLIN HFA Inhale 1-2 puffs into the lungs every 6 (six) hours as needed for wheezing  or shortness of breath.   albuterol (2.5 MG/3ML) 0.083% nebulizer solution Commonly known as: PROVENTIL Take 2.5 mg by nebulization every 6 (six) hours as needed for wheezing or shortness of breath.   cholecalciferol 10 MCG (400 UNIT) Tabs tablet Commonly known as: VITAMIN D3 Take 400 Units by mouth daily.   Fish Oil 1200 MG Caps Take 1,200 mg by mouth daily.   Spiriva HandiHaler 18 MCG inhalation capsule Generic drug: tiotropium Place 18 mcg into inhaler and inhale daily.        Follow-up Information     [facility] Follow up.   Specialty: Rehabilitation Why: They will call to set up an appointment. Contact information: [address] [id number] Prudy Feeler [place]        Sharilyn Sites, MD Follow up.   Specialty: Family Medicine Contact information: [address] [place] [address] [phone]         Harvel Quale, MD Follow up.   Specialty: Gastroenterology Contact information: [address] [place] [address] [phone]                No Known Allergies  Consultations: GI   Procedures/Studies: DG Shoulder Right  Result Date: 10/13/2021 CLINICAL DATA:  Fall. Fall onto right shoulder 2 times this past week. EXAM: RIGHT SHOULDER - 2+ VIEW COMPARISON:  None Available. FINDINGS: Moderate glenohumeral joint space narrowing and peripheral osteophytosis. Moderate to severe acromioclavicular joint space narrowing and peripheral osteophytosis. Mild distal lateral subacromial spurring. No acute fracture is seen. No dislocation. Moderate multilevel degenerative disc and endplate changes of the thoracic spine. IMPRESSION: 1. No acute fracture. 2. 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 Electronically Signed   By: Yvonne Kendall M.D.   On: 10/13/2021 16:48   (Echo, Carotid, EGD, Colonoscopy, ERCP)     Subjective:  Patient seen sitting up in chair in no acute distress he denied any new complaints no nausea vomiting hematemesis melena Discharge Exam: Vitals:   11/03/21 0756 11/03/21 1241  BP:  (!) 117/58  Pulse:  95  Resp:  19  Temp:  98.4 F (36.9 C)  SpO2: 98% 99%   Vitals:   11/02/21 2208 11/03/21 0616 11/03/21 0756 11/03/21 1241  BP: (!) 109/54 114/67  (!) 117/58  Pulse: 86 92  95  Resp: 19 (!) 21  19  Temp: 98.5 F (36.9 C) 98.8 F (37.1 C)  98.4 F (36.9 C)  TempSrc:      SpO2: 100% 100% 98% 99%  Weight:      Height:        General: Pt is alert, awake, not in acute distress Cardiovascular: RRR, S1/S2 +, no rubs, no gallops Respiratory: CTA bilaterally, no wheezing, no rhonchi Abdominal: Soft, NT, ND, bowel sounds + Extremities: no edema, no cyanosis    The results of significant diagnostics from this hospitalization (including  imaging, microbiology, ancillary and laboratory) are listed below for reference.     Microbiology: No results found for this or any previous visit (from the past 240 hour(s)).   Labs: BNP (last 3 results) No results for input(s): "BNP" in the last 8760 hours. Basic Metabolic Panel: Recent Labs  Lab 10/31/21 2210 11/02/21 0432  NA 138 138  K 4.6 4.1  CL 107 109  CO2 24 24  GLUCOSE 138* 84  BUN 20 17  CREATININE 1.15 0.77  CALCIUM 8.2* 7.6*   Liver Function Tests: Recent Labs  Lab 10/31/21 2210 11/02/21 0432  AST 20 11*  ALT 26 17  ALKPHOS 80 59  BILITOT 1.1 0.9  PROT 6.5 5.4*  ALBUMIN 2.1* 1.8*   No results for input(s): "LIPASE", "AMYLASE" in the last 168 hours. No results for input(s): "AMMONIA" in the last 168 hours. CBC: Recent Labs  Lab 10/31/21 2210 11/01/21 0828 11/01/21 1238 11/02/21 0432 11/02/21 0751 11/02/21 1301 11/02/21 1526 11/02/21 1818 11/03/21 0524 11/03/21 1029  WBC 13.5* 9.4  --  7.1  --   --   --   --  7.3  --   NEUTROABS 11.2*  --   --   --   --   --   --   --   --   --    HGB 7.6* 7.0*   < > 8.1*   < > 9.2* 8.7* 8.2* 7.9* 8.4*  HCT 23.8* 21.3*   < > 24.7*   < > 28.4* 27.1* 24.5* 24.3* 25.6*  MCV 94.1 91.0  --  91.5  --   --   --   --  92.0  --   PLT 417* 299  --  304  --   --   --   --  336  --    < > = values in this interval not displayed.   Cardiac Enzymes: No results for input(s): "CKTOTAL", "CKMB", "CKMBINDEX", "TROPONINI" in the last 168 hours. BNP: Invalid input(s): "POCBNP" CBG: No results for input(s): "GLUCAP" in the last 168 hours. D-Dimer No results for input(s): "DDIMER" in the last 72 hours. Hgb A1c No results for input(s): "HGBA1C" in the last 72 hours. Lipid Profile No results for input(s): "CHOL", "HDL", "LDLCALC", "TRIG", "CHOLHDL", "LDLDIRECT" in the last 72 hours. Thyroid function studies No results for input(s): "TSH", "T4TOTAL", "T3FREE", "THYROIDAB" in the last 72 hours.  Invalid input(s): "FREET3" Anemia work up No results for input(s): "VITAMINB12", "FOLATE", "FERRITIN", "TIBC", "IRON", "RETICCTPCT" in the last 72 hours. Urinalysis    Component Value Date/Time   COLORURINE YELLOW 05/09/2017 1705   APPEARANCEUR Clear 12/28/2020 1408   LABSPEC 1.017 05/09/2017 1705   PHURINE 5.0 05/09/2017 1705   GLUCOSEU Negative 12/28/2020 1408   HGBUR MODERATE (A) 05/09/2017 1705   BILIRUBINUR Negative 12/28/2020 1408   KETONESUR NEGATIVE 05/09/2017 1705   PROTEINUR Trace (A) 12/28/2020 1408   PROTEINUR NEGATIVE 05/09/2017 1705   UROBILINOGEN 0.2 09/10/2019 1535   UROBILINOGEN 0.2 09/13/2010 0850   NITRITE Negative 12/28/2020 1408   NITRITE NEGATIVE 05/09/2017 1705   LEUKOCYTESUR Negative 12/28/2020 1408   Sepsis Labs Recent Labs  Lab 10/31/21 2210 11/01/21 0828 11/02/21 0432 11/03/21 0524  WBC 13.5* 9.4 7.1 7.3   Microbiology No results found for this or any previous visit (from the past 240 hour(s)).   Time coordinating discharge: 35  minutes  SIGNED:  Georgette Shell, MD  [facility] 11/03/2021,  1:30 PM

## 2021-11-03 NOTE — Telephone Encounter (Signed)
Please arrange hospital follow-up visit with either Dr. Jenetta Downer or [facility] in 4 to 6 weeks.  Thank you

## 2021-11-05 ENCOUNTER — Encounter (HOSPITAL_COMMUNITY): Payer: Self-pay | Admitting: Emergency Medicine

## 2021-11-05 ENCOUNTER — Emergency Department (HOSPITAL_COMMUNITY): Payer: Medicare HMO

## 2021-11-05 ENCOUNTER — Emergency Department (HOSPITAL_COMMUNITY)
Admission: EM | Admit: 2021-11-05 | Discharge: 2021-11-06 | Disposition: A | Discharge status: Home / Self Care | Payer: Medicare HMO | Attending: Emergency Medicine | Admitting: Emergency Medicine

## 2021-11-05 ENCOUNTER — Other Ambulatory Visit: Payer: Self-pay

## 2021-11-05 DIAGNOSIS — M25511 Pain in right shoulder: Secondary | ICD-10-CM | POA: Insufficient documentation

## 2021-11-05 DIAGNOSIS — J449 Chronic obstructive pulmonary disease, unspecified: Secondary | ICD-10-CM | POA: Diagnosis not present

## 2021-11-05 DIAGNOSIS — Z981 Arthrodesis status: Secondary | ICD-10-CM | POA: Diagnosis not present

## 2021-11-05 DIAGNOSIS — Z043 Encounter for examination and observation following other accident: Secondary | ICD-10-CM | POA: Diagnosis not present

## 2021-11-05 DIAGNOSIS — M47814 Spondylosis without myelopathy or radiculopathy, thoracic region: Secondary | ICD-10-CM | POA: Diagnosis not present

## 2021-11-05 DIAGNOSIS — N390 Urinary tract infection, site not specified: Secondary | ICD-10-CM | POA: Diagnosis not present

## 2021-11-05 DIAGNOSIS — R791 Abnormal coagulation profile: Secondary | ICD-10-CM | POA: Diagnosis not present

## 2021-11-05 DIAGNOSIS — I10 Essential (primary) hypertension: Secondary | ICD-10-CM | POA: Diagnosis not present

## 2021-11-05 DIAGNOSIS — N3090 Cystitis, unspecified without hematuria: Secondary | ICD-10-CM | POA: Diagnosis not present

## 2021-11-05 DIAGNOSIS — M19011 Primary osteoarthritis, right shoulder: Secondary | ICD-10-CM | POA: Diagnosis not present

## 2021-11-05 DIAGNOSIS — W19XXXA Unspecified fall, initial encounter: Secondary | ICD-10-CM | POA: Diagnosis not present

## 2021-11-05 DIAGNOSIS — G8929 Other chronic pain: Secondary | ICD-10-CM | POA: Diagnosis not present

## 2021-11-05 DIAGNOSIS — M19012 Primary osteoarthritis, left shoulder: Secondary | ICD-10-CM | POA: Diagnosis not present

## 2021-11-05 DIAGNOSIS — R531 Weakness: Secondary | ICD-10-CM | POA: Diagnosis not present

## 2021-11-05 LAB — HEPATIC FUNCTION PANEL
ALT: 20 U/L (ref 0–44)
AST: 16 U/L (ref 15–41)
Albumin: 2.3 g/dL — ABNORMAL LOW (ref 3.5–5.0)
Alkaline Phosphatase: 68 U/L (ref 38–126)
Bilirubin, Direct: 0.2 mg/dL (ref 0.0–0.2)
Indirect Bilirubin: 0.5 mg/dL (ref 0.3–0.9)
Total Bilirubin: 0.7 mg/dL (ref 0.3–1.2)
Total Protein: 6.8 g/dL (ref 6.5–8.1)

## 2021-11-05 LAB — LACTIC ACID, PLASMA: Lactic Acid, Venous: 1 mmol/L (ref 0.5–1.9)

## 2021-11-05 LAB — BASIC METABOLIC PANEL
Anion gap: 6 (ref 5–15)
BUN: 10 mg/dL (ref 8–23)
CO2: 25 mmol/L (ref 22–32)
Calcium: 8 mg/dL — ABNORMAL LOW (ref 8.9–10.3)
Chloride: 107 mmol/L (ref 98–111)
Creatinine, Ser: 0.73 mg/dL (ref 0.61–1.24)
GFR, Estimated: >60 mL/min (ref >60)
Glucose, Bld: 134 mg/dL — ABNORMAL HIGH (ref 70–99)
Potassium: 3.7 mmol/L (ref 3.5–5.1)
Sodium: 138 mmol/L (ref 135–145)

## 2021-11-05 LAB — CBC
HCT: 27.1 % — ABNORMAL LOW (ref 39.0–52.0)
Hemoglobin: 8.9 g/dL — ABNORMAL LOW (ref 13.0–17.0)
MCH: 30.3 pg (ref 26.0–34.0)
MCHC: 32.8 g/dL (ref 30.0–36.0)
MCV: 92.2 fL (ref 80.0–100.0)
Platelets: 413 10*3/uL — ABNORMAL HIGH (ref 150–400)
RBC: 2.94 MIL/uL — ABNORMAL LOW (ref 4.22–5.81)
RDW: 14.4 % (ref 11.5–15.5)
WBC: 10 10*3/uL (ref 4.0–10.5)
nRBC: 0 % (ref 0.0–0.2)

## 2021-11-05 LAB — URINALYSIS, ROUTINE W REFLEX MICROSCOPIC
Bilirubin Urine: NEGATIVE
Glucose, UA: NEGATIVE mg/dL
Ketones, ur: NEGATIVE mg/dL
Leukocytes,Ua: NEGATIVE
Nitrite: NEGATIVE
Protein, ur: NEGATIVE mg/dL
Specific Gravity, Urine: 1.013 (ref 1.005–1.030)
pH: 7 (ref 5.0–8.0)

## 2021-11-05 LAB — SEDIMENTATION RATE: Sed Rate: 71 mm/hr — ABNORMAL HIGH (ref 0–16)

## 2021-11-05 LAB — C-REACTIVE PROTEIN: CRP: 14.7 mg/dL — ABNORMAL HIGH (ref <1.0)

## 2021-11-05 LAB — CBG MONITORING, ED: Glucose-Capillary: 118 mg/dL — ABNORMAL HIGH (ref 70–99)

## 2021-11-05 LAB — PROTIME-INR
INR: 1.3 — ABNORMAL HIGH (ref 0.8–1.2)
Prothrombin Time: 15.9 seconds — ABNORMAL HIGH (ref 11.4–15.2)

## 2021-11-05 MED ORDER — LACTATED RINGERS IV BOLUS (SEPSIS)
1000.0000 mL | Freq: Once | INTRAVENOUS | Status: AC
Start: 2021-11-05 — End: 2021-11-05
  Administered 2021-11-05: 1000 mL via INTRAVENOUS

## 2021-11-05 MED ORDER — SODIUM CHLORIDE 0.9 % IV SOLN
1.0000 g | Freq: Once | INTRAVENOUS | Status: AC
  Administered 2021-11-05: 1 g via INTRAVENOUS
  Filled 2021-11-05: qty 10

## 2021-11-05 MED ORDER — CEPHALEXIN 500 MG PO CAPS: 500.0000 mg | ORAL_CAPSULE | Freq: Four times a day (QID) | ORAL | 0 refills | Status: DC

## 2021-11-05 MED ORDER — FENTANYL CITRATE PF 50 MCG/ML IJ SOSY
50.0000 ug | PREFILLED_SYRINGE | Freq: Once | INTRAMUSCULAR | Status: AC
  Administered 2021-11-05: 50 ug via INTRAVENOUS
  Filled 2021-11-05: qty 1

## 2021-11-05 NOTE — ED Provider Notes (Signed)
[facility] EMERGENCY DEPARTMENT Provider Note   CSN: [id number] Arrival date & time: 11/05/21  1150     History {Add pertinent medical, surgical, social history, OB history to HPI:1} Chief Complaint  Patient presents with   Weakness    Randy Russo is a 81 y.o. male.   Weakness Associated symptoms: arthralgias   Patient presents for generalized weakness, fall, and worsened right shoulder pain.  Medical history includes COPD, HTN, syncope, GERD, colitis, BPH.  He had a recent hospitalization for a GI bleed.  During that time, he received 2 units of blood.  He was discharged 2 days ago.  Patient currently lives at home alone.  He arrives in the ED with his daughter.  Patient had a fall today that he describes as due to generalized weakness while walking.  He has had poor p.o. intake since his hospitalization.  When his daughter came to check on him, she noticed worsened pain and redness to the area of his right shoulder.  Patient has had right shoulder pain for the past 3 weeks.  He was seen in the ED on 7/22.  He did follow-up with orthopedic surgery (Dr. Alvan Dame).  He underwent arthrocentesis 1 week ago.  Patient is not sure if he reinjured his right shoulder during this fall.  Patient and daughter report worsening pain and swelling over the past several days.      Home Medications Prior to Admission medications   Medication Sig Start Date End Date Taking? Authorizing Provider  acetaminophen (TYLENOL) 500 MG tablet Take 1,000 mg by mouth every 6 (six) hours as needed for mild pain.    [provider]  albuterol (PROVENTIL) (2.5 MG/3ML) 0.083% nebulizer solution Take 2.5 mg by nebulization every 6 (six) hours as needed for wheezing or shortness of breath.    [provider]  albuterol (VENTOLIN HFA) 108 (90 Base) MCG/ACT inhaler Inhale 1-2 puffs into the lungs every 6 (six) hours as needed for wheezing or shortness of breath.    [provider]   cholecalciferol (VITAMIN D) 400 units TABS tablet Take 400 Units by mouth daily.     [provider]  Omega-3 Fatty Acids (FISH OIL) 1200 MG CAPS Take 1,200 mg by mouth daily.    [provider]  SPIRIVA HANDIHALER 18 MCG inhalation capsule Place 18 mcg into inhaler and inhale daily. 04/13/19   [provider]      Allergies    Patient has no known allergies.    Review of Systems   Review of Systems  Constitutional:  Positive for activity change, appetite change and fatigue.  Musculoskeletal:  Positive for arthralgias and joint swelling.  Neurological:  Positive for weakness (Generalized).    Physical Exam Updated Vital Signs BP (!) 121/59   Pulse 88   Temp 98.7 F (37.1 C) (Oral)   Resp 17   Ht '5\' 6"'$  (1.676 m)   Wt 83.9 kg   SpO2 97%   BMI 29.86 kg/m  Physical Exam Vitals and nursing note reviewed.  Constitutional:      General: He is not in acute distress.    Appearance: He is well-developed and normal weight. He is ill-appearing. He is not toxic-appearing or diaphoretic.  HENT:     Head: Normocephalic and atraumatic.     Right Ear: External ear normal.     Left Ear: External ear normal.     Nose: Nose normal.     Mouth/Throat:     Mouth: Mucous  membranes are moist.     Pharynx: Oropharynx is clear.     Comments: Edentulous Eyes:     Extraocular Movements: Extraocular movements intact.     Conjunctiva/sclera: Conjunctivae normal.  Cardiovascular:     Rate and Rhythm: Normal rate and regular rhythm.     Pulses: Normal pulses.     Heart sounds: No murmur heard. Pulmonary:     Effort: Pulmonary effort is normal. No respiratory distress.     Breath sounds: Normal breath sounds. No wheezing or rales.  Abdominal:     General: There is no distension.     Palpations: Abdomen is soft.     Tenderness: There is no abdominal tenderness.  Musculoskeletal:        General: Swelling and tenderness present.     Cervical back: Normal range of  motion and neck supple.  Skin:    General: Skin is warm and dry.     Findings: Erythema present.  Neurological:     General: No focal deficit present.     Mental Status: He is alert and oriented to person, place, and time.     Cranial Nerves: No cranial nerve deficit.     Sensory: No sensory deficit.     Motor: No weakness.     Coordination: Coordination normal.  Psychiatric:        Mood and Affect: Mood normal.        Behavior: Behavior normal.        Thought Content: Thought content normal.        Judgment: Judgment normal.     ED Results / Procedures / Treatments   Labs (all labs ordered are listed, but only abnormal results are displayed) Labs Reviewed  BASIC METABOLIC PANEL - Abnormal; Notable for the following components:      Result Value   Glucose, Bld 134 (*)    Calcium 8.0 (*)    All other components within normal limits  CBC - Abnormal; Notable for the following components:   RBC 2.94 (*)    Hemoglobin 8.9 (*)    HCT 27.1 (*)    Platelets 413 (*)    All other components within normal limits  CBG MONITORING, ED - Abnormal; Notable for the following components:   Glucose-Capillary 118 (*)    All other components within normal limits  URINALYSIS, ROUTINE W REFLEX MICROSCOPIC    EKG None  Radiology DG Shoulder Right  Result Date: 11/05/2021 CLINICAL DATA:  Weakness after a fall EXAM: RIGHT SHOULDER - 2+ VIEW COMPARISON:  10/13/2021 FINDINGS: Advanced degenerative changes of the acromioclavicular and glenohumeral joints again identified. The humeral head projects inferior to the central portion of the glenoid on the initial image. On the scapular view, projects minimally anterior to the central glenoid. Visualized portion of the right hemithorax is normal. Suspect soft tissue swelling about the deltoid. IMPRESSION: No fracture identified on two view imaging. Inferior and anterior position of the humerus relative to the central glenoid could be projectional. If  dislocation is a concern, consider axillary view. Advanced degenerative changes. Probable soft tissue swelling/hematoma about the deltoid musculature. Electronically Signed   By: Abigail Miyamoto M.D.   On: 11/05/2021 13:18    Procedures Procedures  {Document cardiac monitor, telemetry assessment procedure when appropriate:1}  Medications Ordered in ED Medications - No data to display  ED Course/ Medical Decision Making/ A&P  Medical Decision Making Amount and/or Complexity of Data Reviewed Labs: ordered. Radiology: ordered.  Risk Prescription drug management.   This patient presents to the ED for concern of ***, this involves an extensive number of treatment options, and is a complaint that carries with it a high risk of complications and morbidity.  The differential diagnosis includes ***   Co morbidities that complicate the patient evaluation  ***   Additional history obtained:  Additional history obtained from *** External records from outside source obtained and reviewed including ***   Lab Tests:  I Ordered, and personally interpreted labs.  The pertinent results include:  ***   Imaging Studies ordered:  I ordered imaging studies including ***  I independently visualized and interpreted imaging which showed *** I agree with the radiologist interpretation   Cardiac Monitoring: / EKG:  The patient was maintained on a cardiac monitor.  I personally viewed and interpreted the cardiac monitored which showed an underlying rhythm of: ***   Consultations Obtained:  I requested consultation with the ***,  and discussed lab and imaging findings as well as pertinent plan - they recommend: ***   Problem List / ED Course / Critical interventions / Medication management  *** I ordered medication including ***  for ***  Reevaluation of the patient after these medicines showed that the patient {resolved/improved/worsened:23923::"improved"} I  have reviewed the patients home medicines and have made adjustments as needed   Social Determinants of Health:  ***   Test / Admission - Considered:  ***   {Document critical care time when appropriate:1} {Document review of labs and clinical decision tools ie heart score, Chads2Vasc2 etc:1}  {Document your independent review of radiology images, and any outside records:1} {Document your discussion with family members, caretakers, and with consultants:1} {Document social determinants of health affecting pt's care:1} {Document your decision making why or why not admission, treatments were needed:1} Final Clinical Impression(s) / ED Diagnoses Final diagnoses:  None    Rx / DC Orders ED Discharge Orders     None

## 2021-11-05 NOTE — Discharge Instructions (Addendum)
Follow-up with Dr. Alvan Dame for ongoing management of your right shoulder.  A prescription for an antibiotic was sent to your pharmacy for treatment of a UTI.  Take this as prescribed, starting tomorrow.  You can follow-up on results of your urine culture through your primary care doctor.  Return to the emergency department at any time for any new or worsening symptoms of concern.

## 2021-11-05 NOTE — ED Triage Notes (Signed)
Pt presents with increased weakness to right arm and generalized weakness, per daughter pt fell this morning and right arm is becoming weaker instead of stronger.

## 2021-11-08 LAB — URINE CULTURE: Culture: >=100000 — AB

## 2021-11-09 ENCOUNTER — Telehealth: Payer: Self-pay | Admitting: *Deleted

## 2021-11-09 NOTE — Telephone Encounter (Signed)
Post ED Visit - Positive Culture Follow-up: Unsuccessful Patient Follow-up  Culture assessed and recommendations reviewed by:  '[]'$  Elenor Quinones, Pharm.D. '[]'$  Heide Guile, Pharm.D., BCPS AQ-ID '[]'$  Parks Neptune, Pharm.D., BCPS '[]'$  Alycia Rossetti, Pharm.D., BCPS '[]'$  [place], [place].D., BCPS, AAHIVP '[]'$  Legrand Como, Pharm.D., BCPS, AAHIVP '[]'$  Wynell Balloon, PharmD '[]'$  Vincenza Hews, PharmD, BCPS  Positive urine culture  '[]'$  Patient discharged without antimicrobial prescription and treatment is now indicated '[x]'$  Organism is resistant to prescribed ED discharge antimicrobial '[]'$  Patient with positive blood cultures  Plan:  Stop Keflex.  Start Cipro '250mg'$  PO q12hrs x 5 days, Astrid Drafts, PA-C  Unable to contact patient after 3 attempts, letter will be sent to address on file  Ardeen Fillers 11/09/2021, 9:59 AM

## 2021-11-09 NOTE — Progress Notes (Signed)
ED Antimicrobial Stewardship Positive Culture Follow Up   Randy Russo is an 81 y.o. male who presented to [facility] on 11/05/2021 with a chief complaint of  Chief Complaint  Patient presents with   Weakness    Recent Results (from the past 720 hour(s))  Urine Culture     Status: Abnormal   Collection Time: 11/05/21  3:28 PM   Specimen: Urine, Clean Catch  Result Value Ref Range Status   Specimen Description   Final    URINE, CLEAN CATCH Performed at [facility], [address], [place], [place] [address]    Special Requests   Final    NONE Performed at [facility], [address], [place], [place] [address]    Culture >=100,000 COLONIES/mL CITROBACTER KOSERI (A)  Final   Report Status 11/08/2021 FINAL  Final   Organism ID, Bacteria CITROBACTER KOSERI (A)  Final      Susceptibility   Citrobacter koseri - MIC*    CEFAZOLIN <=4 SENSITIVE Sensitive     CEFEPIME <=0.12 SENSITIVE Sensitive     CEFTRIAXONE <=0.25 SENSITIVE Sensitive     CIPROFLOXACIN <=0.25 SENSITIVE Sensitive     GENTAMICIN <=1 SENSITIVE Sensitive     IMIPENEM <=0.25 SENSITIVE Sensitive     NITROFURANTOIN 64 INTERMEDIATE Intermediate     TRIMETH/SULFA <=20 SENSITIVE Sensitive     PIP/TAZO <=4 SENSITIVE Sensitive     * >=100,000 COLONIES/mL CITROBACTER KOSERI  Blood Culture (routine x 2)     Status: None (Preliminary result)   Collection Time: 11/05/21  3:38 PM   Specimen: Left Antecubital; Blood  Result Value Ref Range Status   Specimen Description LEFT ANTECUBITAL  Final   Special Requests   Final    BOTTLES DRAWN AEROBIC AND ANAEROBIC Blood Culture adequate volume   Culture   Final    NO GROWTH 3 DAYS Performed at [facility], [address] [place], [place] [address]    Report Status PENDING  Incomplete  Blood Culture (routine x 2)     Status: None (Preliminary result)   Collection Time: 11/05/21  3:59 PM   Specimen: BLOOD LEFT ARM  Result Value Ref Range Status   Specimen Description BLOOD LEFT  ARM  Final   Special Requests   Final    BOTTLES DRAWN AEROBIC AND ANAEROBIC Blood Culture adequate volume   Culture   Final    NO GROWTH 3 DAYS Performed at [facility], [address], [place], [place] [address]    Report Status PENDING  Incomplete    '[x]'$  Treated with cephalexin, organism resistant to prescribed antimicrobial  New antibiotic prescription: Stop cephalexin. Start ciprofloxacin '250mg'$  PO q12h x 5 days.  ED Provider: Genevive Bi, PA-C   Margretta Sidle Dohlen 11/09/2021, 9:02 AM Clinical Pharmacist Monday - Friday phone -  [phone] Saturday - Sunday phone - [phone]

## 2021-11-10 LAB — CULTURE, BLOOD (ROUTINE X 2)
Culture: NO GROWTH
Culture: NO GROWTH
Special Requests: ADEQUATE
Special Requests: ADEQUATE

## 2021-11-15 DIAGNOSIS — E663 Overweight: Secondary | ICD-10-CM | POA: Diagnosis not present

## 2021-11-15 DIAGNOSIS — K922 Gastrointestinal hemorrhage, unspecified: Secondary | ICD-10-CM | POA: Diagnosis not present

## 2021-11-15 DIAGNOSIS — M1711 Unilateral primary osteoarthritis, right knee: Secondary | ICD-10-CM | POA: Diagnosis not present

## 2021-11-15 DIAGNOSIS — C61 Malignant neoplasm of prostate: Secondary | ICD-10-CM | POA: Diagnosis not present

## 2021-11-15 DIAGNOSIS — N390 Urinary tract infection, site not specified: Secondary | ICD-10-CM | POA: Diagnosis not present

## 2021-11-15 DIAGNOSIS — I471 Supraventricular tachycardia: Secondary | ICD-10-CM | POA: Diagnosis not present

## 2021-11-15 DIAGNOSIS — Z6825 Body mass index (BMI) 25.0-25.9, adult: Secondary | ICD-10-CM | POA: Diagnosis not present

## 2021-11-15 DIAGNOSIS — M7981 Nontraumatic hematoma of soft tissue: Secondary | ICD-10-CM | POA: Diagnosis not present

## 2021-11-15 DIAGNOSIS — L0390 Cellulitis, unspecified: Secondary | ICD-10-CM | POA: Diagnosis not present

## 2021-11-20 ENCOUNTER — Telehealth: Payer: Self-pay | Admitting: Emergency Medicine

## 2021-11-22 DIAGNOSIS — E782 Mixed hyperlipidemia: Secondary | ICD-10-CM | POA: Diagnosis not present

## 2021-11-22 DIAGNOSIS — J449 Chronic obstructive pulmonary disease, unspecified: Secondary | ICD-10-CM | POA: Diagnosis not present

## 2021-12-13 NOTE — Patient Instructions (Signed)
DUE TO COVID-19 ONLY TWO VISITORS  (aged 81 and older)  ARE ALLOWED TO COME WITH YOU AND STAY IN THE WAITING ROOM ONLY DURING PRE OP AND PROCEDURE.   **NO VISITORS ARE ALLOWED IN THE SHORT STAY AREA OR RECOVERY ROOM!!**  IF YOU WILL BE ADMITTED INTO THE HOSPITAL YOU ARE ALLOWED ONLY FOUR SUPPORT PEOPLE DURING VISITATION HOURS ONLY (7 AM -8PM)   The support person(s) must pass our screening, gel in and out, and wear a mask at all times, including in the patient's room. Patients must also wear a mask when staff or their support person are in the room. Visitors GUEST BADGE MUST BE WORN VISIBLY  One adult visitor may remain with you overnight and MUST be in the room by 8 P.M.     Your procedure is scheduled on: 12/27/21   Report to [facility] Main Entrance    Report to admitting at : 5:30 AM   Call this number if you have problems the morning of surgery [phone]   Do not eat food :After Midnight.   After Midnight you may have the following liquids until : 4:30  AM DAY OF SURGERY  Water Black Coffee (sugar ok, NO MILK/CREAM OR CREAMERS)  Tea (sugar ok, NO MILK/CREAM OR CREAMERS) regular and decaf                             Plain Jell-O (NO RED)                                           Fruit ices (not with fruit pulp, NO RED)                                     Popsicles (NO RED)                                                                  Juice: apple, WHITE grape, WHITE cranberry Sports drinks like Gatorade (NO RED)              Drink  Ensure drink AT: 4:30 AM the day of surgery.     The day of surgery:  Drink ONE (1) Pre-Surgery Clear Ensure or G2 at AM the morning of surgery. Drink in one sitting. Do not sip.  This drink was given to you during your hospital  pre-op appointment visit. Nothing else to drink after completing the  Pre-Surgery Clear Ensure or G2.          If you have questions, please contact your surgeon's office.    Oral Hygiene is also  important to reduce your risk of infection.                                    Remember - BRUSH YOUR TEETH THE MORNING OF SURGERY WITH YOUR REGULAR TOOTHPASTE   Do NOT smoke after Midnight   Take these medicines the morning of surgery with  A SIP OF WATER: Tylenol as needed.Use inhalers as usual.  DO NOT TAKE ANY ORAL DIABETIC MEDICATIONS DAY OF YOUR SURGERY  Bring CPAP mask and tubing day of surgery.                              You may not have any metal on your body including hair pins, jewelry, and body piercing             Do not wear  lotions, powders, perfumes/cologne, or deodorant              Men may shave face and neck.   Do not bring valuables to the hospital. [place].   Contacts, dentures or bridgework may not be worn into surgery.   Bring small overnight bag day of surgery.   DO NOT [place]. PHARMACY WILL DISPENSE MEDICATIONS LISTED ON YOUR MEDICATION LIST TO YOU DURING YOUR ADMISSION [place]!    Patients discharged on the day of surgery will not be allowed to drive home.  Someone NEEDS to stay with you for the first 24 hours after anesthesia.   Special Instructions: Bring a copy of your healthcare power of attorney and living will documents         the day of surgery if you haven't scanned them before.              Please read over the following fact sheets you were given: IF YOU HAVE QUESTIONS ABOUT YOUR PRE-OP INSTRUCTIONS PLEASE CALL [phone]     [facility] - Preparing for Surgery Before surgery, you can play an important role.  Because skin is not sterile, your skin needs to be as free of germs as possible.  You can reduce the number of germs on your skin by washing with CHG (chlorahexidine gluconate) soap before surgery.  CHG is an antiseptic cleaner which kills germs and bonds with the skin to continue killing germs even after washing. Please DO NOT use if you have  an allergy to CHG or antibacterial soaps.  If your skin becomes reddened/irritated stop using the CHG and inform your nurse when you arrive at Short Stay. Do not shave (including legs and underarms) for at least 48 hours prior to the first CHG shower.  You may shave your face/neck. Please follow these instructions carefully:  1.  Shower with CHG Soap the night before surgery and the  morning of Surgery.  2.  If you choose to wash your hair, wash your hair first as usual with your  normal  shampoo.  3.  After you shampoo, rinse your hair and body thoroughly to remove the  shampoo.                           4.  Use CHG as you would any other liquid soap.  You can apply chg directly  to the skin and wash                       Gently with a scrungie or clean washcloth.  5.  Apply the CHG Soap to your body ONLY FROM THE NECK DOWN.   Do not use on face/ open  Wound or open sores. Avoid contact with eyes, ears mouth and genitals (private parts).                       Wash face,  Genitals (private parts) with your normal soap.             6.  Wash thoroughly, paying special attention to the area where your surgery  will be performed.  7.  Thoroughly rinse your body with warm water from the neck down.  8.  DO NOT shower/wash with your normal soap after using and rinsing off  the CHG Soap.                9.  Pat yourself dry with a clean towel.            10.  Wear clean pajamas.            11.  Place clean sheets on your bed the night of your first shower and do not  sleep with pets. Day of Surgery : Do not apply any lotions/deodorants the morning of surgery.  Please wear clean clothes to the hospital/surgery center.  FAILURE TO FOLLOW THESE INSTRUCTIONS MAY RESULT IN THE CANCELLATION OF YOUR SURGERY PATIENT SIGNATURE_________________________________  NURSE SIGNATURE__________________________________  ________________________________________________________________________    Adam Phenix  An incentive spirometer is a tool that can help keep your lungs clear and active. This tool measures how well you are filling your lungs with each breath. Taking long deep breaths may help reverse or decrease the chance of developing breathing (pulmonary) problems (especially infection) following: A long period of time when you are unable to move or be active. BEFORE THE PROCEDURE  If the spirometer includes an indicator to show your best effort, your nurse or respiratory therapist will set it to a desired goal. If possible, sit up straight or lean slightly forward. Try not to slouch. Hold the incentive spirometer in an upright position. INSTRUCTIONS FOR USE  Sit on the edge of your bed if possible, or sit up as far as you can in bed or on a chair. Hold the incentive spirometer in an upright position. Breathe out normally. Place the mouthpiece in your mouth and seal your lips tightly around it. Breathe in slowly and as deeply as possible, raising the piston or the ball toward the top of the column. Hold your breath for 3-5 seconds or for as long as possible. Allow the piston or ball to fall to the bottom of the column. Remove the mouthpiece from your mouth and breathe out normally. Rest for a few seconds and repeat Steps 1 through 7 at least 10 times every 1-2 hours when you are awake. Take your time and take a few normal breaths between deep breaths. The spirometer may include an indicator to show your best effort. Use the indicator as a goal to work toward during each repetition. After each set of 10 deep breaths, practice coughing to be sure your lungs are clear. If you have an incision (the cut made at the time of surgery), support your incision when coughing by placing a pillow or rolled up towels firmly against it. Once you are able to get out of bed, walk around indoors and cough well. You may stop using the incentive spirometer when instructed by your caregiver.   RISKS AND COMPLICATIONS Take your time so you do not get dizzy or light-headed. If you are in pain, you may need to take or  ask for pain medication before doing incentive spirometry. It is harder to take a deep breath if you are having pain. AFTER USE Rest and breathe slowly and easily. It can be helpful to keep track of a log of your progress. Your caregiver can provide you with a simple table to help with this. If you are using the spirometer at home, follow these instructions: [place] IF:  You are having difficultly using the spirometer. You have trouble using the spirometer as often as instructed. Your pain medication is not giving enough relief while using the spirometer. You develop fever of 100.5 F (38.1 C) or higher. SEEK IMMEDIATE MEDICAL CARE IF:  You cough up bloody sputum that had not been present before. You develop fever of 102 F (38.9 C) or greater. You develop worsening pain at or near the incision site. MAKE SURE YOU:  Understand these instructions. Will watch your condition. Will get help right away if you are not doing well or get worse. Document Released: 07/22/2006 Document Revised: 06/03/2011 Document Reviewed: 09/22/2006 [facility] Patient Information 2014 [place], [place].   ________________________________________________________________________

## 2021-12-14 ENCOUNTER — Encounter (HOSPITAL_COMMUNITY)
Admission: RE | Admit: 2021-12-14 | Discharge: 2021-12-14 | Disposition: A | Discharge status: Home / Self Care | Payer: Medicare HMO | Source: Ambulatory Visit | Attending: Anesthesiology | Admitting: Anesthesiology

## 2021-12-14 DIAGNOSIS — Z01818 Encounter for other preprocedural examination: Secondary | ICD-10-CM

## 2021-12-14 DIAGNOSIS — I10 Essential (primary) hypertension: Secondary | ICD-10-CM

## 2021-12-14 NOTE — Progress Notes (Signed)
Pt. Did not showed up for PAT appointment today,as per his daughter she was unable to bring him to the appointment.

## 2021-12-20 ENCOUNTER — Ambulatory Visit (HOSPITAL_COMMUNITY): Payer: Medicare HMO | Admitting: Occupational Therapy

## 2021-12-25 ENCOUNTER — Encounter (HOSPITAL_COMMUNITY): Payer: Medicare HMO

## 2021-12-27 ENCOUNTER — Ambulatory Visit (HOSPITAL_COMMUNITY): Admission: RE | Admit: 2021-12-27 | Payer: Medicare HMO | Source: Ambulatory Visit | Admitting: Orthopedic Surgery

## 2021-12-27 ENCOUNTER — Encounter (HOSPITAL_COMMUNITY): Admission: RE | Payer: Self-pay | Source: Ambulatory Visit

## 2021-12-27 DIAGNOSIS — M1711 Unilateral primary osteoarthritis, right knee: Secondary | ICD-10-CM

## 2021-12-27 SURGERY — ARTHROPLASTY, KNEE, TOTAL
Anesthesia: Spinal | Site: Knee | Laterality: Right

## 2021-12-28 DIAGNOSIS — M25511 Pain in right shoulder: Secondary | ICD-10-CM | POA: Diagnosis not present

## 2022-01-08 DIAGNOSIS — M75121 Complete rotator cuff tear or rupture of right shoulder, not specified as traumatic: Secondary | ICD-10-CM | POA: Diagnosis not present

## 2022-01-10 ENCOUNTER — Ambulatory Visit (INDEPENDENT_AMBULATORY_CARE_PROVIDER_SITE_OTHER): Payer: Medicare HMO | Admitting: Gastroenterology

## 2022-05-02 DIAGNOSIS — J449 Chronic obstructive pulmonary disease, unspecified: Secondary | ICD-10-CM | POA: Diagnosis not present

## 2022-05-02 DIAGNOSIS — R600 Localized edema: Secondary | ICD-10-CM | POA: Diagnosis not present

## 2022-05-02 DIAGNOSIS — E663 Overweight: Secondary | ICD-10-CM | POA: Diagnosis not present

## 2022-05-02 DIAGNOSIS — Z6829 Body mass index (BMI) 29.0-29.9, adult: Secondary | ICD-10-CM | POA: Diagnosis not present

## 2022-05-02 DIAGNOSIS — R638 Other symptoms and signs concerning food and fluid intake: Secondary | ICD-10-CM | POA: Diagnosis not present

## 2022-05-02 DIAGNOSIS — E782 Mixed hyperlipidemia: Secondary | ICD-10-CM | POA: Diagnosis not present

## 2022-10-09 DIAGNOSIS — M25561 Pain in right knee: Secondary | ICD-10-CM | POA: Diagnosis not present

## 2022-10-09 DIAGNOSIS — M1711 Unilateral primary osteoarthritis, right knee: Secondary | ICD-10-CM | POA: Diagnosis not present

## 2022-11-27 DIAGNOSIS — G319 Degenerative disease of nervous system, unspecified: Secondary | ICD-10-CM | POA: Diagnosis not present

## 2022-11-27 DIAGNOSIS — E876 Hypokalemia: Secondary | ICD-10-CM | POA: Diagnosis not present

## 2022-11-27 DIAGNOSIS — S0990XA Unspecified injury of head, initial encounter: Secondary | ICD-10-CM | POA: Diagnosis not present

## 2022-11-27 DIAGNOSIS — K219 Gastro-esophageal reflux disease without esophagitis: Secondary | ICD-10-CM | POA: Diagnosis not present

## 2022-11-27 DIAGNOSIS — R1311 Dysphagia, oral phase: Secondary | ICD-10-CM | POA: Diagnosis not present

## 2022-11-27 DIAGNOSIS — G8911 Acute pain due to trauma: Secondary | ICD-10-CM | POA: Diagnosis not present

## 2022-11-27 DIAGNOSIS — M6281 Muscle weakness (generalized): Secondary | ICD-10-CM | POA: Diagnosis not present

## 2022-11-27 DIAGNOSIS — R54 Age-related physical debility: Secondary | ICD-10-CM | POA: Diagnosis not present

## 2022-11-27 DIAGNOSIS — S12031D Nondisplaced posterior arch fracture of first cervical vertebra, subsequent encounter for fracture with routine healing: Secondary | ICD-10-CM | POA: Diagnosis not present

## 2022-11-27 DIAGNOSIS — R262 Difficulty in walking, not elsewhere classified: Secondary | ICD-10-CM | POA: Diagnosis not present

## 2022-11-27 DIAGNOSIS — Z743 Need for continuous supervision: Secondary | ICD-10-CM | POA: Diagnosis not present

## 2022-11-27 DIAGNOSIS — J90 Pleural effusion, not elsewhere classified: Secondary | ICD-10-CM | POA: Diagnosis not present

## 2022-11-27 DIAGNOSIS — M8588 Other specified disorders of bone density and structure, other site: Secondary | ICD-10-CM | POA: Diagnosis not present

## 2022-11-27 DIAGNOSIS — I509 Heart failure, unspecified: Secondary | ICD-10-CM | POA: Diagnosis not present

## 2022-11-27 DIAGNOSIS — M546 Pain in thoracic spine: Secondary | ICD-10-CM | POA: Diagnosis not present

## 2022-11-27 DIAGNOSIS — N390 Urinary tract infection, site not specified: Secondary | ICD-10-CM | POA: Diagnosis not present

## 2022-11-27 DIAGNOSIS — W19XXXA Unspecified fall, initial encounter: Secondary | ICD-10-CM | POA: Diagnosis not present

## 2022-11-27 DIAGNOSIS — I959 Hypotension, unspecified: Secondary | ICD-10-CM | POA: Diagnosis not present

## 2022-11-27 DIAGNOSIS — W1839XA Other fall on same level, initial encounter: Secondary | ICD-10-CM | POA: Diagnosis not present

## 2022-11-27 DIAGNOSIS — S3991XA Unspecified injury of abdomen, initial encounter: Secondary | ICD-10-CM | POA: Diagnosis not present

## 2022-11-27 DIAGNOSIS — R41841 Cognitive communication deficit: Secondary | ICD-10-CM | POA: Diagnosis not present

## 2022-11-27 DIAGNOSIS — Z1152 Encounter for screening for COVID-19: Secondary | ICD-10-CM | POA: Diagnosis not present

## 2022-11-27 DIAGNOSIS — S12091A Other nondisplaced fracture of first cervical vertebra, initial encounter for closed fracture: Secondary | ICD-10-CM | POA: Diagnosis not present

## 2022-11-27 DIAGNOSIS — R000 Tachycardia, unspecified: Secondary | ICD-10-CM | POA: Diagnosis not present

## 2022-11-27 DIAGNOSIS — S1201XA Stable burst fracture of first cervical vertebra, initial encounter for closed fracture: Secondary | ICD-10-CM | POA: Diagnosis not present

## 2022-11-27 DIAGNOSIS — Z96641 Presence of right artificial hip joint: Secondary | ICD-10-CM | POA: Diagnosis not present

## 2022-11-27 DIAGNOSIS — S4992XA Unspecified injury of left shoulder and upper arm, initial encounter: Secondary | ICD-10-CM | POA: Diagnosis not present

## 2022-11-27 DIAGNOSIS — Z7401 Bed confinement status: Secondary | ICD-10-CM | POA: Diagnosis not present

## 2022-11-27 DIAGNOSIS — I110 Hypertensive heart disease with heart failure: Secondary | ICD-10-CM | POA: Diagnosis not present

## 2022-11-27 DIAGNOSIS — S12000D Unspecified displaced fracture of first cervical vertebra, subsequent encounter for fracture with routine healing: Secondary | ICD-10-CM | POA: Diagnosis not present

## 2022-11-27 DIAGNOSIS — J449 Chronic obstructive pulmonary disease, unspecified: Secondary | ICD-10-CM | POA: Diagnosis not present

## 2022-11-27 DIAGNOSIS — W19XXXD Unspecified fall, subsequent encounter: Secondary | ICD-10-CM | POA: Diagnosis not present

## 2022-11-27 DIAGNOSIS — S3993XA Unspecified injury of pelvis, initial encounter: Secondary | ICD-10-CM | POA: Diagnosis not present

## 2022-11-27 DIAGNOSIS — S299XXA Unspecified injury of thorax, initial encounter: Secondary | ICD-10-CM | POA: Diagnosis not present

## 2022-11-27 DIAGNOSIS — R531 Weakness: Secondary | ICD-10-CM | POA: Diagnosis not present

## 2022-11-27 DIAGNOSIS — M24411 Recurrent dislocation, right shoulder: Secondary | ICD-10-CM | POA: Diagnosis not present

## 2022-11-27 DIAGNOSIS — Z471 Aftercare following joint replacement surgery: Secondary | ICD-10-CM | POA: Diagnosis not present

## 2022-11-27 DIAGNOSIS — S12000A Unspecified displaced fracture of first cervical vertebra, initial encounter for closed fracture: Secondary | ICD-10-CM | POA: Diagnosis not present

## 2022-11-27 DIAGNOSIS — S42131A Displaced fracture of coracoid process, right shoulder, initial encounter for closed fracture: Secondary | ICD-10-CM | POA: Diagnosis not present

## 2022-11-27 DIAGNOSIS — R410 Disorientation, unspecified: Secondary | ICD-10-CM | POA: Diagnosis not present

## 2022-11-27 DIAGNOSIS — Z5989 Other problems related to housing and economic circumstances: Secondary | ICD-10-CM | POA: Diagnosis not present

## 2022-11-27 DIAGNOSIS — I10 Essential (primary) hypertension: Secondary | ICD-10-CM | POA: Diagnosis not present

## 2022-11-27 DIAGNOSIS — Z792 Long term (current) use of antibiotics: Secondary | ICD-10-CM | POA: Diagnosis not present

## 2022-11-27 DIAGNOSIS — Z79899 Other long term (current) drug therapy: Secondary | ICD-10-CM | POA: Diagnosis not present

## 2022-11-27 DIAGNOSIS — R5381 Other malaise: Secondary | ICD-10-CM | POA: Diagnosis not present

## 2022-11-27 DIAGNOSIS — Z043 Encounter for examination and observation following other accident: Secondary | ICD-10-CM | POA: Diagnosis not present

## 2022-11-27 DIAGNOSIS — S43004A Unspecified dislocation of right shoulder joint, initial encounter: Secondary | ICD-10-CM | POA: Diagnosis not present

## 2022-11-27 DIAGNOSIS — S12101A Unspecified nondisplaced fracture of second cervical vertebra, initial encounter for closed fracture: Secondary | ICD-10-CM | POA: Diagnosis not present

## 2022-11-27 DIAGNOSIS — S43014A Anterior dislocation of right humerus, initial encounter: Secondary | ICD-10-CM | POA: Diagnosis not present

## 2022-11-27 DIAGNOSIS — I6782 Cerebral ischemia: Secondary | ICD-10-CM | POA: Diagnosis not present

## 2022-11-27 DIAGNOSIS — R4182 Altered mental status, unspecified: Secondary | ICD-10-CM | POA: Diagnosis not present

## 2022-11-27 DIAGNOSIS — X58XXXD Exposure to other specified factors, subsequent encounter: Secondary | ICD-10-CM | POA: Diagnosis not present

## 2022-11-27 DIAGNOSIS — S12001A Unspecified nondisplaced fracture of first cervical vertebra, initial encounter for closed fracture: Secondary | ICD-10-CM | POA: Diagnosis not present

## 2022-12-04 DIAGNOSIS — R262 Difficulty in walking, not elsewhere classified: Secondary | ICD-10-CM | POA: Diagnosis not present

## 2022-12-04 DIAGNOSIS — M6281 Muscle weakness (generalized): Secondary | ICD-10-CM | POA: Diagnosis not present

## 2022-12-04 DIAGNOSIS — S129XXD Fracture of neck, unspecified, subsequent encounter: Secondary | ICD-10-CM | POA: Diagnosis not present

## 2022-12-04 DIAGNOSIS — R41841 Cognitive communication deficit: Secondary | ICD-10-CM | POA: Diagnosis not present

## 2022-12-04 DIAGNOSIS — R531 Weakness: Secondary | ICD-10-CM | POA: Diagnosis not present

## 2022-12-04 DIAGNOSIS — E559 Vitamin D deficiency, unspecified: Secondary | ICD-10-CM | POA: Diagnosis not present

## 2022-12-04 DIAGNOSIS — S12000D Unspecified displaced fracture of first cervical vertebra, subsequent encounter for fracture with routine healing: Secondary | ICD-10-CM | POA: Diagnosis not present

## 2022-12-04 DIAGNOSIS — N390 Urinary tract infection, site not specified: Secondary | ICD-10-CM | POA: Diagnosis not present

## 2022-12-04 DIAGNOSIS — R5381 Other malaise: Secondary | ICD-10-CM | POA: Diagnosis not present

## 2022-12-04 DIAGNOSIS — H409 Unspecified glaucoma: Secondary | ICD-10-CM | POA: Diagnosis not present

## 2022-12-04 DIAGNOSIS — Z7401 Bed confinement status: Secondary | ICD-10-CM | POA: Diagnosis not present

## 2022-12-04 DIAGNOSIS — Z981 Arthrodesis status: Secondary | ICD-10-CM | POA: Diagnosis not present

## 2022-12-04 DIAGNOSIS — S12001A Unspecified nondisplaced fracture of first cervical vertebra, initial encounter for closed fracture: Secondary | ICD-10-CM | POA: Diagnosis not present

## 2022-12-04 DIAGNOSIS — X58XXXD Exposure to other specified factors, subsequent encounter: Secondary | ICD-10-CM | POA: Diagnosis not present

## 2022-12-04 DIAGNOSIS — I509 Heart failure, unspecified: Secondary | ICD-10-CM | POA: Diagnosis not present

## 2022-12-04 DIAGNOSIS — R54 Age-related physical debility: Secondary | ICD-10-CM | POA: Diagnosis not present

## 2022-12-04 DIAGNOSIS — I10 Essential (primary) hypertension: Secondary | ICD-10-CM | POA: Diagnosis not present

## 2022-12-04 DIAGNOSIS — J449 Chronic obstructive pulmonary disease, unspecified: Secondary | ICD-10-CM | POA: Diagnosis not present

## 2022-12-04 DIAGNOSIS — K219 Gastro-esophageal reflux disease without esophagitis: Secondary | ICD-10-CM | POA: Diagnosis not present

## 2022-12-04 DIAGNOSIS — S12101A Unspecified nondisplaced fracture of second cervical vertebra, initial encounter for closed fracture: Secondary | ICD-10-CM | POA: Diagnosis not present

## 2022-12-04 DIAGNOSIS — E876 Hypokalemia: Secondary | ICD-10-CM | POA: Diagnosis not present

## 2022-12-04 DIAGNOSIS — I110 Hypertensive heart disease with heart failure: Secondary | ICD-10-CM | POA: Diagnosis not present

## 2022-12-04 DIAGNOSIS — R1311 Dysphagia, oral phase: Secondary | ICD-10-CM | POA: Diagnosis not present

## 2022-12-04 DIAGNOSIS — S12031D Nondisplaced posterior arch fracture of first cervical vertebra, subsequent encounter for fracture with routine healing: Secondary | ICD-10-CM | POA: Diagnosis not present

## 2022-12-04 DIAGNOSIS — S129XXA Fracture of neck, unspecified, initial encounter: Secondary | ICD-10-CM | POA: Diagnosis not present

## 2022-12-09 DIAGNOSIS — S129XXA Fracture of neck, unspecified, initial encounter: Secondary | ICD-10-CM | POA: Diagnosis not present

## 2022-12-09 DIAGNOSIS — N390 Urinary tract infection, site not specified: Secondary | ICD-10-CM | POA: Diagnosis not present

## 2022-12-09 DIAGNOSIS — R5381 Other malaise: Secondary | ICD-10-CM | POA: Diagnosis not present

## 2022-12-10 DIAGNOSIS — H409 Unspecified glaucoma: Secondary | ICD-10-CM | POA: Diagnosis not present

## 2022-12-10 DIAGNOSIS — R54 Age-related physical debility: Secondary | ICD-10-CM | POA: Diagnosis not present

## 2022-12-10 DIAGNOSIS — J449 Chronic obstructive pulmonary disease, unspecified: Secondary | ICD-10-CM | POA: Diagnosis not present

## 2022-12-10 DIAGNOSIS — S12031D Nondisplaced posterior arch fracture of first cervical vertebra, subsequent encounter for fracture with routine healing: Secondary | ICD-10-CM | POA: Diagnosis not present

## 2022-12-10 DIAGNOSIS — N390 Urinary tract infection, site not specified: Secondary | ICD-10-CM | POA: Diagnosis not present

## 2022-12-10 DIAGNOSIS — I10 Essential (primary) hypertension: Secondary | ICD-10-CM | POA: Diagnosis not present

## 2022-12-18 DIAGNOSIS — S129XXD Fracture of neck, unspecified, subsequent encounter: Secondary | ICD-10-CM | POA: Diagnosis not present

## 2022-12-18 DIAGNOSIS — S12001A Unspecified nondisplaced fracture of first cervical vertebra, initial encounter for closed fracture: Secondary | ICD-10-CM | POA: Diagnosis not present

## 2022-12-18 DIAGNOSIS — S12000D Unspecified displaced fracture of first cervical vertebra, subsequent encounter for fracture with routine healing: Secondary | ICD-10-CM | POA: Diagnosis not present

## 2022-12-18 DIAGNOSIS — Z981 Arthrodesis status: Secondary | ICD-10-CM | POA: Diagnosis not present

## 2022-12-18 DIAGNOSIS — S12101A Unspecified nondisplaced fracture of second cervical vertebra, initial encounter for closed fracture: Secondary | ICD-10-CM | POA: Diagnosis not present

## 2022-12-20 DIAGNOSIS — I10 Essential (primary) hypertension: Secondary | ICD-10-CM | POA: Diagnosis not present

## 2022-12-20 DIAGNOSIS — R5381 Other malaise: Secondary | ICD-10-CM | POA: Diagnosis not present

## 2022-12-20 DIAGNOSIS — S129XXA Fracture of neck, unspecified, initial encounter: Secondary | ICD-10-CM | POA: Diagnosis not present

## 2022-12-20 DIAGNOSIS — N390 Urinary tract infection, site not specified: Secondary | ICD-10-CM | POA: Diagnosis not present

## 2022-12-20 DIAGNOSIS — S12031D Nondisplaced posterior arch fracture of first cervical vertebra, subsequent encounter for fracture with routine healing: Secondary | ICD-10-CM | POA: Diagnosis not present

## 2022-12-20 DIAGNOSIS — H409 Unspecified glaucoma: Secondary | ICD-10-CM | POA: Diagnosis not present

## 2022-12-20 DIAGNOSIS — R54 Age-related physical debility: Secondary | ICD-10-CM | POA: Diagnosis not present

## 2022-12-20 DIAGNOSIS — J449 Chronic obstructive pulmonary disease, unspecified: Secondary | ICD-10-CM | POA: Diagnosis not present

## 2022-12-22 DIAGNOSIS — N39498 Other specified urinary incontinence: Secondary | ICD-10-CM | POA: Diagnosis not present

## 2022-12-22 DIAGNOSIS — M1990 Unspecified osteoarthritis, unspecified site: Secondary | ICD-10-CM | POA: Diagnosis not present

## 2022-12-22 DIAGNOSIS — H409 Unspecified glaucoma: Secondary | ICD-10-CM | POA: Diagnosis not present

## 2022-12-22 DIAGNOSIS — I509 Heart failure, unspecified: Secondary | ICD-10-CM | POA: Diagnosis not present

## 2022-12-22 DIAGNOSIS — N401 Enlarged prostate with lower urinary tract symptoms: Secondary | ICD-10-CM | POA: Diagnosis not present

## 2022-12-22 DIAGNOSIS — J449 Chronic obstructive pulmonary disease, unspecified: Secondary | ICD-10-CM | POA: Diagnosis not present

## 2022-12-22 DIAGNOSIS — I110 Hypertensive heart disease with heart failure: Secondary | ICD-10-CM | POA: Diagnosis not present

## 2022-12-22 DIAGNOSIS — S12031D Nondisplaced posterior arch fracture of first cervical vertebra, subsequent encounter for fracture with routine healing: Secondary | ICD-10-CM | POA: Diagnosis not present

## 2022-12-23 DIAGNOSIS — H409 Unspecified glaucoma: Secondary | ICD-10-CM | POA: Diagnosis not present

## 2022-12-23 DIAGNOSIS — J449 Chronic obstructive pulmonary disease, unspecified: Secondary | ICD-10-CM | POA: Diagnosis not present

## 2022-12-23 DIAGNOSIS — I110 Hypertensive heart disease with heart failure: Secondary | ICD-10-CM | POA: Diagnosis not present

## 2022-12-23 DIAGNOSIS — M1990 Unspecified osteoarthritis, unspecified site: Secondary | ICD-10-CM | POA: Diagnosis not present

## 2022-12-23 DIAGNOSIS — N401 Enlarged prostate with lower urinary tract symptoms: Secondary | ICD-10-CM | POA: Diagnosis not present

## 2022-12-23 DIAGNOSIS — I509 Heart failure, unspecified: Secondary | ICD-10-CM | POA: Diagnosis not present

## 2022-12-23 DIAGNOSIS — S12031D Nondisplaced posterior arch fracture of first cervical vertebra, subsequent encounter for fracture with routine healing: Secondary | ICD-10-CM | POA: Diagnosis not present

## 2022-12-23 DIAGNOSIS — N39498 Other specified urinary incontinence: Secondary | ICD-10-CM | POA: Diagnosis not present

## 2022-12-27 DIAGNOSIS — I110 Hypertensive heart disease with heart failure: Secondary | ICD-10-CM | POA: Diagnosis not present

## 2022-12-27 DIAGNOSIS — M1990 Unspecified osteoarthritis, unspecified site: Secondary | ICD-10-CM | POA: Diagnosis not present

## 2022-12-27 DIAGNOSIS — S12031D Nondisplaced posterior arch fracture of first cervical vertebra, subsequent encounter for fracture with routine healing: Secondary | ICD-10-CM | POA: Diagnosis not present

## 2022-12-27 DIAGNOSIS — I509 Heart failure, unspecified: Secondary | ICD-10-CM | POA: Diagnosis not present

## 2022-12-27 DIAGNOSIS — N401 Enlarged prostate with lower urinary tract symptoms: Secondary | ICD-10-CM | POA: Diagnosis not present

## 2022-12-27 DIAGNOSIS — J449 Chronic obstructive pulmonary disease, unspecified: Secondary | ICD-10-CM | POA: Diagnosis not present

## 2022-12-27 DIAGNOSIS — H409 Unspecified glaucoma: Secondary | ICD-10-CM | POA: Diagnosis not present

## 2022-12-27 DIAGNOSIS — N39498 Other specified urinary incontinence: Secondary | ICD-10-CM | POA: Diagnosis not present

## 2022-12-30 DIAGNOSIS — R296 Repeated falls: Secondary | ICD-10-CM | POA: Diagnosis not present

## 2022-12-30 DIAGNOSIS — N401 Enlarged prostate with lower urinary tract symptoms: Secondary | ICD-10-CM | POA: Diagnosis not present

## 2022-12-30 DIAGNOSIS — I509 Heart failure, unspecified: Secondary | ICD-10-CM | POA: Diagnosis not present

## 2022-12-30 DIAGNOSIS — I110 Hypertensive heart disease with heart failure: Secondary | ICD-10-CM | POA: Diagnosis not present

## 2022-12-30 DIAGNOSIS — Z6829 Body mass index (BMI) 29.0-29.9, adult: Secondary | ICD-10-CM | POA: Diagnosis not present

## 2022-12-30 DIAGNOSIS — Z23 Encounter for immunization: Secondary | ICD-10-CM | POA: Diagnosis not present

## 2022-12-30 DIAGNOSIS — M1990 Unspecified osteoarthritis, unspecified site: Secondary | ICD-10-CM | POA: Diagnosis not present

## 2022-12-30 DIAGNOSIS — M4842XS Fatigue fracture of vertebra, cervical region, sequela of fracture: Secondary | ICD-10-CM | POA: Diagnosis not present

## 2022-12-30 DIAGNOSIS — S12031D Nondisplaced posterior arch fracture of first cervical vertebra, subsequent encounter for fracture with routine healing: Secondary | ICD-10-CM | POA: Diagnosis not present

## 2022-12-30 DIAGNOSIS — J449 Chronic obstructive pulmonary disease, unspecified: Secondary | ICD-10-CM | POA: Diagnosis not present

## 2022-12-30 DIAGNOSIS — E6609 Other obesity due to excess calories: Secondary | ICD-10-CM | POA: Diagnosis not present

## 2022-12-30 DIAGNOSIS — N39498 Other specified urinary incontinence: Secondary | ICD-10-CM | POA: Diagnosis not present

## 2022-12-30 DIAGNOSIS — H409 Unspecified glaucoma: Secondary | ICD-10-CM | POA: Diagnosis not present

## 2022-12-31 DIAGNOSIS — J449 Chronic obstructive pulmonary disease, unspecified: Secondary | ICD-10-CM | POA: Diagnosis not present

## 2022-12-31 DIAGNOSIS — I509 Heart failure, unspecified: Secondary | ICD-10-CM | POA: Diagnosis not present

## 2022-12-31 DIAGNOSIS — I110 Hypertensive heart disease with heart failure: Secondary | ICD-10-CM | POA: Diagnosis not present

## 2022-12-31 DIAGNOSIS — S12031D Nondisplaced posterior arch fracture of first cervical vertebra, subsequent encounter for fracture with routine healing: Secondary | ICD-10-CM | POA: Diagnosis not present

## 2023-01-02 DIAGNOSIS — S12031D Nondisplaced posterior arch fracture of first cervical vertebra, subsequent encounter for fracture with routine healing: Secondary | ICD-10-CM | POA: Diagnosis not present

## 2023-01-02 DIAGNOSIS — N401 Enlarged prostate with lower urinary tract symptoms: Secondary | ICD-10-CM | POA: Diagnosis not present

## 2023-01-02 DIAGNOSIS — N39498 Other specified urinary incontinence: Secondary | ICD-10-CM | POA: Diagnosis not present

## 2023-01-02 DIAGNOSIS — J449 Chronic obstructive pulmonary disease, unspecified: Secondary | ICD-10-CM | POA: Diagnosis not present

## 2023-01-02 DIAGNOSIS — H409 Unspecified glaucoma: Secondary | ICD-10-CM | POA: Diagnosis not present

## 2023-01-02 DIAGNOSIS — M1990 Unspecified osteoarthritis, unspecified site: Secondary | ICD-10-CM | POA: Diagnosis not present

## 2023-01-02 DIAGNOSIS — I110 Hypertensive heart disease with heart failure: Secondary | ICD-10-CM | POA: Diagnosis not present

## 2023-01-02 DIAGNOSIS — I509 Heart failure, unspecified: Secondary | ICD-10-CM | POA: Diagnosis not present

## 2023-01-03 DIAGNOSIS — M1990 Unspecified osteoarthritis, unspecified site: Secondary | ICD-10-CM | POA: Diagnosis not present

## 2023-01-03 DIAGNOSIS — H409 Unspecified glaucoma: Secondary | ICD-10-CM | POA: Diagnosis not present

## 2023-01-03 DIAGNOSIS — I110 Hypertensive heart disease with heart failure: Secondary | ICD-10-CM | POA: Diagnosis not present

## 2023-01-03 DIAGNOSIS — J449 Chronic obstructive pulmonary disease, unspecified: Secondary | ICD-10-CM | POA: Diagnosis not present

## 2023-01-03 DIAGNOSIS — N401 Enlarged prostate with lower urinary tract symptoms: Secondary | ICD-10-CM | POA: Diagnosis not present

## 2023-01-03 DIAGNOSIS — I509 Heart failure, unspecified: Secondary | ICD-10-CM | POA: Diagnosis not present

## 2023-01-03 DIAGNOSIS — S12031D Nondisplaced posterior arch fracture of first cervical vertebra, subsequent encounter for fracture with routine healing: Secondary | ICD-10-CM | POA: Diagnosis not present

## 2023-01-03 DIAGNOSIS — N39498 Other specified urinary incontinence: Secondary | ICD-10-CM | POA: Diagnosis not present

## 2023-01-06 DIAGNOSIS — I110 Hypertensive heart disease with heart failure: Secondary | ICD-10-CM | POA: Diagnosis not present

## 2023-01-06 DIAGNOSIS — M1990 Unspecified osteoarthritis, unspecified site: Secondary | ICD-10-CM | POA: Diagnosis not present

## 2023-01-06 DIAGNOSIS — I509 Heart failure, unspecified: Secondary | ICD-10-CM | POA: Diagnosis not present

## 2023-01-06 DIAGNOSIS — S12031D Nondisplaced posterior arch fracture of first cervical vertebra, subsequent encounter for fracture with routine healing: Secondary | ICD-10-CM | POA: Diagnosis not present

## 2023-01-06 DIAGNOSIS — N39498 Other specified urinary incontinence: Secondary | ICD-10-CM | POA: Diagnosis not present

## 2023-01-06 DIAGNOSIS — H409 Unspecified glaucoma: Secondary | ICD-10-CM | POA: Diagnosis not present

## 2023-01-06 DIAGNOSIS — J449 Chronic obstructive pulmonary disease, unspecified: Secondary | ICD-10-CM | POA: Diagnosis not present

## 2023-01-06 DIAGNOSIS — N401 Enlarged prostate with lower urinary tract symptoms: Secondary | ICD-10-CM | POA: Diagnosis not present

## 2023-01-09 DIAGNOSIS — S12031D Nondisplaced posterior arch fracture of first cervical vertebra, subsequent encounter for fracture with routine healing: Secondary | ICD-10-CM | POA: Diagnosis not present

## 2023-01-09 DIAGNOSIS — H409 Unspecified glaucoma: Secondary | ICD-10-CM | POA: Diagnosis not present

## 2023-01-09 DIAGNOSIS — J449 Chronic obstructive pulmonary disease, unspecified: Secondary | ICD-10-CM | POA: Diagnosis not present

## 2023-01-09 DIAGNOSIS — M1990 Unspecified osteoarthritis, unspecified site: Secondary | ICD-10-CM | POA: Diagnosis not present

## 2023-01-09 DIAGNOSIS — N39498 Other specified urinary incontinence: Secondary | ICD-10-CM | POA: Diagnosis not present

## 2023-01-09 DIAGNOSIS — I110 Hypertensive heart disease with heart failure: Secondary | ICD-10-CM | POA: Diagnosis not present

## 2023-01-09 DIAGNOSIS — N401 Enlarged prostate with lower urinary tract symptoms: Secondary | ICD-10-CM | POA: Diagnosis not present

## 2023-01-09 DIAGNOSIS — I509 Heart failure, unspecified: Secondary | ICD-10-CM | POA: Diagnosis not present

## 2023-01-10 DIAGNOSIS — S12031D Nondisplaced posterior arch fracture of first cervical vertebra, subsequent encounter for fracture with routine healing: Secondary | ICD-10-CM | POA: Diagnosis not present

## 2023-01-10 DIAGNOSIS — J449 Chronic obstructive pulmonary disease, unspecified: Secondary | ICD-10-CM | POA: Diagnosis not present

## 2023-01-10 DIAGNOSIS — I110 Hypertensive heart disease with heart failure: Secondary | ICD-10-CM | POA: Diagnosis not present

## 2023-01-10 DIAGNOSIS — M1990 Unspecified osteoarthritis, unspecified site: Secondary | ICD-10-CM | POA: Diagnosis not present

## 2023-01-10 DIAGNOSIS — N39498 Other specified urinary incontinence: Secondary | ICD-10-CM | POA: Diagnosis not present

## 2023-01-10 DIAGNOSIS — H409 Unspecified glaucoma: Secondary | ICD-10-CM | POA: Diagnosis not present

## 2023-01-10 DIAGNOSIS — I509 Heart failure, unspecified: Secondary | ICD-10-CM | POA: Diagnosis not present

## 2023-01-10 DIAGNOSIS — N401 Enlarged prostate with lower urinary tract symptoms: Secondary | ICD-10-CM | POA: Diagnosis not present

## 2023-01-16 DIAGNOSIS — J449 Chronic obstructive pulmonary disease, unspecified: Secondary | ICD-10-CM | POA: Diagnosis not present

## 2023-01-16 DIAGNOSIS — I509 Heart failure, unspecified: Secondary | ICD-10-CM | POA: Diagnosis not present

## 2023-01-16 DIAGNOSIS — Z1331 Encounter for screening for depression: Secondary | ICD-10-CM | POA: Diagnosis not present

## 2023-01-16 DIAGNOSIS — E7849 Other hyperlipidemia: Secondary | ICD-10-CM | POA: Diagnosis not present

## 2023-01-16 DIAGNOSIS — N39498 Other specified urinary incontinence: Secondary | ICD-10-CM | POA: Diagnosis not present

## 2023-01-16 DIAGNOSIS — Z0001 Encounter for general adult medical examination with abnormal findings: Secondary | ICD-10-CM | POA: Diagnosis not present

## 2023-01-16 DIAGNOSIS — M1990 Unspecified osteoarthritis, unspecified site: Secondary | ICD-10-CM | POA: Diagnosis not present

## 2023-01-16 DIAGNOSIS — S12031D Nondisplaced posterior arch fracture of first cervical vertebra, subsequent encounter for fracture with routine healing: Secondary | ICD-10-CM | POA: Diagnosis not present

## 2023-01-16 DIAGNOSIS — H409 Unspecified glaucoma: Secondary | ICD-10-CM | POA: Diagnosis not present

## 2023-01-16 DIAGNOSIS — N401 Enlarged prostate with lower urinary tract symptoms: Secondary | ICD-10-CM | POA: Diagnosis not present

## 2023-01-16 DIAGNOSIS — E782 Mixed hyperlipidemia: Secondary | ICD-10-CM | POA: Diagnosis not present

## 2023-01-16 DIAGNOSIS — I110 Hypertensive heart disease with heart failure: Secondary | ICD-10-CM | POA: Diagnosis not present

## 2023-01-16 DIAGNOSIS — E6609 Other obesity due to excess calories: Secondary | ICD-10-CM | POA: Diagnosis not present

## 2023-01-16 DIAGNOSIS — C61 Malignant neoplasm of prostate: Secondary | ICD-10-CM | POA: Diagnosis not present

## 2023-01-16 DIAGNOSIS — Z6831 Body mass index (BMI) 31.0-31.9, adult: Secondary | ICD-10-CM | POA: Diagnosis not present

## 2023-01-17 DIAGNOSIS — S12031D Nondisplaced posterior arch fracture of first cervical vertebra, subsequent encounter for fracture with routine healing: Secondary | ICD-10-CM | POA: Diagnosis not present

## 2023-01-17 DIAGNOSIS — I110 Hypertensive heart disease with heart failure: Secondary | ICD-10-CM | POA: Diagnosis not present

## 2023-01-17 DIAGNOSIS — N401 Enlarged prostate with lower urinary tract symptoms: Secondary | ICD-10-CM | POA: Diagnosis not present

## 2023-01-17 DIAGNOSIS — H409 Unspecified glaucoma: Secondary | ICD-10-CM | POA: Diagnosis not present

## 2023-01-17 DIAGNOSIS — M1990 Unspecified osteoarthritis, unspecified site: Secondary | ICD-10-CM | POA: Diagnosis not present

## 2023-01-17 DIAGNOSIS — N39498 Other specified urinary incontinence: Secondary | ICD-10-CM | POA: Diagnosis not present

## 2023-01-17 DIAGNOSIS — I509 Heart failure, unspecified: Secondary | ICD-10-CM | POA: Diagnosis not present

## 2023-01-17 DIAGNOSIS — J449 Chronic obstructive pulmonary disease, unspecified: Secondary | ICD-10-CM | POA: Diagnosis not present

## 2023-01-20 DIAGNOSIS — I110 Hypertensive heart disease with heart failure: Secondary | ICD-10-CM | POA: Diagnosis not present

## 2023-01-20 DIAGNOSIS — I509 Heart failure, unspecified: Secondary | ICD-10-CM | POA: Diagnosis not present

## 2023-01-20 DIAGNOSIS — N401 Enlarged prostate with lower urinary tract symptoms: Secondary | ICD-10-CM | POA: Diagnosis not present

## 2023-01-20 DIAGNOSIS — M1990 Unspecified osteoarthritis, unspecified site: Secondary | ICD-10-CM | POA: Diagnosis not present

## 2023-01-20 DIAGNOSIS — S12031D Nondisplaced posterior arch fracture of first cervical vertebra, subsequent encounter for fracture with routine healing: Secondary | ICD-10-CM | POA: Diagnosis not present

## 2023-01-20 DIAGNOSIS — H409 Unspecified glaucoma: Secondary | ICD-10-CM | POA: Diagnosis not present

## 2023-01-20 DIAGNOSIS — N39498 Other specified urinary incontinence: Secondary | ICD-10-CM | POA: Diagnosis not present

## 2023-01-20 DIAGNOSIS — J449 Chronic obstructive pulmonary disease, unspecified: Secondary | ICD-10-CM | POA: Diagnosis not present

## 2023-01-21 DIAGNOSIS — I509 Heart failure, unspecified: Secondary | ICD-10-CM | POA: Diagnosis not present

## 2023-01-21 DIAGNOSIS — J449 Chronic obstructive pulmonary disease, unspecified: Secondary | ICD-10-CM | POA: Diagnosis not present

## 2023-01-21 DIAGNOSIS — N401 Enlarged prostate with lower urinary tract symptoms: Secondary | ICD-10-CM | POA: Diagnosis not present

## 2023-01-21 DIAGNOSIS — M1990 Unspecified osteoarthritis, unspecified site: Secondary | ICD-10-CM | POA: Diagnosis not present

## 2023-01-21 DIAGNOSIS — I110 Hypertensive heart disease with heart failure: Secondary | ICD-10-CM | POA: Diagnosis not present

## 2023-01-21 DIAGNOSIS — N39498 Other specified urinary incontinence: Secondary | ICD-10-CM | POA: Diagnosis not present

## 2023-01-21 DIAGNOSIS — H409 Unspecified glaucoma: Secondary | ICD-10-CM | POA: Diagnosis not present

## 2023-01-21 DIAGNOSIS — S12031D Nondisplaced posterior arch fracture of first cervical vertebra, subsequent encounter for fracture with routine healing: Secondary | ICD-10-CM | POA: Diagnosis not present

## 2023-01-22 DIAGNOSIS — S1201XA Stable burst fracture of first cervical vertebra, initial encounter for closed fracture: Secondary | ICD-10-CM | POA: Diagnosis not present

## 2023-01-22 DIAGNOSIS — S12000D Unspecified displaced fracture of first cervical vertebra, subsequent encounter for fracture with routine healing: Secondary | ICD-10-CM | POA: Diagnosis not present

## 2023-01-22 DIAGNOSIS — Z8781 Personal history of (healed) traumatic fracture: Secondary | ICD-10-CM | POA: Diagnosis not present

## 2023-01-24 DIAGNOSIS — N401 Enlarged prostate with lower urinary tract symptoms: Secondary | ICD-10-CM | POA: Diagnosis not present

## 2023-01-24 DIAGNOSIS — I110 Hypertensive heart disease with heart failure: Secondary | ICD-10-CM | POA: Diagnosis not present

## 2023-01-24 DIAGNOSIS — H409 Unspecified glaucoma: Secondary | ICD-10-CM | POA: Diagnosis not present

## 2023-01-24 DIAGNOSIS — M1990 Unspecified osteoarthritis, unspecified site: Secondary | ICD-10-CM | POA: Diagnosis not present

## 2023-01-24 DIAGNOSIS — I509 Heart failure, unspecified: Secondary | ICD-10-CM | POA: Diagnosis not present

## 2023-01-24 DIAGNOSIS — S12031D Nondisplaced posterior arch fracture of first cervical vertebra, subsequent encounter for fracture with routine healing: Secondary | ICD-10-CM | POA: Diagnosis not present

## 2023-01-24 DIAGNOSIS — N39498 Other specified urinary incontinence: Secondary | ICD-10-CM | POA: Diagnosis not present

## 2023-01-24 DIAGNOSIS — J449 Chronic obstructive pulmonary disease, unspecified: Secondary | ICD-10-CM | POA: Diagnosis not present

## 2023-01-27 DIAGNOSIS — M1990 Unspecified osteoarthritis, unspecified site: Secondary | ICD-10-CM | POA: Diagnosis not present

## 2023-01-27 DIAGNOSIS — J449 Chronic obstructive pulmonary disease, unspecified: Secondary | ICD-10-CM | POA: Diagnosis not present

## 2023-01-27 DIAGNOSIS — N39498 Other specified urinary incontinence: Secondary | ICD-10-CM | POA: Diagnosis not present

## 2023-01-27 DIAGNOSIS — H409 Unspecified glaucoma: Secondary | ICD-10-CM | POA: Diagnosis not present

## 2023-01-27 DIAGNOSIS — S12031D Nondisplaced posterior arch fracture of first cervical vertebra, subsequent encounter for fracture with routine healing: Secondary | ICD-10-CM | POA: Diagnosis not present

## 2023-01-27 DIAGNOSIS — I509 Heart failure, unspecified: Secondary | ICD-10-CM | POA: Diagnosis not present

## 2023-01-27 DIAGNOSIS — N401 Enlarged prostate with lower urinary tract symptoms: Secondary | ICD-10-CM | POA: Diagnosis not present

## 2023-01-27 DIAGNOSIS — I110 Hypertensive heart disease with heart failure: Secondary | ICD-10-CM | POA: Diagnosis not present

## 2023-01-31 DIAGNOSIS — S12031D Nondisplaced posterior arch fracture of first cervical vertebra, subsequent encounter for fracture with routine healing: Secondary | ICD-10-CM | POA: Diagnosis not present

## 2023-01-31 DIAGNOSIS — N401 Enlarged prostate with lower urinary tract symptoms: Secondary | ICD-10-CM | POA: Diagnosis not present

## 2023-01-31 DIAGNOSIS — I509 Heart failure, unspecified: Secondary | ICD-10-CM | POA: Diagnosis not present

## 2023-01-31 DIAGNOSIS — M1990 Unspecified osteoarthritis, unspecified site: Secondary | ICD-10-CM | POA: Diagnosis not present

## 2023-01-31 DIAGNOSIS — H409 Unspecified glaucoma: Secondary | ICD-10-CM | POA: Diagnosis not present

## 2023-01-31 DIAGNOSIS — J449 Chronic obstructive pulmonary disease, unspecified: Secondary | ICD-10-CM | POA: Diagnosis not present

## 2023-01-31 DIAGNOSIS — N39498 Other specified urinary incontinence: Secondary | ICD-10-CM | POA: Diagnosis not present

## 2023-01-31 DIAGNOSIS — I110 Hypertensive heart disease with heart failure: Secondary | ICD-10-CM | POA: Diagnosis not present

## 2023-02-03 DIAGNOSIS — S12031D Nondisplaced posterior arch fracture of first cervical vertebra, subsequent encounter for fracture with routine healing: Secondary | ICD-10-CM | POA: Diagnosis not present

## 2023-02-03 DIAGNOSIS — I70293 Other atherosclerosis of native arteries of extremities, bilateral legs: Secondary | ICD-10-CM | POA: Diagnosis not present

## 2023-02-03 DIAGNOSIS — B351 Tinea unguium: Secondary | ICD-10-CM | POA: Diagnosis not present

## 2023-02-03 DIAGNOSIS — J449 Chronic obstructive pulmonary disease, unspecified: Secondary | ICD-10-CM | POA: Diagnosis not present

## 2023-02-03 DIAGNOSIS — N39498 Other specified urinary incontinence: Secondary | ICD-10-CM | POA: Diagnosis not present

## 2023-02-03 DIAGNOSIS — M1990 Unspecified osteoarthritis, unspecified site: Secondary | ICD-10-CM | POA: Diagnosis not present

## 2023-02-03 DIAGNOSIS — I509 Heart failure, unspecified: Secondary | ICD-10-CM | POA: Diagnosis not present

## 2023-02-03 DIAGNOSIS — L603 Nail dystrophy: Secondary | ICD-10-CM | POA: Diagnosis not present

## 2023-02-03 DIAGNOSIS — L84 Corns and callosities: Secondary | ICD-10-CM | POA: Diagnosis not present

## 2023-02-03 DIAGNOSIS — N401 Enlarged prostate with lower urinary tract symptoms: Secondary | ICD-10-CM | POA: Diagnosis not present

## 2023-02-03 DIAGNOSIS — I110 Hypertensive heart disease with heart failure: Secondary | ICD-10-CM | POA: Diagnosis not present

## 2023-02-03 DIAGNOSIS — H409 Unspecified glaucoma: Secondary | ICD-10-CM | POA: Diagnosis not present

## 2023-02-05 DIAGNOSIS — I509 Heart failure, unspecified: Secondary | ICD-10-CM | POA: Diagnosis not present

## 2023-02-05 DIAGNOSIS — J449 Chronic obstructive pulmonary disease, unspecified: Secondary | ICD-10-CM | POA: Diagnosis not present

## 2023-02-05 DIAGNOSIS — N401 Enlarged prostate with lower urinary tract symptoms: Secondary | ICD-10-CM | POA: Diagnosis not present

## 2023-02-05 DIAGNOSIS — I110 Hypertensive heart disease with heart failure: Secondary | ICD-10-CM | POA: Diagnosis not present

## 2023-02-05 DIAGNOSIS — N39498 Other specified urinary incontinence: Secondary | ICD-10-CM | POA: Diagnosis not present

## 2023-02-05 DIAGNOSIS — M1990 Unspecified osteoarthritis, unspecified site: Secondary | ICD-10-CM | POA: Diagnosis not present

## 2023-02-05 DIAGNOSIS — S12031D Nondisplaced posterior arch fracture of first cervical vertebra, subsequent encounter for fracture with routine healing: Secondary | ICD-10-CM | POA: Diagnosis not present

## 2023-02-05 DIAGNOSIS — H409 Unspecified glaucoma: Secondary | ICD-10-CM | POA: Diagnosis not present

## 2023-02-07 DIAGNOSIS — H409 Unspecified glaucoma: Secondary | ICD-10-CM | POA: Diagnosis not present

## 2023-02-07 DIAGNOSIS — N400 Enlarged prostate without lower urinary tract symptoms: Secondary | ICD-10-CM | POA: Diagnosis not present

## 2023-02-07 DIAGNOSIS — S12031D Nondisplaced posterior arch fracture of first cervical vertebra, subsequent encounter for fracture with routine healing: Secondary | ICD-10-CM | POA: Diagnosis not present

## 2023-02-07 DIAGNOSIS — M1990 Unspecified osteoarthritis, unspecified site: Secondary | ICD-10-CM | POA: Diagnosis not present

## 2023-02-07 DIAGNOSIS — J449 Chronic obstructive pulmonary disease, unspecified: Secondary | ICD-10-CM | POA: Diagnosis not present

## 2023-02-07 DIAGNOSIS — C61 Malignant neoplasm of prostate: Secondary | ICD-10-CM | POA: Diagnosis not present

## 2023-02-07 DIAGNOSIS — N39498 Other specified urinary incontinence: Secondary | ICD-10-CM | POA: Diagnosis not present

## 2023-02-07 DIAGNOSIS — N281 Cyst of kidney, acquired: Secondary | ICD-10-CM | POA: Diagnosis not present

## 2023-02-07 DIAGNOSIS — I509 Heart failure, unspecified: Secondary | ICD-10-CM | POA: Diagnosis not present

## 2023-02-07 DIAGNOSIS — N401 Enlarged prostate with lower urinary tract symptoms: Secondary | ICD-10-CM | POA: Diagnosis not present

## 2023-02-07 DIAGNOSIS — I110 Hypertensive heart disease with heart failure: Secondary | ICD-10-CM | POA: Diagnosis not present

## 2023-02-10 DIAGNOSIS — E538 Deficiency of other specified B group vitamins: Secondary | ICD-10-CM | POA: Diagnosis not present

## 2023-02-10 DIAGNOSIS — Z6831 Body mass index (BMI) 31.0-31.9, adult: Secondary | ICD-10-CM | POA: Diagnosis not present

## 2023-02-10 DIAGNOSIS — D509 Iron deficiency anemia, unspecified: Secondary | ICD-10-CM | POA: Diagnosis not present

## 2023-02-10 DIAGNOSIS — E6609 Other obesity due to excess calories: Secondary | ICD-10-CM | POA: Diagnosis not present

## 2023-02-12 DIAGNOSIS — I509 Heart failure, unspecified: Secondary | ICD-10-CM | POA: Diagnosis not present

## 2023-02-12 DIAGNOSIS — J449 Chronic obstructive pulmonary disease, unspecified: Secondary | ICD-10-CM | POA: Diagnosis not present

## 2023-02-12 DIAGNOSIS — H409 Unspecified glaucoma: Secondary | ICD-10-CM | POA: Diagnosis not present

## 2023-02-12 DIAGNOSIS — I110 Hypertensive heart disease with heart failure: Secondary | ICD-10-CM | POA: Diagnosis not present

## 2023-02-12 DIAGNOSIS — N401 Enlarged prostate with lower urinary tract symptoms: Secondary | ICD-10-CM | POA: Diagnosis not present

## 2023-02-12 DIAGNOSIS — S12031D Nondisplaced posterior arch fracture of first cervical vertebra, subsequent encounter for fracture with routine healing: Secondary | ICD-10-CM | POA: Diagnosis not present

## 2023-02-12 DIAGNOSIS — M1990 Unspecified osteoarthritis, unspecified site: Secondary | ICD-10-CM | POA: Diagnosis not present

## 2023-02-12 DIAGNOSIS — N39498 Other specified urinary incontinence: Secondary | ICD-10-CM | POA: Diagnosis not present

## 2023-02-13 DIAGNOSIS — J449 Chronic obstructive pulmonary disease, unspecified: Secondary | ICD-10-CM | POA: Diagnosis not present

## 2023-02-13 DIAGNOSIS — I110 Hypertensive heart disease with heart failure: Secondary | ICD-10-CM | POA: Diagnosis not present

## 2023-02-13 DIAGNOSIS — I509 Heart failure, unspecified: Secondary | ICD-10-CM | POA: Diagnosis not present

## 2023-02-13 DIAGNOSIS — Z993 Dependence on wheelchair: Secondary | ICD-10-CM | POA: Diagnosis not present

## 2023-02-13 DIAGNOSIS — M1990 Unspecified osteoarthritis, unspecified site: Secondary | ICD-10-CM | POA: Diagnosis not present

## 2023-02-14 DIAGNOSIS — N39498 Other specified urinary incontinence: Secondary | ICD-10-CM | POA: Diagnosis not present

## 2023-02-14 DIAGNOSIS — I509 Heart failure, unspecified: Secondary | ICD-10-CM | POA: Diagnosis not present

## 2023-02-14 DIAGNOSIS — I110 Hypertensive heart disease with heart failure: Secondary | ICD-10-CM | POA: Diagnosis not present

## 2023-02-14 DIAGNOSIS — N401 Enlarged prostate with lower urinary tract symptoms: Secondary | ICD-10-CM | POA: Diagnosis not present

## 2023-02-14 DIAGNOSIS — S12031D Nondisplaced posterior arch fracture of first cervical vertebra, subsequent encounter for fracture with routine healing: Secondary | ICD-10-CM | POA: Diagnosis not present

## 2023-02-14 DIAGNOSIS — H409 Unspecified glaucoma: Secondary | ICD-10-CM | POA: Diagnosis not present

## 2023-02-14 DIAGNOSIS — J449 Chronic obstructive pulmonary disease, unspecified: Secondary | ICD-10-CM | POA: Diagnosis not present

## 2023-02-14 DIAGNOSIS — M1990 Unspecified osteoarthritis, unspecified site: Secondary | ICD-10-CM | POA: Diagnosis not present

## 2023-02-18 DIAGNOSIS — J449 Chronic obstructive pulmonary disease, unspecified: Secondary | ICD-10-CM | POA: Diagnosis not present

## 2023-02-18 DIAGNOSIS — M1990 Unspecified osteoarthritis, unspecified site: Secondary | ICD-10-CM | POA: Diagnosis not present

## 2023-02-18 DIAGNOSIS — N401 Enlarged prostate with lower urinary tract symptoms: Secondary | ICD-10-CM | POA: Diagnosis not present

## 2023-02-18 DIAGNOSIS — S12031D Nondisplaced posterior arch fracture of first cervical vertebra, subsequent encounter for fracture with routine healing: Secondary | ICD-10-CM | POA: Diagnosis not present

## 2023-02-18 DIAGNOSIS — N39498 Other specified urinary incontinence: Secondary | ICD-10-CM | POA: Diagnosis not present

## 2023-02-18 DIAGNOSIS — H409 Unspecified glaucoma: Secondary | ICD-10-CM | POA: Diagnosis not present

## 2023-02-18 DIAGNOSIS — I509 Heart failure, unspecified: Secondary | ICD-10-CM | POA: Diagnosis not present

## 2023-02-18 DIAGNOSIS — I110 Hypertensive heart disease with heart failure: Secondary | ICD-10-CM | POA: Diagnosis not present

## 2023-02-20 DIAGNOSIS — S12031D Nondisplaced posterior arch fracture of first cervical vertebra, subsequent encounter for fracture with routine healing: Secondary | ICD-10-CM | POA: Diagnosis not present

## 2023-02-20 DIAGNOSIS — N400 Enlarged prostate without lower urinary tract symptoms: Secondary | ICD-10-CM | POA: Diagnosis not present

## 2023-02-20 DIAGNOSIS — I110 Hypertensive heart disease with heart failure: Secondary | ICD-10-CM | POA: Diagnosis not present

## 2023-02-20 DIAGNOSIS — M170 Bilateral primary osteoarthritis of knee: Secondary | ICD-10-CM | POA: Diagnosis not present

## 2023-02-20 DIAGNOSIS — I509 Heart failure, unspecified: Secondary | ICD-10-CM | POA: Diagnosis not present

## 2023-02-20 DIAGNOSIS — J449 Chronic obstructive pulmonary disease, unspecified: Secondary | ICD-10-CM | POA: Diagnosis not present

## 2023-02-20 DIAGNOSIS — H269 Unspecified cataract: Secondary | ICD-10-CM | POA: Diagnosis not present

## 2023-02-20 DIAGNOSIS — K219 Gastro-esophageal reflux disease without esophagitis: Secondary | ICD-10-CM | POA: Diagnosis not present

## 2023-02-20 DIAGNOSIS — H409 Unspecified glaucoma: Secondary | ICD-10-CM | POA: Diagnosis not present

## 2023-02-24 DIAGNOSIS — S12031D Nondisplaced posterior arch fracture of first cervical vertebra, subsequent encounter for fracture with routine healing: Secondary | ICD-10-CM | POA: Diagnosis not present

## 2023-02-24 DIAGNOSIS — H409 Unspecified glaucoma: Secondary | ICD-10-CM | POA: Diagnosis not present

## 2023-02-24 DIAGNOSIS — J449 Chronic obstructive pulmonary disease, unspecified: Secondary | ICD-10-CM | POA: Diagnosis not present

## 2023-02-24 DIAGNOSIS — I509 Heart failure, unspecified: Secondary | ICD-10-CM | POA: Diagnosis not present

## 2023-02-24 DIAGNOSIS — N400 Enlarged prostate without lower urinary tract symptoms: Secondary | ICD-10-CM | POA: Diagnosis not present

## 2023-02-24 DIAGNOSIS — M170 Bilateral primary osteoarthritis of knee: Secondary | ICD-10-CM | POA: Diagnosis not present

## 2023-02-24 DIAGNOSIS — H269 Unspecified cataract: Secondary | ICD-10-CM | POA: Diagnosis not present

## 2023-02-24 DIAGNOSIS — I110 Hypertensive heart disease with heart failure: Secondary | ICD-10-CM | POA: Diagnosis not present

## 2023-02-24 DIAGNOSIS — K219 Gastro-esophageal reflux disease without esophagitis: Secondary | ICD-10-CM | POA: Diagnosis not present

## 2023-02-25 DIAGNOSIS — N400 Enlarged prostate without lower urinary tract symptoms: Secondary | ICD-10-CM | POA: Diagnosis not present

## 2023-02-25 DIAGNOSIS — M170 Bilateral primary osteoarthritis of knee: Secondary | ICD-10-CM | POA: Diagnosis not present

## 2023-02-25 DIAGNOSIS — H409 Unspecified glaucoma: Secondary | ICD-10-CM | POA: Diagnosis not present

## 2023-02-25 DIAGNOSIS — H269 Unspecified cataract: Secondary | ICD-10-CM | POA: Diagnosis not present

## 2023-02-25 DIAGNOSIS — S12031D Nondisplaced posterior arch fracture of first cervical vertebra, subsequent encounter for fracture with routine healing: Secondary | ICD-10-CM | POA: Diagnosis not present

## 2023-02-25 DIAGNOSIS — I110 Hypertensive heart disease with heart failure: Secondary | ICD-10-CM | POA: Diagnosis not present

## 2023-02-25 DIAGNOSIS — J449 Chronic obstructive pulmonary disease, unspecified: Secondary | ICD-10-CM | POA: Diagnosis not present

## 2023-02-25 DIAGNOSIS — I509 Heart failure, unspecified: Secondary | ICD-10-CM | POA: Diagnosis not present

## 2023-02-25 DIAGNOSIS — K219 Gastro-esophageal reflux disease without esophagitis: Secondary | ICD-10-CM | POA: Diagnosis not present

## 2023-02-27 ENCOUNTER — Ambulatory Visit: Payer: Medicare HMO | Admitting: Urology

## 2023-02-28 DIAGNOSIS — C61 Malignant neoplasm of prostate: Secondary | ICD-10-CM | POA: Diagnosis not present

## 2023-02-28 DIAGNOSIS — C775 Secondary and unspecified malignant neoplasm of intrapelvic lymph nodes: Secondary | ICD-10-CM | POA: Diagnosis not present

## 2023-03-03 DIAGNOSIS — K219 Gastro-esophageal reflux disease without esophagitis: Secondary | ICD-10-CM | POA: Diagnosis not present

## 2023-03-03 DIAGNOSIS — N400 Enlarged prostate without lower urinary tract symptoms: Secondary | ICD-10-CM | POA: Diagnosis not present

## 2023-03-03 DIAGNOSIS — M170 Bilateral primary osteoarthritis of knee: Secondary | ICD-10-CM | POA: Diagnosis not present

## 2023-03-03 DIAGNOSIS — H269 Unspecified cataract: Secondary | ICD-10-CM | POA: Diagnosis not present

## 2023-03-03 DIAGNOSIS — S12031D Nondisplaced posterior arch fracture of first cervical vertebra, subsequent encounter for fracture with routine healing: Secondary | ICD-10-CM | POA: Diagnosis not present

## 2023-03-03 DIAGNOSIS — I509 Heart failure, unspecified: Secondary | ICD-10-CM | POA: Diagnosis not present

## 2023-03-03 DIAGNOSIS — J449 Chronic obstructive pulmonary disease, unspecified: Secondary | ICD-10-CM | POA: Diagnosis not present

## 2023-03-03 DIAGNOSIS — I110 Hypertensive heart disease with heart failure: Secondary | ICD-10-CM | POA: Diagnosis not present

## 2023-03-03 DIAGNOSIS — H409 Unspecified glaucoma: Secondary | ICD-10-CM | POA: Diagnosis not present

## 2023-03-10 DIAGNOSIS — K219 Gastro-esophageal reflux disease without esophagitis: Secondary | ICD-10-CM | POA: Diagnosis not present

## 2023-03-10 DIAGNOSIS — S12031D Nondisplaced posterior arch fracture of first cervical vertebra, subsequent encounter for fracture with routine healing: Secondary | ICD-10-CM | POA: Diagnosis not present

## 2023-03-10 DIAGNOSIS — I110 Hypertensive heart disease with heart failure: Secondary | ICD-10-CM | POA: Diagnosis not present

## 2023-03-10 DIAGNOSIS — M170 Bilateral primary osteoarthritis of knee: Secondary | ICD-10-CM | POA: Diagnosis not present

## 2023-03-10 DIAGNOSIS — H409 Unspecified glaucoma: Secondary | ICD-10-CM | POA: Diagnosis not present

## 2023-03-10 DIAGNOSIS — J449 Chronic obstructive pulmonary disease, unspecified: Secondary | ICD-10-CM | POA: Diagnosis not present

## 2023-03-10 DIAGNOSIS — N400 Enlarged prostate without lower urinary tract symptoms: Secondary | ICD-10-CM | POA: Diagnosis not present

## 2023-03-10 DIAGNOSIS — I509 Heart failure, unspecified: Secondary | ICD-10-CM | POA: Diagnosis not present

## 2023-03-10 DIAGNOSIS — H269 Unspecified cataract: Secondary | ICD-10-CM | POA: Diagnosis not present

## 2023-03-17 DIAGNOSIS — I110 Hypertensive heart disease with heart failure: Secondary | ICD-10-CM | POA: Diagnosis not present

## 2023-03-17 DIAGNOSIS — I509 Heart failure, unspecified: Secondary | ICD-10-CM | POA: Diagnosis not present

## 2023-03-17 DIAGNOSIS — H409 Unspecified glaucoma: Secondary | ICD-10-CM | POA: Diagnosis not present

## 2023-03-17 DIAGNOSIS — K219 Gastro-esophageal reflux disease without esophagitis: Secondary | ICD-10-CM | POA: Diagnosis not present

## 2023-03-17 DIAGNOSIS — N400 Enlarged prostate without lower urinary tract symptoms: Secondary | ICD-10-CM | POA: Diagnosis not present

## 2023-03-17 DIAGNOSIS — M170 Bilateral primary osteoarthritis of knee: Secondary | ICD-10-CM | POA: Diagnosis not present

## 2023-03-17 DIAGNOSIS — J449 Chronic obstructive pulmonary disease, unspecified: Secondary | ICD-10-CM | POA: Diagnosis not present

## 2023-03-17 DIAGNOSIS — H269 Unspecified cataract: Secondary | ICD-10-CM | POA: Diagnosis not present

## 2023-03-17 DIAGNOSIS — S12031D Nondisplaced posterior arch fracture of first cervical vertebra, subsequent encounter for fracture with routine healing: Secondary | ICD-10-CM | POA: Diagnosis not present

## 2023-03-18 DIAGNOSIS — C61 Malignant neoplasm of prostate: Secondary | ICD-10-CM | POA: Diagnosis not present

## 2023-04-12 ENCOUNTER — Emergency Department (HOSPITAL_COMMUNITY): Payer: Medicare HMO

## 2023-04-12 ENCOUNTER — Other Ambulatory Visit: Payer: Self-pay

## 2023-04-12 ENCOUNTER — Observation Stay (HOSPITAL_COMMUNITY)
Admission: EM | Admit: 2023-04-12 | Discharge: 2023-04-14 | Disposition: A | Discharge status: Skilled Nursing Facility | Payer: Medicare HMO | Attending: Internal Medicine | Admitting: Internal Medicine

## 2023-04-12 ENCOUNTER — Encounter (HOSPITAL_COMMUNITY): Payer: Self-pay | Admitting: Emergency Medicine

## 2023-04-12 DIAGNOSIS — R262 Difficulty in walking, not elsewhere classified: Secondary | ICD-10-CM | POA: Diagnosis present

## 2023-04-12 DIAGNOSIS — E66811 Obesity, class 1: Secondary | ICD-10-CM | POA: Insufficient documentation

## 2023-04-12 DIAGNOSIS — R531 Weakness: Secondary | ICD-10-CM | POA: Diagnosis not present

## 2023-04-12 DIAGNOSIS — R600 Localized edema: Secondary | ICD-10-CM | POA: Insufficient documentation

## 2023-04-12 DIAGNOSIS — R2240 Localized swelling, mass and lump, unspecified lower limb: Secondary | ICD-10-CM | POA: Diagnosis not present

## 2023-04-12 DIAGNOSIS — R296 Repeated falls: Secondary | ICD-10-CM | POA: Diagnosis not present

## 2023-04-12 DIAGNOSIS — R54 Age-related physical debility: Secondary | ICD-10-CM | POA: Insufficient documentation

## 2023-04-12 DIAGNOSIS — J449 Chronic obstructive pulmonary disease, unspecified: Secondary | ICD-10-CM | POA: Diagnosis not present

## 2023-04-12 DIAGNOSIS — M7989 Other specified soft tissue disorders: Secondary | ICD-10-CM | POA: Diagnosis not present

## 2023-04-12 DIAGNOSIS — J310 Chronic rhinitis: Secondary | ICD-10-CM | POA: Insufficient documentation

## 2023-04-12 DIAGNOSIS — H409 Unspecified glaucoma: Secondary | ICD-10-CM | POA: Diagnosis not present

## 2023-04-12 DIAGNOSIS — Z1152 Encounter for screening for COVID-19: Secondary | ICD-10-CM | POA: Diagnosis not present

## 2023-04-12 DIAGNOSIS — Z79899 Other long term (current) drug therapy: Secondary | ICD-10-CM | POA: Insufficient documentation

## 2023-04-12 DIAGNOSIS — Z87891 Personal history of nicotine dependence: Secondary | ICD-10-CM | POA: Insufficient documentation

## 2023-04-12 DIAGNOSIS — Z6832 Body mass index (BMI) 32.0-32.9, adult: Secondary | ICD-10-CM | POA: Insufficient documentation

## 2023-04-12 DIAGNOSIS — E785 Hyperlipidemia, unspecified: Secondary | ICD-10-CM | POA: Diagnosis not present

## 2023-04-12 DIAGNOSIS — I10 Essential (primary) hypertension: Secondary | ICD-10-CM | POA: Insufficient documentation

## 2023-04-12 DIAGNOSIS — I77810 Thoracic aortic ectasia: Secondary | ICD-10-CM | POA: Diagnosis not present

## 2023-04-12 DIAGNOSIS — R0602 Shortness of breath: Secondary | ICD-10-CM | POA: Diagnosis not present

## 2023-04-12 DIAGNOSIS — R2689 Other abnormalities of gait and mobility: Principal | ICD-10-CM | POA: Insufficient documentation

## 2023-04-12 LAB — CBC WITH DIFFERENTIAL/PLATELET
Abs Immature Granulocytes: 0.02 10*3/uL (ref 0.00–0.07)
Basophils Absolute: 0 10*3/uL (ref 0.0–0.1)
Basophils Relative: 0 %
Eosinophils Absolute: 0.1 10*3/uL (ref 0.0–0.5)
Eosinophils Relative: 2 %
HCT: 38.1 % — ABNORMAL LOW (ref 39.0–52.0)
Hemoglobin: 11.8 g/dL — ABNORMAL LOW (ref 13.0–17.0)
Immature Granulocytes: 0 %
Lymphocytes Relative: 12 %
Lymphs Abs: 0.8 10*3/uL (ref 0.7–4.0)
MCH: 26.2 pg (ref 26.0–34.0)
MCHC: 31 g/dL (ref 30.0–36.0)
MCV: 84.5 fL (ref 80.0–100.0)
Monocytes Absolute: 0.9 10*3/uL (ref 0.1–1.0)
Monocytes Relative: 13 %
Neutro Abs: 4.9 10*3/uL (ref 1.7–7.7)
Neutrophils Relative %: 73 %
Platelets: 255 10*3/uL (ref 150–400)
RBC: 4.51 MIL/uL (ref 4.22–5.81)
RDW: 19.5 % — ABNORMAL HIGH (ref 11.5–15.5)
WBC: 6.7 10*3/uL (ref 4.0–10.5)
nRBC: 0 % (ref 0.0–0.2)

## 2023-04-12 LAB — URINALYSIS, ROUTINE W REFLEX MICROSCOPIC
Bilirubin Urine: NEGATIVE
Glucose, UA: NEGATIVE mg/dL
Ketones, ur: NEGATIVE mg/dL
Leukocytes,Ua: NEGATIVE
Nitrite: NEGATIVE
Protein, ur: NEGATIVE mg/dL
Specific Gravity, Urine: 1.015 (ref 1.005–1.030)
pH: 5 (ref 5.0–8.0)

## 2023-04-12 LAB — COMPREHENSIVE METABOLIC PANEL
ALT: 16 U/L (ref 0–44)
AST: 15 U/L (ref 15–41)
Albumin: 3.2 g/dL — ABNORMAL LOW (ref 3.5–5.0)
Alkaline Phosphatase: 60 U/L (ref 38–126)
Anion gap: 4 — ABNORMAL LOW (ref 5–15)
BUN: 19 mg/dL (ref 8–23)
CO2: 25 mmol/L (ref 22–32)
Calcium: 9.2 mg/dL (ref 8.9–10.3)
Chloride: 109 mmol/L (ref 98–111)
Creatinine, Ser: 1.01 mg/dL (ref 0.61–1.24)
GFR, Estimated: >60 mL/min (ref >60)
Glucose, Bld: 103 mg/dL — ABNORMAL HIGH (ref 70–99)
Potassium: 3.9 mmol/L (ref 3.5–5.1)
Sodium: 138 mmol/L (ref 135–145)
Total Bilirubin: 0.4 mg/dL (ref 0.0–1.2)
Total Protein: 7.1 g/dL (ref 6.5–8.1)

## 2023-04-12 LAB — SARS CORONAVIRUS 2 BY RT PCR: SARS Coronavirus 2 by RT PCR: NEGATIVE

## 2023-04-12 LAB — BRAIN NATRIURETIC PEPTIDE: B Natriuretic Peptide: 58 pg/mL (ref 0.0–100.0)

## 2023-04-12 MED ORDER — HEPARIN SODIUM (PORCINE) 5000 UNIT/ML IJ SOLN
5000.0000 [IU] | Freq: Three times a day (TID) | INTRAMUSCULAR | Status: DC
  Administered 2023-04-12 – 2023-04-14 (×6): 5000 [IU] via SUBCUTANEOUS
  Filled 2023-04-12 (×6): qty 1

## 2023-04-12 MED ORDER — LATANOPROST 0.005 % OP SOLN
1.0000 [drp] | Freq: Every day | OPHTHALMIC | Status: DC
  Administered 2023-04-13 (×2): 1 [drp] via OPHTHALMIC
  Filled 2023-04-12: qty 2.5

## 2023-04-12 MED ORDER — VITAMIN D 25 MCG (1000 UNIT) PO TABS
1000.0000 [IU] | ORAL_TABLET | Freq: Every day | ORAL | Status: DC
  Administered 2023-04-13 – 2023-04-14 (×2): 1000 [IU] via ORAL
  Filled 2023-04-12 (×2): qty 1

## 2023-04-12 MED ORDER — KETOROLAC TROMETHAMINE 15 MG/ML IJ SOLN
15.0000 mg | Freq: Once | INTRAMUSCULAR | Status: AC
  Administered 2023-04-13: 15 mg via INTRAVENOUS
  Filled 2023-04-12: qty 1

## 2023-04-12 MED ORDER — ACETAMINOPHEN 650 MG RE SUPP
650.0000 mg | Freq: Four times a day (QID) | RECTAL | Status: DC | PRN
  Administered 2023-04-14: 650 mg via RECTAL

## 2023-04-12 MED ORDER — ALBUTEROL SULFATE (2.5 MG/3ML) 0.083% IN NEBU: 2.5000 mg | INHALATION_SOLUTION | Freq: Four times a day (QID) | RESPIRATORY_TRACT | Status: DC | PRN

## 2023-04-12 MED ORDER — TIOTROPIUM BROMIDE MONOHYDRATE 18 MCG IN CAPS: 18.0000 ug | ORAL_CAPSULE | Freq: Every day | RESPIRATORY_TRACT | Status: DC

## 2023-04-12 MED ORDER — OMEGA-3-ACID ETHYL ESTERS 1 G PO CAPS
1.0000 g | ORAL_CAPSULE | Freq: Two times a day (BID) | ORAL | Status: DC
  Administered 2023-04-13 – 2023-04-14 (×4): 1 g via ORAL
  Filled 2023-04-12 (×4): qty 1

## 2023-04-12 MED ORDER — B COMPLEX VITAMINS PO CAPS: 1.0000 | ORAL_CAPSULE | Freq: Every day | ORAL | Status: DC

## 2023-04-12 MED ORDER — B COMPLEX-C PO TABS
1.0000 | ORAL_TABLET | Freq: Every day | ORAL | Status: DC
  Administered 2023-04-13 – 2023-04-14 (×2): 1 via ORAL
  Filled 2023-04-12 (×2): qty 1

## 2023-04-12 MED ORDER — FUROSEMIDE 40 MG PO TABS
40.0000 mg | ORAL_TABLET | Freq: Every day | ORAL | Status: DC
  Administered 2023-04-12 – 2023-04-14 (×3): 40 mg via ORAL
  Filled 2023-04-12 (×3): qty 1

## 2023-04-12 MED ORDER — ACETAMINOPHEN 325 MG PO TABS
650.0000 mg | ORAL_TABLET | Freq: Four times a day (QID) | ORAL | Status: DC | PRN
  Administered 2023-04-13 (×2): 650 mg via ORAL
  Filled 2023-04-12 (×3): qty 2

## 2023-04-12 NOTE — H&P (Signed)
TRH H&P   Patient Demographics:    Randy Russo, is a 83 y.o. male  MRN: [id number]   DOB - December 11, 1940  Admit Date - 04/12/2023  Outpatient Primary MD for the patient is Assunta Found, MD  Referring MD/NP/PA: Dr. Estell Harpin  Patient coming from: Lives at a motel  Chief Complaint  Patient presents with   Leg Swelling    Pt called EMS out for leg swelling for last 4 days, concerned about reasons why, patient has complaints of body aches as well      HPI:    Randy Russo  is a 83 y.o. male, with past medical history of frailty, deconditioning, GI bleed due to diverticulosis, COPD, hypertension, arthritis and recurrent falls . -Patient with recent hospitalization at [facility] in [place], last September, for UTI, falls, fracture of C-spine managed conservatively with [facility], , was discharged to SNF, and apparently was discharged home, where he stayed with his granddaughter, but apparently has been in agreement with her, as she is trying to obtain healthcare power of attorney and to proceed with placement given his increased debility, deconditioning and falls, for which he was not been happy with, and decided to leave where he has been living in a motel for last couple days, nightly patient still driving, and his car broke down, he presents to ED secondary to complaints of worsening lower extremity edema, and generalized body ache, otherwise he denies any specific complaints. -In ED there was no significant labs abnormalities, chest x-ray with no acute findings, vital signs overall stable, but given his frailty and deconditioning.  Hospitalist consulted to admit.   Review of systems:      A full 10 point Review of Systems was done, except as stated above, all other Review of Systems were negative.   With Past History of the following :    Past Medical History:  Diagnosis Date    Arthritis    Cancer (HCC)    Prostate   COPD (chronic obstructive pulmonary disease) (HCC)    Diverticulitis    Hypertension    Lower GI bleed 12/27/2017      Past Surgical History:  Procedure Laterality Date   CATARACT EXTRACTION W/PHACO Left 11/16/2012   Procedure: CATARACT EXTRACTION PHACO AND INTRAOCULAR LENS PLACEMENT (IOC);  Surgeon: Gemma Payor, MD;  Location: [facility];  Service: Ophthalmology;  Laterality: Left;  CDE: 13.66   CATARACT EXTRACTION W/PHACO Right 01/18/2013   Procedure: CATARACT EXTRACTION PHACO AND INTRAOCULAR LENS PLACEMENT (IOC);  Surgeon: Gemma Payor, MD;  Location: [facility];  Service: Ophthalmology;  Laterality: Right;  CDE:8.79   CERVICAL FUSION  4 yrs ago   COLONOSCOPY N/A 12/28/2017   Procedure: COLONOSCOPY;  Surgeon: Malissa Hippo, MD;  Location: [facility];  Service: Endoscopy;  Laterality: N/A;   COLONOSCOPY N/A 01/01/2018   Procedure: COLONOSCOPY;  Surgeon: Malissa Hippo, MD;  Location: [facility]  ENDO SUITE;  Service: Endoscopy;  Laterality: N/A;   ESOPHAGOGASTRODUODENOSCOPY N/A 01/01/2018   Procedure: ESOPHAGOGASTRODUODENOSCOPY (EGD);  Surgeon: Malissa Hippo, MD;  Location: [facility];  Service: Endoscopy;  Laterality: N/A;   INGUINAL HERNIA REPAIR Right 03/12/2021   Procedure: HERNIA REPAIR INGUINAL ADULT;  Surgeon: Franky Macho, MD;  Location: [facility];  Service: General;  Laterality: Right;   JOINT REPLACEMENT Left 5 yrs ago   knee   PARTIAL COLECTOMY N/A 01/01/2018   Procedure: PARTIAL COLECTOMY;  Surgeon: Franky Macho, MD;  Location: [facility];  Service: General;  Laterality: N/A;   right hip replacement Right 3 yrs ago      Social History:     Social History   Tobacco Use   Smoking status: Former    Types: Cigars   Smokeless tobacco: Never  Substance Use Topics   Alcohol use: No        Family History :     Family History  Problem Relation Age of Onset   Diabetes Mother    Dementia Father      Home Medications:   Prior to  Admission medications   Medication Sig Start Date End Date Taking? Authorizing Provider  acetaminophen (TYLENOL) 500 MG tablet Take 1,000 mg by mouth every 6 (six) hours as needed for mild pain.    [provider]  albuterol (PROVENTIL) (2.5 MG/3ML) 0.083% nebulizer solution Take 2.5 mg by nebulization every 6 (six) hours as needed for wheezing or shortness of breath.    [provider]  albuterol (VENTOLIN HFA) 108 (90 Base) MCG/ACT inhaler Inhale 1-2 puffs into the lungs every 6 (six) hours as needed for wheezing or shortness of breath.    [provider]  b complex vitamins capsule Take 1 capsule by mouth daily.    [provider]  cephALEXin (KEFLEX) 500 MG capsule Take 1 capsule (500 mg total) by mouth 4 (four) times daily. Patient not taking: Reported on 12/12/2021 11/05/21   Gloris Manchester, MD  Cholecalciferol 25 MCG (1000 UT) capsule Take 1,000 Units by mouth daily.    [provider]  LUMIGAN 0.01 % SOLN Place 1 drop into both eyes at bedtime. 11/25/21   [provider]  Omega-3 Fatty Acids (FISH OIL) 1200 MG CAPS Take 1,200 mg by mouth daily.    [provider]  SPIRIVA HANDIHALER 18 MCG inhalation capsule Place 18 mcg into inhaler and inhale daily. 04/13/19   [provider]     Allergies:    No Known Allergies   Physical Exam:   Vitals  Blood pressure 105/60, pulse 93, temperature 99.3 F (37.4 C), temperature source Oral, resp. rate (!) 21, SpO2 90%.   1. General Frail elderly male, laying in bed, no apparent distress, extremely frail and deconditioned  2.  Is hard of hearing, questions appropriately,  Awake Alert, Oriented X 3.  3. No F.N deficits, ALL C.Nerves Intact, Strength 5/5 all 4 extremities, Sensation intact all 4 extremities, Plantars down going.  4. Ears and Eyes appear Normal, Conjunctivae clear, PERRLA. Moist Oral Mucosa.  5. Supple Neck, No JVD, No cervical lymphadenopathy appriciated, No  Carotid Bruits.  6. Symmetrical Chest wall movement, Good air movement bilaterally, CTAB.  7. RRR, No Gallops, Rubs or Murmurs, No Parasternal Heave.  +2 lower extremity edema, appears chronic  8. Positive Bowel Sounds, Abdomen Soft, No tenderness, No organomegaly appriciated,No rebound -guarding or rigidity.  9.  No Cyanosis, Normal Skin Turgor, No Skin Rash or Bruise.  10. Good muscle tone,  joints appear normal , no effusions, Normal ROM.     Data Review:    CBC Recent Labs  Lab 04/12/23 1822  WBC 6.7  HGB 11.8*  HCT 38.1*  PLT 255  MCV 84.5  MCH 26.2  MCHC 31.0  RDW 19.5*  LYMPHSABS 0.8  MONOABS 0.9  EOSABS 0.1  BASOSABS 0.0   ------------------------------------------------------------------------------------------------------------------  Chemistries  Recent Labs  Lab 04/12/23 1822  NA 138  K 3.9  CL 109  CO2 25  GLUCOSE 103*  BUN 19  CREATININE 1.01  CALCIUM 9.2  AST 15  ALT 16  ALKPHOS 60  BILITOT 0.4   ------------------------------------------------------------------------------------------------------------------ CrCl cannot be calculated (Unknown ideal weight.). ------------------------------------------------------------------------------------------------------------------ No results for input(s): "TSH", "T4TOTAL", "T3FREE", "THYROIDAB" in the last 72 hours.  Invalid input(s): "FREET3"  Coagulation profile No results for input(s): "INR", "PROTIME" in the last 168 hours. ------------------------------------------------------------------------------------------------------------------- No results for input(s): "DDIMER" in the last 72 hours. -------------------------------------------------------------------------------------------------------------------  Cardiac Enzymes No results for input(s): "CKMB", "TROPONINI", "MYOGLOBIN" in the last 168 hours.  Invalid input(s):  "CK" ------------------------------------------------------------------------------------------------------------------    Component Value Date/Time   BNP 58.0 04/12/2023 1822     ---------------------------------------------------------------------------------------------------------------  Urinalysis    Component Value Date/Time   COLORURINE YELLOW 11/05/2021 1528   APPEARANCEUR HAZY (A) 11/05/2021 1528   APPEARANCEUR Clear 12/28/2020 1408   LABSPEC 1.013 11/05/2021 1528   PHURINE 7.0 11/05/2021 1528   GLUCOSEU NEGATIVE 11/05/2021 1528   HGBUR SMALL (A) 11/05/2021 1528   BILIRUBINUR NEGATIVE 11/05/2021 1528   BILIRUBINUR Negative 12/28/2020 1408   KETONESUR NEGATIVE 11/05/2021 1528   PROTEINUR NEGATIVE 11/05/2021 1528   UROBILINOGEN 0.2 09/10/2019 1535   UROBILINOGEN 0.2 09/13/2010 0850   NITRITE NEGATIVE 11/05/2021 1528   LEUKOCYTESUR NEGATIVE 11/05/2021 1528    ----------------------------------------------------------------------------------------------------------------   Imaging Results:    DG Chest Port 1 View Result Date: 04/12/2023 CLINICAL DATA:  Short of breath, lower extremity edema EXAM: PORTABLE CHEST 1 VIEW COMPARISON:  11/29/2022 FINDINGS: Single frontal view of the chest demonstrates a stable cardiac silhouette. Continued ectasia of the thoracic aorta. No airspace disease, effusion, or pneumothorax. No acute bony abnormality. IMPRESSION: 1. No acute intrathoracic process. Electronically Signed   By: Sharlet Salina M.D.   On: 04/12/2023 18:43      Assessment & Plan:    Principal Problem:   Ambulatory dysfunction Active Problems:   COPD (chronic obstructive pulmonary disease) (HCC)   Essential hypertension    Ambulatory  dysfunction Age-related physical debility Falls -Was living with his granddaughter, but recently moved out to motel as he has been disagreeing with her about need for long-term placement -Report couple falls over last day or so,  reports unsteady gait, so he will be admitted, for PT-OT evaluation, and likely will need SNF placement -Reports generalized body ache, will check flu panel -Check urinalysis  COPD -no active wheezing, no oxygen requirement, continue with her medications and as needed albuterol   Lower extremity edema -Appears to be chronic, continue with home dose Lasix, BNP within normal limit, no evidence of volume overload  DVT Prophylaxis Heparin   AM Labs Ordered, also please review Full Orders  Family Communication: Admission, patients condition and plan of care including tests being ordered have been discussed with the patient and  who indicate understanding and agree with the plan and Code Status.  Code Status full code  Likely DC to likely will need SNF  Consults called: None  Admission status: Observation  Time spent in  minutes : 50 minutes   Huey Bienenstock M.D on 04/12/2023 at 9:22 PM   [facility] - Office  [phone]

## 2023-04-12 NOTE — ED Notes (Signed)
Grand daughter, Randy Russo, states "my grandfather has been living with me for the past month and I have had a talk with him about nursing homes and him needing more care and about paperwork for  power of attorney but he then moved out and refused to sign paperwork and him not wanting to go to a nursing home so he has been staying at a hotel in [place] and I am concerned for him because he uses a wheelchair and I think he has dementia and he just dont need to be on his own" Mrs. Randy Russo would like updates on care if possible

## 2023-04-12 NOTE — ED Provider Notes (Signed)
[place] EMERGENCY DEPARTMENT AT [facility] Provider Note   CSN: [id number] Arrival date & time: 04/12/23  1741     History {Add pertinent medical, surgical, social history, OB history to HPI:1} Chief Complaint  Patient presents with   Leg Swelling    Pt called EMS out for leg swelling for last 4 days, concerned about reasons why, patient has complaints of body aches as well    Randy Russo is a 83 y.o. male.  Patient with history of COPD.  He complains of weakness and swelling of the legs   Weakness      Home Medications Prior to Admission medications   Medication Sig Start Date End Date Taking? Authorizing Provider  acetaminophen (TYLENOL) 500 MG tablet Take 1,000 mg by mouth every 6 (six) hours as needed for mild pain.    [provider]  albuterol (PROVENTIL) (2.5 MG/3ML) 0.083% nebulizer solution Take 2.5 mg by nebulization every 6 (six) hours as needed for wheezing or shortness of breath.    [provider]  albuterol (VENTOLIN HFA) 108 (90 Base) MCG/ACT inhaler Inhale 1-2 puffs into the lungs every 6 (six) hours as needed for wheezing or shortness of breath.    [provider]  b complex vitamins capsule Take 1 capsule by mouth daily.    [provider]  cephALEXin (KEFLEX) 500 MG capsule Take 1 capsule (500 mg total) by mouth 4 (four) times daily. Patient not taking: Reported on 12/12/2021 11/05/21   Gloris Manchester, MD  Cholecalciferol 25 MCG (1000 UT) capsule Take 1,000 Units by mouth daily.    [provider]  LUMIGAN 0.01 % SOLN Place 1 drop into both eyes at bedtime. 11/25/21   [provider]  Omega-3 Fatty Acids (FISH OIL) 1200 MG CAPS Take 1,200 mg by mouth daily.    [provider]  SPIRIVA HANDIHALER 18 MCG inhalation capsule Place 18 mcg into inhaler and inhale daily. 04/13/19   [provider]      Allergies    Patient has no known allergies.    Review of Systems   Review of  Systems  Neurological:  Positive for weakness.    Physical Exam Updated Vital Signs BP 105/60   Pulse 93   Temp 99.3 F (37.4 C) (Oral)   Resp (!) 21   SpO2 90%  Physical Exam  ED Results / Procedures / Treatments   Labs (all labs ordered are listed, but only abnormal results are displayed) Labs Reviewed  CBC WITH DIFFERENTIAL/PLATELET - Abnormal; Notable for the following components:      Result Value   Hemoglobin 11.8 (*)    HCT 38.1 (*)    RDW 19.5 (*)    All other components within normal limits  COMPREHENSIVE METABOLIC PANEL - Abnormal; Notable for the following components:   Glucose, Bld 103 (*)    Albumin 3.2 (*)    Anion gap 4 (*)    All other components within normal limits  BRAIN NATRIURETIC PEPTIDE    EKG EKG Interpretation Date/Time:  Saturday April 12 2023 18:38:27 EST Ventricular Rate:  85 PR Interval:  204 QRS Duration:  88 QT Interval:  365 QTC Calculation: 434 R Axis:   21  Text Interpretation: Sinus rhythm Posterior infarct, old Confirmed by Bethann Berkshire [phone]) on 04/12/2023 7:57:24 PM  Radiology DG Chest Port 1 View Result Date: 04/12/2023 CLINICAL DATA:  Short of breath, lower extremity edema EXAM: PORTABLE CHEST 1 VIEW COMPARISON:  11/29/2022 FINDINGS:  Single frontal view of the chest demonstrates a stable cardiac silhouette. Continued ectasia of the thoracic aorta. No airspace disease, effusion, or pneumothorax. No acute bony abnormality. IMPRESSION: 1. No acute intrathoracic process. Electronically Signed   By: Sharlet Salina M.D.   On: 04/12/2023 18:43    Procedures Procedures  {Document cardiac monitor, telemetry assessment procedure when appropriate:1}  Medications Ordered in ED Medications - No data to display  ED Course/ Medical Decision Making/ A&P   {   Click here for ABCD2, HEART and other calculatorsREFRESH Note before signing :1}                              Medical Decision Making Amount and/or Complexity of Data  Reviewed Labs: ordered. Radiology: ordered. ECG/medicine tests: ordered.  Risk Decision regarding hospitalization.   Will be admitted for anasarca and failure to thrive  {Document critical care time when appropriate:1} {Document review of labs and clinical decision tools ie heart score, Chads2Vasc2 etc:1}  {Document your independent review of radiology images, and any outside records:1} {Document your discussion with family members, caretakers, and with consultants:1} {Document social determinants of health affecting pt's care:1} {Document your decision making why or why not admission, treatments were needed:1} Final Clinical Impression(s) / ED Diagnoses Final diagnoses:  None    Rx / DC Orders ED Discharge Orders     None

## 2023-04-13 DIAGNOSIS — R262 Difficulty in walking, not elsewhere classified: Secondary | ICD-10-CM

## 2023-04-13 DIAGNOSIS — J449 Chronic obstructive pulmonary disease, unspecified: Secondary | ICD-10-CM

## 2023-04-13 DIAGNOSIS — I10 Essential (primary) hypertension: Secondary | ICD-10-CM

## 2023-04-13 LAB — CBC
HCT: 38.6 % — ABNORMAL LOW (ref 39.0–52.0)
Hemoglobin: 11.8 g/dL — ABNORMAL LOW (ref 13.0–17.0)
MCH: 25.7 pg — ABNORMAL LOW (ref 26.0–34.0)
MCHC: 30.6 g/dL (ref 30.0–36.0)
MCV: 84.1 fL (ref 80.0–100.0)
Platelets: 269 10*3/uL (ref 150–400)
RBC: 4.59 MIL/uL (ref 4.22–5.81)
RDW: 19.4 % — ABNORMAL HIGH (ref 11.5–15.5)
WBC: 6.3 10*3/uL (ref 4.0–10.5)
nRBC: 0 % (ref 0.0–0.2)

## 2023-04-13 LAB — BASIC METABOLIC PANEL
Anion gap: 6 (ref 5–15)
BUN: 20 mg/dL (ref 8–23)
CO2: 24 mmol/L (ref 22–32)
Calcium: 9.2 mg/dL (ref 8.9–10.3)
Chloride: 108 mmol/L (ref 98–111)
Creatinine, Ser: 0.93 mg/dL (ref 0.61–1.24)
GFR, Estimated: >60 mL/min (ref >60)
Glucose, Bld: 93 mg/dL (ref 70–99)
Potassium: 3.8 mmol/L (ref 3.5–5.1)
Sodium: 138 mmol/L (ref 135–145)

## 2023-04-13 MED ORDER — LORATADINE 10 MG PO TABS
10.0000 mg | ORAL_TABLET | Freq: Every day | ORAL | Status: DC
Start: 2023-04-13 — End: 2023-04-14
  Administered 2023-04-13 – 2023-04-14 (×2): 10 mg via ORAL
  Filled 2023-04-13 (×2): qty 1

## 2023-04-13 MED ORDER — UMECLIDINIUM BROMIDE 62.5 MCG/ACT IN AEPB
1.0000 | INHALATION_SPRAY | Freq: Every day | RESPIRATORY_TRACT | Status: DC
  Administered 2023-04-13 – 2023-04-14 (×2): 1 via RESPIRATORY_TRACT
  Filled 2023-04-13: qty 7

## 2023-04-13 NOTE — Care Management Obs Status (Signed)
MEDICARE OBSERVATION STATUS NOTIFICATION   Patient Details  Name: Randy Russo MRN: [id number] Date of Birth: [birth year]   Medicare Observation Status Notification Given:  Yes    I [name]., LCSWA verbally reviewed observation notice, signature provided by Wendie Agreste, LCSWA 04/13/2023, 12:25 PM

## 2023-04-13 NOTE — Evaluation (Signed)
Physical Therapy Evaluation Patient Details Name: Randy Russo MRN: [id number] DOB: [birth year] Today's Date: 04/13/2023  History of Present Illness  [name]  is a 83 y.o. male, with past medical history of frailty, deconditioning, GI bleed due to diverticulosis, COPD, hypertension, arthritis and recurrent falls .  -Patient with recent hospitalization at [facility] in [place], last September, for UTI, falls, fracture of C-spine managed conservatively with [facility], , was discharged to SNF, and apparently was discharged home, where he stayed with his granddaughter, but apparently has been in agreement with her, as she is trying to obtain healthcare power of attorney and to proceed with placement given his increased debility, deconditioning and falls, for which he was not been happy with, and decided to leave where he has been living in a motel for last couple days, nightly patient still driving, and his car broke down, he presents to ED secondary to complaints of worsening lower extremity edema, and generalized body ache, otherwise he denies any specific complaints.   Clinical Impression  Patient demonstrates fair/good return for bed mobility, very unsteady on feet, limited to a few side steps before having to sit due to weakness and fatigue and tolerated staying up in chair after therapy. Patient will benefit from continued skilled physical therapy in hospital and recommended venue below to increase strength, balance, endurance for safe ADLs and gait.       If plan is discharge home, recommend the following: A lot of help with walking and/or transfers;A little help with bathing/dressing/bathroom;Help with stairs or ramp for entrance;Assistance with cooking/housework   Can travel by private vehicle   Yes    Equipment Recommendations None recommended by PT  Recommendations for Other Services       Functional Status Assessment Patient has had a recent decline in their functional status  and demonstrates the ability to make significant improvements in function in a reasonable and predictable amount of time.     Precautions / Restrictions Precautions Precautions: Fall Restrictions Weight Bearing Restrictions Per Provider Order: No      Mobility  Bed Mobility Overal bed mobility: Needs Assistance Bed Mobility: Supine to Sit, Sit to Supine     Supine to sit: Supervision Sit to supine: Supervision   General bed mobility comments: increased time, labored movement    Transfers Overall transfer level: Needs assistance Equipment used: Rolling walker (2 wheels) Transfers: Sit to/from Stand, Bed to chair/wheelchair/BSC Sit to Stand: Min assist   Step pivot transfers: Min assist       General transfer comment: unsteady, had to use armrest of chair or RW, poor tolerance for standing    Ambulation/Gait Ambulation/Gait assistance: Min assist, Mod assist Gait Distance (Feet): 3 Feet Assistive device: Rolling walker (2 wheels) Gait Pattern/deviations: Decreased step length - right, Decreased step length - left, Decreased stride length Gait velocity: slow     General Gait Details: limited to a few side steps before having to sit due to weakness  Stairs            Wheelchair Mobility     Tilt Bed    Modified Rankin (Stroke Patients Only)       Balance Overall balance assessment: Needs assistance Sitting-balance support: Feet supported, No upper extremity supported Sitting balance-Leahy Scale: Fair Sitting balance - Comments: fair/good seated at EOB   Standing balance support: During functional activity, Reliant on assistive device for balance, Bilateral upper extremity supported Standing balance-Leahy Scale: Poor Standing balance comment: using RW, leaning on armrest of  chair                             Pertinent Vitals/Pain Pain Assessment Pain Assessment: No/denies pain    Home Living Family/patient expects to be discharged  to:: Private residence Living Arrangements: Alone Available Help at Discharge: Family;Available PRN/intermittently Type of Home: House Home Access: Stairs to enter Entrance Stairs-Rails: None Entrance Stairs-Number of Steps: 1   Home Layout: One level Home Equipment: None;Rolling Walker (2 wheels);BSC/3in1 Additional Comments: Recently staying at a Motel    Prior Function Prior Level of Function : Independent/Modified Independent;Driving             Mobility Comments: uses wheelchair mostly, very short household distances using RW ADLs Comments: Indepedent, drives     Extremity/Trunk Assessment   Upper Extremity Assessment Upper Extremity Assessment: Defer to OT evaluation    Lower Extremity Assessment Lower Extremity Assessment: Generalized weakness    Cervical / Trunk Assessment Cervical / Trunk Assessment: Kyphotic  Communication   Communication Communication: No apparent difficulties  Cognition Arousal: Alert Behavior During Therapy: WFL for tasks assessed/performed Overall Cognitive Status: Within Functional Limits for tasks assessed                                          General Comments      Exercises     Assessment/Plan    PT Assessment Patient needs continued PT services  PT Problem List Decreased strength;Decreased activity tolerance;Decreased balance;Decreased mobility       PT Treatment Interventions DME instruction;Gait training;Stair training;Functional mobility training;Therapeutic activities;Therapeutic exercise;Balance training;Patient/family education    PT Goals (Current goals can be found in the Care Plan section)  Acute Rehab PT Goals Patient Stated Goal: return home PT Goal Formulation: With patient Time For Goal Achievement: 04/27/23 Potential to Achieve Goals: Good    Frequency Min 3X/week     Co-evaluation               AM-PAC PT "6 Clicks" Mobility  Outcome Measure Help needed turning from  your back to your side while in a flat bed without using bedrails?: None Help needed moving from lying on your back to sitting on the side of a flat bed without using bedrails?: A Little Help needed moving to and from a bed to a chair (including a wheelchair)?: A Little Help needed standing up from a chair using your arms (e.g., wheelchair or bedside chair)?: A Little Help needed to walk in hospital room?: A Lot Help needed climbing 3-5 steps with a railing? : A Lot 6 Click Score: 17    End of Session   Activity Tolerance: Patient tolerated treatment well;Patient limited by fatigue Patient left: in chair;with call bell/phone within reach Nurse Communication: Mobility status PT Visit Diagnosis: Unsteadiness on feet (R26.81);Other abnormalities of gait and mobility (R26.89);Muscle weakness (generalized) (M62.81)    Time: 1020-1040 PT Time Calculation (min) (ACUTE ONLY): 20 min   Charges:   PT Evaluation $PT Eval Moderate Complexity: 1 Mod PT Treatments $Therapeutic Activity: 8-22 mins PT General Charges $$ ACUTE PT VISIT: 1 Visit         1:06 PM, 04/13/23 Ocie Bob, MPT Physical Therapist with [facility] [phone] office [phone] mobile phone

## 2023-04-13 NOTE — Plan of Care (Signed)
  Problem: Acute Rehab PT Goals(only PT should resolve) Goal: Pt Will Go Supine/Side To Sit Outcome: Progressing Flowsheets (Taken 04/13/2023 1307) Pt will go Supine/Side to Sit: with modified independence Goal: Patient Will Transfer Sit To/From Stand Outcome: Progressing Flowsheets (Taken 04/13/2023 1307) Patient will transfer sit to/from stand: with contact guard assist Goal: Pt Will Transfer Bed To Chair/Chair To Bed Outcome: Progressing Flowsheets (Taken 04/13/2023 1307) Pt will Transfer Bed to Chair/Chair to Bed: with contact guard assist Goal: Pt Will Ambulate Outcome: Progressing Flowsheets (Taken 04/13/2023 1307) Pt will Ambulate:  15 feet  with minimal assist  with rolling walker   1:07 PM, 04/13/23 Ocie Bob, MPT Physical Therapist with [facility] [phone] office [phone] mobile phone

## 2023-04-13 NOTE — TOC Initial Note (Signed)
Transition of Care [facility]) - Initial/Assessment Note    Patient Details  Name: Randy Russo MRN: [id number] Date of Birth: April 08, 1940  Transition of Care [facility]) CM/SW Contact:    Villa Herb, LCSWA Phone Number: 04/13/2023, 12:27 PM  Clinical Narrative:                 CSW noted that PT is recommending SNF for pt at D/C. CSW spoke with pt at bedside to review recommendation. Pt is agreeable to SNF referral being sent out locally for review. CSW to complete referral and send out. TOC to follow.   Expected Discharge Plan: Skilled Nursing Facility Barriers to Discharge: Continued Medical Work up   Patient Goals and CMS Choice Patient states their goals for this hospitalization and ongoing recovery are:: go to SNF CMS Medicare.gov Compare Post Acute Care list provided to:: Patient Choice offered to / list presented to : Patient [place] ownership interest in [facility].provided to:: Patient    Expected Discharge Plan and Services In-house Referral: Clinical Social Work Discharge Planning Services: CM Consult Post Acute Care Choice: Skilled Nursing Facility Living arrangements for the past 2 months: Single Family Home                                      Prior Living Arrangements/Services Living arrangements for the past 2 months: Single Family Home Lives with:: Self Patient language and need for interpreter reviewed:: Yes Do you feel safe going back to the place where you live?: Yes      Need for Family Participation in Patient Care: Yes (Comment) Care giver support system in place?: Yes (comment)   Criminal Activity/Legal Involvement Pertinent to Current Situation/Hospitalization: No - Comment as needed  Activities of Daily Living   ADL Screening (condition at time of admission) Independently performs ADLs?: Yes (appropriate for developmental age) Is the patient deaf or have difficulty hearing?: No Does the patient have difficulty seeing, even when  wearing glasses/contacts?: No Does the patient have difficulty concentrating, remembering, or making decisions?: No  Permission Sought/Granted                  Emotional Assessment Appearance:: Appears stated age Attitude/Demeanor/Rapport: Engaged Affect (typically observed): Accepting Orientation: : Oriented to Self, Oriented to Place, Oriented to Situation, Oriented to  Time Alcohol / Substance Use: Not Applicable Psych Involvement: No (comment)  Admission diagnosis:  Leg swelling [M79.89] Ambulatory dysfunction [R26.2] Patient Active Problem List   Diagnosis Date Noted   Ambulatory dysfunction 04/12/2023   Acute lower GI bleeding 11/01/2021   Pressure injury of skin 11/01/2021   Right inguinal hernia    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   Rectal bleeding 11/08/2018   Colitis 11/08/2018   Odynophagia    Gastroesophageal reflux disease without esophagitis    Non-intractable vomiting    Acute blood loss anemia 01/02/2018   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    Syncope and collapse    COPD (chronic obstructive pulmonary disease) (HCC) 12/27/2017   Essential hypertension 12/27/2017   Near syncope 12/27/2017   PCP:  Assunta Found, MD Pharmacy:   [facility] [address] [place] - [address] [place] [address] Phone: [phone] Fax: [phone]     Social Drivers of Health (SDOH) Social History: SDOH Screenings   Food Insecurity: Food Insecurity Present (04/13/2023)  Housing: High  Risk (04/13/2023)  Transportation Needs: No Transportation Needs (04/13/2023)  Utilities: Not At Risk (04/13/2023)  Financial Resource Strain: Low Risk  (11/30/2022)   [facility]  Physical Activity: Unknown (12/27/2017)  Social Connections: Unknown (04/13/2023)  Tobacco Use: Medium Risk (04/12/2023)  Health Literacy: Medium Risk (11/30/2022)   Received from [facility]   SDOH Interventions: Food  Insecurity Interventions: Inpatient TOC Housing Interventions: Inpatient TOC Transportation Interventions: Intervention Not Indicated Utilities Interventions: Intervention Not Indicated Social Connections Interventions: Intervention Not Indicated   Readmission Risk Interventions     No data to display

## 2023-04-13 NOTE — NC FL2 (Signed)
[place] MEDICAID FL2 LEVEL OF CARE FORM     IDENTIFICATION  Patient Name: Randy Russo Birthdate: 04-13-1940 Sex: male Admission Date (Current Location): 04/12/2023  [facility] and [place] Number:  [employer] and Address:  [facility],  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   Rectal bleeding 11/08/2018   Colitis 11/08/2018   Odynophagia    Gastroesophageal reflux disease without esophagitis    Non-intractable vomiting    Acute blood loss anemia 01/02/2018   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    Syncope and collapse    COPD (chronic obstructive pulmonary disease) (HCC) 12/27/2017   Essential hypertension 12/27/2017   Near syncope 12/27/2017    Orientation RESPIRATION BLADDER Height & Weight     Self, Time, Situation, Place  Normal Continent Weight: 204 lb 2.3 oz (92.6 kg) Height:  5\' 6"  (167.6 cm)  BEHAVIORAL SYMPTOMS/MOOD NEUROLOGICAL BOWEL NUTRITION STATUS      Continent Diet (Regular)  AMBULATORY STATUS COMMUNICATION OF NEEDS Skin   Extensive Assist Verbally Normal                       Personal Care Assistance Level of Assistance  Bathing, Feeding, Dressing Bathing Assistance: Limited assistance Feeding assistance: Independent Dressing Assistance: Limited assistance     Functional Limitations Info  Sight, Speech, Hearing Sight Info:  Impaired Hearing Info: Adequate Speech Info: Adequate    SPECIAL CARE FACTORS FREQUENCY  PT (By licensed PT), OT (By licensed OT)     PT Frequency: 5 times weekly OT Frequency: 5 times weekly            Contractures Contractures Info: Not present    Additional Factors Info  Code Status, Allergies Code Status Info: FULL Allergies Info: NKA           Current Medications (04/13/2023):  This is the current hospital active medication list Current Facility-Administered Medications  Medication Dose Route Frequency Provider Last Rate Last Admin   acetaminophen (TYLENOL) tablet 650 mg  650 mg Oral Q6H PRN Elgergawy, Leana Roe, MD       Or   acetaminophen (TYLENOL) suppository 650 mg  650 mg Rectal Q6H PRN Elgergawy, Leana Roe, MD       albuterol (PROVENTIL) (2.5 MG/3ML) 0.083% nebulizer solution 2.5 mg  2.5 mg Nebulization Q6H PRN Elgergawy, Leana Roe, MD       B-complex with vitamin C tablet 1 tablet  1 tablet Oral Daily Elgergawy, Leana Roe, MD   1 tablet at 04/13/23 0817   cholecalciferol (VITAMIN D3) 25 MCG (1000 UNIT) tablet 1,000 Units  1,000 Units Oral Daily Elgergawy, Leana Roe, MD   1,000 Units at 04/13/23 0817   furosemide (LASIX) tablet 40 mg  40 mg Oral Daily Elgergawy, Leana Roe, MD   40 mg at 04/13/23 0817   heparin injection 5,000 Units  5,000 Units Subcutaneous Q8H Elgergawy, Leana Roe, MD   5,000 Units at 04/13/23 9604   latanoprost (XALATAN) 0.005 % ophthalmic solution 1 drop  1 drop Both Eyes QHS Elgergawy, Leana Roe, MD   1 drop at 04/13/23 0015   omega-3 acid ethyl esters (LOVAZA) capsule 1 g  1 g Oral BID Elgergawy, Leana Roe, MD   1 g at 04/13/23 0845   umeclidinium bromide (INCRUSE ELLIPTA) 62.5 MCG/ACT 1 puff  1 puff Inhalation Daily Vassie Loll, MD   1 puff at 04/13/23 1003     Discharge Medications: Please see discharge summary for a list of discharge medications.  Relevant Imaging Results:  Relevant Lab Results:   Additional Information SSN: [id number]  [address]

## 2023-04-13 NOTE — Progress Notes (Signed)
Progress Note   Patient: Randy Russo:[id number] DOB: [birth year] DOA: 04/12/2023     0 DOS: the patient was seen and examined on 04/13/2023   Brief hospital admission course: As per H&P written by Dr. Randol Kern on 04/12/2023  Randy Russo  is a 83 y.o. male, with past medical history of frailty, deconditioning, GI bleed due to diverticulosis, COPD, hypertension, arthritis and recurrent falls . -Patient with recent hospitalization at [facility] in [place], last September, for UTI, falls, fracture of C-spine managed conservatively with [facility], , was discharged to SNF, and apparently was discharged home, where he stayed with his granddaughter, but apparently has been in agreement with her, as she is trying to obtain healthcare power of attorney and to proceed with placement given his increased debility, deconditioning and falls, for which he was not been happy with, and decided to leave where he has been living in a motel for last couple days, nightly patient still driving, and his car broke down, he presents to ED secondary to complaints of worsening lower extremity edema, and generalized body ache, otherwise he denies any specific complaints. -In ED there was no significant labs abnormalities, chest x-ray with no acute findings, vital signs overall stable, but given his frailty and deconditioning.  Hospitalist consulted to admit.  Assessment and Plan: 1-ambulatory dysfunction/physical deconditioning and debility -Patient reported multiple falls at home -Patient was living with his granddaughter but recently moved out to a motel and has not been able to care after himself. -Evaluation by physical therapy with recommendations for skilled nursing facility to pursued short-term rehab. -TOC on board and helping with placement process.  2-history of COPD -No active wheezing or oxygen requirement appreciated -Continue home bronchodilator management.  3-lower extremity  edema/hypertension -Appears to be chronic and unchanged from baseline -Continue home dose diuretics and heart healthy/low-sodium diet -Chest x-ray demonstrated no vascular congestion and his BNP was normal.  4-rhinitis -Continue treatment with loratadine  5-dyslipidemia -Continue treatment with Lovaza -Heart healthy diet discussed with patient.  6-intraocular pressure/glaucoma -Continue treatment with Xalatan  7-class I obesity -Body mass index is 32.95 kg/m.  -Low-calorie diet and portion control discussed with patient.  Subjective:  Afebrile, no chest pain, no nausea, no vomiting.  Good saturation on room air.  Reporting feeling weak, tired and deconditioned.  Patient also expressing upper airways congestion.  Physical Exam: Vitals:   04/12/23 2222 04/12/23 2357 04/13/23 0700 04/13/23 1003  BP: 118/72 118/70    Pulse: 92 80    Resp: 20 18 18    Temp:  (!) 97.5 F (36.4 C) 98 F (36.7 C)   TempSrc:  Oral Oral   SpO2: 99% 98% 98% 94%  Weight:  92.6 kg    Height:  5\' 6"  (1.676 m)     General exam: Alert, awake, oriented x 3; in no acute distress. Respiratory system: Clear to auscultation. Respiratory effort normal.  Good saturation on room air. Cardiovascular system:RRR. No rubs or gallops. Gastrointestinal system: Abdomen is nondistended, soft and nontender.  Positive bowel sounds. Central nervous system: Moving 4 limbs spontaneously.  No focal neurological deficits. Extremities: No cyanosis or clubbing.  1+ edema appreciated bilaterally (chronic and unchanged from baseline). Skin: No petechiae. Psychiatry: Judgement and insight appear normal. Mood & affect appropriate.   Data Reviewed: Basic metabolic panel: Sodium 138, potassium 3.8, chloride 108, bicarb 24, BUN 20, creatinine 0.93 and GFR >60 CBC: White blood cell 6.3, hemoglobin 11.8 and platelet count 269K.   Family Communication: No family at  bedside.  Disposition: Status is: Observation The patient remains  OBS appropriate and will d/c before 2 midnights.  Planned Discharge Destination: Skilled nursing facility  Time spent: 40 minutes  Author: Vassie Loll, MD 04/13/2023 6:32 PM  For on call review [email or web address].

## 2023-04-14 DIAGNOSIS — I10 Essential (primary) hypertension: Secondary | ICD-10-CM | POA: Diagnosis not present

## 2023-04-14 DIAGNOSIS — R918 Other nonspecific abnormal finding of lung field: Secondary | ICD-10-CM | POA: Diagnosis not present

## 2023-04-14 DIAGNOSIS — Z79899 Other long term (current) drug therapy: Secondary | ICD-10-CM | POA: Diagnosis not present

## 2023-04-14 DIAGNOSIS — Z820 Family history of epilepsy and other diseases of the nervous system: Secondary | ICD-10-CM | POA: Diagnosis not present

## 2023-04-14 DIAGNOSIS — Z87891 Personal history of nicotine dependence: Secondary | ICD-10-CM | POA: Diagnosis not present

## 2023-04-14 DIAGNOSIS — R509 Fever, unspecified: Secondary | ICD-10-CM | POA: Diagnosis not present

## 2023-04-14 DIAGNOSIS — Z981 Arthrodesis status: Secondary | ICD-10-CM | POA: Diagnosis not present

## 2023-04-14 DIAGNOSIS — Z1152 Encounter for screening for COVID-19: Secondary | ICD-10-CM | POA: Diagnosis not present

## 2023-04-14 DIAGNOSIS — F4322 Adjustment disorder with anxiety: Secondary | ICD-10-CM | POA: Diagnosis not present

## 2023-04-14 DIAGNOSIS — K439 Ventral hernia without obstruction or gangrene: Secondary | ICD-10-CM | POA: Diagnosis not present

## 2023-04-14 DIAGNOSIS — R2689 Other abnormalities of gait and mobility: Secondary | ICD-10-CM | POA: Diagnosis not present

## 2023-04-14 DIAGNOSIS — Z96652 Presence of left artificial knee joint: Secondary | ICD-10-CM | POA: Diagnosis present

## 2023-04-14 DIAGNOSIS — J9811 Atelectasis: Secondary | ICD-10-CM | POA: Diagnosis not present

## 2023-04-14 DIAGNOSIS — Z833 Family history of diabetes mellitus: Secondary | ICD-10-CM | POA: Diagnosis not present

## 2023-04-14 DIAGNOSIS — Z9049 Acquired absence of other specified parts of digestive tract: Secondary | ICD-10-CM | POA: Diagnosis not present

## 2023-04-14 DIAGNOSIS — Z66 Do not resuscitate: Secondary | ICD-10-CM | POA: Diagnosis not present

## 2023-04-14 DIAGNOSIS — K7689 Other specified diseases of liver: Secondary | ICD-10-CM | POA: Diagnosis not present

## 2023-04-14 DIAGNOSIS — Z515 Encounter for palliative care: Secondary | ICD-10-CM | POA: Diagnosis not present

## 2023-04-14 DIAGNOSIS — D649 Anemia, unspecified: Secondary | ICD-10-CM | POA: Diagnosis not present

## 2023-04-14 DIAGNOSIS — R279 Unspecified lack of coordination: Secondary | ICD-10-CM | POA: Diagnosis not present

## 2023-04-14 DIAGNOSIS — Z8546 Personal history of malignant neoplasm of prostate: Secondary | ICD-10-CM | POA: Diagnosis not present

## 2023-04-14 DIAGNOSIS — R293 Abnormal posture: Secondary | ICD-10-CM | POA: Diagnosis not present

## 2023-04-14 DIAGNOSIS — R945 Abnormal results of liver function studies: Secondary | ICD-10-CM | POA: Diagnosis not present

## 2023-04-14 DIAGNOSIS — I81 Portal vein thrombosis: Secondary | ICD-10-CM | POA: Diagnosis not present

## 2023-04-14 DIAGNOSIS — R600 Localized edema: Secondary | ICD-10-CM | POA: Diagnosis not present

## 2023-04-14 DIAGNOSIS — J449 Chronic obstructive pulmonary disease, unspecified: Secondary | ICD-10-CM | POA: Diagnosis not present

## 2023-04-14 DIAGNOSIS — Z96641 Presence of right artificial hip joint: Secondary | ICD-10-CM | POA: Diagnosis present

## 2023-04-14 DIAGNOSIS — D72829 Elevated white blood cell count, unspecified: Secondary | ICD-10-CM | POA: Diagnosis present

## 2023-04-14 DIAGNOSIS — L89892 Pressure ulcer of other site, stage 2: Secondary | ICD-10-CM | POA: Diagnosis not present

## 2023-04-14 DIAGNOSIS — H402230 Chronic angle-closure glaucoma, bilateral, stage unspecified: Secondary | ICD-10-CM | POA: Diagnosis not present

## 2023-04-14 DIAGNOSIS — Z7951 Long term (current) use of inhaled steroids: Secondary | ICD-10-CM | POA: Diagnosis not present

## 2023-04-14 DIAGNOSIS — H4020X0 Unspecified primary angle-closure glaucoma, stage unspecified: Secondary | ICD-10-CM | POA: Diagnosis not present

## 2023-04-14 DIAGNOSIS — K8689 Other specified diseases of pancreas: Secondary | ICD-10-CM | POA: Diagnosis not present

## 2023-04-14 DIAGNOSIS — N281 Cyst of kidney, acquired: Secondary | ICD-10-CM | POA: Diagnosis not present

## 2023-04-14 DIAGNOSIS — I771 Stricture of artery: Secondary | ICD-10-CM | POA: Diagnosis not present

## 2023-04-14 DIAGNOSIS — K5730 Diverticulosis of large intestine without perforation or abscess without bleeding: Secondary | ICD-10-CM | POA: Diagnosis not present

## 2023-04-14 DIAGNOSIS — E559 Vitamin D deficiency, unspecified: Secondary | ICD-10-CM | POA: Diagnosis not present

## 2023-04-14 DIAGNOSIS — Z7189 Other specified counseling: Secondary | ICD-10-CM | POA: Diagnosis not present

## 2023-04-14 DIAGNOSIS — R7989 Other specified abnormal findings of blood chemistry: Secondary | ICD-10-CM | POA: Diagnosis not present

## 2023-04-14 DIAGNOSIS — C61 Malignant neoplasm of prostate: Secondary | ICD-10-CM | POA: Diagnosis not present

## 2023-04-14 DIAGNOSIS — R059 Cough, unspecified: Secondary | ICD-10-CM | POA: Diagnosis not present

## 2023-04-14 DIAGNOSIS — R296 Repeated falls: Secondary | ICD-10-CM | POA: Diagnosis not present

## 2023-04-14 DIAGNOSIS — R262 Difficulty in walking, not elsewhere classified: Secondary | ICD-10-CM | POA: Diagnosis not present

## 2023-04-14 DIAGNOSIS — Z7901 Long term (current) use of anticoagulants: Secondary | ICD-10-CM | POA: Diagnosis not present

## 2023-04-14 DIAGNOSIS — E876 Hypokalemia: Secondary | ICD-10-CM | POA: Diagnosis not present

## 2023-04-14 DIAGNOSIS — M6281 Muscle weakness (generalized): Secondary | ICD-10-CM | POA: Diagnosis not present

## 2023-04-14 DIAGNOSIS — R54 Age-related physical debility: Secondary | ICD-10-CM | POA: Diagnosis not present

## 2023-04-14 MED ORDER — B COMPLEX-C PO TABS: 1.0000 | ORAL_TABLET | Freq: Every day | ORAL | Status: AC

## 2023-04-14 MED ORDER — LORATADINE 10 MG PO TABS: 10.0000 mg | ORAL_TABLET | Freq: Every day | ORAL | Status: AC

## 2023-04-14 MED ORDER — CHOLECALCIFEROL 25 MCG (1000 UT) PO CAPS: 1000.0000 [IU] | ORAL_CAPSULE | Freq: Every day | ORAL | Status: AC

## 2023-04-14 MED ORDER — ALBUTEROL SULFATE (2.5 MG/3ML) 0.083% IN NEBU: 2.5000 mg | INHALATION_SOLUTION | Freq: Four times a day (QID) | RESPIRATORY_TRACT | 12 refills | Status: AC | PRN

## 2023-04-14 MED ORDER — FISH OIL 1200 MG PO CAPS: 1200.0000 mg | ORAL_CAPSULE | Freq: Every day | ORAL | Status: DC

## 2023-04-14 MED ORDER — LUMIGAN 0.01 % OP SOLN: 1.0000 [drp] | Freq: Every day | OPHTHALMIC | Status: DC

## 2023-04-14 MED ORDER — UMECLIDINIUM BROMIDE 62.5 MCG/ACT IN AEPB: 1.0000 | INHALATION_SPRAY | Freq: Every day | RESPIRATORY_TRACT | Status: DC

## 2023-04-14 NOTE — TOC Transition Note (Signed)
Transition of Care [facility]) - Discharge Note   Patient Details  Name: Randy Russo MRN: [id number] Date of Birth: 10-16-1940  Transition of Care [facility]) CM/SW Contact:  Karn Cassis, LCSW Phone Number: 04/14/2023, 1:49 PM   Clinical Narrative:  SNF authorization received. Pt will d/c to [facility] today. Pt reports he needs transportation. LCSW to arrange with Juel Burrow. D/C summary sent to SNF. RN given number to call report. Pt does not want his family updated.     Final next level of care: Skilled Nursing Facility Barriers to Discharge: Barriers Resolved   Patient Goals and CMS Choice Patient states their goals for this hospitalization and ongoing recovery are:: go to SNF CMS Medicare.gov Compare Post Acute Care list provided to:: Patient Choice offered to / list presented to : Patient [place] ownership interest in [facility].provided to:: Patient    Discharge Placement              Patient chooses bed at: Other - please specify in the comment section below: [facility]) Patient to be transferred to facility by: [employer] Name of family member notified: Pt does not want family notified. Patient and family notified of of transfer: 04/14/23  Discharge Plan and Services Additional resources added to the After Visit Summary for   In-house Referral: Clinical Social Work Discharge Planning Services: CM Consult Post Acute Care Choice: Skilled Nursing Facility                               Social Drivers of Health (SDOH) Interventions SDOH Screenings   Food Insecurity: Food Insecurity Present (04/13/2023)  Housing: High Risk (04/13/2023)  Transportation Needs: No Transportation Needs (04/13/2023)  Utilities: Not At Risk (04/13/2023)  Financial Resource Strain: Low Risk  (11/30/2022)   [facility]  Physical Activity: Unknown (12/27/2017)  Social Connections: Unknown (04/13/2023)  Tobacco Use: Medium Risk  (04/12/2023)  Health Literacy: Medium Risk (11/30/2022)   Received from [facility]     Readmission Risk Interventions     No data to display

## 2023-04-14 NOTE — TOC Progression Note (Signed)
Transition of Care [facility]) - Progression Note    Patient Details  Name: Randy Russo MRN: [id number] Date of Birth: [birth year]  Transition of Care [facility]) CM/SW Contact  Karn Cassis, [place] Phone Number: 04/14/2023, 11:23 AM  Clinical Narrative: Pt accepts bed at [facility]. Facility and insurance auth updated. TOC will follow.       Expected Discharge Plan: Skilled Nursing Facility Barriers to Discharge: Continued Medical Work up  Expected Discharge Plan and Services In-house Referral: Clinical Social Work Discharge Planning Services: CM Consult Post Acute Care Choice: Skilled Nursing Facility Living arrangements for the past 2 months: Single Family Home                                       Social Determinants of Health (SDOH) Interventions SDOH Screenings   Food Insecurity: Food Insecurity Present (04/13/2023)  Housing: High Risk (04/13/2023)  Transportation Needs: No Transportation Needs (04/13/2023)  Utilities: Not At Risk (04/13/2023)  Financial Resource Strain: Low Risk  (11/30/2022)   [facility]  Physical Activity: Unknown (12/27/2017)  Social Connections: Unknown (04/13/2023)  Tobacco Use: Medium Risk (04/12/2023)  Health Literacy: Medium Risk (11/30/2022)   Received from [facility]    Readmission Risk Interventions     No data to display

## 2023-04-14 NOTE — Discharge Summary (Addendum)
 Physician Discharge Summary   Patient: Randy Russo MRN: [id number] DOB: 12/07/1940  Admit date:     04/12/2023  Discharge date: 04/14/23  Discharge Physician: Vassie Loll   PCP: Assunta Found, MD   Recommendations at discharge:  Repeat basic metabolic panel to follow electrolytes and renal function Continue to assist patient with weight management. Outpatient follow-up with pulmonologist if needed to further adjust maintenance bronchodilator management.   Discharge Diagnoses: Principal Problem:   Ambulatory dysfunction Active Problems:   COPD (chronic obstructive pulmonary disease) (HCC)   Essential hypertension  Brief hospital admission course: As per H&P written by Dr. Randol Kern on 04/12/2023  Randy Russo  is a 83 y.o. male, with past medical history of frailty, deconditioning, GI bleed due to diverticulosis, COPD, hypertension, arthritis and recurrent falls . -Patient with recent hospitalization at [facility] in [place], last September, for UTI, falls, fracture of C-spine managed conservatively with [facility], , was discharged to SNF, and apparently was discharged home, where he stayed with his granddaughter, but apparently has been in agreement with her, as she is trying to obtain healthcare power of attorney and to proceed with placement given his increased debility, deconditioning and falls, for which he was not been happy with, and decided to leave where he has been living in a motel for last couple days, nightly patient still driving, and his car broke down, he presents to ED secondary to complaints of worsening lower extremity edema, and generalized body ache, otherwise he denies any specific complaints. -In ED there was no significant labs abnormalities, chest x-ray with no acute findings, vital signs overall stable, but given his frailty and deconditioning.  Hospitalist consulted to admit.  Assessment and Plan: 1-ambulatory dysfunction/physical deconditioning and  debility -Patient reported multiple falls at home -Patient was living with his granddaughter but recently moved out to a motel and has not been able to care after himself. -Evaluation by physical therapy with recommendations for skilled nursing facility to pursued short-term rehab. -TOC on board and helping with placement process.   2-history of COPD -No active wheezing or oxygen requirement appreciated -Continue home bronchodilator management.   3-lower extremity edema/hypertension -Appears to be chronic and unchanged from baseline -Continue home dose diuretics and heart healthy/low-sodium diet -Chest x-ray demonstrated no vascular congestion and his BNP was normal.   4-rhinitis -Continue treatment with loratadine   5-dyslipidemia -Continue treatment with Lovaza -Heart healthy diet discussed with patient.   6-intraocular pressure/glaucoma -Continue treatment with Lumigan. -Outpatient follow-up with ophthalmology service recommended.   7-class I obesity -Body mass index is 32.95 kg/m.  -Low-calorie diet and portion control discussed with patient.   Consultants: None Procedures performed: See below for x-ray reports. Disposition: Skilled nursing facility Diet recommendation: Heart healthy diet.  DISCHARGE MEDICATION: Allergies as of 04/14/2023   No Known Allergies      Medication List     STOP taking these medications    aspirin EC 81 MG tablet       TAKE these medications    albuterol (2.5 MG/3ML) 0.083% nebulizer solution Commonly known as: PROVENTIL Take 3 mLs (2.5 mg total) by nebulization every 6 (six) hours as needed for wheezing or shortness of breath.   B-complex with vitamin C tablet Take 1 tablet by mouth daily. Start taking on: April 15, 2023   Cholecalciferol 25 MCG (1000 UT) capsule Take 1 capsule (1,000 Units total) by mouth daily.   Fish Oil 1200 MG Caps Take 1 capsule (1,200 mg total) by mouth daily.  furosemide 20 MG  tablet Commonly known as: LASIX Take 20 mg by mouth daily.   loratadine 10 MG tablet Commonly known as: CLARITIN Take 1 tablet (10 mg total) by mouth daily. Start taking on: April 15, 2023   Lumigan 0.01 % Soln Generic drug: bimatoprost Place 1 drop into both eyes at bedtime.   umeclidinium bromide 62.5 MCG/ACT Aepb Commonly known as: INCRUSE ELLIPTA Inhale 1 puff into the lungs daily. Start taking on: April 15, 2023        Contact information for follow-up providers     Assunta Found, MD. Schedule an appointment as soon as possible for a visit in 10 day(s).   Specialty: Family Medicine Why: After discharge from the skilled nursing facility. Contact information: [address] [place] [address] [phone]              Contact information for after-discharge care     Destination     [facility] .   Service: Skilled Nursing Contact information: [address] [place] [address] [phone]                    Discharge Exam: Ceasar Mons Weights   04/12/23 2357  Weight: 92.6 kg   General exam: Alert, awake, oriented x 3; in no acute distress. Respiratory system: Clear to auscultation. Respiratory effort normal.  Good saturation on room air. Cardiovascular system:RRR. No rubs or gallops. Gastrointestinal system: Abdomen is nondistended, soft and nontender.  Positive bowel sounds. Central nervous system: Moving 4 limbs spontaneously.  No focal neurological deficits. Extremities: No cyanosis or clubbing.  1+ edema appreciated bilaterally (chronic and unchanged from baseline). Skin: No petechiae. Psychiatry: Judgement and insight appear normal. Mood & affect appropriate.   Condition at discharge: Stable and improved.  The results of significant diagnostics from this hospitalization (including imaging, microbiology, ancillary and laboratory) are listed below for reference.    Imaging Studies: DG Chest Port 1 View Result Date: 04/12/2023 CLINICAL DATA:  Short of breath, lower extremity edema EXAM: PORTABLE CHEST 1 VIEW COMPARISON:  11/29/2022 FINDINGS: Single frontal view of the chest demonstrates a stable cardiac silhouette. Continued ectasia of the thoracic aorta. No airspace disease, effusion, or pneumothorax. No acute bony abnormality. IMPRESSION: 1. No acute intrathoracic process. Electronically Signed   By: Sharlet Salina M.D.   On: 04/12/2023 18:43    Microbiology: Results for orders placed or performed during the hospital encounter of 04/12/23  SARS Coronavirus 2 by RT PCR (hospital order, performed in [facility] hospital lab) *cepheid single result test* Anterior Nasal Swab     Status: None   Collection Time: 04/12/23  9:35 PM   Specimen: Anterior Nasal Swab  Result Value Ref Range Status   SARS Coronavirus 2 by RT PCR NEGATIVE NEGATIVE Final    Comment: (NOTE) SARS-CoV-2 target nucleic acids are NOT DETECTED.  The SARS-CoV-2 RNA is generally detectable in upper and lower respiratory specimens during the acute phase of infection. The lowest concentration of SARS-CoV-2 viral copies this assay can detect is 250 copies / mL. A negative result does not preclude SARS-CoV-2 infection and should not be used as the sole basis for treatment or other patient management decisions.  A negative result may occur with improper specimen collection / handling, submission of specimen other than nasopharyngeal swab, presence of viral mutation(s) within the areas targeted by this assay, and inadequate number of viral copies (<250 copies / mL). A negative result must be combined  with clinical observations, patient history, and epidemiological information.  Fact Sheet for Patients:   RoadLapTop.co.za  Fact Sheet for Healthcare Providers: http://kim-miller.com/  This test is not yet approved or  cleared by the [place] FDA and has been authorized for detection and/or diagnosis of SARS-CoV-2 by FDA under an Emergency Use Authorization (EUA).  This EUA will remain in effect (meaning this test can be used) for the duration of the COVID-19 declaration under Section 564(b)(1) of the Act, 21 U.S.C. section 360bbb-3(b)(1), unless the authorization is terminated or revoked sooner.  Performed at [facility], [address], [place], [place] [address]     Labs: CBC: Recent Labs  Lab 04/12/23 1822 04/13/23 0524  WBC 6.7 6.3  NEUTROABS 4.9  --   HGB 11.8* 11.8*  HCT 38.1* 38.6*  MCV 84.5 84.1  PLT 255 269   Basic Metabolic Panel: Recent Labs  Lab 04/12/23 1822 04/13/23 0524  NA 138 138  K 3.9 3.8  CL 109 108  CO2 25 24  GLUCOSE 103* 93  BUN 19 20  CREATININE 1.01 0.93  CALCIUM 9.2 9.2   Liver Function Tests: Recent Labs  Lab 04/12/23 1822  AST 15  ALT 16  ALKPHOS 60  BILITOT 0.4  PROT 7.1  ALBUMIN 3.2*   CBG: No results for input(s): "GLUCAP" in the last 168 hours.  Discharge time spent: greater than 30 minutes.  Signed: Vassie Loll, MD [facility] 04/14/2023

## 2023-04-14 NOTE — Progress Notes (Signed)
Report called to [facility]. ?

## 2023-04-14 NOTE — Evaluation (Signed)
Occupational Therapy Evaluation Patient Details Name: Randy Russo MRN: [id number] DOB: [birth year] Today's Date: 04/14/2023   History of Present Illness Randy Russo  is a 83 y.o. male, with past medical history of frailty, deconditioning, GI bleed due to diverticulosis, COPD, hypertension, arthritis and recurrent falls .  -Patient with recent hospitalization at [facility] in [place], last September, for UTI, falls, fracture of C-spine managed conservatively with [facility], , was discharged to SNF, and apparently was discharged home, where he stayed with his granddaughter, but apparently has been in agreement with her, as she is trying to obtain healthcare power of attorney and to proceed with placement given his increased debility, deconditioning and falls, for which he was not been happy with, and decided to leave where he has been living in a motel for last couple days, nightly patient still driving, and his car broke down, he presents to ED secondary to complaints of worsening lower extremity edema, and generalized body ache, otherwise he denies any specific complaints.   Clinical Impression   Pt admitted for concerns listed above. PTA pt reported that he was independent with all mobility and ADL's. Recently living in a hotel. At this time, pt presents with increased weakness, balance deficits, increased edema, and poor activity tolerance. Pt will benefit from further skilled therapy to maximize his independence and safety. Acute OT will continue to follow.        If plan is discharge home, recommend the following: A little help with walking and/or transfers;A little help with bathing/dressing/bathroom;Assistance with cooking/housework;Direct supervision/assist for medications management;Direct supervision/assist for financial management;Supervision due to cognitive status    Functional Status Assessment  Patient has had a recent decline in their functional status and demonstrates the  ability to make significant improvements in function in a reasonable and predictable amount of time.  Equipment Recommendations  None recommended by OT    Recommendations for Other Services       Precautions / Restrictions Precautions Precautions: Fall Restrictions Weight Bearing Restrictions Per Provider Order: No      Mobility Bed Mobility               General bed mobility comments: Up in recliner on entry    Transfers Overall transfer level: Needs assistance Equipment used: Rolling walker (2 wheels) Transfers: Sit to/from Stand Sit to Stand: Min assist           General transfer comment: unsteady, had to use armrest of chair or RW, poor tolerance for standing      Balance Overall balance assessment: Needs assistance Sitting-balance support: Feet supported, No upper extremity supported Sitting balance-Leahy Scale: Good     Standing balance support: During functional activity, Reliant on assistive device for balance, Bilateral upper extremity supported Standing balance-Leahy Scale: Poor Standing balance comment: using RW, leaning on armrest of chair                           ADL either performed or assessed with clinical judgement   ADL Overall ADL's : Needs assistance/impaired Eating/Feeding: Independent;Sitting   Grooming: Contact guard assist;Standing   Upper Body Bathing: Set up;Sitting   Lower Body Bathing: Minimal assistance;Sitting/lateral leans;Sit to/from stand   Upper Body Dressing : Set up;Sitting   Lower Body Dressing: Minimal assistance;Sitting/lateral leans;Sit to/from stand   Toilet Transfer: Minimal assistance   Toileting- Clothing Manipulation and Hygiene: Minimal assistance;Sitting/lateral lean;Sit to/from stand         General ADL Comments: Pt  requiring increased assist due to LE edema and weakness/balance deficits     Vision Baseline Vision/History: 0 No visual deficits Ability to See in Adequate Light: 0  Adequate Patient Visual Report: No change from baseline       Perception         Praxis         Pertinent Vitals/Pain Pain Assessment Pain Assessment: No/denies pain     Extremity/Trunk Assessment Upper Extremity Assessment Upper Extremity Assessment: Overall WFL for tasks assessed   Lower Extremity Assessment Lower Extremity Assessment: Defer to PT evaluation   Cervical / Trunk Assessment Cervical / Trunk Assessment: Kyphotic   Communication Communication Communication: No apparent difficulties   Cognition Arousal: Alert Behavior During Therapy: WFL for tasks assessed/performed Overall Cognitive Status: No family/caregiver present to determine baseline cognitive functioning                                       General Comments  VSS on RA    Exercises     Shoulder Instructions      Home Living Family/patient expects to be discharged to:: Private residence Living Arrangements: Alone Available Help at Discharge: Family;Available PRN/intermittently Type of Home: House Home Access: Stairs to enter Entrance Stairs-Number of Steps: 1 Entrance Stairs-Rails: None Home Layout: One level     Bathroom Shower/Tub: Producer, television/film/video: Standard Bathroom Accessibility: Yes   Home Equipment: Agricultural consultant (2 wheels);Wheelchair - manual   Additional Comments: Recently staying at a Motel      Prior Functioning/Environment Prior Level of Function : Independent/Modified Independent;Driving             Mobility Comments: uses wheelchair mostly, very short household distances using RW ADLs Comments: Indepedent, drives        OT Problem List: Decreased strength;Decreased range of motion;Decreased activity tolerance;Impaired balance (sitting and/or standing);Decreased coordination;Decreased safety awareness;Decreased knowledge of use of DME or AE;Impaired UE functional use;Increased edema      OT Treatment/Interventions:  Self-care/ADL training;Therapeutic exercise;Energy conservation;DME and/or AE instruction;Manual therapy;Therapeutic activities;Cognitive remediation/compensation;Balance training;Patient/family education    OT Goals(Current goals can be found in the care plan section) Acute Rehab OT Goals Patient Stated Goal: To get out of the hospital OT Goal Formulation: With patient Time For Goal Achievement: 04/28/23 Potential to Achieve Goals: Good ADL Goals Pt Will Perform Grooming: with modified independence;standing Pt Will Perform Lower Body Bathing: with modified independence;sitting/lateral leans;sit to/from stand Pt Will Perform Lower Body Dressing: with modified independence;sit to/from stand;sitting/lateral leans Pt Will Transfer to Toilet: with modified independence;ambulating Pt Will Perform Toileting - Clothing Manipulation and hygiene: with modified independence;sitting/lateral leans;sit to/from stand  OT Frequency: Min 1X/week    Co-evaluation              AM-PAC OT "6 Clicks" Daily Activity     Outcome Measure Help from another person eating meals?: None Help from another person taking care of personal grooming?: A Little Help from another person toileting, which includes using toliet, bedpan, or urinal?: A Little Help from another person bathing (including washing, rinsing, drying)?: A Little Help from another person to put on and taking off regular upper body clothing?: A Little Help from another person to put on and taking off regular lower body clothing?: A Little 6 Click Score: 19   End of Session Equipment Utilized During Treatment: Gait belt;Rolling walker (2 wheels) Nurse Communication: Mobility status  Activity Tolerance: Patient tolerated treatment well Patient left: with call bell/phone within reach;in bed  OT Visit Diagnosis: Unsteadiness on feet (R26.81);Other abnormalities of gait and mobility (R26.89);Muscle weakness (generalized) (M62.81)                 Time: 4403-4742 OT Time Calculation (min): 20 min Charges:  OT General Charges $OT Visit: 1 Visit OT Evaluation $OT Eval Moderate Complexity: 1 Mod  Anise Harbin, OTR/L [facility]  Jeremie Giangrande Rosemarie Beath 04/14/2023, 2:47 PM

## 2023-04-14 NOTE — Plan of Care (Signed)
  Problem: Education: Goal: Knowledge of General Education information will improve Description Including pain rating scale, medication(s)/side effects and non-pharmacologic comfort measures Outcome: Progressing   Problem: Health Behavior/Discharge Planning: Goal: Ability to manage health-related needs will improve Outcome: Progressing   

## 2023-04-16 DIAGNOSIS — J449 Chronic obstructive pulmonary disease, unspecified: Secondary | ICD-10-CM | POA: Diagnosis not present

## 2023-04-16 DIAGNOSIS — R262 Difficulty in walking, not elsewhere classified: Secondary | ICD-10-CM | POA: Diagnosis not present

## 2023-04-16 DIAGNOSIS — H4020X0 Unspecified primary angle-closure glaucoma, stage unspecified: Secondary | ICD-10-CM | POA: Diagnosis not present

## 2023-04-16 DIAGNOSIS — E559 Vitamin D deficiency, unspecified: Secondary | ICD-10-CM | POA: Diagnosis not present

## 2023-04-16 DIAGNOSIS — M6281 Muscle weakness (generalized): Secondary | ICD-10-CM | POA: Diagnosis not present

## 2023-04-17 DIAGNOSIS — E559 Vitamin D deficiency, unspecified: Secondary | ICD-10-CM | POA: Diagnosis not present

## 2023-04-17 DIAGNOSIS — H402230 Chronic angle-closure glaucoma, bilateral, stage unspecified: Secondary | ICD-10-CM | POA: Diagnosis not present

## 2023-04-17 DIAGNOSIS — M6281 Muscle weakness (generalized): Secondary | ICD-10-CM | POA: Diagnosis not present

## 2023-04-17 DIAGNOSIS — J449 Chronic obstructive pulmonary disease, unspecified: Secondary | ICD-10-CM | POA: Diagnosis not present

## 2023-04-18 DIAGNOSIS — M6281 Muscle weakness (generalized): Secondary | ICD-10-CM | POA: Diagnosis not present

## 2023-04-18 DIAGNOSIS — H4020X0 Unspecified primary angle-closure glaucoma, stage unspecified: Secondary | ICD-10-CM | POA: Diagnosis not present

## 2023-04-18 DIAGNOSIS — J449 Chronic obstructive pulmonary disease, unspecified: Secondary | ICD-10-CM | POA: Diagnosis not present

## 2023-04-18 DIAGNOSIS — R262 Difficulty in walking, not elsewhere classified: Secondary | ICD-10-CM | POA: Diagnosis not present

## 2023-04-18 DIAGNOSIS — E559 Vitamin D deficiency, unspecified: Secondary | ICD-10-CM | POA: Diagnosis not present

## 2023-04-21 DIAGNOSIS — E559 Vitamin D deficiency, unspecified: Secondary | ICD-10-CM | POA: Diagnosis not present

## 2023-04-21 DIAGNOSIS — J449 Chronic obstructive pulmonary disease, unspecified: Secondary | ICD-10-CM | POA: Diagnosis not present

## 2023-04-21 DIAGNOSIS — C61 Malignant neoplasm of prostate: Secondary | ICD-10-CM | POA: Diagnosis not present

## 2023-04-21 DIAGNOSIS — M6281 Muscle weakness (generalized): Secondary | ICD-10-CM | POA: Diagnosis not present

## 2023-04-21 DIAGNOSIS — R262 Difficulty in walking, not elsewhere classified: Secondary | ICD-10-CM | POA: Diagnosis not present

## 2023-04-21 DIAGNOSIS — H4020X0 Unspecified primary angle-closure glaucoma, stage unspecified: Secondary | ICD-10-CM | POA: Diagnosis not present

## 2023-04-22 ENCOUNTER — Emergency Department (HOSPITAL_COMMUNITY): Payer: Medicare HMO

## 2023-04-22 ENCOUNTER — Other Ambulatory Visit: Payer: Self-pay

## 2023-04-22 ENCOUNTER — Inpatient Hospital Stay (HOSPITAL_COMMUNITY)
Admission: EM | Admit: 2023-04-22 | Discharge: 2023-04-25 | DRG: 443 | Disposition: A | Discharge status: Skilled Nursing Facility | Payer: Medicare HMO | Source: Skilled Nursing Facility | Attending: Family Medicine | Admitting: Family Medicine

## 2023-04-22 DIAGNOSIS — R7989 Other specified abnormal findings of blood chemistry: Secondary | ICD-10-CM

## 2023-04-22 DIAGNOSIS — Z87891 Personal history of nicotine dependence: Secondary | ICD-10-CM | POA: Diagnosis not present

## 2023-04-22 DIAGNOSIS — D72829 Elevated white blood cell count, unspecified: Secondary | ICD-10-CM | POA: Diagnosis present

## 2023-04-22 DIAGNOSIS — Z1152 Encounter for screening for COVID-19: Secondary | ICD-10-CM

## 2023-04-22 DIAGNOSIS — Z7189 Other specified counseling: Secondary | ICD-10-CM | POA: Diagnosis not present

## 2023-04-22 DIAGNOSIS — I81 Portal vein thrombosis: Principal | ICD-10-CM | POA: Diagnosis present

## 2023-04-22 DIAGNOSIS — Z820 Family history of epilepsy and other diseases of the nervous system: Secondary | ICD-10-CM

## 2023-04-22 DIAGNOSIS — K439 Ventral hernia without obstruction or gangrene: Secondary | ICD-10-CM | POA: Diagnosis not present

## 2023-04-22 DIAGNOSIS — R296 Repeated falls: Secondary | ICD-10-CM | POA: Diagnosis present

## 2023-04-22 DIAGNOSIS — J9811 Atelectasis: Secondary | ICD-10-CM | POA: Diagnosis not present

## 2023-04-22 DIAGNOSIS — J449 Chronic obstructive pulmonary disease, unspecified: Secondary | ICD-10-CM | POA: Diagnosis present

## 2023-04-22 DIAGNOSIS — Z8546 Personal history of malignant neoplasm of prostate: Secondary | ICD-10-CM | POA: Diagnosis not present

## 2023-04-22 DIAGNOSIS — Z96652 Presence of left artificial knee joint: Secondary | ICD-10-CM | POA: Diagnosis present

## 2023-04-22 DIAGNOSIS — Z66 Do not resuscitate: Secondary | ICD-10-CM | POA: Diagnosis present

## 2023-04-22 DIAGNOSIS — Z833 Family history of diabetes mellitus: Secondary | ICD-10-CM | POA: Diagnosis not present

## 2023-04-22 DIAGNOSIS — K7689 Other specified diseases of liver: Secondary | ICD-10-CM | POA: Diagnosis not present

## 2023-04-22 DIAGNOSIS — E876 Hypokalemia: Secondary | ICD-10-CM | POA: Diagnosis not present

## 2023-04-22 DIAGNOSIS — Z79899 Other long term (current) drug therapy: Secondary | ICD-10-CM

## 2023-04-22 DIAGNOSIS — R293 Abnormal posture: Secondary | ICD-10-CM | POA: Diagnosis not present

## 2023-04-22 DIAGNOSIS — L89892 Pressure ulcer of other site, stage 2: Secondary | ICD-10-CM | POA: Diagnosis not present

## 2023-04-22 DIAGNOSIS — Z981 Arthrodesis status: Secondary | ICD-10-CM

## 2023-04-22 DIAGNOSIS — Z7901 Long term (current) use of anticoagulants: Secondary | ICD-10-CM

## 2023-04-22 DIAGNOSIS — I10 Essential (primary) hypertension: Secondary | ICD-10-CM | POA: Diagnosis present

## 2023-04-22 DIAGNOSIS — D649 Anemia, unspecified: Secondary | ICD-10-CM

## 2023-04-22 DIAGNOSIS — R509 Fever, unspecified: Secondary | ICD-10-CM | POA: Diagnosis not present

## 2023-04-22 DIAGNOSIS — R918 Other nonspecific abnormal finding of lung field: Secondary | ICD-10-CM | POA: Diagnosis not present

## 2023-04-22 DIAGNOSIS — Z515 Encounter for palliative care: Secondary | ICD-10-CM | POA: Diagnosis not present

## 2023-04-22 DIAGNOSIS — Z96641 Presence of right artificial hip joint: Secondary | ICD-10-CM | POA: Diagnosis present

## 2023-04-22 DIAGNOSIS — K5730 Diverticulosis of large intestine without perforation or abscess without bleeding: Secondary | ICD-10-CM | POA: Diagnosis not present

## 2023-04-22 DIAGNOSIS — R279 Unspecified lack of coordination: Secondary | ICD-10-CM | POA: Diagnosis not present

## 2023-04-22 DIAGNOSIS — Z7951 Long term (current) use of inhaled steroids: Secondary | ICD-10-CM

## 2023-04-22 DIAGNOSIS — I771 Stricture of artery: Secondary | ICD-10-CM | POA: Diagnosis not present

## 2023-04-22 DIAGNOSIS — M6281 Muscle weakness (generalized): Secondary | ICD-10-CM | POA: Diagnosis not present

## 2023-04-22 DIAGNOSIS — R059 Cough, unspecified: Secondary | ICD-10-CM | POA: Diagnosis not present

## 2023-04-22 DIAGNOSIS — Z9049 Acquired absence of other specified parts of digestive tract: Secondary | ICD-10-CM | POA: Diagnosis not present

## 2023-04-22 DIAGNOSIS — R945 Abnormal results of liver function studies: Secondary | ICD-10-CM | POA: Diagnosis not present

## 2023-04-22 DIAGNOSIS — N281 Cyst of kidney, acquired: Secondary | ICD-10-CM | POA: Diagnosis not present

## 2023-04-22 DIAGNOSIS — K8689 Other specified diseases of pancreas: Secondary | ICD-10-CM | POA: Diagnosis not present

## 2023-04-22 DIAGNOSIS — R262 Difficulty in walking, not elsewhere classified: Secondary | ICD-10-CM | POA: Diagnosis not present

## 2023-04-22 LAB — COMPREHENSIVE METABOLIC PANEL
ALT: 177 U/L — ABNORMAL HIGH (ref 0–44)
AST: 183 U/L — ABNORMAL HIGH (ref 15–41)
Albumin: 2.4 g/dL — ABNORMAL LOW (ref 3.5–5.0)
Alkaline Phosphatase: 84 U/L (ref 38–126)
Anion gap: 11 (ref 5–15)
BUN: 22 mg/dL (ref 8–23)
CO2: 21 mmol/L — ABNORMAL LOW (ref 22–32)
Calcium: 8.6 mg/dL — ABNORMAL LOW (ref 8.9–10.3)
Chloride: 103 mmol/L (ref 98–111)
Creatinine, Ser: 0.95 mg/dL (ref 0.61–1.24)
GFR, Estimated: >60 mL/min (ref >60)
Glucose, Bld: 125 mg/dL — ABNORMAL HIGH (ref 70–99)
Potassium: 3.3 mmol/L — ABNORMAL LOW (ref 3.5–5.1)
Sodium: 135 mmol/L (ref 135–145)
Total Bilirubin: 2.5 mg/dL — ABNORMAL HIGH (ref 0.0–1.2)
Total Protein: 7.2 g/dL (ref 6.5–8.1)

## 2023-04-22 LAB — URINALYSIS, ROUTINE W REFLEX MICROSCOPIC
Bilirubin Urine: NEGATIVE
Glucose, UA: NEGATIVE mg/dL
Ketones, ur: NEGATIVE mg/dL
Leukocytes,Ua: NEGATIVE
Nitrite: NEGATIVE
Protein, ur: 30 mg/dL — AB
Specific Gravity, Urine: 1.02 (ref 1.005–1.030)
pH: 5 (ref 5.0–8.0)

## 2023-04-22 LAB — CBC
HCT: 33.6 % — ABNORMAL LOW (ref 39.0–52.0)
Hemoglobin: 10.6 g/dL — ABNORMAL LOW (ref 13.0–17.0)
MCH: 25.9 pg — ABNORMAL LOW (ref 26.0–34.0)
MCHC: 31.5 g/dL (ref 30.0–36.0)
MCV: 82.2 fL (ref 80.0–100.0)
Platelets: 231 10*3/uL (ref 150–400)
RBC: 4.09 MIL/uL — ABNORMAL LOW (ref 4.22–5.81)
RDW: 19.6 % — ABNORMAL HIGH (ref 11.5–15.5)
WBC: 13.2 10*3/uL — ABNORMAL HIGH (ref 4.0–10.5)
nRBC: 0 % (ref 0.0–0.2)

## 2023-04-22 LAB — RESP PANEL BY RT-PCR (RSV, FLU A&B, COVID)  RVPGX2
Influenza A by PCR: NEGATIVE
Influenza B by PCR: NEGATIVE
Resp Syncytial Virus by PCR: NEGATIVE
SARS Coronavirus 2 by RT PCR: NEGATIVE

## 2023-04-22 LAB — PROTIME-INR
INR: 1.2 (ref 0.8–1.2)
Prothrombin Time: 15.7 s — ABNORMAL HIGH (ref 11.4–15.2)

## 2023-04-22 LAB — APTT: aPTT: 27 s (ref 24–36)

## 2023-04-22 LAB — LACTIC ACID, PLASMA
Lactic Acid, Venous: 1.8 mmol/L (ref 0.5–1.9)
Lactic Acid, Venous: 1 mmol/L (ref 0.5–1.9)

## 2023-04-22 LAB — SEDIMENTATION RATE: Sed Rate: 80 mm/h — ABNORMAL HIGH (ref 0–16)

## 2023-04-22 LAB — ACETAMINOPHEN LEVEL: Acetaminophen (Tylenol), Serum: 39 ug/mL — ABNORMAL HIGH (ref 10–30)

## 2023-04-22 MED ORDER — ALBUTEROL SULFATE (2.5 MG/3ML) 0.083% IN NEBU: 2.5000 mg | INHALATION_SOLUTION | Freq: Four times a day (QID) | RESPIRATORY_TRACT | Status: DC | PRN

## 2023-04-22 MED ORDER — VITAMIN D 25 MCG (1000 UNIT) PO TABS
1000.0000 [IU] | ORAL_TABLET | Freq: Every day | ORAL | Status: DC
  Administered 2023-04-23 – 2023-04-25 (×3): 1000 [IU] via ORAL
  Filled 2023-04-22 (×3): qty 1

## 2023-04-22 MED ORDER — HEPARIN BOLUS VIA INFUSION
5000.0000 [IU] | Freq: Once | INTRAVENOUS | Status: AC
Start: 2023-04-22 — End: 2023-04-22
  Administered 2023-04-22: 5000 [IU] via INTRAVENOUS

## 2023-04-22 MED ORDER — SENNOSIDES-DOCUSATE SODIUM 8.6-50 MG PO TABS: 1.0000 | ORAL_TABLET | Freq: Every evening | ORAL | Status: DC | PRN

## 2023-04-22 MED ORDER — SODIUM CHLORIDE 0.9 % IV BOLUS
1000.0000 mL | Freq: Once | INTRAVENOUS | Status: AC
  Administered 2023-04-22: 1000 mL via INTRAVENOUS

## 2023-04-22 MED ORDER — LATANOPROST 0.005 % OP SOLN
1.0000 [drp] | Freq: Every day | OPHTHALMIC | Status: DC
  Administered 2023-04-22 – 2023-04-24 (×2): 1 [drp] via OPHTHALMIC
  Filled 2023-04-22: qty 2.5

## 2023-04-22 MED ORDER — ACETAMINOPHEN 500 MG PO TABS
1000.0000 mg | ORAL_TABLET | Freq: Once | ORAL | Status: AC
  Administered 2023-04-22: 1000 mg via ORAL
  Filled 2023-04-22: qty 2

## 2023-04-22 MED ORDER — POTASSIUM CHLORIDE CRYS ER 20 MEQ PO TBCR
40.0000 meq | EXTENDED_RELEASE_TABLET | Freq: Once | ORAL | Status: AC
  Administered 2023-04-22: 40 meq via ORAL
  Filled 2023-04-22: qty 2

## 2023-04-22 MED ORDER — HEPARIN (PORCINE) 25000 UT/250ML-% IV SOLN
2250.0000 [IU]/h | INTRAVENOUS | Status: DC
  Administered 2023-04-22: 1350 [IU]/h via INTRAVENOUS
  Administered 2023-04-23: 1950 [IU]/h via INTRAVENOUS
  Administered 2023-04-23: 1650 [IU]/h via INTRAVENOUS
  Filled 2023-04-22 (×3): qty 250

## 2023-04-22 MED ORDER — B COMPLEX-C PO TABS
1.0000 | ORAL_TABLET | Freq: Every day | ORAL | Status: DC
  Administered 2023-04-23 – 2023-04-25 (×3): 1 via ORAL
  Filled 2023-04-22 (×3): qty 1

## 2023-04-22 MED ORDER — PIPERACILLIN-TAZOBACTAM 3.375 G IVPB
3.3750 g | Freq: Three times a day (TID) | INTRAVENOUS | Status: DC
  Administered 2023-04-22 – 2023-04-25 (×8): 3.375 g via INTRAVENOUS
  Filled 2023-04-22 (×8): qty 50

## 2023-04-22 MED ORDER — UMECLIDINIUM BROMIDE 62.5 MCG/ACT IN AEPB
1.0000 | INHALATION_SPRAY | Freq: Every day | RESPIRATORY_TRACT | Status: DC
  Administered 2023-04-24 – 2023-04-25 (×2): 1 via RESPIRATORY_TRACT
  Filled 2023-04-22 (×2): qty 7

## 2023-04-22 MED ORDER — ONDANSETRON HCL 4 MG PO TABS: 4.0000 mg | ORAL_TABLET | Freq: Four times a day (QID) | ORAL | Status: DC | PRN

## 2023-04-22 MED ORDER — LORATADINE 10 MG PO TABS
10.0000 mg | ORAL_TABLET | Freq: Every day | ORAL | Status: DC
  Administered 2023-04-23 – 2023-04-25 (×3): 10 mg via ORAL
  Filled 2023-04-22 (×3): qty 1

## 2023-04-22 MED ORDER — ONDANSETRON HCL 4 MG/2ML IJ SOLN: 4.0000 mg | Freq: Four times a day (QID) | INTRAMUSCULAR | Status: DC | PRN

## 2023-04-22 MED ORDER — IOHEXOL 350 MG/ML SOLN
100.0000 mL | Freq: Once | INTRAVENOUS | Status: AC | PRN
  Administered 2023-04-22: 100 mL via INTRAVENOUS

## 2023-04-22 NOTE — ED Notes (Signed)
Both sets of blood cultures were drawn before any antibiotic administration

## 2023-04-22 NOTE — ED Notes (Signed)
Patient transported to CT

## 2023-04-22 NOTE — ED Provider Notes (Signed)
Care of patient assumed from Dr. Lorrine Kin.  This patient presents with fever of unknown origin.  Currently, fever has resolved after Tylenol.  Lab work is notable for leukocytosis and mild elevation in hepatobiliary enzymes.  Currently awaiting urinalysis and right upper quadrant ultrasound. Physical Exam  BP 102/64 (BP Location: Right Arm)   Pulse 89   Temp 98.5 F (36.9 C) (Oral)   Resp 19   SpO2 97%   Physical Exam Vitals and nursing note reviewed.  Constitutional:      General: He is not in acute distress.    Appearance: Normal appearance. He is well-developed. He is not ill-appearing, toxic-appearing or diaphoretic.  HENT:     Head: Normocephalic and atraumatic.     Right Ear: External ear normal.     Left Ear: External ear normal.     Nose: Nose normal.     Mouth/Throat:     Mouth: Mucous membranes are moist.  Eyes:     Extraocular Movements: Extraocular movements intact.     Conjunctiva/sclera: Conjunctivae normal.  Cardiovascular:     Rate and Rhythm: Normal rate and regular rhythm.  Pulmonary:     Effort: Pulmonary effort is normal. No respiratory distress.  Abdominal:     General: There is no distension.     Palpations: Abdomen is soft.     Tenderness: There is no abdominal tenderness.  Musculoskeletal:        General: No swelling. Normal range of motion.     Cervical back: Normal range of motion and neck supple.  Skin:    General: Skin is warm and dry.     Coloration: Skin is not jaundiced or pale.  Neurological:     General: No focal deficit present.     Mental Status: He is alert and oriented to person, place, and time.  Psychiatric:        Mood and Affect: Mood normal.        Behavior: Behavior normal.     Procedures  Procedures  ED Course / MDM    Medical Decision Making Amount and/or Complexity of Data Reviewed Labs: ordered. Radiology: ordered.  Risk OTC drugs. Prescription drug management. Decision regarding hospitalization.   On  assessment, patient resting comfortably.  He has no current physical complaints.  Abdomen is soft and nontender.  Ultrasound not show any gallstones.  There was a small right liver cyst and partially visualized renal cyst.  CT imaging was ordered for further evaluation.  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  Given absence of any other sources of infection, patient was treated medically for pyophlebitis with Zosyn.  I discussed with gastroenterologist on-call, Dr. Levon Hedger, who agrees with initiation of heparin in the absence of underlying cirrhosis.  Heparin was ordered.  Patient was admitted for further management.       Gloris Manchester, MD 04/22/23 2034

## 2023-04-22 NOTE — ED Notes (Signed)
MD made aware of temperature of 101.2 F

## 2023-04-22 NOTE — H&P (Signed)
History and Physical  Randy Russo ZOX:[id number] DOB: 28-Jun-1940 DOA: 04/22/2023  PCP: Assunta Found, MD   Chief Complaint: Generalized weakness, fever  HPI: Randy Russo is a 83 y.o. male with medical history significant for generalized deconditioning, GI bleed, COPD, HTN, recurrent falls and osteoarthritis who presented to the ED via EMS from SNF, [facility], for evaluation of fever and generalized bodyaches.  On evaluation, patient reports feeling weak, generalized muscle aches and stiff.  He reports a chronic nonproductive cough from his COPD but denies any shortness of breath, abdominal pain, chest pain, headache, dizziness, dysuria, melena, nausea, vomiting, hematuria or back pain.  ED Course: Initial vitals showed temp 101.2, RR 18, HR 95, BP 108/62 and SpO2 96% on room air. Labs significant for WBC 13.2, Hgb 10.6, platelet 231, K+ 3.3, glucose 125, creatinine 0.95, albumin 2.4, AST/ALT 183/177, bilirubin 2.5, lactic acid 1.8-1.0, ESR 80, negative flu, RSV and COVID test, UA shows mild hemoglobinuria and proteinuria but no signs of infection, PT/INR 15.7/1.2, acetaminophen level 39.  CXR with minimal bibasilar atelectasis but no acute abnormalities. RUQ U/S with small right liver cyst but no gallstones. CT A/P shows small volume nonocclusive thrombus in the main portal vein CTA chest PE study negative for PE.  Patient received IV Tylenol 1 g, IV NS 1 L bolus and oral potassium 40 mEq x 1. Patient was started on IV Zosyn and heparin infusion and GI was consulted for evaluation. TRH consulted for admission  Review of Systems: Please see HPI for pertinent positives and negatives. A complete 10 system review of systems are otherwise negative.  Past Medical History:  Diagnosis Date   Arthritis    Cancer [facility])    Prostate   COPD (chronic obstructive pulmonary disease) (HCC)    Diverticulitis    Hypertension    Lower GI bleed 12/27/2017   Past Surgical History:  Procedure  Laterality Date   CATARACT EXTRACTION W/PHACO Left 11/16/2012   Procedure: CATARACT EXTRACTION PHACO AND INTRAOCULAR LENS PLACEMENT (IOC);  Surgeon: Gemma Payor, MD;  Location: [facility];  Service: Ophthalmology;  Laterality: Left;  CDE: 13.66   CATARACT EXTRACTION W/PHACO Right 01/18/2013   Procedure: CATARACT EXTRACTION PHACO AND INTRAOCULAR LENS PLACEMENT (IOC);  Surgeon: Gemma Payor, MD;  Location: [facility];  Service: Ophthalmology;  Laterality: Right;  CDE:8.79   CERVICAL FUSION  4 yrs ago   COLONOSCOPY N/A 12/28/2017   Procedure: COLONOSCOPY;  Surgeon: Malissa Hippo, MD;  Location: [facility];  Service: Endoscopy;  Laterality: N/A;   COLONOSCOPY N/A 01/01/2018   Procedure: COLONOSCOPY;  Surgeon: Malissa Hippo, MD;  Location: [facility];  Service: Endoscopy;  Laterality: N/A;   ESOPHAGOGASTRODUODENOSCOPY N/A 01/01/2018   Procedure: ESOPHAGOGASTRODUODENOSCOPY (EGD);  Surgeon: Malissa Hippo, MD;  Location: [facility];  Service: Endoscopy;  Laterality: N/A;   INGUINAL HERNIA REPAIR Right 03/12/2021   Procedure: HERNIA REPAIR INGUINAL ADULT;  Surgeon: Franky Macho, MD;  Location: [facility];  Service: General;  Laterality: Right;   JOINT REPLACEMENT Left 5 yrs ago   knee   PARTIAL COLECTOMY N/A 01/01/2018   Procedure: PARTIAL COLECTOMY;  Surgeon: Franky Macho, MD;  Location: [facility];  Service: General;  Laterality: N/A;   right hip replacement Right 3 yrs ago   Social History:  reports that he has quit smoking. His smoking use included cigars. He has never used smokeless tobacco. He reports that he does not drink alcohol and does not use drugs.  No Known Allergies  Family History  Problem Relation Age of Onset   Diabetes Mother    Dementia Father      Prior to Admission medications   Medication Sig Start Date End Date Taking? Authorizing Provider  albuterol (PROVENTIL) (2.5 MG/3ML) 0.083% nebulizer solution Take 3 mLs (2.5 mg total) by nebulization every 6 (six) hours as needed  for wheezing or shortness of breath. 04/14/23   Vassie Loll, MD  B Complex-C (B-COMPLEX WITH VITAMIN C) tablet Take 1 tablet by mouth daily. 04/15/23   Vassie Loll, MD  Cholecalciferol 25 MCG (1000 UT) capsule Take 1 capsule (1,000 Units total) by mouth daily. 04/14/23   Vassie Loll, MD  furosemide (LASIX) 20 MG tablet Take 20 mg by mouth daily.    [provider]  loratadine (CLARITIN) 10 MG tablet Take 1 tablet (10 mg total) by mouth daily. 04/15/23   Vassie Loll, MD  LUMIGAN 0.01 % SOLN Place 1 drop into both eyes at bedtime. 04/14/23   Vassie Loll, MD  Omega-3 Fatty Acids (FISH OIL) 1200 MG CAPS Take 1 capsule (1,200 mg total) by mouth daily. 04/14/23   Vassie Loll, MD  umeclidinium bromide (INCRUSE ELLIPTA) 62.5 MCG/ACT AEPB Inhale 1 puff into the lungs daily. 04/15/23   Vassie Loll, MD    Physical Exam: BP (!) 129/99 (BP Location: Right Arm)   Pulse 93   Temp 98.4 F (36.9 C) (Oral)   Resp 20   SpO2 95%  General: Pleasant, chronically ill-appearing elderly man laying in bed. No acute distress. HEENT: [place]/AT. Anicteric sclera. Mild dry mucous membrane. CV: RRR. No murmurs, rubs, or gallops. No LE edema Pulmonary: Lungs CTAB. Normal effort. No wheezing or rales. Abdominal: Soft, nontender, nondistended. Normal bowel sounds. Extremities: Palpable radial and DP pulses. Normal ROM. Skin: Warm and dry. No obvious rash or lesions. Neuro: A&Ox3. Moves all extremities. Normal sensation to light touch. No focal deficit. Psych: Normal mood and affect          Labs on Admission:  Basic Metabolic Panel: Recent Labs  Lab 04/22/23 1439  NA 135  K 3.3*  CL 103  CO2 21*  GLUCOSE 125*  BUN 22  CREATININE 0.95  CALCIUM 8.6*   Liver Function Tests: Recent Labs  Lab 04/22/23 1439  AST 183*  ALT 177*  ALKPHOS 84  BILITOT 2.5*  PROT 7.2  ALBUMIN 2.4*   No results for input(s): "LIPASE", "AMYLASE" in the last 168 hours. No results for input(s): "AMMONIA" in  the last 168 hours. CBC: Recent Labs  Lab 04/22/23 1439  WBC 13.2*  HGB 10.6*  HCT 33.6*  MCV 82.2  PLT 231   Cardiac Enzymes: No results for input(s): "CKTOTAL", "CKMB", "CKMBINDEX", "TROPONINI" in the last 168 hours. BNP (last 3 results) Recent Labs    04/12/23 1822  BNP 58.0    ProBNP (last 3 results) No results for input(s): "PROBNP" in the last 8760 hours.  CBG: No results for input(s): "GLUCAP" in the last 168 hours.  Radiological Exams on Admission: CT ABDOMEN PELVIS W CONTRAST Result Date: 04/22/2023 CLINICAL DATA:  Acute abdominal pain. Patient reports fever and body aches. EXAM: CT ABDOMEN AND PELVIS WITH CONTRAST TECHNIQUE: Multidetector CT imaging of the abdomen and pelvis was performed using the standard protocol following bolus administration of intravenous contrast. RADIATION DOSE REDUCTION: This exam was performed according to the departmental dose-optimization program which includes automated exposure control, adjustment of the mA and/or kV according to patient size and/or use of iterative reconstruction technique. CONTRAST:  OMNIPAQUE IOHEXOL 350 MG/ML SOLN COMPARISON:  CT 01/31/2021 FINDINGS: Lower chest: Assessed on concurrent chest CTA, reported separately. Hepatobiliary: Benign cyst in the subcapsular right lobe of the liver. No suspicious liver lesion. Unremarkable gallbladder. There is mild motion artifact which limits detailed assessment. Pancreas: Parenchymal atrophy. No ductal dilatation or inflammation. Spleen: Normal in size without focal abnormality. Adrenals/Urinary Tract: No adrenal nodule. No hydronephrosis or perinephric edema. Homogeneous renal enhancement with symmetric excretion on delayed phase imaging. There are multiple bilateral renal cysts. The largest cyst is in the right mid upper kidney measuring 10.5 cm. No further follow-up imaging is recommended. Urinary bladder is nondistended and not well assessed due to streak artifact from right  hip arthroplasty. Stomach/Bowel: Midline ventral abdominal wall hernia contains short segment of small bowel. There is no associated wall thickening or obstruction. Right hemicolectomy. Colonic diverticulosis without diverticulitis. Moderate volume of colonic stool. Previous right inguinal hernia has been repaired. Vascular/Lymphatic: Aortic atherosclerosis without aneurysm. 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 No enlarged lymph nodes in the abdomen or pelvis. Reproductive: Fiducial markers or clips in the prostate. Other: Postsurgical change in the right inguinal canal, previous right inguinal hernia has been repaired. Midline ventral abdominal wall hernia contains short segment of small bowel without inflammation or obstruction. No ascites or free air. Musculoskeletal: Right hip arthroplasty. Chronic left hip arthropathy with flattening of the femoral head, near complete joint space loss, subchondral cystic change and sclerosis. This is similar in appearance to 11/27/2022 pelvis radiograph. There is a left hip joint effusion. Multilevel degenerative change in the lumbar spine. IMPRESSION: 1. 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 2. Midline ventral abdominal wall hernia contains short segment of small bowel without inflammation or obstruction. 3. Colonic diverticulosis without diverticulitis. 4. Chronic left hip arthropathy with flattening of the femoral head, near complete joint space loss, subchondral cystic change and sclerosis. There is a left hip joint effusion. 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 Clinical correlation is recommended in the setting of fever for hip infection, although chronic changes were seen on September 2024 radiograph and appear similar. Aortic Atherosclerosis (ICD10-I70.0). These results were called by telephone at the time of interpretation on 04/22/2023 at 6:57 pm to provider Gloris Manchester , who  verbally acknowledged these results. Electronically Signed   By: Narda Rutherford M.D.   On: 04/22/2023 18:58   CT Angio Chest PE W and/or Wo Contrast Result Date: 04/22/2023 CLINICAL DATA:  Pulmonary embolism (PE) suspected, high prob Patient presents with fever and body aches. EXAM: CT ANGIOGRAPHY CHEST WITH CONTRAST TECHNIQUE: Multidetector CT imaging of the chest was performed using the standard protocol during bolus administration of intravenous contrast. Multiplanar CT image reconstructions and MIPs were obtained to evaluate the vascular anatomy. Performed in conjunction with CT of the abdomen and pelvis. RADIATION DOSE REDUCTION: This exam was performed according to the departmental dose-optimization program which includes automated exposure control, adjustment of the mA and/or kV according to patient size and/or use of iterative reconstruction technique. CONTRAST:  OMNIPAQUE IOHEXOL 350 MG/ML SOLN COMPARISON:  Chest radiograph earlier today. FINDINGS: Cardiovascular: There are no filling defects within the pulmonary arteries to suggest pulmonary embolus. Dilated main pulmonary artery at 3.7 cm. The thoracic aorta is tortuous. No acute aortic finding. The heart is upper normal in size. No pericardial effusion. Mediastinum/Nodes: No mediastinal or hilar adenopathy. Unremarkable esophagus. No thyroid nodule. Lungs/Pleura: Mild lower lobe bronchial thickening. Bandlike right lower  lobe opacities favor atelectasis. There is no confluent airspace disease. No pleural fluid. No pulmonary mass or suspicious nodule. No features of pulmonary edema. Trachea and central most airways are clear. Upper Abdomen: Assessed on concurrent abdominopelvic CT, reported separately. Musculoskeletal: Diffuse thoracic spondylosis with anterior spurring, bulky anterior osteophytes in the lower cervical spine. Mild broad-based dextroscoliotic curvature. The right shoulder is dislocated with the humeral head directed anteriorly  and flattening of the glenoid. There are erosive changes involving the shoulder joint which are suboptimally assessed this is a change from 11/05/2021 shoulder CT. Review of the MIP images confirms the above findings. IMPRESSION: 1. No pulmonary embolus. 2. Mild lower lobe bronchial thickening, can be seen with bronchitis or reactive airways disease. 3. Right shoulder dislocation with the humeral head directed anteriorly and flattening of the glenoid. There are erosive changes involving the shoulder joint which are suboptimally assessed. This is a change from 11/05/2021 shoulder CT. Recommend correlation with history of shoulder pain. 4. Dilated main pulmonary artery, can be seen with pulmonary arterial hypertension. Electronically Signed   By: Narda Rutherford M.D.   On: 04/22/2023 18:47   US Abdomen Limited RUQ (LIVER/GB) Result Date: 04/22/2023 CLINICAL DATA:  Fever. EXAM: ULTRASOUND ABDOMEN LIMITED RIGHT UPPER QUADRANT COMPARISON:  CT of the abdomen pelvis dated 01/31/2021. FINDINGS: Gallbladder: No gallstones or wall thickening visualized. No sonographic Murphy sign noted by sonographer. Common bile duct: Diameter: 7 mm Liver: The liver demonstrates a normal echogenicity. There is a 1.8 x 1.3 x 1.7 cm cyst in the right lobe of the liver also present on the prior CT of 01/31/2021. Portal vein is patent on color Doppler imaging with normal direction of blood flow towards the liver. Other: Partially visualized large right renal cyst measuring up to 11 cm. IMPRESSION: 1. No gallstone. 2. Small right liver cyst. 3. Partially visualized right renal cyst. Electronically Signed   By: Elgie Collard M.D.   On: 04/22/2023 17:27   DG Chest Port 1 View Result Date: 04/22/2023 CLINICAL DATA:  Cough EXAM: PORTABLE CHEST 1 VIEW COMPARISON:  X-ray 04/12/2023 FINDINGS: Slight linear opacity lung bases likely scar or atelectasis. No consolidation, pneumothorax or effusion. No edema. Normal cardiopericardial silhouette.  Tortuous ectatic aorta. Degenerative changes of the spine with curvature. Fixation hardware along the lower cervical spine. IMPRESSION: Minimal basilar atelectasis.  Tortuous ectatic aorta. Electronically Signed   By: Karen Kays M.D.   On: 04/22/2023 13:22   Assessment/Plan SANTI TROUNG is a 83 y.o. 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  # Portal vein thrombosis # Fever Chronically ill elderly patient presented with fever and generalized weakness and muscle aches found to have thrombosis of his portal vein. He is febrile on arrival with leukocytosis. No evidence of respiratory, skin or urinary infection. LFTs elevated but no radiological abnormalities in the liver or biliary tract. Septic thrombophlebitis is high on my differential in the setting of systemic symptoms, elevated WBC, ESR and evidence of portal vein thrombosis in this elderly gentleman. -GI consulted, appreciate recs -Continue IV Zosyn -Continue heparin drip -Follow-up blood cultures and CRP -Trend CBC, fever curve  # Generalized weakness Patient with history of generalized deconditioning presented with generalized weakness in the setting of acute infection. -PT/OT eval and treat -Continue vitamin supplementation  # Elevated LFTs #  Elevated bilirubin CT does not show any abnormalities of the liver or biliary system. Acetaminophen level slightly elevated to 39 however his elevated LFTs are likely due to thrombus in his portal vein. -GI consulted, appreciate recs -Trend LFTs -Follow-up acute hepatitis labs  # COPD No respiratory distress or wheezing on exam -Continue Incruse Ellipta and Claritin -As needed albuterol nebs   DVT prophylaxis: Heparin    Code Status: Limited: Do not  attempt resuscitation (DNR) -DNR-LIMITED -Do Not Intubate/DNI   Consults called: Gastroenterology  Family Communication: No family at bedside  Severity of Illness: The appropriate patient status for this patient is INPATIENT. Inpatient status is judged to be reasonable and necessary in order to provide the required intensity of service to ensure the patient's safety. The patient's presenting symptoms, physical exam findings, and initial radiographic and laboratory data in the context of their chronic comorbidities is felt to place them at high risk for further clinical deterioration. Furthermore, it is not anticipated that the patient will be medically stable for discharge from the hospital within 2 midnights of admission.   * I certify that at the point of admission it is my clinical judgment that the patient will require inpatient hospital care spanning beyond 2 midnights from the point of admission due to high intensity of service, high risk for further deterioration and high frequency of surveillance required.*  Level of care: Telemetry   Steffanie Rainwater, MD 04/22/2023, 11:11 PM [facility] Pager: [phone] Isaiah 41:10   If 7PM-7AM, please contact night-coverage [email or web address] Password [email or web address]

## 2023-04-22 NOTE — ED Notes (Signed)
Pt also endorses chills

## 2023-04-22 NOTE — ED Notes (Signed)
Pt tolerated fluids well.

## 2023-04-22 NOTE — ED Triage Notes (Signed)
Pt arrived via [facility] from [facility] c/o fever and generalized body aches that started this morning. SpO2 on RA in the ED is currently 95%. Temperature at this time 101.2 F. Pt does not have any other complaints

## 2023-04-22 NOTE — Discharge Instructions (Addendum)
It was our pleasure to provide your ER care today - we hope that you feel better.  Drink plenty of fluids/stay well hydrated. Follow up closely with your doctor in the coming week.   From today's labs, a few of your liver function tests are elevated - follow up closely with primary care doctor in the coming week.   Return to ER right away if worse, new symptoms, new/severe pain, new/severe abdominal pain, swelling/redness, trouble breathing, weak/faint, or other concern.   Information on my medicine - ELIQUIS (apixaban)  This medication education was reviewed with me or my healthcare representative as part of my discharge preparation.    Why was Eliquis prescribed for you? Eliquis was prescribed to treat blood clots that may have been found in the veins of your legs (deep vein thrombosis) or in your lungs (pulmonary embolism) and to reduce the risk of them occurring again.  What do You need to know about Eliquis ? The starting dose is 10 mg (two 5 mg tablets) taken TWICE daily for the FIRST SEVEN (7) DAYS, then the dose is reduced to ONE 5 mg tablet taken TWICE daily.  Eliquis may be taken with or without food.   Try to take the dose about the same time in the morning and in the evening. If you have difficulty swallowing the tablet whole please discuss with your pharmacist how to take the medication safely.  Take Eliquis exactly as prescribed and DO NOT stop taking Eliquis without talking to the doctor who prescribed the medication.  Stopping may increase your risk of developing a new blood clot.  Refill your prescription before you run out.  After discharge, you should have regular check-up appointments with your healthcare provider that is prescribing your Eliquis.    What do you do if you miss a dose? If a dose of ELIQUIS is not taken at the scheduled time, take it as soon as possible on the same day and twice-daily administration should be resumed. The dose should not be doubled  to make up for a missed dose.  Important Safety Information A possible side effect of Eliquis is bleeding. You should call your healthcare provider right away if you experience any of the following: Bleeding from an injury or your nose that does not stop. Unusual colored urine (red or dark brown) or unusual colored stools (red or black). Unusual bruising for unknown reasons. A serious fall or if you hit your head (even if there is no bleeding).  Some medicines may interact with Eliquis and might increase your risk of bleeding or clotting while on Eliquis. To help avoid this, consult your healthcare provider or pharmacist prior to using any new prescription or non-prescription medications, including herbals, vitamins, non-steroidal anti-inflammatory drugs (NSAIDs) and supplements.  This website has more information on Eliquis (apixaban): http://www.eliquis.com/eliquis/home

## 2023-04-22 NOTE — Consult Note (Signed)
PHARMACY - ANTICOAGULATION CONSULT NOTE  Pharmacy Consult for Heparin Indication:  portal vein thrombosis  No Known Allergies  Patient Measurements: Heparin Dosing Weight: 83.6 kg  Vital Signs: Temp: 98.5 F (36.9 C) (01/28 1522) Temp Source: Oral (01/28 1522) BP: 112/74 (01/28 1815) Pulse Rate: 84 (01/28 1815)  Labs: Recent Labs    04/22/23 1439  HGB 10.6*  HCT 33.6*  PLT 231  CREATININE 0.95   Estimated Creatinine Clearance: 63.9 mL/min (by C-G formula based on SCr of 0.95 mg/dL).  Medical History: Past Medical History:  Diagnosis Date   Arthritis    Cancer (HCC)    Prostate   COPD (chronic obstructive pulmonary disease) (HCC)    Diverticulitis    Hypertension    Lower GI bleed 12/27/2017   Medications:  No anticoagulation per chart review  Baseline Labs Hgb 10.6 Plt 231 INR/aPTT - ordered  Assessment: Randy Russo is a 83 yo male who presented from [facility] with fever and generalized body aches that start this morning. Imaging negative for PE. Imaging of abdomen shows small volume nonocclusive thrombus in the main portal vein. Pharmacy has been consulted to manage this patient heparin.   Goal of Therapy:  Heparin level 0.3-0.7 units/ml Monitor platelets by anticoagulation protocol: Yes   Plan:  Give 5000 units bolus x 1 Start heparin infusion at 1350 units/hr Check anti-Xa level in 8 hours and daily while on heparin Continue to monitor H&H and platelets  Thank you for allowing pharmacy to participate in this patient's care.   Effie Shy, PharmD Pharmacy Resident  04/22/2023 7:39 PM

## 2023-04-22 NOTE — ED Provider Notes (Signed)
[place] EMERGENCY DEPARTMENT AT [facility] Provider Note   CSN: [id number] Arrival date & time: 04/22/23  1206     History  Chief Complaint  Patient presents with   Fever    Randy Russo is a 83 y.o. male.  Pt with fever, arrives via EMS from [facility].  Pt indicates occasional non prod cough, mild body aches, but denies other specific complaints. No headache. No sore throat, no sinus drainage or pain. No chest pain or discomfort. No sob. No abd pain or nvd. No dysuria or gu c/o. No extremity pain or swelling.   The history is provided by the patient, medical records and the EMS personnel. The history is limited by the condition of the patient.  Fever Associated symptoms: cough   Associated symptoms: no chest pain, no diarrhea, no dysuria, no headaches, no rash, no sore throat and no vomiting        Home Medications Prior to Admission medications   Medication Sig Start Date End Date Taking? Authorizing Provider  albuterol (PROVENTIL) (2.5 MG/3ML) 0.083% nebulizer solution Take 3 mLs (2.5 mg total) by nebulization every 6 (six) hours as needed for wheezing or shortness of breath. 04/14/23   Vassie Loll, MD  B Complex-C (B-COMPLEX WITH VITAMIN C) tablet Take 1 tablet by mouth daily. 04/15/23   Vassie Loll, MD  Cholecalciferol 25 MCG (1000 UT) capsule Take 1 capsule (1,000 Units total) by mouth daily. 04/14/23   Vassie Loll, MD  furosemide (LASIX) 20 MG tablet Take 20 mg by mouth daily.    [provider]  loratadine (CLARITIN) 10 MG tablet Take 1 tablet (10 mg total) by mouth daily. 04/15/23   Vassie Loll, MD  LUMIGAN 0.01 % SOLN Place 1 drop into both eyes at bedtime. 04/14/23   Vassie Loll, MD  Omega-3 Fatty Acids (FISH OIL) 1200 MG CAPS Take 1 capsule (1,200 mg total) by mouth daily. 04/14/23   Vassie Loll, MD  umeclidinium bromide (INCRUSE ELLIPTA) 62.5 MCG/ACT AEPB Inhale 1 puff into the lungs daily. 04/15/23   Vassie Loll, MD       Allergies    Patient has no known allergies.    Review of Systems   Review of Systems  Constitutional:  Positive for fever.  HENT:  Negative for sinus pain and sore throat.   Eyes:  Negative for discharge and redness.  Respiratory:  Positive for cough. Negative for shortness of breath.   Cardiovascular:  Negative for chest pain and leg swelling.  Gastrointestinal:  Negative for abdominal pain, diarrhea and vomiting.  Genitourinary:  Negative for dysuria, flank pain, scrotal swelling and testicular pain.  Musculoskeletal:  Negative for back pain, neck pain and neck stiffness.  Skin:  Negative for rash.  Neurological:  Negative for headaches.    Physical Exam Updated Vital Signs BP 102/64 (BP Location: Right Arm)   Pulse 89   Temp 98.5 F (36.9 C) (Oral)   Resp 19   SpO2 97%  Physical Exam Vitals and nursing note reviewed.  Constitutional:      Appearance: Normal appearance. He is well-developed.  HENT:     Head: Atraumatic.     Right Ear: Tympanic membrane normal.     Left Ear: Tympanic membrane normal.     Nose: Nose normal.     Mouth/Throat:     Mouth: Mucous membranes are moist.     Pharynx: Oropharynx is clear. No oropharyngeal exudate or posterior oropharyngeal erythema.  Eyes:  General: No scleral icterus.    Conjunctiva/sclera: Conjunctivae normal.     Pupils: Pupils are equal, round, and reactive to light.  Neck:     Trachea: No tracheal deviation.     Comments: No stiffness or rigidity.  Cardiovascular:     Rate and Rhythm: Normal rate and regular rhythm.     Pulses: Normal pulses.     Heart sounds: Normal heart sounds. No murmur heard.    No friction rub. No gallop.  Pulmonary:     Effort: Pulmonary effort is normal. No accessory muscle usage or respiratory distress.     Breath sounds: Normal breath sounds.  Abdominal:     General: Bowel sounds are normal. There is no distension.     Palpations: Abdomen is soft.     Tenderness: There is no  abdominal tenderness. There is no guarding.  Genitourinary:    Comments: No cva tenderness. Normal external gu exam, no infection noted.  Musculoskeletal:        General: No swelling or tenderness.     Cervical back: Normal range of motion and neck supple. No rigidity.     Right lower leg: No edema.     Left lower leg: No edema.     Comments: CTLS spine, non tender, aligned, no step off. Good rom bil extremities without pain or focal bony tenderness.   Lymphadenopathy:     Cervical: No cervical adenopathy.  Skin:    General: Skin is warm and dry.     Findings: No rash.  Neurological:     Mental Status: He is alert.     Comments: Alert, speech clear. Motor/sens grossly intact bil.   Psychiatric:        Mood and Affect: Mood normal.     ED Results / Procedures / Treatments   Labs (all labs ordered are listed, but only abnormal results are displayed) Results for orders placed or performed during the hospital encounter of 04/22/23  Resp panel by RT-PCR (RSV, Flu A&B, Covid) Anterior Nasal Swab   Collection Time: 04/22/23 12:44 PM   Specimen: Anterior Nasal Swab  Result Value Ref Range   SARS Coronavirus 2 by RT PCR NEGATIVE NEGATIVE   Influenza A by PCR NEGATIVE NEGATIVE   Influenza B by PCR NEGATIVE NEGATIVE   Resp Syncytial Virus by PCR NEGATIVE NEGATIVE  Lactic acid, plasma   Collection Time: 04/22/23  2:23 PM  Result Value Ref Range   Lactic Acid, Venous 1.8 0.5 - 1.9 mmol/L  CBC   Collection Time: 04/22/23  2:39 PM  Result Value Ref Range   WBC 13.2 (H) 4.0 - 10.5 K/uL   RBC 4.09 (L) 4.22 - 5.81 MIL/uL   Hemoglobin 10.6 (L) 13.0 - 17.0 g/dL   HCT 16.1 (L) 09.6 - 04.5 %   MCV 82.2 80.0 - 100.0 fL   MCH 25.9 (L) 26.0 - 34.0 pg   MCHC 31.5 30.0 - 36.0 g/dL   RDW 40.9 (H) 81.1 - 91.4 %   Platelets 231 150 - 400 K/uL   nRBC 0.0 0.0 - 0.2 %  Comprehensive metabolic panel   Collection Time: 04/22/23  2:39 PM  Result Value Ref Range   Sodium 135 135 - 145 mmol/L    Potassium 3.3 (L) 3.5 - 5.1 mmol/L   Chloride 103 98 - 111 mmol/L   CO2 21 (L) 22 - 32 mmol/L   Glucose, Bld 125 (H) 70 - 99 mg/dL   BUN 22 8 - 23 mg/dL  Creatinine, Ser 0.95 0.61 - 1.24 mg/dL   Calcium 8.6 (L) 8.9 - 10.3 mg/dL   Total Protein 7.2 6.5 - 8.1 g/dL   Albumin 2.4 (L) 3.5 - 5.0 g/dL   AST 161 (H) 15 - 41 U/L   ALT 177 (H) 0 - 44 U/L   Alkaline Phosphatase 84 38 - 126 U/L   Total Bilirubin 2.5 (H) 0.0 - 1.2 mg/dL   GFR, Estimated >09 >60 mL/min   Anion gap 11 5 - 15   DG Chest Port 1 View Result Date: 04/22/2023 CLINICAL DATA:  Cough EXAM: PORTABLE CHEST 1 VIEW COMPARISON:  X-ray 04/12/2023 FINDINGS: Slight linear opacity lung bases likely scar or atelectasis. No consolidation, pneumothorax or effusion. No edema. Normal cardiopericardial silhouette. Tortuous ectatic aorta. Degenerative changes of the spine with curvature. Fixation hardware along the lower cervical spine. IMPRESSION: Minimal basilar atelectasis.  Tortuous ectatic aorta. Electronically Signed   By: Karen Kays M.D.   On: 04/22/2023 13:22   DG Chest Port 1 View Result Date: 04/12/2023 CLINICAL DATA:  Short of breath, lower extremity edema EXAM: PORTABLE CHEST 1 VIEW COMPARISON:  11/29/2022 FINDINGS: Single frontal view of the chest demonstrates a stable cardiac silhouette. Continued ectasia of the thoracic aorta. No airspace disease, effusion, or pneumothorax. No acute bony abnormality. IMPRESSION: 1. No acute intrathoracic process. Electronically Signed   By: Sharlet Salina M.D.   On: 04/12/2023 18:43     EKG None  Radiology DG Chest Port 1 View Result Date: 04/22/2023 CLINICAL DATA:  Cough EXAM: PORTABLE CHEST 1 VIEW COMPARISON:  X-ray 04/12/2023 FINDINGS: Slight linear opacity lung bases likely scar or atelectasis. No consolidation, pneumothorax or effusion. No edema. Normal cardiopericardial silhouette. Tortuous ectatic aorta. Degenerative changes of the spine with curvature. Fixation hardware along the  lower cervical spine. IMPRESSION: Minimal basilar atelectasis.  Tortuous ectatic aorta. Electronically Signed   By: Karen Kays M.D.   On: 04/22/2023 13:22    Procedures Procedures    Medications Ordered in ED Medications  acetaminophen (TYLENOL) tablet 1,000 mg (1,000 mg Oral Given 04/22/23 1408)    ED Course/ Medical Decision Making/ A&P                                 Medical Decision Making Problems Addressed: Acute febrile illness: acute illness or injury with systemic symptoms that poses a threat to life or bodily functions Elevated liver function tests: acute illness or injury  Amount and/or Complexity of Data Reviewed External Data Reviewed: notes. Labs: ordered. Decision-making details documented in ED Course. Radiology: ordered and independent interpretation performed. Decision-making details documented in ED Course.  Risk OTC drugs. Decision regarding hospitalization.   Iv ns. Continuous pulse ox and cardiac monitoring. Labs ordered/sent. Imaging ordered.   Differential diagnosis includes pna, flu, covid, other febrile illness, etc. Dispo decision including potential need for admission considered - will get labs and imaging and reassess.   Reviewed nursing notes and prior charts for additional history. External reports reviewed. Additional history from: EMS.   Cardiac monitor: sinus rhythm, rate 90.  Labs reviewed/interpreted by me - covid and flu neg. Additional labs added.  Wbc 13. Ast/alt/bili elevated compared to prior. Abd soft nt. Will get u/s uA pending. K mildly low. Kcl po. Lactate normal.   Xrays reviewed/interpreted by me - no pna.   1630, u/s, ua/labs pending - signed out to Dr Durwin Nora to check pending studies, recheck pt, and  dispo appropriately.            Final Clinical Impression(s) / ED Diagnoses Final diagnoses:  None    Rx / DC Orders ED Discharge Orders     None         Cathren Laine, MD 04/22/23 [phone]

## 2023-04-22 NOTE — ED Notes (Signed)
ED TO INPATIENT HANDOFF REPORT  ED Nurse Name and Phone #: Melanee Spry [phone]  S Name/Age/Gender Randy Russo 83 y.o. male Room/Bed: APA06/APA06  Code Status   Code Status: Limited: Do not attempt resuscitation (DNR) -DNR-LIMITED -Do Not Intubate/DNI   Home/SNF/Other Skilled nursing facility Patient oriented to: self, place, time, and situation Is this baseline? 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 SpO2 on RA in the ED is currently 95%. Temperature at this time 101.2 F. 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]  Admitting Physician: Steffanie Rainwater [[id number]]  Attending Physician: Steffanie Rainwater [[id number]]  Certification:: I certify this patient will need inpatient services for at least 2 midnights  Expected Medical Readiness: 04/24/2023          B Medical/Surgery History Past Medical History:  Diagnosis Date   Arthritis    Cancer [facility])    Prostate   COPD (chronic obstructive pulmonary disease) (HCC)    Diverticulitis    Hypertension    Lower GI bleed 12/27/2017   Past Surgical History:  Procedure Laterality Date   CATARACT EXTRACTION W/PHACO Left 11/16/2012   Procedure: CATARACT EXTRACTION PHACO AND INTRAOCULAR LENS PLACEMENT (IOC);  Surgeon: Gemma Payor, MD;  Location: [facility];  Service: Ophthalmology;  Laterality: Left;  CDE: 13.66   CATARACT EXTRACTION W/PHACO Right 01/18/2013   Procedure: CATARACT EXTRACTION PHACO AND INTRAOCULAR LENS  PLACEMENT (IOC);  Surgeon: Gemma Payor, MD;  Location: [facility];  Service: Ophthalmology;  Laterality: Right;  CDE:8.79   CERVICAL FUSION  4 yrs ago   COLONOSCOPY N/A 12/28/2017   Procedure: COLONOSCOPY;  Surgeon: Malissa Hippo, MD;  Location: [facility];  Service: Endoscopy;  Laterality: N/A;   COLONOSCOPY N/A 01/01/2018   Procedure: COLONOSCOPY;  Surgeon: Malissa Hippo, MD;  Location: [facility];  Service: Endoscopy;  Laterality: N/A;   ESOPHAGOGASTRODUODENOSCOPY N/A 01/01/2018   Procedure: ESOPHAGOGASTRODUODENOSCOPY (EGD);  Surgeon: Malissa Hippo, MD;  Location: [facility];  Service: Endoscopy;  Laterality: N/A;   INGUINAL HERNIA REPAIR Right 03/12/2021   Procedure: HERNIA REPAIR INGUINAL ADULT;  Surgeon: Franky Macho, MD;  Location: [facility];  Service: General;  Laterality: Right;   JOINT REPLACEMENT Left 5 yrs ago   knee   PARTIAL COLECTOMY N/A 01/01/2018   Procedure: PARTIAL COLECTOMY;  Surgeon: Franky Macho, MD;  Location: [facility];  Service: General;  Laterality: N/A;   right hip replacement Right 3 yrs ago     A IV Location/Drains/Wounds Patient Lines/Drains/Airways Status     Active Line/Drains/Airways     Name Placement date Placement time Site Days   Peripheral IV 04/22/23 20 G 1" Anterior;Distal;Left;Upper Arm 04/22/23  1427  Arm  less than 1   Pressure Injury 11/01/21 Rectum Right;Lower Stage 2 -  Partial thickness loss of dermis presenting as a shallow open injury with a red, pink wound bed without slough. 11/01/21  5366  -- 537  Intake/Output Last 24 hours No intake or output data in the 24 hours ending 04/22/23 2031  Labs/Imaging Results for orders placed or performed during the hospital encounter of 04/22/23 (from the past 48 hours)  Resp panel by RT-PCR (RSV, Flu A&B, Covid) Anterior Nasal Swab     Status: None   Collection Time: 04/22/23 12:44 PM   Specimen: Anterior Nasal Swab  Result Value Ref Range   SARS Coronavirus 2 by RT PCR  NEGATIVE NEGATIVE    Comment: (NOTE) SARS-CoV-2 target nucleic acids are NOT DETECTED.  The SARS-CoV-2 RNA is generally detectable in upper respiratory specimens during the acute phase of infection. The lowest concentration of SARS-CoV-2 viral copies this assay can detect is 138 copies/mL. A negative result does not preclude SARS-Cov-2 infection and should not be used as the sole basis for treatment or other patient management decisions. A negative result may occur with  improper specimen collection/handling, submission of specimen other than nasopharyngeal swab, presence of viral mutation(s) within the areas targeted by this assay, and inadequate number of viral copies(<138 copies/mL). A negative result must be combined with clinical observations, patient history, and epidemiological information. The expected result is Negative.  Fact Sheet for Patients:  BloggerCourse.com  Fact Sheet for Healthcare Providers:  SeriousBroker.it  This test is no t yet approved or cleared by the [place] FDA and  has been authorized for detection and/or diagnosis of SARS-CoV-2 by FDA under an Emergency Use Authorization (EUA). This EUA will remain  in effect (meaning this test can be used) for the duration of the COVID-19 declaration under Section 564(b)(1) of the Act, 21 U.S.C.section 360bbb-3(b)(1), unless the authorization is terminated  or revoked sooner.       Influenza A by PCR NEGATIVE NEGATIVE   Influenza B by PCR NEGATIVE NEGATIVE    Comment: (NOTE) The Xpert Xpress SARS-CoV-2/FLU/RSV plus assay is intended as an aid in the diagnosis of influenza from Nasopharyngeal swab specimens and should not be used as a sole basis for treatment. Nasal washings and aspirates are unacceptable for Xpert Xpress SARS-CoV-2/FLU/RSV testing.  Fact Sheet for Patients: BloggerCourse.com  Fact Sheet for Healthcare  Providers: SeriousBroker.it  This test is not yet approved or cleared by the [place] FDA and has been authorized for detection and/or diagnosis of SARS-CoV-2 by FDA under an Emergency Use Authorization (EUA). This EUA will remain in effect (meaning this test can be used) for the duration of the COVID-19 declaration under Section 564(b)(1) of the Act, 21 U.S.C. section 360bbb-3(b)(1), unless the authorization is terminated or revoked.     Resp Syncytial Virus by PCR NEGATIVE NEGATIVE    Comment: (NOTE) Fact Sheet for Patients: BloggerCourse.com  Fact Sheet for Healthcare Providers: SeriousBroker.it  This test is not yet approved or cleared by the [place] FDA and has been authorized for detection and/or diagnosis of SARS-CoV-2 by FDA under an Emergency Use Authorization (EUA). This EUA will remain in effect (meaning this test can be used) for the duration of the COVID-19 declaration under Section 564(b)(1) of the Act, 21 U.S.C. section 360bbb-3(b)(1), unless the authorization is terminated or revoked.  Performed at [facility], [address], [place], [place] [address]   Lactic acid, plasma     Status: None   Collection Time: 04/22/23  2:23 PM  Result Value Ref Range   Lactic Acid, Venous 1.8 0.5 - 1.9 mmol/L    Comment: Performed at [facility], [address], [place], [place] [address]  CBC  Status: Abnormal   Collection Time: 04/22/23  2:39 PM  Result Value Ref Range   WBC 13.2 (H) 4.0 - 10.5 K/uL   RBC 4.09 (L) 4.22 - 5.81 MIL/uL   Hemoglobin 10.6 (L) 13.0 - 17.0 g/dL   HCT 16.1 (L) 09.6 - 04.5 %   MCV 82.2 80.0 - 100.0 fL   MCH 25.9 (L) 26.0 - 34.0 pg   MCHC 31.5 30.0 - 36.0 g/dL   RDW 40.9 (H) 81.1 - 91.4 %   Platelets 231 150 - 400 K/uL   nRBC 0.0 0.0 - 0.2 %    Comment: Performed at [facility], [address], [place], [place] [address]  Comprehensive metabolic panel      Status: Abnormal   Collection Time: 04/22/23  2:39 PM  Result Value Ref Range   Sodium 135 135 - 145 mmol/L   Potassium 3.3 (L) 3.5 - 5.1 mmol/L   Chloride 103 98 - 111 mmol/L   CO2 21 (L) 22 - 32 mmol/L   Glucose, Bld 125 (H) 70 - 99 mg/dL    Comment: Glucose reference range applies only to samples taken after fasting for at least 8 hours.   BUN 22 8 - 23 mg/dL   Creatinine, Ser 6.21 0.61 - 1.24 mg/dL   Calcium 8.6 (L) 8.9 - 10.3 mg/dL   Total Protein 7.2 6.5 - 8.1 g/dL   Albumin 2.4 (L) 3.5 - 5.0 g/dL   AST 308 (H) 15 - 41 U/L   ALT 177 (H) 0 - 44 U/L   Alkaline Phosphatase 84 38 - 126 U/L   Total Bilirubin 2.5 (H) 0.0 - 1.2 mg/dL   GFR, Estimated >65 >78 mL/min    Comment: (NOTE) Calculated using the CKD-EPI Creatinine Equation (2021)    Anion gap 11 5 - 15    Comment: Performed at [facility], [address] [place], [place] [address]  Acetaminophen level     Status: Abnormal   Collection Time: 04/22/23  2:39 PM  Result Value Ref Range   Acetaminophen (Tylenol), Serum 39 (H) 10 - 30 ug/mL    Comment: (NOTE) Therapeutic concentrations vary significantly. A range of 10-30 ug/mL  may be an effective concentration for many patients. However, some  are best treated at concentrations outside of this range. Acetaminophen concentrations >150 ug/mL at 4 hours after ingestion  and >50 ug/mL at 12 hours after ingestion are often associated with  toxic reactions.  Performed at [facility], [address], [place], [place] [address]   Lactic acid, plasma     Status: None   Collection Time: 04/22/23  4:54 PM  Result Value Ref Range   Lactic Acid, Venous 1.0 0.5 - 1.9 mmol/L    Comment: Performed at [facility], [address], [place], [place] [address]  Urinalysis, Routine w reflex microscopic -Urine, Clean Catch     Status: Abnormal   Collection Time: 04/22/23  5:24 PM  Result Value Ref Range   Color, Urine AMBER (A) YELLOW    Comment: BIOCHEMICALS MAY BE AFFECTED BY COLOR    APPearance HAZY (A) CLEAR   Specific Gravity, Urine 1.020 1.005 - 1.030   pH 5.0 5.0 - 8.0   Glucose, UA NEGATIVE NEGATIVE mg/dL   Hgb urine dipstick SMALL (A) NEGATIVE   Bilirubin Urine NEGATIVE NEGATIVE   Ketones, ur NEGATIVE NEGATIVE mg/dL   Protein, ur 30 (A) NEGATIVE mg/dL   Nitrite NEGATIVE NEGATIVE   Leukocytes,Ua NEGATIVE NEGATIVE   RBC / HPF  6-10 0 - 5 RBC/hpf   WBC, UA 0-5 0 - 5 WBC/hpf   Bacteria, UA RARE (A) NONE SEEN   Squamous Epithelial / HPF 0-5 0 - 5 /HPF   Mucus PRESENT    Hyaline Casts, UA PRESENT     Comment: Performed at [facility], [address], [place], [place] [address]  Protime-INR     Status: Abnormal   Collection Time: 04/22/23  7:48 PM  Result Value Ref Range   Prothrombin Time 15.7 (H) 11.4 - 15.2 seconds   INR 1.2 0.8 - 1.2    Comment: (NOTE) INR goal varies based on device and disease states. Performed at [facility], [address] [place], [place] [address]   APTT     Status: None   Collection Time: 04/22/23  7:48 PM  Result Value Ref Range   aPTT 27 24 - 36 seconds    Comment: Performed at [facility], [address], [place], [place] [address]   CT ABDOMEN PELVIS W CONTRAST Result Date: 04/22/2023 CLINICAL DATA:  Acute abdominal pain. Patient reports fever and body aches. EXAM: CT ABDOMEN AND PELVIS WITH CONTRAST TECHNIQUE: Multidetector CT imaging of the abdomen and pelvis was performed using the standard protocol following bolus administration of intravenous contrast. RADIATION DOSE REDUCTION: This exam was performed according to the departmental dose-optimization program which includes automated exposure control, adjustment of the mA and/or kV according to patient size and/or use of iterative reconstruction technique. CONTRAST:  OMNIPAQUE IOHEXOL 350 MG/ML SOLN COMPARISON:  CT 01/31/2021 FINDINGS: Lower chest: Assessed on concurrent chest CTA, reported separately. Hepatobiliary: Benign cyst in the subcapsular right lobe of the liver. No  suspicious liver lesion. Unremarkable gallbladder. There is mild motion artifact which limits detailed assessment. Pancreas: Parenchymal atrophy. No ductal dilatation or inflammation. Spleen: Normal in size without focal abnormality. Adrenals/Urinary Tract: No adrenal nodule. No hydronephrosis or perinephric edema. Homogeneous renal enhancement with symmetric excretion on delayed phase imaging. There are multiple bilateral renal cysts. The largest cyst is in the right mid upper kidney measuring 10.5 cm. No further follow-up imaging is recommended. Urinary bladder is nondistended and not well assessed due to streak artifact from right hip arthroplasty. Stomach/Bowel: Midline ventral abdominal wall hernia contains short segment of small bowel. There is no associated wall thickening or obstruction. Right hemicolectomy. Colonic diverticulosis without diverticulitis. Moderate volume of colonic stool. Previous right inguinal hernia has been repaired. Vascular/Lymphatic: Aortic atherosclerosis without aneurysm. 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 No enlarged lymph nodes in the abdomen or pelvis. Reproductive: Fiducial markers or clips in the prostate. Other: Postsurgical change in the right inguinal canal, previous right inguinal hernia has been repaired. Midline ventral abdominal wall hernia contains short segment of small bowel without inflammation or obstruction. No ascites or free air. Musculoskeletal: Right hip arthroplasty. Chronic left hip arthropathy with flattening of the femoral head, near complete joint space loss, subchondral cystic change and sclerosis. This is similar in appearance to 11/27/2022 pelvis radiograph. There is a left hip joint effusion. Multilevel degenerative change in the lumbar spine. IMPRESSION: 1. 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 2. Midline ventral abdominal wall hernia contains short segment of small bowel without  inflammation or obstruction. 3. Colonic diverticulosis without diverticulitis. 4. Chronic left hip arthropathy with flattening of the femoral head, near complete joint space loss, subchondral cystic change and sclerosis. There is a left hip joint effusion. 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 Clinical correlation is recommended in the setting of fever for hip infection, although chronic changes were seen on September 2024 radiograph and appear similar. Aortic Atherosclerosis (ICD10-I70.0). These results were called by telephone at the time of interpretation on 04/22/2023 at 6:57 pm to provider Gloris Manchester , who verbally acknowledged these results. Electronically Signed   By: Narda Rutherford M.D.   On: 04/22/2023 18:58   CT Angio Chest PE W and/or Wo Contrast Result Date: 04/22/2023 CLINICAL DATA:  Pulmonary embolism (PE) suspected, high prob Patient presents with fever and body aches. EXAM: CT ANGIOGRAPHY CHEST WITH CONTRAST TECHNIQUE: Multidetector CT imaging of the chest was performed using the standard protocol during bolus administration of intravenous contrast. Multiplanar CT image reconstructions and MIPs were obtained to evaluate the vascular anatomy. Performed in conjunction with CT of the abdomen and pelvis. RADIATION DOSE REDUCTION: This exam was performed according to the departmental dose-optimization program which includes automated exposure control, adjustment of the mA and/or kV according to patient size and/or use of iterative reconstruction technique. CONTRAST:  OMNIPAQUE IOHEXOL 350 MG/ML SOLN COMPARISON:  Chest radiograph earlier today. FINDINGS: Cardiovascular: There are no filling defects within the pulmonary arteries to suggest pulmonary embolus. Dilated main pulmonary artery at 3.7 cm. The thoracic aorta is tortuous. No acute aortic finding. The heart is upper normal in size. No pericardial effusion. Mediastinum/Nodes: No mediastinal or hilar adenopathy. Unremarkable  esophagus. No thyroid nodule. Lungs/Pleura: Mild lower lobe bronchial thickening. Bandlike right lower lobe opacities favor atelectasis. There is no confluent airspace disease. No pleural fluid. No pulmonary mass or suspicious nodule. No features of pulmonary edema. Trachea and central most airways are clear. Upper Abdomen: Assessed on concurrent abdominopelvic CT, reported separately. Musculoskeletal: Diffuse thoracic spondylosis with anterior spurring, bulky anterior osteophytes in the lower cervical spine. Mild broad-based dextroscoliotic curvature. The right shoulder is dislocated with the humeral head directed anteriorly and flattening of the glenoid. There are erosive changes involving the shoulder joint which are suboptimally assessed this is a change from 11/05/2021 shoulder CT. Review of the MIP images confirms the above findings. IMPRESSION: 1. No pulmonary embolus. 2. Mild lower lobe bronchial thickening, can be seen with bronchitis or reactive airways disease. 3. Right shoulder dislocation with the humeral head directed anteriorly and flattening of the glenoid. There are erosive changes involving the shoulder joint which are suboptimally assessed. This is a change from 11/05/2021 shoulder CT. Recommend correlation with history of shoulder pain. 4. Dilated main pulmonary artery, can be seen with pulmonary arterial hypertension. Electronically Signed   By: Narda Rutherford M.D.   On: 04/22/2023 18:47   US Abdomen Limited RUQ (LIVER/GB) Result Date: 04/22/2023 CLINICAL DATA:  Fever. EXAM: ULTRASOUND ABDOMEN LIMITED RIGHT UPPER QUADRANT COMPARISON:  CT of the abdomen pelvis dated 01/31/2021. FINDINGS: Gallbladder: No gallstones or wall thickening visualized. No sonographic Murphy sign noted by sonographer. Common bile duct: Diameter: 7 mm Liver: The liver demonstrates a normal echogenicity. There is a 1.8 x 1.3 x 1.7 cm cyst in the right lobe of the liver also present on the prior CT of 01/31/2021. Portal  vein is patent on color Doppler imaging with normal direction of blood flow towards the liver. Other: Partially visualized large right renal cyst measuring up to 11 cm. IMPRESSION: 1. No gallstone. 2. Small right liver cyst. 3. Partially visualized right renal cyst. Electronically Signed   By: Elgie Collard M.D.   On: 04/22/2023 17:27   DG Chest Port 1 View Result Date: 04/22/2023 CLINICAL  DATA:  Cough EXAM: PORTABLE CHEST 1 VIEW COMPARISON:  X-ray 04/12/2023 FINDINGS: Slight linear opacity lung bases likely scar or atelectasis. No consolidation, pneumothorax or effusion. No edema. Normal cardiopericardial silhouette. Tortuous ectatic aorta. Degenerative changes of the spine with curvature. Fixation hardware along the lower cervical spine. IMPRESSION: Minimal basilar atelectasis.  Tortuous ectatic aorta. Electronically Signed   By: Karen Kays M.D.   On: 04/22/2023 13:22    Pending Labs Unresulted Labs (From admission, onward)     Start     Ordered   04/23/23 0500  CBC  Tomorrow morning,   R        04/22/23 1958   04/23/23 0500  Heparin level (unfractionated)  Once-Timed,   URGENT        04/22/23 1958   04/22/23 1914  C-reactive protein  Once,   URGENT        04/22/23 1916   04/22/23 1914  Sedimentation rate  Once,   URGENT        04/22/23 1916   04/22/23 1822  Hepatitis panel, acute  Once,   R        04/22/23 1822   04/22/23 1359  Blood culture (routine x 2)  BLOOD CULTURE X 2,   R (with STAT occurrences)      04/22/23 1358            Vitals/Pain Today's Vitals   04/22/23 1815 04/22/23 1930 04/22/23 1934 04/22/23 2000  BP: 112/74 120/71  100/65  Pulse: 84 86  84  Resp: 17 16    Temp:   98.4 F (36.9 C)   TempSrc:   Oral   SpO2: 97% 93%  94%  PainSc:        Isolation Precautions No active isolations  Medications Medications  piperacillin-tazobactam (ZOSYN) IVPB 3.375 g (3.375 g Intravenous New Bag/Given 04/22/23 1952)  heparin bolus via infusion 5,000 Units (has no  administration in time range)  heparin ADULT infusion 100 units/mL (25000 units/272mL) (has no administration in time range)  senna-docusate (Senokot-S) tablet 1 tablet (has no administration in time range)  ondansetron (ZOFRAN) tablet 4 mg (has no administration in time range)    Or  ondansetron (ZOFRAN) injection 4 mg (has no administration in time range)  acetaminophen (TYLENOL) tablet 1,000 mg (1,000 mg Oral Given 04/22/23 1408)  potassium chloride SA (KLOR-CON M) CR tablet 40 mEq (40 mEq Oral Given 04/22/23 1840)  iohexol (OMNIPAQUE) 350 MG/ML injection 100 mL (100 mLs Intravenous Contrast Given 04/22/23 1819)    Mobility walks with device     Focused Assessments Acute febrile illness    R Recommendations: See Admitting Provider Note  Report given to:   Additional Notes:   From [facility]

## 2023-04-22 NOTE — ED Notes (Signed)
Hospitalist at the bedside

## 2023-04-22 NOTE — Progress Notes (Signed)
Pharmacy Antibiotic Note  Randy Russo is a 83 y.o. male admitted on 04/22/2023 with  pylephlebitis .  Patient arrived reporting fever and generalized body aches that started this AM. No dysuria or GU complaints. No abdominal pain or N/V/D. Pharmacy has been consulted for Zosyn dosing.  No antibiotic given thus far in the ED.  WBC 13.2 Lactic acid 1.8 Tmax 101.2  UA: negative leukocytes, negative nitrites, rare bacteria, WBC 0-5  Bcx in process  Imaging:  US abdomen: No gallstone  Plan: Start Zosyn 3.375 g IV q8h Continue to monitor renal function and culture data     Temp (24hrs), Avg:99.9 F (37.7 C), Min:98.5 F (36.9 C), Max:101.2 F (38.4 C)  Recent Labs  Lab 04/22/23 1423 04/22/23 1439 04/22/23 1654  WBC  --  13.2*  --   CREATININE  --  0.95  --   LATICACIDVEN 1.8  --  1.0    Estimated Creatinine Clearance: 63.9 mL/min (by C-G formula based on SCr of 0.95 mg/dL).    No Known Allergies  Antimicrobials this admission: 1/28 Zosyn >>    Microbiology results: 1/28 BCx: IP   Thank you for allowing pharmacy to be a part of this patient's care.  Merryl Hacker, PharmD Clinical Pharmacist 04/22/2023 7:23 PM

## 2023-04-23 ENCOUNTER — Encounter (HOSPITAL_COMMUNITY): Payer: Self-pay | Admitting: Student

## 2023-04-23 DIAGNOSIS — Z515 Encounter for palliative care: Secondary | ICD-10-CM

## 2023-04-23 DIAGNOSIS — R7989 Other specified abnormal findings of blood chemistry: Secondary | ICD-10-CM | POA: Diagnosis not present

## 2023-04-23 DIAGNOSIS — Z7189 Other specified counseling: Secondary | ICD-10-CM | POA: Diagnosis not present

## 2023-04-23 DIAGNOSIS — R509 Fever, unspecified: Principal | ICD-10-CM

## 2023-04-23 DIAGNOSIS — I81 Portal vein thrombosis: Secondary | ICD-10-CM | POA: Diagnosis not present

## 2023-04-23 LAB — COMPREHENSIVE METABOLIC PANEL
ALT: 139 U/L — ABNORMAL HIGH (ref 0–44)
AST: 101 U/L — ABNORMAL HIGH (ref 15–41)
Albumin: 2.1 g/dL — ABNORMAL LOW (ref 3.5–5.0)
Alkaline Phosphatase: 75 U/L (ref 38–126)
Anion gap: 9 (ref 5–15)
BUN: 23 mg/dL (ref 8–23)
CO2: 20 mmol/L — ABNORMAL LOW (ref 22–32)
Calcium: 8.2 mg/dL — ABNORMAL LOW (ref 8.9–10.3)
Chloride: 108 mmol/L (ref 98–111)
Creatinine, Ser: 0.96 mg/dL (ref 0.61–1.24)
GFR, Estimated: >60 mL/min (ref >60)
Glucose, Bld: 101 mg/dL — ABNORMAL HIGH (ref 70–99)
Potassium: 3.4 mmol/L — ABNORMAL LOW (ref 3.5–5.1)
Sodium: 137 mmol/L (ref 135–145)
Total Bilirubin: 2.4 mg/dL — ABNORMAL HIGH (ref 0.0–1.2)
Total Protein: 6.1 g/dL — ABNORMAL LOW (ref 6.5–8.1)

## 2023-04-23 LAB — CBC
HCT: 28.9 % — ABNORMAL LOW (ref 39.0–52.0)
Hemoglobin: 9.3 g/dL — ABNORMAL LOW (ref 13.0–17.0)
MCH: 26.6 pg (ref 26.0–34.0)
MCHC: 32.2 g/dL (ref 30.0–36.0)
MCV: 82.8 fL (ref 80.0–100.0)
Platelets: 204 10*3/uL (ref 150–400)
RBC: 3.49 MIL/uL — ABNORMAL LOW (ref 4.22–5.81)
RDW: 19.9 % — ABNORMAL HIGH (ref 11.5–15.5)
WBC: 12.1 10*3/uL — ABNORMAL HIGH (ref 4.0–10.5)
nRBC: 0 % (ref 0.0–0.2)

## 2023-04-23 LAB — HEPATIC FUNCTION PANEL
ALT: 146 U/L — ABNORMAL HIGH (ref 0–44)
AST: 112 U/L — ABNORMAL HIGH (ref 15–41)
Albumin: 2.3 g/dL — ABNORMAL LOW (ref 3.5–5.0)
Alkaline Phosphatase: 85 U/L (ref 38–126)
Bilirubin, Direct: 1 mg/dL — ABNORMAL HIGH (ref 0.0–0.2)
Indirect Bilirubin: 0.9 mg/dL (ref 0.3–0.9)
Total Bilirubin: 1.9 mg/dL — ABNORMAL HIGH (ref 0.0–1.2)
Total Protein: 6.8 g/dL (ref 6.5–8.1)

## 2023-04-23 LAB — PROTIME-INR
INR: 1.2 (ref 0.8–1.2)
Prothrombin Time: 15.2 s (ref 11.4–15.2)

## 2023-04-23 LAB — HEPATITIS PANEL, ACUTE
HCV Ab: NONREACTIVE
Hep A IgM: NONREACTIVE
Hep B C IgM: NONREACTIVE
Hepatitis B Surface Ag: NONREACTIVE

## 2023-04-23 LAB — C-REACTIVE PROTEIN: CRP: 27.6 mg/dL — ABNORMAL HIGH (ref <1.0)

## 2023-04-23 LAB — HEPARIN LEVEL (UNFRACTIONATED)
Heparin Unfractionated: <0.1 [IU]/mL — ABNORMAL LOW (ref 0.30–0.70)
Heparin Unfractionated: <0.1 [IU]/mL — ABNORMAL LOW (ref 0.30–0.70)

## 2023-04-23 MED ORDER — LACTATED RINGERS IV SOLN: INTRAVENOUS | Status: DC

## 2023-04-23 MED ORDER — KETOROLAC TROMETHAMINE 15 MG/ML IJ SOLN
15.0000 mg | Freq: Once | INTRAMUSCULAR | Status: AC
  Administered 2023-04-23: 15 mg via INTRAVENOUS
  Filled 2023-04-23: qty 1

## 2023-04-23 MED ORDER — HEPARIN BOLUS VIA INFUSION
2000.0000 [IU] | Freq: Once | INTRAVENOUS | Status: AC
  Administered 2023-04-23: 2000 [IU] via INTRAVENOUS
  Filled 2023-04-23: qty 2000

## 2023-04-23 MED ORDER — IBUPROFEN 400 MG PO TABS
400.0000 mg | ORAL_TABLET | Freq: Four times a day (QID) | ORAL | Status: DC | PRN
  Administered 2023-04-23: 400 mg via ORAL
  Filled 2023-04-23: qty 1

## 2023-04-23 NOTE — Plan of Care (Signed)
  Problem: Acute Rehab OT Goals (only OT should resolve) Goal: Pt. Will Perform Grooming Flowsheets (Taken 04/23/2023 1144) Pt Will Perform Grooming:  with modified independence  standing  with supervision Goal: Pt. Will Perform Lower Body Bathing Flowsheets (Taken 04/23/2023 1144) Pt Will Perform Lower Body Bathing:  with modified independence  sitting/lateral leans Goal: Pt. Will Perform Lower Body Dressing Flowsheets (Taken 04/23/2023 1144) Pt Will Perform Lower Body Dressing:  with modified independence  sitting/lateral leans Goal: Pt. Will Transfer To Toilet Flowsheets (Taken 04/23/2023 1144) Pt Will Transfer to Toilet:  with modified independence  ambulating  stand pivot transfer Goal: Pt. Will Perform Toileting-Clothing Manipulation Flowsheets (Taken 04/23/2023 1144) Pt Will Perform Toileting - Clothing Manipulation and hygiene: with modified independence Goal: Pt/Caregiver Will Perform Home Exercise Program Flowsheets (Taken 04/23/2023 1144) Pt/caregiver will Perform Home Exercise Program:  Increased ROM  Increased strength  Right Upper extremity  Left upper extremity  Independently  Grayton Lobo OT, MOT

## 2023-04-23 NOTE — Evaluation (Signed)
Physical Therapy Evaluation Patient Details Name: Randy Russo MRN: [id number] DOB: [birth year] Today's Date: 04/23/2023  History of Present Illness  Initial vitals showed temp 101.2, RR 18, HR 95, BP 108/62 and SpO2 96% on room air. Labs significant for WBC 13.2, Hgb 10.6, platelet 231, K+ 3.3, glucose 125, creatinine 0.95, albumin 2.4, AST/ALT 183/177, bilirubin 2.5, lactic acid 1.8-1.0, ESR 80, negative flu, RSV and COVID test, UA shows mild hemoglobinuria and proteinuria but no signs of infection, PT/INR 15.7/1.2, acetaminophen level 39.   CXR with minimal bibasilar atelectasis but no acute abnormalities. RUQ U/S with small right liver cyst but no gallstones. CT A/P shows small volume nonocclusive thrombus in the main portal vein CTA chest PE study negative for PE.  Patient received IV Tylenol 1 g, IV NS 1 L bolus and oral potassium 40 mEq x 1. Patient was started on IV Zosyn and heparin infusion and GI was consulted for evaluation.  TRH consulted for admission   Clinical Impression  Patient demonstrates labored movement for sitting up at bedside with [facility] raised, able to transfer to chair leaning on armrest with Mod assist and verbal/tactile cueing for safety.  Patient able to take a few steps at bedside using RW, but limited due to fatigue and generalized weakness. Patient tolerated sitting up in chair after therapy. Patient will benefit from continued skilled physical therapy in hospital and recommended venue below to increase strength, balance, endurance for safe ADLs and gait.          If plan is discharge home, recommend the following: A lot of help with walking and/or transfers;Help with stairs or ramp for entrance;Assistance with cooking/housework;A lot of help with bathing/dressing/bathroom   Can travel by private vehicle   Yes    Equipment Recommendations None recommended by PT  Recommendations for Other Services       Functional Status Assessment Patient has had a recent  decline in their functional status and demonstrates the ability to make significant improvements in function in a reasonable and predictable amount of time.     Precautions / Restrictions Precautions Precautions: Fall Restrictions Weight Bearing Restrictions Per Provider Order: No      Mobility  Bed Mobility Overal bed mobility: Needs Assistance Bed Mobility: Supine to Sit     Supine to sit: Contact guard, HOB elevated     General bed mobility comments: increased time with labored movement    Transfers Overall transfer level: Needs assistance Equipment used: Rolling walker (2 wheels), 1 person hand held assist, None Transfers: Sit to/from Stand, Bed to chair/wheelchair/BSC Sit to Stand: Min assist   Step pivot transfers: Mod assist       General transfer comment: unsteady labored movement using armrest of chair during stand pivot    Ambulation/Gait Ambulation/Gait assistance: Mod assist, Min assist Gait Distance (Feet): 5 Feet Assistive device: Rolling walker (2 wheels) Gait Pattern/deviations: Decreased step length - right, Decreased step length - left, Decreased stride length, Trunk flexed Gait velocity: slow     General Gait Details: limited to a few steps forward/backward before having to sit due to BLE weakness  Stairs            Wheelchair Mobility     Tilt Bed    Modified Rankin (Stroke Patients Only)       Balance Overall balance assessment: Needs assistance Sitting-balance support: Feet supported, No upper extremity supported Sitting balance-Leahy Scale: Good Sitting balance - Comments: seated at EOB   Standing balance support: Bilateral upper  extremity supported, During functional activity, Reliant on assistive device for balance Standing balance-Leahy Scale: Poor Standing balance comment: using RW                             Pertinent Vitals/Pain Pain Assessment Pain Assessment: No/denies pain    Home Living  Family/patient expects to be discharged to:: Private residence Living Arrangements: Alone Available Help at Discharge: Family;Available PRN/intermittently Type of Home: House Home Access: Stairs to enter Entrance Stairs-Rails: None Entrance Stairs-Number of Steps: 1   Home Layout: One level Home Equipment: Agricultural consultant (2 wheels);Wheelchair - manual Additional Comments: From [facility] per chart review.    Prior Function Prior Level of Function : Independent/Modified Independent             Mobility Comments: Pt reports ue of w/c mostly. Use of RW for transfer to w/c. ADLs Comments: Pt reports independent prior to last admission     Extremity/Trunk Assessment   Upper Extremity Assessment Upper Extremity Assessment: Defer to OT evaluation RUE Deficits / Details: 2-/5 shoulder flexion from previous injury. LUE Deficits / Details: Generally weak ; 4/5 grossly.    Lower Extremity Assessment Lower Extremity Assessment: Generalized weakness    Cervical / Trunk Assessment Cervical / Trunk Assessment: Kyphotic  Communication   Communication Communication: No apparent difficulties Cueing Techniques: Verbal cues;Tactile cues  Cognition Arousal: Alert Behavior During Therapy: WFL for tasks assessed/performed Overall Cognitive Status: No family/caregiver present to determine baseline cognitive functioning                                          General Comments      Exercises     Assessment/Plan    PT Assessment Patient needs continued PT services  PT Problem List Decreased strength;Decreased activity tolerance;Decreased balance;Decreased mobility       PT Treatment Interventions DME instruction;Gait training;Stair training;Functional mobility training;Therapeutic activities;Therapeutic exercise;Balance training;Patient/family education    PT Goals (Current goals can be found in the Care Plan section)  Acute Rehab PT Goals Patient Stated  Goal: return home PT Goal Formulation: With patient Time For Goal Achievement: 05/07/23 Potential to Achieve Goals: Good    Frequency Min 3X/week     Co-evaluation               AM-PAC PT "6 Clicks" Mobility  Outcome Measure Help needed turning from your back to your side while in a flat bed without using bedrails?: None Help needed moving from lying on your back to sitting on the side of a flat bed without using bedrails?: A Little Help needed moving to and from a bed to a chair (including a wheelchair)?: A Lot Help needed standing up from a chair using your arms (e.g., wheelchair or bedside chair)?: A Lot Help needed to walk in hospital room?: A Lot Help needed climbing 3-5 steps with a railing? : Total 6 Click Score: 14    End of Session   Activity Tolerance: Patient tolerated treatment well;Patient limited by fatigue Patient left: in chair;with call bell/phone within reach Nurse Communication: Mobility status PT Visit Diagnosis: Unsteadiness on feet (R26.81);Other abnormalities of gait and mobility (R26.89);Muscle weakness (generalized) (M62.81)    Time: 1010-1030 PT Time Calculation (min) (ACUTE ONLY): 20 min   Charges:   PT Evaluation $PT Eval Moderate Complexity: 1 Mod PT Treatments $Therapeutic Activity:  8-22 mins PT General Charges $$ ACUTE PT VISIT: 1 Visit         12:19 PM, 04/23/23 Ocie Bob, MPT Physical Therapist with [facility] [phone] office [phone] mobile phone

## 2023-04-23 NOTE — Consult Note (Signed)
Consultation Note Date: 04/23/2023   Patient Name: Randy Russo  DOB: 1940/06/22  MRN: [id number]  Age / Sex: 83 y.o., male  PCP: Assunta Found, MD Referring Physician: Kendell Bane, MD  Reason for Consultation: Establishing goals of care  HPI/Patient Profile: 83 y.o. male  with past medical history of generalized deconditioning, history of GI bleed, COPD, HTN, partial colectomy 2019 recurrent falls and osteoarthritis, history of cervical fusion, history of prostate cancer with PET scan 02/28/2023, inguinal hernia repair 2022, left knee replacement, right hip replacement admitted on 04/22/2023 with portal vein thrombosis with elevated LFTs but no radiology abnormalities in the liver or biliary tract.  From [facility] where he had been for short-term rehab after recent discharge from [facility].  Clinical Assessment and Goals of Care: I have reviewed medical records including EPIC notes, labs and imaging, received report from RN, assessed the patient.  Randy Russo is sitting up in the [place] chair in his room.  He greets me, making and mostly keeping eye contact.  He is alert, oriented to self.  He is able to tell me where we are and the month if given enough time, initially stated we were in [place] and that it was December.  I do believe that he can make his basic needs known.  There is no family at bedside at this time.  Randy Russo tells me that he has had a bowel movement.  Bedside nursing staff present attending to needs.  We meet to discuss diagnosis prognosis, GOC, EOL wishes, disposition and options.  I introduced Palliative Medicine as specialized medical care for people living with serious illness. It focuses on providing relief from the symptoms and stress of a serious illness. The goal is to improve quality of life for both the patient and the family.  We then focused on their current illness.  We talk about  portal vein thrombosis and the treatment plan with anticoagulation.  We talked about IV antibiotics for fever.  We talked about the plan to return to short-term rehab when able.  The natural disease trajectory and expectations at EOL were discussed.  Advanced directives, concepts specific to code status, artifical feeding and hydration, and rehospitalization were considered and discussed.  DNR noted.   Hospice and Palliative Care services outpatient were explained and offered.  Discussed the importance of continued conversation with family and the medical providers regarding overall plan of care and treatment options, ensuring decisions are within the context of the patient's values and GOCs.  Questions and concerns were addressed.   The family was encouraged to call with questions or concerns.  PMT will continue to support holistically.  Conference with attending, bedside nursing staff, transition of care team related to patient condition, needs, goals of care, disposition.   HCPOA NEXT OF KIN    SUMMARY OF RECOMMENDATIONS   Continue to treat the treatable but no CPR or intubation Time for outcomes Return to [facility] for short-term rehab    Code Status/Advance Care Planning: DNR  Symptom  Management:  Per hospitalist, no additional needs at this time.  Palliative Prophylaxis:  Frequent Pain Assessment and Oral Care  Additional Recommendations (Limitations, Scope, Preferences): Continue to treat the treatable but no CPR or intubation  Psycho-social/Spiritual:  Desire for further Chaplaincy support:no Additional Recommendations: Caregiving  Support/Resources  Prognosis:  Unable to determine, based on outcomes.  Discharge Planning: Anticipate return to short-term rehab then ultimately home      Primary Diagnoses: Present on Admission:  Portal vein thrombosis   I have reviewed the medical record, interviewed the patient and family, and examined the patient. The  following aspects are pertinent.  Past Medical History:  Diagnosis Date   Arthritis    Cancer [facility])    Prostate   COPD (chronic obstructive pulmonary disease) (HCC)    Diverticulitis    Hypertension    Lower GI bleed 12/27/2017   Social History   Socioeconomic History   Marital status: Married    Spouse name: Not on file   Number of children: Not on file   Years of education: Not on file   Highest education level: Not on file  Occupational History   Not on file  Tobacco Use   Smoking status: Former    Types: Cigars   Smokeless tobacco: Never  Vaping Use   Vaping status: Never Used  Substance and Sexual Activity   Alcohol use: No   Drug use: No   Sexual activity: Not Currently  Other Topics Concern   Not on file  Social History Narrative   Not on file   Social Drivers of Health   Financial Resource Strain: Low Risk  (11/30/2022)   Received from [facility]   Overall Financial Resource Strain (CARDIA)    Difficulty of Paying Living Expenses: Not very hard  Food Insecurity: No Food Insecurity (04/23/2023)   Hunger Vital Sign    Worried About Running Out of Food in the Last Year: Never true    Ran Out of Food in the Last Year: Never true  Recent Concern: Food Insecurity - Food Insecurity Present (04/22/2023)   Hunger Vital Sign    Worried About Running Out of Food in the Last Year: Never true    Ran Out of Food in the Last Year: Sometimes true  Transportation Needs: No Transportation Needs (04/23/2023)   PRAPARE - Administrator, Civil Service (Medical): No    Lack of Transportation (Non-Medical): No  Physical Activity: Unknown (12/27/2017)   Exercise Vital Sign    Days of Exercise per Week: Patient declined    Minutes of Exercise per Session: Patient declined  Stress: Not on file  Social Connections: Unknown (04/23/2023)   Social Connection and Isolation Panel [NHANES]    Frequency of Communication with Friends and Family: Once a week    Frequency of  Social Gatherings with Friends and Family: Once a week    Attends Religious Services: 1 to 4 times per year    Active Member of [employer] or Organizations: No    Attends Engineer, structural: 1 to 4 times per year    Marital Status: Patient unable to answer   Family History  Problem Relation Age of Onset   Diabetes Mother    Dementia Father    Scheduled Meds:  B-complex with vitamin C  1 tablet Oral Daily   cholecalciferol  1,000 Units Oral Daily   latanoprost  1 drop Both Eyes QHS   loratadine  10 mg Oral Daily  umeclidinium bromide  1 puff Inhalation Daily   Continuous Infusions:  heparin 1,650 Units/hr (04/23/23 1032)   piperacillin-tazobactam (ZOSYN)  IV 3.375 g (04/23/23 0502)   PRN Meds:.albuterol, ibuprofen, ondansetron **OR** ondansetron (ZOFRAN) IV, senna-docusate Medications Prior to Admission:  Prior to Admission medications   Medication Sig Start Date End Date Taking? Authorizing Provider  acetaminophen (TYLENOL) 650 MG CR tablet Take 650 mg by mouth every 4 (four) hours as needed for pain.   Yes [provider]  albuterol (PROVENTIL) (2.5 MG/3ML) 0.083% nebulizer solution Take 3 mLs (2.5 mg total) by nebulization every 6 (six) hours as needed for wheezing or shortness of breath. 04/14/23  Yes Vassie Loll, MD  B Complex-C (B-COMPLEX WITH VITAMIN C) tablet Take 1 tablet by mouth daily. 04/15/23  Yes Vassie Loll, MD  Cholecalciferol 25 MCG (1000 UT) capsule Take 1 capsule (1,000 Units total) by mouth daily. 04/14/23  Yes Vassie Loll, MD  furosemide (LASIX) 20 MG tablet Take 20 mg by mouth daily.   Yes [provider]  latanoprost (XALATAN) 0.005 % ophthalmic solution Place 1 drop into both eyes at bedtime.   Yes [provider]  loratadine (CLARITIN) 10 MG tablet Take 1 tablet (10 mg total) by mouth daily. 04/15/23  Yes Vassie Loll, MD  Omega-3 Fatty Acids (FISH OIL) 1200 MG CAPS Take 1 capsule (1,200 mg total) by mouth daily.  04/14/23  Yes Vassie Loll, MD  SPIRIVA RESPIMAT 2.5 MCG/ACT AERS Inhale 2 puffs into the lungs daily. 04/14/23  Yes [provider]   No Known Allergies Review of Systems  Unable to perform ROS: Age    Physical Exam Vitals and nursing note reviewed.  Constitutional:      General: He is not in acute distress.    Appearance: He is ill-appearing.  HENT:     Mouth/Throat:     Mouth: Mucous membranes are dry.  Cardiovascular:     Rate and Rhythm: Normal rate.  Pulmonary:     Effort: Pulmonary effort is normal. No respiratory distress.  Skin:    General: Skin is warm and dry.  Neurological:     Mental Status: He is alert.     Comments: Oriented to self, place and month if given enough time.  Psychiatric:        Mood and Affect: Mood normal.        Behavior: Behavior normal.     Comments: Calm and cooperative      Vital Signs: BP (!) 89/46 (BP Location: Right Arm)   Pulse 88   Temp 97.9 F (36.6 C) (Oral)   Resp 19   Ht 5\' 6"  (1.676 m)   Wt 92.1 kg   SpO2 100%   BMI 32.77 kg/m  Pain Scale: 0-10   Pain Score: 10-Worst pain ever   SpO2: SpO2: 100 % O2 Device:SpO2: 100 % O2 Flow Rate: .O2 Flow Rate (L/min): 1 L/min  IO: Intake/output summary:  Intake/Output Summary (Last 24 hours) at 04/23/2023 1514 Last data filed at 04/23/2023 9604 Gross per 24 hour  Intake 1582.48 ml  Output --  Net 1582.48 ml    LBM: Last BM Date :  (unknown) Baseline Weight: Weight: 92.1 kg Most recent weight: Weight: 92.1 kg     Palliative Assessment/Data:     Time In: 1000 Time Out: 1040 Time Total: 40 minutes  Greater than 50%  of this time was spent counseling and coordinating care related to the above assessment and plan.  Signed by: Rodney Booze  Ernesto Rutherford, NP   Please contact Palliative Medicine Team phone at [phone] for questions and concerns.  For individual provider: See Loretha Stapler

## 2023-04-23 NOTE — TOC Initial Note (Signed)
Transition of Care [facility]) - Initial/Assessment Note    Patient Details  Name: Randy Russo MRN: [id number] Date of Birth: 05/13/1940  [facility] CM/SW Contact:    Villa Herb, LCSWA Phone Number: 04/23/2023, 1:04 PM  Clinical Narrative:                 Pt arrived from [facility]. CSW spoke to [place] with CV who states pt will need a new SNF auth prior to return. PT has seen pt and recommends SNF placement. TOC to follow.   Expected Discharge Plan: Skilled Nursing Facility Barriers to Discharge: Continued Medical Work up   Patient Goals and CMS Choice Patient states their goals for this hospitalization and ongoing recovery are:: go to SNF CMS Medicare.gov Compare Post Acute Care list provided to:: Patient Choice offered to / list presented to : Patient [place] ownership interest in [facility].provided to:: Patient    Expected Discharge Plan and Services In-house Referral: Clinical Social Work Discharge Planning Services: CM Consult Post Acute Care Choice: Skilled Nursing Facility Living arrangements for the past 2 months: Single Family Home                                      Prior Living Arrangements/Services Living arrangements for the past 2 months: Single Family Home Lives with:: Self Patient language and need for interpreter reviewed:: Yes Do you feel safe going back to the place where you live?: Yes      Need for Family Participation in Patient Care: Yes (Comment) Care giver support system in place?: Yes (comment)   Criminal Activity/Legal Involvement Pertinent to Current Situation/Hospitalization: No - Comment as needed  Activities of Daily Living   ADL Screening (condition at time of admission) Independently performs ADLs?: No Does the patient have a NEW difficulty with bathing/dressing/toileting/self-feeding that is expected to last >3 days?: Yes (Initiates electronic notice to provider for possible OT consult) Does  the patient have a NEW difficulty with getting in/out of bed, walking, or climbing stairs that is expected to last >3 days?: Yes (Initiates electronic notice to provider for possible PT consult) Does the patient have a NEW difficulty with communication that is expected to last >3 days?: Yes (Initiates electronic notice to provider for possible SLP consult) Is the patient deaf or have difficulty hearing?: No Does the patient have difficulty seeing, even when wearing glasses/contacts?: No Does the patient have difficulty concentrating, remembering, or making decisions?: No  Permission Sought/Granted                  Emotional Assessment Appearance:: Appears stated age Attitude/Demeanor/Rapport: Engaged Affect (typically observed): Accepting   Alcohol / Substance Use: Not Applicable Psych Involvement: No (comment)  Admission diagnosis:  Portal vein thrombosis [I81] Elevated liver function tests [R79.89] Acute febrile illness [R50.9] Patient Active Problem List   Diagnosis Date Noted   Acute febrile illness 04/23/2023   Elevated liver function tests 04/23/2023   Portal vein thrombosis 04/22/2023   Ambulatory dysfunction 04/12/2023   Acute lower GI bleeding 11/01/2021   Pressure injury of skin 11/01/2021   Right inguinal hernia    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   Rectal bleeding 11/08/2018   Colitis 11/08/2018   Odynophagia    Gastroesophageal reflux disease without esophagitis    Non-intractable vomiting    Acute blood loss anemia 01/02/2018   Diverticulosis of colon with  hemorrhage of large intestine    Syncope and collapse    COPD (chronic obstructive pulmonary disease) (HCC) 12/27/2017   Essential hypertension 12/27/2017   Near syncope 12/27/2017   PCP:  Assunta Found, MD Pharmacy:   [facility] [address] [place] - [address] [place] [address] [place] [address]  [place] [address] Phone: [phone] Fax: [phone]     Social Drivers of Health  (SDOH) Social History: SDOH Screenings   Food Insecurity: No Food Insecurity (04/23/2023)  Recent Concern: Food Insecurity - Food Insecurity Present (04/22/2023)  Housing: Low Risk  (04/23/2023)  Recent Concern: Housing - High Risk (04/13/2023)  Transportation Needs: No Transportation Needs (04/23/2023)  Utilities: Patient Unable To Answer (04/23/2023)  Financial Resource Strain: Low Risk  (11/30/2022)   Received from [facility]  Physical Activity: Unknown (12/27/2017)  Social Connections: Unknown (04/23/2023)  Tobacco Use: Medium Risk (04/23/2023)  Health Literacy: Medium Risk (11/30/2022)   Received from [facility]   SDOH Interventions:     Readmission Risk Interventions     No data to display

## 2023-04-23 NOTE — Progress Notes (Signed)
   04/23/23 0037  Vitals  Temp (!) 103.2 F (39.6 C)  Temp Source Oral  BP (!) 157/68  MAP (mmHg) 96  BP Location Right Arm  BP Method Automatic  Patient Position (if appropriate) Lying  Pulse Rate (!) 127  Pulse Rate Source Dinamap  Resp (!) 32  MEWS COLOR  MEWS Score Color Red  Oxygen Therapy  SpO2 90 %  O2 Device Room Air  MEWS Score  MEWS Temp 2  MEWS Systolic 0  MEWS Pulse 2  MEWS RR 2  MEWS LOC 0  MEWS Score 6   Pt began to shiver and complaining of being cold. Vitals taken. Waiting for order of tylenol for fever. Will place pt on oxygen 2 liters

## 2023-04-23 NOTE — Consult Note (Signed)
PHARMACY - ANTICOAGULATION  Pharmacy Consult for Heparin Indication:  portal vein thrombosis Brief A/P: Heparin level subtherapeutic Increase Heparin rate  No Known Allergies  Patient Measurements: Heparin Dosing Weight: 83.6 kg  Vital Signs: Temp: 97.9 F (36.6 C) (01/29 1258) Temp Source: Oral (01/29 1258) BP: 89/46 (01/29 1258) Pulse Rate: 88 (01/29 1258)  Labs: Recent Labs    04/22/23 1439 04/22/23 1948 04/23/23 0441 04/23/23 1452 04/23/23 1523  HGB 10.6*  --  9.3*  --   --   HCT 33.6*  --  28.9*  --   --   PLT 231  --  204  --   --   APTT  --  27  --   --   --   LABPROT  --  15.7*  --   --  15.2  INR  --  1.2  --   --  1.2  HEPARINUNFRC  --   --  <0.10* <0.10*  --   CREATININE 0.95  --  0.96  --   --    Estimated Creatinine Clearance: 63 mL/min (by C-G formula based on SCr of 0.96 mg/dL).  Assessment: 83 y.o. male with portal vein thrombosis for heparin.   Heparin level still undetectable on 1650 units/hr. No bleeding or IV issues noted.   Goal of Therapy:  Heparin level 0.3-0.7 units/ml Monitor platelets by anticoagulation protocol: Yes   Plan:  Rebolus Heparin 2000 units IV, then increase heparin 1950 units/hr Check heparin level in 8 hours.  Sheppard Coil PharmD., BCPS Clinical Pharmacist 04/23/2023 4:42 PM

## 2023-04-23 NOTE — Plan of Care (Signed)
  Problem: Acute Rehab PT Goals(only PT should resolve) Goal: Pt Will Go Supine/Side To Sit Outcome: Progressing Flowsheets (Taken 04/23/2023 1220) Pt will go Supine/Side to Sit:  with contact guard assist  with supervision Goal: Patient Will Transfer Sit To/From Stand Outcome: Progressing Flowsheets (Taken 04/23/2023 1220) Patient will transfer sit to/from stand:  with minimal assist  with contact guard assist Goal: Pt Will Transfer Bed To Chair/Chair To Bed Outcome: Progressing Flowsheets (Taken 04/23/2023 1220) Pt will Transfer Bed to Chair/Chair to Bed: with min assist Goal: Pt Will Ambulate Outcome: Progressing Flowsheets (Taken 04/23/2023 1220) Pt will Ambulate:  15 feet  with minimal assist  with moderate assist  with rolling walker   12:21 PM, 04/23/23 Ocie Bob, MPT Physical Therapist with [facility] [phone] office [phone] mobile phone

## 2023-04-23 NOTE — Hospital Course (Addendum)
Randy Russo is a 83 y.o. male with medical history significant for generalized deconditioning, GI bleed, COPD, HTN, recurrent falls and osteoarthritis who presented to the ED via EMS from SNF, [facility], for evaluation of fever and generalized bodyaches.   On evaluation, patient reports feeling weak, generalized muscle aches and stiff.  He reports a chronic nonproductive cough from his COPD but denies any shortness of breath, abdominal pain, chest pain, headache, dizziness, dysuria, melena, nausea, vomiting, hematuria or back pain.   ED Course: Initial vitals showed temp 101.2, RR 18, HR 95, BP 108/62 and SpO2 96% on room air. Labs significant for WBC 13.2  AST/ALT 183/177, bilirubin 2.5, lactic acid 1.8-1.0    CT A/P shows small volume nonocclusive thrombus in the main portal vein CTA chest PE study negative for PE.  CTA chest PE study negative for PE.       Assessment & Plan:   Principal Problem:   Portal vein thrombosis Active Problems:   Acute febrile illness   Elevated liver function tests    Portal vein thrombosis with Fever - Fever, leukocytosis and generalized weakness and muscle aches found to have thrombosis of his portal vein.   No evidence of respiratory, skin or urinary infection.   -LFTs elevated but no radiological abnormalities in the liver or biliary tract. Septic thrombophlebitis is high on differential in the setting of systemic symptoms, elevated WBC, ESR and evidence of portal vein thrombosis in this elderly gentleman. -GI consulted, appreciate recs  -- Will continue current IV antibiotic of Zosyn -Continue heparin drip --- in anticipation to Eliquis in 24 hours  -All blood cultures    Generalized weakness/recent falls, osteoarthritis, -Chronic history of generalized deconditioning Worsening in the setting of infection -PT/OT eval and treat -Continue vitamin supplementation   Transaminitis    Latest Ref Rng & Units 04/23/2023    4:41 AM  04/22/2023    2:39 PM 04/12/2023    6:22 PM  Hepatic Function  Total Protein 6.5 - 8.1 g/dL 6.1  7.2  7.1   Albumin 3.5 - 5.0 g/dL 2.1  2.4  3.2   AST 15 - 41 U/L 101  183  15   ALT 0 - 44 U/L 139  177  16   Alk Phosphatase 38 - 126 U/L 75  84  60   Total Bilirubin 0.0 - 1.2 mg/dL 2.4  2.5  0.4    -In the setting of portal vein thrombosis, ?  Infection CT does not show any abnormalities of the liver or biliary system. Acetaminophen level slightly elevated to 39 however his elevated LFTs are likely due to thrombus in his portal vein. -GI consulted, appreciate recs -Trend LFTs -Follow-up acute hepatitis labs    Hypokalemia  -Monitoring repleted Checking magnesium  hypertension  -Stable continue home meds  History of GI bleed: H&H stable, monitoring   COPD -No signs of exacerbation -Continue Incruse Ellipta and Claritin -As needed albuterol nebs

## 2023-04-23 NOTE — Consult Note (Signed)
Gastroenterology Consult   Referring Provider: No ref. provider found Primary Care Physician:  Assunta Found, MD Primary Gastroenterologist:  Dr. Levon Hedger   Patient ID: Randy Russo; [id number]; [birth year]   Admit date: 04/22/2023  LOS: 1 day   Date of Consultation: 04/23/2023  Reason for Consultation:  portal vein thrombosis, Elevated LFTs   History of Present Illness   Randy Russo is a 83 y.o. year old male Patient found to have elevated LFTs, elevated white count.  Right upper quadrant ultrasound with no gallstones, very small right liver cyst, partially visualized right renal cyst, patient underwent CT abdomen pelvis with contrast which showed small volume nonocclusive thrombus in the main portal vein, midline ventral abdominal wall hernia with short segment of small bowel contained without inflammation or obstruction, colonic diverticulosis without diverticulitis.  GI consulted for further evaluation  ED course: CT as outlined above with small PVT Sed rate 80 Potassium 3.3 AST 183, ALT 177, T. bili 2.5 Tylenol level 39 INR 1.2 Acute hepatitis panel in process CRP in process  Case was discussed with Dr. Levon Hedger who advised patient should be started on Heparin in absence of underlying cirrhosis.   Consult: Patient alert and oriented to person and month, he knows he is in [place] but unsure of the location or situation. Tells me he is feeling okay today and has no complaints of pain. States he was brought into the hospital because his kidneys were not functioning well. He denies any changes to his appetite and states he has been eating well, endorses eating "lots of sweets" lately and having some feet swelling which he attributes to these dietary changes. Denies any rectal bleeding or melena. States he moves his bowels easily with no issues. He tells me overall he feels well today.   Last EGD:12/2017- Normal esophagus.                           - Z-line irregular, 40  cm from the incisors.                           - Non-bleeding erosive gastropathy.                           - Normal duodenal bulb and second portion of the                            duodenum.                           - No specimens collected. Last Colonoscopy: 12/2017- Diverticulosis in the sigmoid colon, at the                            hepatic flexure, in the ascending colon and in the                            cecum.                           - External hemorrhoids.                           -  No specimens collected.                           Comment: Colonic diverticular bleed.   Past Medical History:  Diagnosis Date   Arthritis    Cancer [facility])    Prostate   COPD (chronic obstructive pulmonary disease) (HCC)    Diverticulitis    Hypertension    Lower GI bleed 12/27/2017    Past Surgical History:  Procedure Laterality Date   CATARACT EXTRACTION W/PHACO Left 11/16/2012   Procedure: CATARACT EXTRACTION PHACO AND INTRAOCULAR LENS PLACEMENT (IOC);  Surgeon: Gemma Payor, MD;  Location: [facility];  Service: Ophthalmology;  Laterality: Left;  CDE: 13.66   CATARACT EXTRACTION W/PHACO Right 01/18/2013   Procedure: CATARACT EXTRACTION PHACO AND INTRAOCULAR LENS PLACEMENT (IOC);  Surgeon: Gemma Payor, MD;  Location: [facility];  Service: Ophthalmology;  Laterality: Right;  CDE:8.79   CERVICAL FUSION  4 yrs ago   COLONOSCOPY N/A 12/28/2017   Procedure: COLONOSCOPY;  Surgeon: Malissa Hippo, MD;  Location: [facility];  Service: Endoscopy;  Laterality: N/A;   COLONOSCOPY N/A 01/01/2018   Procedure: COLONOSCOPY;  Surgeon: Malissa Hippo, MD;  Location: [facility];  Service: Endoscopy;  Laterality: N/A;   ESOPHAGOGASTRODUODENOSCOPY N/A 01/01/2018   Procedure: ESOPHAGOGASTRODUODENOSCOPY (EGD);  Surgeon: Malissa Hippo, MD;  Location: [facility];  Service: Endoscopy;  Laterality: N/A;   INGUINAL HERNIA REPAIR Right 03/12/2021   Procedure: HERNIA REPAIR INGUINAL ADULT;  Surgeon:  Franky Macho, MD;  Location: [facility];  Service: General;  Laterality: Right;   JOINT REPLACEMENT Left 5 yrs ago   knee   PARTIAL COLECTOMY N/A 01/01/2018   Procedure: PARTIAL COLECTOMY;  Surgeon: Franky Macho, MD;  Location: [facility];  Service: General;  Laterality: N/A;   right hip replacement Right 3 yrs ago    Prior to Admission medications   Medication Sig Start Date End Date Taking? Authorizing Provider  albuterol (PROVENTIL) (2.5 MG/3ML) 0.083% nebulizer solution Take 3 mLs (2.5 mg total) by nebulization every 6 (six) hours as needed for wheezing or shortness of breath. 04/14/23   Vassie Loll, MD  B Complex-C (B-COMPLEX WITH VITAMIN C) tablet Take 1 tablet by mouth daily. 04/15/23   Vassie Loll, MD  Cholecalciferol 25 MCG (1000 UT) capsule Take 1 capsule (1,000 Units total) by mouth daily. 04/14/23   Vassie Loll, MD  furosemide (LASIX) 20 MG tablet Take 20 mg by mouth daily.    [provider]  loratadine (CLARITIN) 10 MG tablet Take 1 tablet (10 mg total) by mouth daily. 04/15/23   Vassie Loll, MD  LUMIGAN 0.01 % SOLN Place 1 drop into both eyes at bedtime. 04/14/23   Vassie Loll, MD  Omega-3 Fatty Acids (FISH OIL) 1200 MG CAPS Take 1 capsule (1,200 mg total) by mouth daily. 04/14/23   Vassie Loll, MD  umeclidinium bromide (INCRUSE ELLIPTA) 62.5 MCG/ACT AEPB Inhale 1 puff into the lungs daily. 04/15/23   Vassie Loll, MD    Current Facility-Administered Medications  Medication Dose Route Frequency Provider Last Rate Last Admin   albuterol (PROVENTIL) (2.5 MG/3ML) 0.083% nebulizer solution 2.5 mg  2.5 mg Nebulization Q6H PRN Steffanie Rainwater, MD       B-complex with vitamin C tablet 1 tablet  1 tablet Oral Daily Steffanie Rainwater, MD   1 tablet at 04/23/23 1022   cholecalciferol (VITAMIN D3) 25 MCG (1000 UNIT) tablet 1,000 Units  1,000 Units Oral Daily  Steffanie Rainwater, MD   1,000 Units at 04/23/23 1022   heparin ADULT infusion 100 units/mL (25000  units/259mL)  1,650 Units/hr Intravenous Continuous Steffanie Rainwater, MD 16.5 mL/hr at 04/23/23 1032 1,650 Units/hr at 04/23/23 1032   ibuprofen (ADVIL) tablet 400 mg  400 mg Oral Q6H PRN Steffanie Rainwater, MD   400 mg at 04/23/23 0135   latanoprost (XALATAN) 0.005 % ophthalmic solution 1 drop  1 drop Both Eyes QHS Steffanie Rainwater, MD   1 drop at 04/22/23 2339   loratadine (CLARITIN) tablet 10 mg  10 mg Oral Daily Steffanie Rainwater, MD   10 mg at 04/23/23 1022   ondansetron (ZOFRAN) tablet 4 mg  4 mg Oral Q6H PRN Steffanie Rainwater, MD       Or   ondansetron [facility]) injection 4 mg  4 mg Intravenous Q6H PRN Steffanie Rainwater, MD       piperacillin-tazobactam (ZOSYN) IVPB 3.375 g  3.375 g Intravenous Q8H Hunt, Madison H, RPH 12.5 mL/hr at 04/23/23 0502 3.375 g at 04/23/23 0502   senna-docusate (Senokot-S) tablet 1 tablet  1 tablet Oral QHS PRN Steffanie Rainwater, MD       umeclidinium bromide (INCRUSE ELLIPTA) 62.5 MCG/ACT 1 puff  1 puff Inhalation Daily Steffanie Rainwater, MD        Allergies as of 04/22/2023   (No Known Allergies)    Family History  Problem Relation Age of Onset   Diabetes Mother    Dementia Father     Social History   Socioeconomic History   Marital status: Married    Spouse name: Not on file   Number of children: Not on file   Years of education: Not on file   Highest education level: Not on file  Occupational History   Not on file  Tobacco Use   Smoking status: Former    Types: Cigars   Smokeless tobacco: Never  Vaping Use   Vaping status: Never Used  Substance and Sexual Activity   Alcohol use: No   Drug use: No   Sexual activity: Not Currently  Other Topics Concern   Not on file  Social History Narrative   Not on file   Social Drivers of Health   Financial Resource Strain: Low Risk  (11/30/2022)   Received from [facility]   Overall Financial Resource Strain (CARDIA)    Difficulty of Paying Living Expenses: Not very hard   Food Insecurity: No Food Insecurity (04/23/2023)   Hunger Vital Sign    Worried About Running Out of Food in the Last Year: Never true    Ran Out of Food in the Last Year: Never true  Recent Concern: Food Insecurity - Food Insecurity Present (04/22/2023)   Hunger Vital Sign    Worried About Running Out of Food in the Last Year: Never true    Ran Out of Food in the Last Year: Sometimes true  Transportation Needs: No Transportation Needs (04/23/2023)   PRAPARE - Administrator, Civil Service (Medical): No    Lack of Transportation (Non-Medical): No  Physical Activity: Unknown (12/27/2017)   Exercise Vital Sign    Days of Exercise per Week: Patient declined    Minutes of Exercise per Session: Patient declined  Stress: Not on file  Social Connections: Unknown (04/23/2023)   Social Connection and Isolation Panel [NHANES]    Frequency of Communication with Friends and Family: Once a week    Frequency of  Social Gatherings with Friends and Family: Once a week    Attends Religious Services: 1 to 4 times per year    Active Member of [employer] or Organizations: No    Attends Banker Meetings: 1 to 4 times per year    Marital Status: Patient unable to answer  Intimate Partner Violence: Not At Risk (04/23/2023)   Humiliation, Afraid, Rape, and Kick questionnaire    Fear of Current or Ex-Partner: No    Emotionally Abused: No    Physically Abused: No    Sexually Abused: No     Review of Systems   Gen: Denies any fever, chills, loss of appetite, change in weight or weight loss CV: Denies chest pain, heart palpitations, syncope, edema  Resp: Denies shortness of breath with rest, cough, wheezing, coughing up blood, and pleurisy. GI:  denies melena, hematochezia, nausea, vomiting, diarrhea, constipation, dysphagia, odyonophagia, early satiety or weight loss.  GU : Denies urinary burning, blood in urine, urinary frequency, and urinary incontinence. MS: Denies limitation of  movement, cramps, and atrophy. +swelling to feet + pain to knees  Derm: Denies rash, itching, dry skin, hives. Psych: Denies depression, anxiety, memory loss, hallucinations, and confusion. Heme: Denies bruising or bleeding Neuro:  Denies any headaches, dizziness, paresthesias, shaking  Physical Exam   General:   Alert,  Well-developed, well-nourished, pleasant and cooperative in NAD Head:  Normocephalic and atraumatic. Eyes:  Sclera clear, no icterus.   Conjunctiva pink. Ears:  Normal auditory acuity. Mouth:  No deformity or lesions, dentition normal. Neck:  Supple; no masses Lungs:  Clear throughout to auscultation.   No wheezes, crackles, or rhonchi. No acute distress. Heart:  Regular rate and rhythm; no murmurs, clicks, rubs,  or gallops. Abdomen:  Soft, nontender and nondistended. No masses, hepatosplenomegaly or hernias noted. Normal bowel sounds, without guarding, and without rebound.   Msk:  Symmetrical without gross deformities. Normal posture. Extremities:  Without clubbing or edema. Neurologic:  Alert and  oriented to month and person Skin:  Intact without significant lesions or rashes. Psych:  Alert and cooperative. Normal mood and affect.  Intake/Output from previous day: 01/28 0701 - 01/29 0700 In: 1342.5 [I.V.:335.8; IV Piggyback:1006.7] Out: -  Intake/Output this shift: Total I/O In: 240 [P.O.:240] Out: -    Labs/Studies   Recent Labs Recent Labs    04/22/23 1439 04/23/23 0441  WBC 13.2* 12.1*  HGB 10.6* 9.3*  HCT 33.6* 28.9*  PLT 231 204   BMET Recent Labs    04/22/23 1439 04/23/23 0441  NA 135 137  K 3.3* 3.4*  CL 103 108  CO2 21* 20*  GLUCOSE 125* 101*  BUN 22 23  CREATININE 0.95 0.96  CALCIUM 8.6* 8.2*   LFT Recent Labs    04/22/23 1439 04/23/23 0441  PROT 7.2 6.1*  ALBUMIN 2.4* 2.1*  AST 183* 101*  ALT 177* 139*  ALKPHOS 84 75  BILITOT 2.5* 2.4*   PT/INR Recent Labs    04/22/23 1948  LABPROT 15.7*  INR 1.2   Hepatitis  Panel Recent Labs    04/22/23 1835  HEPBSAG NON REACTIVE  HCVAB NON REACTIVE  HEPAIGM NON REACTIVE  HEPBIGM NON REACTIVE   C-Diff No results for input(s): "CDIFFTOX" in the last 72 hours.  Radiology/Studies CT ABDOMEN PELVIS W CONTRAST Result Date: 04/22/2023 CLINICAL DATA:  Acute abdominal pain. Patient reports fever and body aches. EXAM: CT ABDOMEN AND PELVIS WITH CONTRAST TECHNIQUE: Multidetector CT imaging of the abdomen and pelvis was performed using  the standard protocol following bolus administration of intravenous contrast. RADIATION DOSE REDUCTION: This exam was performed according to the departmental dose-optimization program which includes automated exposure control, adjustment of the mA and/or kV according to patient size and/or use of iterative reconstruction technique. CONTRAST:  OMNIPAQUE IOHEXOL 350 MG/ML SOLN COMPARISON:  CT 01/31/2021 FINDINGS: Lower chest: Assessed on concurrent chest CTA, reported separately. Hepatobiliary: Benign cyst in the subcapsular right lobe of the liver. No suspicious liver lesion. Unremarkable gallbladder. There is mild motion artifact which limits detailed assessment. Pancreas: Parenchymal atrophy. No ductal dilatation or inflammation. Spleen: Normal in size without focal abnormality. Adrenals/Urinary Tract: No adrenal nodule. No hydronephrosis or perinephric edema. Homogeneous renal enhancement with symmetric excretion on delayed phase imaging. There are multiple bilateral renal cysts. The largest cyst is in the right mid upper kidney measuring 10.5 cm. No further follow-up imaging is recommended. Urinary bladder is nondistended and not well assessed due to streak artifact from right hip arthroplasty. Stomach/Bowel: Midline ventral abdominal wall hernia contains short segment of small bowel. There is no associated wall thickening or obstruction. Right hemicolectomy. Colonic diverticulosis without diverticulitis. Moderate volume of colonic stool.  Previous right inguinal hernia has been repaired. Vascular/Lymphatic: Aortic atherosclerosis without aneurysm. 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 No enlarged lymph nodes in the abdomen or pelvis. Reproductive: Fiducial markers or clips in the prostate. Other: Postsurgical change in the right inguinal canal, previous right inguinal hernia has been repaired. Midline ventral abdominal wall hernia contains short segment of small bowel without inflammation or obstruction. No ascites or free air. Musculoskeletal: Right hip arthroplasty. Chronic left hip arthropathy with flattening of the femoral head, near complete joint space loss, subchondral cystic change and sclerosis. This is similar in appearance to 11/27/2022 pelvis radiograph. There is a left hip joint effusion. Multilevel degenerative change in the lumbar spine. IMPRESSION: 1. 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 2. Midline ventral abdominal wall hernia contains short segment of small bowel without inflammation or obstruction. 3. Colonic diverticulosis without diverticulitis. 4. Chronic left hip arthropathy with flattening of the femoral head, near complete joint space loss, subchondral cystic change and sclerosis. There is a left hip joint effusion. 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 Clinical correlation is recommended in the setting of fever for hip infection, although chronic changes were seen on September 2024 radiograph and appear similar. Aortic Atherosclerosis (ICD10-I70.0). These results were called by telephone at the time of interpretation on 04/22/2023 at 6:57 pm to provider Gloris Manchester , who verbally acknowledged these results. Electronically Signed   By: Narda Rutherford M.D.   On: 04/22/2023 18:58   CT Angio Chest PE W and/or Wo Contrast Result Date: 04/22/2023 CLINICAL DATA:  Pulmonary embolism (PE) suspected, high prob Patient presents with fever and  body aches. EXAM: CT ANGIOGRAPHY CHEST WITH CONTRAST TECHNIQUE: Multidetector CT imaging of the chest was performed using the standard protocol during bolus administration of intravenous contrast. Multiplanar CT image reconstructions and MIPs were obtained to evaluate the vascular anatomy. Performed in conjunction with CT of the abdomen and pelvis. RADIATION DOSE REDUCTION: This exam was performed according to the departmental dose-optimization program which includes automated exposure control, adjustment of the mA and/or kV according to patient size and/or use of iterative reconstruction technique. CONTRAST:  OMNIPAQUE IOHEXOL 350 MG/ML SOLN COMPARISON:  Chest radiograph earlier today. FINDINGS: Cardiovascular: There are no filling defects within the pulmonary arteries to suggest pulmonary embolus. Dilated  main pulmonary artery at 3.7 cm. The thoracic aorta is tortuous. No acute aortic finding. The heart is upper normal in size. No pericardial effusion. Mediastinum/Nodes: No mediastinal or hilar adenopathy. Unremarkable esophagus. No thyroid nodule. Lungs/Pleura: Mild lower lobe bronchial thickening. Bandlike right lower lobe opacities favor atelectasis. There is no confluent airspace disease. No pleural fluid. No pulmonary mass or suspicious nodule. No features of pulmonary edema. Trachea and central most airways are clear. Upper Abdomen: Assessed on concurrent abdominopelvic CT, reported separately. Musculoskeletal: Diffuse thoracic spondylosis with anterior spurring, bulky anterior osteophytes in the lower cervical spine. Mild broad-based dextroscoliotic curvature. The right shoulder is dislocated with the humeral head directed anteriorly and flattening of the glenoid. There are erosive changes involving the shoulder joint which are suboptimally assessed this is a change from 11/05/2021 shoulder CT. Review of the MIP images confirms the above findings. IMPRESSION: 1. No pulmonary embolus. 2. Mild lower  lobe bronchial thickening, can be seen with bronchitis or reactive airways disease. 3. Right shoulder dislocation with the humeral head directed anteriorly and flattening of the glenoid. There are erosive changes involving the shoulder joint which are suboptimally assessed. This is a change from 11/05/2021 shoulder CT. Recommend correlation with history of shoulder pain. 4. Dilated main pulmonary artery, can be seen with pulmonary arterial hypertension. Electronically Signed   By: Narda Rutherford M.D.   On: 04/22/2023 18:47   US Abdomen Limited RUQ (LIVER/GB) Result Date: 04/22/2023 CLINICAL DATA:  Fever. EXAM: ULTRASOUND ABDOMEN LIMITED RIGHT UPPER QUADRANT COMPARISON:  CT of the abdomen pelvis dated 01/31/2021. FINDINGS: Gallbladder: No gallstones or wall thickening visualized. No sonographic Murphy sign noted by sonographer. Common bile duct: Diameter: 7 mm Liver: The liver demonstrates a normal echogenicity. There is a 1.8 x 1.3 x 1.7 cm cyst in the right lobe of the liver also present on the prior CT of 01/31/2021. Portal vein is patent on color Doppler imaging with normal direction of blood flow towards the liver. Other: Partially visualized large right renal cyst measuring up to 11 cm. IMPRESSION: 1. No gallstone. 2. Small right liver cyst. 3. Partially visualized right renal cyst. Electronically Signed   By: Elgie Collard M.D.   On: 04/22/2023 17:27   DG Chest Port 1 View Result Date: 04/22/2023 CLINICAL DATA:  Cough EXAM: PORTABLE CHEST 1 VIEW COMPARISON:  X-ray 04/12/2023 FINDINGS: Slight linear opacity lung bases likely scar or atelectasis. No consolidation, pneumothorax or effusion. No edema. Normal cardiopericardial silhouette. Tortuous ectatic aorta. Degenerative changes of the spine with curvature. Fixation hardware along the lower cervical spine. IMPRESSION: Minimal basilar atelectasis.  Tortuous ectatic aorta. Electronically Signed   By: Karen Kays M.D.   On: 04/22/2023 13:22      Assessment   Randy Russo is a 83 y.o. year old male with history of arthritis, prostate cancer, COPD, hypertension who presented to the ED via EMS from SNF with mild bodyaches, occasional nonproductive cough and fever, Patient found to have portal vein thrombosis, GI consulted for further evaluation  Portal vein thrombosis/elevated LFTs: AST 183, ALT 177 now down to 101 and 139 respectivly. T bili remains relatively the same at 2.4 (2.5). INR last night was 1.2. he was started on heparin. Acute hepatitis panel is pending. No history of liver disease and no evidence of cirrhosis noted on recent imaging. CT angio was negative for PE. He has benign appearing cyst in subcapsular R love of the liver but no suspicious lesions noted. There is concern for septic thrombophlebitis in  setting of systemic symptoms to include fever. Unclear etiology of his PVT at this time though likely secondary to inflammatory/infectious states.  He denies abdominal pain, no ascites on exam. Would recommend continuing heparin, ultimately will need to be transitioned to PO ACs. May benefit from hematology evaluation.    Plan / Recommendations   Trend LFTs Follow for acute hep panel results Continue heparin Consider hematology evaluation 5. Continue support measures     04/23/2023, 2:49 PM  Deion Swift L. Jeanmarie Hubert, MSN, APRN, AGNP-C Adult-Gerontology Nurse Practitioner [facility] at [facility]

## 2023-04-23 NOTE — Consult Note (Signed)
PHARMACY - ANTICOAGULATION  Pharmacy Consult for Heparin Indication:  portal vein thrombosis Brief A/P: Heparin level subtherapeutic Increase Heparin rate  No Known Allergies  Patient Measurements: Heparin Dosing Weight: 83.6 kg  Vital Signs: Temp: 98 F (36.7 C) (01/29 0600) Temp Source: Oral (01/29 0600) BP: 102/55 (01/29 0600) Pulse Rate: 89 (01/29 0600)  Labs: Recent Labs    04/22/23 1439 04/22/23 1948 04/23/23 0441  HGB 10.6*  --  9.3*  HCT 33.6*  --  28.9*  PLT 231  --  204  APTT  --  27  --   LABPROT  --  15.7*  --   INR  --  1.2  --   HEPARINUNFRC  --   --  <0.10*  CREATININE 0.95  --  0.96   Estimated Creatinine Clearance: 63 mL/min (by C-G formula based on SCr of 0.96 mg/dL).  Assessment: 83 y.o. male with portal vein thrombosis for heparin  Goal of Therapy:  Heparin level 0.3-0.7 units/ml Monitor platelets by anticoagulation protocol: Yes   Plan:  Heparin 2000 units IV bolus, then increase heparin 1650 units/hr Check heparin level in 8 hours.   Geannie Risen, PharmD, BCPS

## 2023-04-23 NOTE — Progress Notes (Signed)
PROGRESS NOTE    Patient: Randy Russo                            PCP: Assunta Found, MD                    DOB: 1940-04-16            DOA: 04/22/2023 ZOX:[id number]             DOS: 04/23/2023, 11:35 AM   LOS: 1 day   Date of Service: The patient was seen and examined on 04/23/2023  Subjective:   The patient was seen and examined this morning. Hemodynamically stable. No issues overnight .  Brief Narrative:   Randy Russo is a 83 y.o. male with medical history significant for generalized deconditioning, GI bleed, COPD, HTN, recurrent falls and osteoarthritis who presented to the ED via EMS from SNF, [facility], for evaluation of fever and generalized bodyaches.   On evaluation, patient reports feeling weak, generalized muscle aches and stiff.  He reports a chronic nonproductive cough from his COPD but denies any shortness of breath, abdominal pain, chest pain, headache, dizziness, dysuria, melena, nausea, vomiting, hematuria or back pain.   ED Course: Initial vitals showed temp 101.2, RR 18, HR 95, BP 108/62 and SpO2 96% on room air. Labs significant for WBC 13.2  AST/ALT 183/177, bilirubin 2.5, lactic acid 1.8-1.0    CT A/P shows small volume nonocclusive thrombus in the main portal vein CTA chest PE study negative for PE.  CTA chest PE study negative for PE.       Assessment & Plan:   Principal Problem:   Portal vein thrombosis Active Problems:   Acute febrile illness   Elevated liver function tests    Portal vein thrombosis with Fever - Fever, leukocytosis and generalized weakness and muscle aches found to have thrombosis of his portal vein.   No evidence of respiratory, skin or urinary infection.   -LFTs elevated but no radiological abnormalities in the liver or biliary tract. Septic thrombophlebitis is high on differential in the setting of systemic symptoms, elevated WBC, ESR and evidence of portal vein thrombosis in this elderly gentleman. -GI consulted,  appreciate recs  -- Will continue current IV antibiotic of Zosyn -Continue heparin drip --- in anticipation to Eliquis in 24 hours  -All blood cultures    Generalized weakness/recent falls, osteoarthritis, -Chronic history of generalized deconditioning Worsening in the setting of infection -PT/OT eval and treat -Continue vitamin supplementation   Transaminitis    Latest Ref Rng & Units 04/23/2023    4:41 AM 04/22/2023    2:39 PM 04/12/2023    6:22 PM  Hepatic Function  Total Protein 6.5 - 8.1 g/dL 6.1  7.2  7.1   Albumin 3.5 - 5.0 g/dL 2.1  2.4  3.2   AST 15 - 41 U/L 101  183  15   ALT 0 - 44 U/L 139  177  16   Alk Phosphatase 38 - 126 U/L 75  84  60   Total Bilirubin 0.0 - 1.2 mg/dL 2.4  2.5  0.4    -In the setting of portal vein thrombosis, ?  Infection CT does not show any abnormalities of the liver or biliary system. Acetaminophen level slightly elevated to 39 however his elevated LFTs are likely due to thrombus in his portal vein. -GI consulted, appreciate recs -Trend LFTs -Follow-up acute hepatitis labs  Hypokalemia  -Monitoring repleted Checking magnesium  hypertension  -Stable continue home meds  History of GI bleed: H&H stable, monitoring   COPD -No signs of exacerbation -Continue Incruse Ellipta and Claritin -As needed albuterol nebs   --------------------------------------------------------------------------------------------------------------------- Nutritional status:  The patient's BMI is: Body mass index is 32.77 kg/m. I agree with the assessment and plan as outlined below: Nutrition Status:       Skin Assessment: I have examined the patient's skin and I agree with the wound assessment as performed by wound care team As outlined belowe: Pressure Injury 11/01/21 Rectum Right;Lower Stage 2 -  Partial thickness loss of dermis presenting as a shallow open injury with a red, pink wound bed without slough. (Active)  11/01/21 0538  Location:  Rectum  Location Orientation: Right;Lower  Staging: Stage 2 -  Partial thickness loss of dermis presenting as a shallow open injury with a red, pink wound bed without slough.  Wound Description (Comments):   Present on Admission: Yes     ------------------------------------------------------------------------------------------------------------------------------------------------  DVT prophylaxis:  Heparin drip   Code Status:   Code Status: Limited: Do not attempt resuscitation (DNR) -DNR-LIMITED -Do Not Intubate/DNI   Family Communication: No family member present at bedside- -Advance care planning has been discussed.   Admission status:   Status is: Inpatient Remains inpatient appropriate because: Needing IV heparin for portal vein thrombosis GI evaluation   Disposition: From  - home             Planning for discharge in 1-2 days: to   Procedures:   No admission procedures for hospital encounter.   Antimicrobials:  Anti-infectives (From admission, onward)    Start     Dose/Rate Route Frequency Ordered Stop   04/22/23 2000  piperacillin-tazobactam (ZOSYN) IVPB 3.375 g        3.375 g 12.5 mL/hr over 240 Minutes Intravenous Every 8 hours 04/22/23 1938          Medication:   B-complex with vitamin C  1 tablet Oral Daily   cholecalciferol  1,000 Units Oral Daily   latanoprost  1 drop Both Eyes QHS   loratadine  10 mg Oral Daily   umeclidinium bromide  1 puff Inhalation Daily    albuterol, ibuprofen, ondansetron **OR** ondansetron (ZOFRAN) IV, senna-docusate   Objective:   Vitals:   04/23/23 0322 04/23/23 0330 04/23/23 0500 04/23/23 0600  BP: (!) 102/48 (!) 102/48 101/71 (!) 102/55  Pulse: 93 93 88 89  Resp:  (!) 28  20  Temp: (!) 102.1 F (38.9 C) (!) 102.1 F (38.9 C) 98.9 F (37.2 C) 98 F (36.7 C)  TempSrc: Oral Oral Oral Oral  SpO2: 99%  100% 96%  Weight:      Height:  5\' 6"  (1.676 m)      Intake/Output Summary (Last 24 hours) at 04/23/2023  1135 Last data filed at 04/23/2023 1324 Gross per 24 hour  Intake 1582.48 ml  Output --  Net 1582.48 ml   Filed Weights   04/23/23 0050  Weight: 92.1 kg     Physical examination:        General:  AAO x 3,  cooperative, no distress;   HEENT:  Normocephalic, PERRL, otherwise with in Normal limits   Neuro:  CNII-XII intact. , normal motor and sensation, reflexes intact   Lungs:   Clear to auscultation BL, Respirations unlabored,  No wheezes / crackles  Cardio:    S1/S2, RRR, No murmure, No Rubs or Gallops   Abdomen:  Soft, non-tender, bowel sounds active all four quadrants, no guarding or peritoneal signs.  Muscular  skeletal:  Limited exam -global generalized weaknesses - in bed, able to move all 4 extremities,   2+ pulses,  symmetric, No pitting edema  Skin:  Dry, warm to touch, negative for any Rashes,  Wounds: Please see nursing documentation  Pressure Injury 11/01/21 Rectum Right;Lower Stage 2 -  Partial thickness loss of dermis presenting as a shallow open injury with a red, pink wound bed without slough. (Active)  11/01/21 0538  Location: Rectum  Location Orientation: Right;Lower  Staging: Stage 2 -  Partial thickness loss of dermis presenting as a shallow open injury with a red, pink wound bed without slough.  Wound Description (Comments):   Present on Admission: Yes          ------------------------------------------------------------------------------------------------------------------------------------------    LABs:     Latest Ref Rng & Units 04/23/2023    4:41 AM 04/22/2023    2:39 PM 04/13/2023    5:24 AM  CBC  WBC 4.0 - 10.5 K/uL 12.1  13.2  6.3   Hemoglobin 13.0 - 17.0 g/dL 9.3  40.9  81.1   Hematocrit 39.0 - 52.0 % 28.9  33.6  38.6   Platelets 150 - 400 K/uL 204  231  269       Latest Ref Rng & Units 04/23/2023    4:41 AM 04/22/2023    2:39 PM 04/13/2023    5:24 AM  CMP  Glucose 70 - 99 mg/dL 914  782  93   BUN 8 - 23 mg/dL 23  22  20     Creatinine 0.61 - 1.24 mg/dL 9.56  2.13  0.86   Sodium 135 - 145 mmol/L 137  135  138   Potassium 3.5 - 5.1 mmol/L 3.4  3.3  3.8   Chloride 98 - 111 mmol/L 108  103  108   CO2 22 - 32 mmol/L 20  21  24    Calcium 8.9 - 10.3 mg/dL 8.2  8.6  9.2   Total Protein 6.5 - 8.1 g/dL 6.1  7.2    Total Bilirubin 0.0 - 1.2 mg/dL 2.4  2.5    Alkaline Phos 38 - 126 U/L 75  84    AST 15 - 41 U/L 101  183    ALT 0 - 44 U/L 139  177         Micro Results Recent Results (from the past 240 hours)  Resp panel by RT-PCR (RSV, Flu A&B, Covid) Anterior Nasal Swab     Status: None   Collection Time: 04/22/23 12:44 PM   Specimen: Anterior Nasal Swab  Result Value Ref Range Status   SARS Coronavirus 2 by RT PCR NEGATIVE NEGATIVE Final    Comment: (NOTE) SARS-CoV-2 target nucleic acids are NOT DETECTED.  The SARS-CoV-2 RNA is generally detectable in upper respiratory specimens during the acute phase of infection. The lowest concentration of SARS-CoV-2 viral copies this assay can detect is 138 copies/mL. A negative result does not preclude SARS-Cov-2 infection and should not be used as the sole basis for treatment or other patient management decisions. A negative result may occur with  improper specimen collection/handling, submission of specimen other than nasopharyngeal swab, presence of viral mutation(s) within the areas targeted by this assay, and inadequate number of viral copies(<138 copies/mL). A negative result must be combined with clinical observations, patient history, and epidemiological information. The expected result is Negative.  Fact Sheet for Patients:  BloggerCourse.com  Fact Sheet for Healthcare Providers:  SeriousBroker.it  This test is no t yet approved or cleared by the [place] FDA and  has been authorized for detection and/or diagnosis of SARS-CoV-2 by FDA under an Emergency Use Authorization (EUA). This EUA will  remain  in effect (meaning this test can be used) for the duration of the COVID-19 declaration under Section 564(b)(1) of the Act, 21 U.S.C.section 360bbb-3(b)(1), unless the authorization is terminated  or revoked sooner.       Influenza A by PCR NEGATIVE NEGATIVE Final   Influenza B by PCR NEGATIVE NEGATIVE Final    Comment: (NOTE) The Xpert Xpress SARS-CoV-2/FLU/RSV plus assay is intended as an aid in the diagnosis of influenza from Nasopharyngeal swab specimens and should not be used as a sole basis for treatment. Nasal washings and aspirates are unacceptable for Xpert Xpress SARS-CoV-2/FLU/RSV testing.  Fact Sheet for Patients: BloggerCourse.com  Fact Sheet for Healthcare Providers: SeriousBroker.it  This test is not yet approved or cleared by the [place] FDA and has been authorized for detection and/or diagnosis of SARS-CoV-2 by FDA under an Emergency Use Authorization (EUA). This EUA will remain in effect (meaning this test can be used) for the duration of the COVID-19 declaration under Section 564(b)(1) of the Act, 21 U.S.C. section 360bbb-3(b)(1), unless the authorization is terminated or revoked.     Resp Syncytial Virus by PCR NEGATIVE NEGATIVE Final    Comment: (NOTE) Fact Sheet for Patients: BloggerCourse.com  Fact Sheet for Healthcare Providers: SeriousBroker.it  This test is not yet approved or cleared by the [place] FDA and has been authorized for detection and/or diagnosis of SARS-CoV-2 by FDA under an Emergency Use Authorization (EUA). This EUA will remain in effect (meaning this test can be used) for the duration of the COVID-19 declaration under Section 564(b)(1) of the Act, 21 U.S.C. section 360bbb-3(b)(1), unless the authorization is terminated or revoked.  Performed at [facility], [address], [place], [place] [address]   Blood  culture (routine x 2)     Status: None (Preliminary result)   Collection Time: 04/22/23  2:23 PM   Specimen: BLOOD  Result Value Ref Range Status   Specimen Description BLOOD LEFT ANTECUBITAL  Final   Special Requests   Final    BOTTLES DRAWN AEROBIC AND ANAEROBIC Blood Culture results may not be optimal due to an inadequate volume of blood received in culture bottles   Culture   Final    NO GROWTH < 24 HOURS Performed at [facility], [address], [place], [place] [address]    Report Status PENDING  Incomplete  Blood culture (routine x 2)     Status: None (Preliminary result)   Collection Time: 04/22/23  2:39 PM   Specimen: BLOOD  Result Value Ref Range Status   Specimen Description BLOOD BLOOD RIGHT FOREARM  Final   Special Requests   Final    BOTTLES DRAWN AEROBIC AND ANAEROBIC Blood Culture adequate volume   Culture   Final    NO GROWTH < 24 HOURS Performed at [facility], [address] [place], [place] [address]    Report Status PENDING  Incomplete    Radiology Reports CT ABDOMEN PELVIS W CONTRAST Result Date: 04/22/2023 CLINICAL DATA:  Acute abdominal pain. Patient reports fever and body aches. EXAM: CT ABDOMEN AND PELVIS WITH CONTRAST TECHNIQUE: Multidetector CT imaging of the abdomen and pelvis was performed using the standard protocol following bolus administration of intravenous contrast. RADIATION DOSE REDUCTION: This exam  was performed according to the departmental dose-optimization program which includes automated exposure control, adjustment of the mA and/or kV according to patient size and/or use of iterative reconstruction technique. CONTRAST:  OMNIPAQUE IOHEXOL 350 MG/ML SOLN COMPARISON:  CT 01/31/2021 FINDINGS: Lower chest: Assessed on concurrent chest CTA, reported separately. Hepatobiliary: Benign cyst in the subcapsular right lobe of the liver. No suspicious liver lesion. Unremarkable gallbladder. There is mild motion artifact which limits detailed  assessment. Pancreas: Parenchymal atrophy. No ductal dilatation or inflammation. Spleen: Normal in size without focal abnormality. Adrenals/Urinary Tract: No adrenal nodule. No hydronephrosis or perinephric edema. Homogeneous renal enhancement with symmetric excretion on delayed phase imaging. There are multiple bilateral renal cysts. The largest cyst is in the right mid upper kidney measuring 10.5 cm. No further follow-up imaging is recommended. Urinary bladder is nondistended and not well assessed due to streak artifact from right hip arthroplasty. Stomach/Bowel: Midline ventral abdominal wall hernia contains short segment of small bowel. There is no associated wall thickening or obstruction. Right hemicolectomy. Colonic diverticulosis without diverticulitis. Moderate volume of colonic stool. Previous right inguinal hernia has been repaired. Vascular/Lymphatic: Aortic atherosclerosis without aneurysm. 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 No enlarged lymph nodes in the abdomen or pelvis. Reproductive: Fiducial markers or clips in the prostate. Other: Postsurgical change in the right inguinal canal, previous right inguinal hernia has been repaired. Midline ventral abdominal wall hernia contains short segment of small bowel without inflammation or obstruction. No ascites or free air. Musculoskeletal: Right hip arthroplasty. Chronic left hip arthropathy with flattening of the femoral head, near complete joint space loss, subchondral cystic change and sclerosis. This is similar in appearance to 11/27/2022 pelvis radiograph. There is a left hip joint effusion. Multilevel degenerative change in the lumbar spine. IMPRESSION: 1. 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 2. Midline ventral abdominal wall hernia contains short segment of small bowel without inflammation or obstruction. 3. Colonic diverticulosis without diverticulitis. 4. Chronic left hip  arthropathy with flattening of the femoral head, near complete joint space loss, subchondral cystic change and sclerosis. There is a left hip joint effusion. 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 Clinical correlation is recommended in the setting of fever for hip infection, although chronic changes were seen on September 2024 radiograph and appear similar. Aortic Atherosclerosis (ICD10-I70.0). These results were called by telephone at the time of interpretation on 04/22/2023 at 6:57 pm to provider Gloris Manchester , who verbally acknowledged these results. Electronically Signed   By: Narda Rutherford M.D.   On: 04/22/2023 18:58   CT Angio Chest PE W and/or Wo Contrast Result Date: 04/22/2023 CLINICAL DATA:  Pulmonary embolism (PE) suspected, high prob Patient presents with fever and body aches. EXAM: CT ANGIOGRAPHY CHEST WITH CONTRAST TECHNIQUE: Multidetector CT imaging of the chest was performed using the standard protocol during bolus administration of intravenous contrast. Multiplanar CT image reconstructions and MIPs were obtained to evaluate the vascular anatomy. Performed in conjunction with CT of the abdomen and pelvis. RADIATION DOSE REDUCTION: This exam was performed according to the departmental dose-optimization program which includes automated exposure control, adjustment of the mA and/or kV according to patient size and/or use of iterative reconstruction technique. CONTRAST:  OMNIPAQUE IOHEXOL 350 MG/ML SOLN COMPARISON:  Chest radiograph earlier today. FINDINGS: Cardiovascular: There are no filling defects within the pulmonary arteries to suggest pulmonary embolus. Dilated main pulmonary artery at 3.7 cm. The thoracic aorta is tortuous. No acute aortic  finding. The heart is upper normal in size. No pericardial effusion. Mediastinum/Nodes: No mediastinal or hilar adenopathy. Unremarkable esophagus. No thyroid nodule. Lungs/Pleura: Mild lower lobe bronchial thickening. Bandlike right  lower lobe opacities favor atelectasis. There is no confluent airspace disease. No pleural fluid. No pulmonary mass or suspicious nodule. No features of pulmonary edema. Trachea and central most airways are clear. Upper Abdomen: Assessed on concurrent abdominopelvic CT, reported separately. Musculoskeletal: Diffuse thoracic spondylosis with anterior spurring, bulky anterior osteophytes in the lower cervical spine. Mild broad-based dextroscoliotic curvature. The right shoulder is dislocated with the humeral head directed anteriorly and flattening of the glenoid. There are erosive changes involving the shoulder joint which are suboptimally assessed this is a change from 11/05/2021 shoulder CT. Review of the MIP images confirms the above findings. IMPRESSION: 1. No pulmonary embolus. 2. Mild lower lobe bronchial thickening, can be seen with bronchitis or reactive airways disease. 3. Right shoulder dislocation with the humeral head directed anteriorly and flattening of the glenoid. There are erosive changes involving the shoulder joint which are suboptimally assessed. This is a change from 11/05/2021 shoulder CT. Recommend correlation with history of shoulder pain. 4. Dilated main pulmonary artery, can be seen with pulmonary arterial hypertension. Electronically Signed   By: Narda Rutherford M.D.   On: 04/22/2023 18:47   US Abdomen Limited RUQ (LIVER/GB) Result Date: 04/22/2023 CLINICAL DATA:  Fever. EXAM: ULTRASOUND ABDOMEN LIMITED RIGHT UPPER QUADRANT COMPARISON:  CT of the abdomen pelvis dated 01/31/2021. FINDINGS: Gallbladder: No gallstones or wall thickening visualized. No sonographic Murphy sign noted by sonographer. Common bile duct: Diameter: 7 mm Liver: The liver demonstrates a normal echogenicity. There is a 1.8 x 1.3 x 1.7 cm cyst in the right lobe of the liver also present on the prior CT of 01/31/2021. Portal vein is patent on color Doppler imaging with normal direction of blood flow towards the liver.  Other: Partially visualized large right renal cyst measuring up to 11 cm. IMPRESSION: 1. No gallstone. 2. Small right liver cyst. 3. Partially visualized right renal cyst. Electronically Signed   By: Elgie Collard M.D.   On: 04/22/2023 17:27   DG Chest Port 1 View Result Date: 04/22/2023 CLINICAL DATA:  Cough EXAM: PORTABLE CHEST 1 VIEW COMPARISON:  X-ray 04/12/2023 FINDINGS: Slight linear opacity lung bases likely scar or atelectasis. No consolidation, pneumothorax or effusion. No edema. Normal cardiopericardial silhouette. Tortuous ectatic aorta. Degenerative changes of the spine with curvature. Fixation hardware along the lower cervical spine. IMPRESSION: Minimal basilar atelectasis.  Tortuous ectatic aorta. Electronically Signed   By: Karen Kays M.D.   On: 04/22/2023 13:22    SIGNED: Kendell Bane, MD, FHM. FAAFP. [facility] - [facility] Time spent - 55 min.  In seeing, evaluating and examining the patient. Reviewing medical records, labs, drawn plan of care. [facility],  Pager (please use [email or web address] to page/ text) Please use Epic Secure Chat for non-urgent communication (7AM-7PM)  If 7PM-7AM, please contact night-coverage [email or web address], 04/23/2023, 11:35 AM

## 2023-04-23 NOTE — Evaluation (Signed)
Occupational Therapy Evaluation Patient Details Name: Randy Russo MRN: [id number] DOB: 09-Nov-1940 Today's Date: 04/23/2023   History of Present Illness Initial vitals showed temp 101.2, RR 18, HR 95, BP 108/62 and SpO2 96% on room air. Labs significant for WBC 13.2, Hgb 10.6, platelet 231, K+ 3.3, glucose 125, creatinine 0.95, albumin 2.4, AST/ALT 183/177, bilirubin 2.5, lactic acid 1.8-1.0, ESR 80, negative flu, RSV and COVID test, UA shows mild hemoglobinuria and proteinuria but no signs of infection, PT/INR 15.7/1.2, acetaminophen level 39.   CXR with minimal bibasilar atelectasis but no acute abnormalities. RUQ U/S with small right liver cyst but no gallstones. CT A/P shows small volume nonocclusive thrombus in the main portal vein CTA chest PE study negative for PE.  Patient received IV Tylenol 1 g, IV NS 1 L bolus and oral potassium 40 mEq x 1. Patient was started on IV Zosyn and heparin infusion and GI was consulted for evaluation.  TRH consulted for admission (per MD)   Clinical Impression   Pt agreeable to OT evaluation. Pt had already worked with physical therapy and was up sitting in the chair. Pt denied coming from a SNF and reported living alone. Pt reported independence with ADL's. Pt required min A for sit to stand and a couple, very short, steps forward and backward that lasted 5 to 10 seconds before pt returned to sitting in the chair. Pt's R UE is limited for A/ROM of the shoulder at baseline. Generally weak otherwise. Pt reported inability to doff or don socks today with limited reach to B feet. Pt left in the chair with chair alarm set and call bell within reach. Pt will benefit from continued OT in the hospital and recommended venue below to increase strength, balance, and endurance for safe ADL's.         If plan is discharge home, recommend the following: A little help with walking and/or transfers;A lot of help with bathing/dressing/bathroom;Assistance with  cooking/housework;Assist for transportation;Help with stairs or ramp for entrance;Direct supervision/assist for medications management    Functional Status Assessment  Patient has had a recent decline in their functional status and demonstrates the ability to make significant improvements in function in a reasonable and predictable amount of time.  Equipment Recommendations  None recommended by OT           Precautions / Restrictions Precautions Precautions: Fall Restrictions Weight Bearing Restrictions Per Provider Order: No      Mobility Bed Mobility               General bed mobility comments: Up in recliner on entry    Transfers Overall transfer level: Needs assistance Equipment used: Rolling walker (2 wheels) Transfers: Sit to/from Stand, Bed to chair/wheelchair/BSC Sit to Stand: Min assist           General transfer comment: Extended time and labored effort to push up from chair.      Balance Overall balance assessment: Needs assistance Sitting-balance support: Feet supported, Bilateral upper extremity supported Sitting balance-Leahy Scale: Good Sitting balance - Comments: seated in chair   Standing balance support: Bilateral upper extremity supported, During functional activity, Reliant on assistive device for balance Standing balance-Leahy Scale: Poor Standing balance comment: poor to fair with RW                           ADL either performed or assessed with clinical judgement   ADL Overall ADL's : Needs assistance/impaired Eating/Feeding: Independent;Sitting  Grooming: Contact guard assist;Minimal assistance;Standing   Upper Body Bathing: Set up;Sitting   Lower Body Bathing: Maximal assistance;Sitting/lateral leans Lower Body Bathing Details (indicate cue type and reason): Pt reports he cannot don socks; limited reach to B feet. Upper Body Dressing : Set up;Sitting   Lower Body Dressing: Maximal assistance;Sitting/lateral  leans Lower Body Dressing Details (indicate cue type and reason): Pt reports he cannot don socks; limited reach to B feet. Toilet Transfer: Minimal assistance;Ambulation;Rolling walker (2 wheels) Toilet Transfer Details (indicate cue type and reason): Simulated via sit to stand and very short ambulation away and back to bed. Only a couple steps. Toileting- Clothing Manipulation and Hygiene: Minimal assistance;Moderate assistance;Sitting/lateral lean       Functional mobility during ADLs: Minimal assistance;Rolling walker (2 wheels) General ADL Comments: Able to take a couple steps while standing. Pt seemed quicked to fatigue due to returning to chair after only 5 to 10 seconds.     Vision Baseline Vision/History: 1 Wears glasses Ability to See in Adequate Light: 1 Impaired Patient Visual Report: No change from baseline                         Pertinent Vitals/Pain Pain Assessment Pain Assessment: No/denies pain     Extremity/Trunk Assessment Upper Extremity Assessment Upper Extremity Assessment: RUE deficits/detail;LUE deficits/detail RUE Deficits / Details: 2-/5 shoulder flexion from previous injury. LUE Deficits / Details: Generally weak ; 4/5 grossly.   Lower Extremity Assessment Lower Extremity Assessment: Defer to PT evaluation   Cervical / Trunk Assessment Cervical / Trunk Assessment: Kyphotic   Communication Communication Communication: No apparent difficulties   Cognition Arousal: Alert Behavior During Therapy: WFL for tasks assessed/performed Overall Cognitive Status: No family/caregiver present to determine baseline cognitive functioning                                                        Home Living Family/patient expects to be discharged to:: Skilled nursing facility                             Home Equipment: Agricultural consultant (2 wheels);Wheelchair - manual   Additional Comments: From [facility] per chart  review.      Prior Functioning/Environment Prior Level of Function : Independent/Modified Independent             Mobility Comments: Pt reports ue of w/c mostly. Use of RW for transfer to w/c. ADLs Comments: Pt reports independent ADL but also denied coming from SNF. Unsure of reliabiilty.        OT Problem List: Decreased strength;Decreased range of motion;Decreased activity tolerance;Impaired balance (sitting and/or standing);Decreased coordination;Decreased safety awareness;Impaired UE functional use      OT Treatment/Interventions: Self-care/ADL training;Therapeutic exercise;Energy conservation;DME and/or AE instruction;Manual therapy;Therapeutic activities;Cognitive remediation/compensation;Balance training;Patient/family education    OT Goals(Current goals can be found in the care plan section) Acute Rehab OT Goals Patient Stated Goal: Imrpove strength at rehab. OT Goal Formulation: With patient Time For Goal Achievement: 05/07/23 Potential to Achieve Goals: Good  OT Frequency: Min 1X/week                                   End of  Session Equipment Utilized During Treatment: Rolling walker (2 wheels)  Activity Tolerance: Patient tolerated treatment well Patient left: in chair;with call bell/phone within reach;with chair alarm set  OT Visit Diagnosis: Unsteadiness on feet (R26.81);Other abnormalities of gait and mobility (R26.89);Muscle weakness (generalized) (M62.81)                Time: 1610-9604 OT Time Calculation (min): 8 min Charges:  OT General Charges $OT Visit: 1 Visit OT Evaluation $OT Eval Low Complexity: 1 Low  Daphney Hopke OT, MOT   Danie Chandler 04/23/2023, 11:41 AM

## 2023-04-23 NOTE — Progress Notes (Signed)
   04/23/23 0322  Vitals  Temp (!) 102.1 F (38.9 C)  Temp Source Oral  BP (!) 102/48  MAP (mmHg) 66  BP Location Right Arm  BP Method Automatic  Patient Position (if appropriate) Lying  Pulse Rate 93  Pulse Rate Source Dinamap  Level of Consciousness  Level of Consciousness Alert  MEWS COLOR  MEWS Score Color Red  Oxygen Therapy  SpO2 99 %  O2 Device Nasal Cannula  O2 Flow Rate (L/min) 1 L/min  Pain Assessment  Pain Scale 0-10  Pain Score 0  MEWS Score  MEWS Temp 2  MEWS Systolic 0  MEWS Pulse 0  MEWS RR 2  MEWS LOC 0  MEWS Score 4

## 2023-04-24 ENCOUNTER — Telehealth (HOSPITAL_COMMUNITY): Payer: Self-pay | Admitting: Pharmacy Technician

## 2023-04-24 ENCOUNTER — Other Ambulatory Visit (HOSPITAL_COMMUNITY): Payer: Self-pay

## 2023-04-24 DIAGNOSIS — I81 Portal vein thrombosis: Secondary | ICD-10-CM | POA: Diagnosis not present

## 2023-04-24 DIAGNOSIS — R509 Fever, unspecified: Secondary | ICD-10-CM | POA: Diagnosis not present

## 2023-04-24 DIAGNOSIS — Z515 Encounter for palliative care: Secondary | ICD-10-CM | POA: Diagnosis not present

## 2023-04-24 DIAGNOSIS — D649 Anemia, unspecified: Secondary | ICD-10-CM | POA: Diagnosis not present

## 2023-04-24 DIAGNOSIS — R7989 Other specified abnormal findings of blood chemistry: Secondary | ICD-10-CM | POA: Diagnosis not present

## 2023-04-24 DIAGNOSIS — Z7189 Other specified counseling: Secondary | ICD-10-CM | POA: Diagnosis not present

## 2023-04-24 LAB — HEPARIN LEVEL (UNFRACTIONATED): Heparin Unfractionated: <0.1 [IU]/mL — ABNORMAL LOW (ref 0.30–0.70)

## 2023-04-24 LAB — CBC
HCT: 29.7 % — ABNORMAL LOW (ref 39.0–52.0)
Hemoglobin: 9.5 g/dL — ABNORMAL LOW (ref 13.0–17.0)
MCH: 26.5 pg (ref 26.0–34.0)
MCHC: 32 g/dL (ref 30.0–36.0)
MCV: 83 fL (ref 80.0–100.0)
Platelets: 229 10*3/uL (ref 150–400)
RBC: 3.58 MIL/uL — ABNORMAL LOW (ref 4.22–5.81)
RDW: 19.9 % — ABNORMAL HIGH (ref 11.5–15.5)
WBC: 12.7 10*3/uL — ABNORMAL HIGH (ref 4.0–10.5)
nRBC: 0 % (ref 0.0–0.2)

## 2023-04-24 LAB — IRON AND TIBC
Iron: 19 ug/dL — ABNORMAL LOW (ref 45–182)
Saturation Ratios: 11 % — ABNORMAL LOW (ref 17.9–39.5)
TIBC: 170 ug/dL — ABNORMAL LOW (ref 250–450)
UIBC: 151 ug/dL

## 2023-04-24 LAB — FERRITIN: Ferritin: 300 ng/mL (ref 24–336)

## 2023-04-24 LAB — RETIC PANEL
Immature Retic Fract: 17.3 % — ABNORMAL HIGH (ref 2.3–15.9)
RBC.: 3.71 MIL/uL — ABNORMAL LOW (ref 4.22–5.81)
Retic Count, Absolute: 27.1 10*3/uL (ref 19.0–186.0)
Retic Ct Pct: 0.7 % (ref 0.4–3.1)
Reticulocyte Hemoglobin: 23.7 pg — ABNORMAL LOW (ref >27.9)

## 2023-04-24 LAB — FOLATE: Folate: 16.9 ng/mL (ref >5.9)

## 2023-04-24 LAB — LACTATE DEHYDROGENASE: LDH: 142 U/L (ref 98–192)

## 2023-04-24 LAB — VITAMIN B12: Vitamin B-12: 966 pg/mL — ABNORMAL HIGH (ref 180–914)

## 2023-04-24 MED ORDER — FERROUS SULFATE 325 (65 FE) MG PO TABS
325.0000 mg | ORAL_TABLET | Freq: Two times a day (BID) | ORAL | Status: DC
  Administered 2023-04-24 – 2023-04-25 (×2): 325 mg via ORAL
  Filled 2023-04-24 (×2): qty 1

## 2023-04-24 MED ORDER — HEPARIN BOLUS VIA INFUSION
2000.0000 [IU] | Freq: Once | INTRAVENOUS | Status: AC
  Administered 2023-04-24: 2000 [IU] via INTRAVENOUS
  Filled 2023-04-24: qty 2000

## 2023-04-24 MED ORDER — APIXABAN 5 MG PO TABS
5.0000 mg | ORAL_TABLET | Freq: Two times a day (BID) | ORAL | Status: DC
Start: 2023-05-01 — End: 2023-04-25

## 2023-04-24 MED ORDER — APIXABAN 5 MG PO TABS
10.0000 mg | ORAL_TABLET | Freq: Two times a day (BID) | ORAL | Status: DC
  Administered 2023-04-24 – 2023-04-25 (×3): 10 mg via ORAL
  Filled 2023-04-24 (×3): qty 2

## 2023-04-24 MED ORDER — DOCUSATE SODIUM 100 MG PO CAPS
200.0000 mg | ORAL_CAPSULE | Freq: Every day | ORAL | Status: DC
  Administered 2023-04-24 – 2023-04-25 (×2): 200 mg via ORAL
  Filled 2023-04-24 (×2): qty 2

## 2023-04-24 NOTE — Progress Notes (Signed)
Gastroenterology Progress Note   Referring Provider: No ref. provider found Primary Care Physician:  Assunta Found, MD Primary Gastroenterologist:  Katrinka Blazing, MD   Patient ID: Randy Russo; [id number]; Nov 03, 1940   Subjective:    Pleasant, sitting up in chair. Per nursing eating well and had 3 BMs yesterday.   Objective:   Vital signs in last 24 hours: Temp:  [97.9 F (36.6 C)-100.4 F (38 C)] 98.5 F (36.9 C) (01/30 0443) Pulse Rate:  [70-88] 79 (01/30 0443) Resp:  [16-19] 16 (01/30 0443) BP: (89-125)/(46-75) 125/75 (01/30 0443) SpO2:  [97 %-100 %] 99 % (01/30 0915) Last BM Date :  (unknown) General:   Alert,  Well-developed, well-nourished, pleasant and cooperative in NAD Head:  Normocephalic and atraumatic. Eyes:  Sclera clear, no icterus.   Abdomen:  Soft, nontender and nondistended. Normal bowel sounds, without guarding, and without rebound.   Extremities:  Without clubbing, deformity or edema. Neurologic:  Alert to person, place.  grossly normal neurologically.  Psych:  Alert and cooperative. Normal mood and affect.  Intake/Output from previous day: 01/29 0701 - 01/30 0700 In: 720 [P.O.:720] Out: 400 [Urine:400] Intake/Output this shift: No intake/output data recorded.  Lab Results: CBC Recent Labs    04/22/23 1439 04/23/23 0441 04/24/23 0157  WBC 13.2* 12.1* 12.7*  HGB 10.6* 9.3* 9.5*  HCT 33.6* 28.9* 29.7*  MCV 82.2 82.8 83.0  PLT 231 204 229   BMET Recent Labs    04/22/23 1439 04/23/23 0441  NA 135 137  K 3.3* 3.4*  CL 103 108  CO2 21* 20*  GLUCOSE 125* 101*  BUN 22 23  CREATININE 0.95 0.96  CALCIUM 8.6* 8.2*   LFTs Recent Labs    04/22/23 1439 04/23/23 0441 04/23/23 1523  BILITOT 2.5* 2.4* 1.9*  BILIDIR  --   --  1.0*  IBILI  --   --  0.9  ALKPHOS 84 75 85  AST 183* 101* 112*  ALT 177* 139* 146*  PROT 7.2 6.1* 6.8  ALBUMIN 2.4* 2.1* 2.3*   No results for input(s): "LIPASE" in the last 72 hours. PT/INR Recent Labs     04/22/23 1948 04/23/23 1523  LABPROT 15.7* 15.2  INR 1.2 1.2         Imaging Studies: CT ABDOMEN PELVIS W CONTRAST Result Date: 04/22/2023 CLINICAL DATA:  Acute abdominal pain. Patient reports fever and body aches. EXAM: CT ABDOMEN AND PELVIS WITH CONTRAST TECHNIQUE: Multidetector CT imaging of the abdomen and pelvis was performed using the standard protocol following bolus administration of intravenous contrast. RADIATION DOSE REDUCTION: This exam was performed according to the departmental dose-optimization program which includes automated exposure control, adjustment of the mA and/or kV according to patient size and/or use of iterative reconstruction technique. CONTRAST:  OMNIPAQUE IOHEXOL 350 MG/ML SOLN COMPARISON:  CT 01/31/2021 FINDINGS: Lower chest: Assessed on concurrent chest CTA, reported separately. Hepatobiliary: Benign cyst in the subcapsular right lobe of the liver. No suspicious liver lesion. Unremarkable gallbladder. There is mild motion artifact which limits detailed assessment. Pancreas: Parenchymal atrophy. No ductal dilatation or inflammation. Spleen: Normal in size without focal abnormality. Adrenals/Urinary Tract: No adrenal nodule. No hydronephrosis or perinephric edema. Homogeneous renal enhancement with symmetric excretion on delayed phase imaging. There are multiple bilateral renal cysts. The largest cyst is in the right mid upper kidney measuring 10.5 cm. No further follow-up imaging is recommended. Urinary bladder is nondistended and not well assessed due to streak artifact from right hip arthroplasty. Stomach/Bowel: Midline ventral  abdominal wall hernia contains short segment of small bowel. There is no associated wall thickening or obstruction. Right hemicolectomy. Colonic diverticulosis without diverticulitis. Moderate volume of colonic stool. Previous right inguinal hernia has been repaired. Vascular/Lymphatic: Aortic atherosclerosis without aneurysm. Small volume  nonocclusive thrombus in the main portal vein series 2, images 22 and 23. No enlarged lymph nodes in the abdomen or pelvis. Reproductive: Fiducial markers or clips in the prostate. Other: Postsurgical change in the right inguinal canal, previous right inguinal hernia has been repaired. Midline ventral abdominal wall hernia contains short segment of small bowel without inflammation or obstruction. No ascites or free air. Musculoskeletal: Right hip arthroplasty. Chronic left hip arthropathy with flattening of the femoral head, near complete joint space loss, subchondral cystic change and sclerosis. This is similar in appearance to 11/27/2022 pelvis radiograph. There is a left hip joint effusion. Multilevel degenerative change in the lumbar spine. IMPRESSION: 1. 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 2. Midline ventral abdominal wall hernia contains short segment of small bowel without inflammation or obstruction. 3. Colonic diverticulosis without diverticulitis. 4. Chronic left hip arthropathy with flattening of the femoral head, near complete joint space loss, subchondral cystic change and sclerosis. There is a left hip joint effusion. 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 Clinical correlation is recommended in the setting of fever for hip infection, although chronic changes were seen on September 2024 radiograph and appear similar. Aortic Atherosclerosis (ICD10-I70.0). These results were called by telephone at the time of interpretation on 04/22/2023 at 6:57 pm to provider Gloris Manchester , who verbally acknowledged these results. Electronically Signed   By: Narda Rutherford M.D.   On: 04/22/2023 18:58   CT Angio Chest PE W and/or Wo Contrast Result Date: 04/22/2023 CLINICAL DATA:  Pulmonary embolism (PE) suspected, high prob Patient presents with fever and body aches. EXAM: CT ANGIOGRAPHY CHEST WITH CONTRAST TECHNIQUE: Multidetector CT imaging of the chest was performed using the  standard protocol during bolus administration of intravenous contrast. Multiplanar CT image reconstructions and MIPs were obtained to evaluate the vascular anatomy. Performed in conjunction with CT of the abdomen and pelvis. RADIATION DOSE REDUCTION: This exam was performed according to the departmental dose-optimization program which includes automated exposure control, adjustment of the mA and/or kV according to patient size and/or use of iterative reconstruction technique. CONTRAST:  OMNIPAQUE IOHEXOL 350 MG/ML SOLN COMPARISON:  Chest radiograph earlier today. FINDINGS: Cardiovascular: There are no filling defects within the pulmonary arteries to suggest pulmonary embolus. Dilated main pulmonary artery at 3.7 cm. The thoracic aorta is tortuous. No acute aortic finding. The heart is upper normal in size. No pericardial effusion. Mediastinum/Nodes: No mediastinal or hilar adenopathy. Unremarkable esophagus. No thyroid nodule. Lungs/Pleura: Mild lower lobe bronchial thickening. Bandlike right lower lobe opacities favor atelectasis. There is no confluent airspace disease. No pleural fluid. No pulmonary mass or suspicious nodule. No features of pulmonary edema. Trachea and central most airways are clear. Upper Abdomen: Assessed on concurrent abdominopelvic CT, reported separately. Musculoskeletal: Diffuse thoracic spondylosis with anterior spurring, bulky anterior osteophytes in the lower cervical spine. Mild broad-based dextroscoliotic curvature. The right shoulder is dislocated with the humeral head directed anteriorly and flattening of the glenoid. There are erosive changes involving the shoulder joint which are suboptimally assessed this is a change from 11/05/2021 shoulder CT. Review of the MIP images confirms the above findings. IMPRESSION: 1. No pulmonary embolus. 2. Mild lower lobe bronchial thickening, can be seen with bronchitis or reactive airways disease. 3.  Right shoulder dislocation with the  humeral head directed anteriorly and flattening of the glenoid. There are erosive changes involving the shoulder joint which are suboptimally assessed. This is a change from 11/05/2021 shoulder CT. Recommend correlation with history of shoulder pain. 4. Dilated main pulmonary artery, can be seen with pulmonary arterial hypertension. Electronically Signed   By: Narda Rutherford M.D.   On: 04/22/2023 18:47   US Abdomen Limited RUQ (LIVER/GB) Result Date: 04/22/2023 CLINICAL DATA:  Fever. EXAM: ULTRASOUND ABDOMEN LIMITED RIGHT UPPER QUADRANT COMPARISON:  CT of the abdomen pelvis dated 01/31/2021. FINDINGS: Gallbladder: No gallstones or wall thickening visualized. No sonographic Murphy sign noted by sonographer. Common bile duct: Diameter: 7 mm Liver: The liver demonstrates a normal echogenicity. There is a 1.8 x 1.3 x 1.7 cm cyst in the right lobe of the liver also present on the prior CT of 01/31/2021. Portal vein is patent on color Doppler imaging with normal direction of blood flow towards the liver. Other: Partially visualized large right renal cyst measuring up to 11 cm. IMPRESSION: 1. No gallstone. 2. Small right liver cyst. 3. Partially visualized right renal cyst. Electronically Signed   By: Elgie Collard M.D.   On: 04/22/2023 17:27   DG Chest Port 1 View Result Date: 04/22/2023 CLINICAL DATA:  Cough EXAM: PORTABLE CHEST 1 VIEW COMPARISON:  X-ray 04/12/2023 FINDINGS: Slight linear opacity lung bases likely scar or atelectasis. No consolidation, pneumothorax or effusion. No edema. Normal cardiopericardial silhouette. Tortuous ectatic aorta. Degenerative changes of the spine with curvature. Fixation hardware along the lower cervical spine. IMPRESSION: Minimal basilar atelectasis.  Tortuous ectatic aorta. Electronically Signed   By: Karen Kays M.D.   On: 04/22/2023 13:22   DG Chest Port 1 View Result Date: 04/12/2023 CLINICAL DATA:  Short of breath, lower extremity edema EXAM: PORTABLE CHEST 1 VIEW  COMPARISON:  11/29/2022 FINDINGS: Single frontal view of the chest demonstrates a stable cardiac silhouette. Continued ectasia of the thoracic aorta. No airspace disease, effusion, or pneumothorax. No acute bony abnormality. IMPRESSION: 1. No acute intrathoracic process. Electronically Signed   By: Sharlet Salina M.D.   On: 04/12/2023 18:43  [2 weeks]  Assessment:   KAYLEE WOMBLES is a 83 y.o. 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 Started on heparin for portal vein thrombosis, no evidence of cirrhosis.  No known malignancy.  There is concern for septic thrombophlebitis. He has has elevated inflammatory markers, fever, without obvious source of infection at this time. Appreciate hematology consult.   CTA chest negative for PE Seen by hematology, workup initiated Sed rate 80, CRP 27.6 Respiratory panel negative Blood cultures x 2 preliminary negative Acute hepatitis panel negative  Total bilirubin: 2.5--> 2.4--> 1.9 Alkaline phosphatase: 84--> 75--> 85 AST: 183--> 106--> 112 ALT: 177-> 139--> 146 LFTs normal April 12, 2023  Clinically without abdominal pain. Continues with fever. No LFTs drawn today.   Plan:   Trend LFTs, add for tomorrow.  Continue heparin with plans to transition to oral anticoagulation as outpatient.  Appreciate hematology evaluation.  GI will follow peripherally, please reach out with any additional questions/concerns.    LOS: 2 days   Leanna Battles. Dixon Boos [facility] [phone] 1/30/202511:06 AM

## 2023-04-24 NOTE — Consult Note (Signed)
PHARMACY - ANTICOAGULATION  Pharmacy Consult for Heparin >> apixaban Indication:  portal vein thrombosis   No Known Allergies  Patient Measurements: Heparin Dosing Weight: 83.6 kg  Vital Signs: Temp: 98.5 F (36.9 C) (01/30 0443) Temp Source: Oral (01/30 0443) BP: 125/75 (01/30 0443) Pulse Rate: 79 (01/30 0443)  Labs: Recent Labs    04/22/23 1439 04/22/23 1948 04/23/23 0441 04/23/23 1452 04/23/23 1523 04/24/23 0157  HGB 10.6*  --  9.3*  --   --  9.5*  HCT 33.6*  --  28.9*  --   --  29.7*  PLT 231  --  204  --   --  229  APTT  --  27  --   --   --   --   LABPROT  --  15.7*  --   --  15.2  --   INR  --  1.2  --   --  1.2  --   HEPARINUNFRC  --   --  <0.10* <0.10*  --  <0.10*  CREATININE 0.95  --  0.96  --   --   --    Estimated Creatinine Clearance: 63 mL/min (by C-G formula based on SCr of 0.96 mg/dL).  Assessment: 83 y.o. male with portal vein thrombosis for heparin. Now transitioning to therapeutic Eliquis.  Goal of Therapy:  Heparin level 0.3-0.7 units/ml Monitor platelets by anticoagulation protocol: Yes   Plan:  Stop heparin infusion. Start apixaban 10 mg twice daily x 7 days followed by apixaban 5 mg twice daily x 6 months per MD. Monitor H&H and s/s of bleeding.  Judeth Cornfield, PharmD Clinical Pharmacist 04/24/2023 10:17 AM

## 2023-04-24 NOTE — Consult Note (Signed)
[facility] Consultation Oncology  Name: Randy Russo      MRN: [id number]    Location: W413/K440-10  Date: 04/24/2023 Time:8:27 AM   REFERRING PHYSICIAN: Dr. Flossie Dibble  REASON FOR CONSULT: Recent portal vein thrombosis    HISTORY OF PRESENT ILLNESS: Randy Russo is a 83 year old male seen in consultation today at the request of Dr. Flossie Dibble.  He has medical history significant for generalized deconditioning, GI bleed, COPD, HTN, recurrent falls and osteoarthritis, resident at [facility] for evaluation of fever and generalized bodyaches.  He had a fever of 101.2 at presentation.  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  No liver pathology was seen.  CT angiogram of the chest was negative for pulmonary embolism.  He has been a resident at [facility] since this fall 1 and half year ago.  He uses walker to ambulate and denies any recent falls.  Denies any bleeding per rectum or melena.  No fevers, night sweats or weight loss reported.  No prior history of thrombosis.  Denies any family history or personal history of malignancy.  PAST MEDICAL HISTORY:   Past Medical History:  Diagnosis Date   Arthritis    Cancer [facility])    Prostate   COPD (chronic obstructive pulmonary disease) (HCC)    Diverticulitis    Hypertension    Lower GI bleed 12/27/2017    ALLERGIES: No Known Allergies    MEDICATIONS: I have reviewed the patient's current medications.     PAST SURGICAL HISTORY Past Surgical History:  Procedure Laterality Date   CATARACT EXTRACTION W/PHACO Left 11/16/2012   Procedure: CATARACT EXTRACTION PHACO AND INTRAOCULAR LENS PLACEMENT (IOC);  Surgeon: Gemma Payor, MD;  Location: [facility] ORS;  Service: Ophthalmology;  Laterality: Left;  CDE: 13.66   CATARACT EXTRACTION W/PHACO Right 01/18/2013   Procedure: CATARACT EXTRACTION PHACO AND INTRAOCULAR LENS PLACEMENT (IOC);  Surgeon: Gemma Payor, MD;  Location: [facility] ORS;  Service: Ophthalmology;  Laterality:  Right;  CDE:8.79   CERVICAL FUSION  4 yrs ago   COLONOSCOPY N/A 12/28/2017   Procedure: COLONOSCOPY;  Surgeon: Malissa Hippo, MD;  Location: [facility] ENDO SUITE;  Service: Endoscopy;  Laterality: N/A;   COLONOSCOPY N/A 01/01/2018   Procedure: COLONOSCOPY;  Surgeon: Malissa Hippo, MD;  Location: [facility] ENDO SUITE;  Service: Endoscopy;  Laterality: N/A;   ESOPHAGOGASTRODUODENOSCOPY N/A 01/01/2018   Procedure: ESOPHAGOGASTRODUODENOSCOPY (EGD);  Surgeon: Malissa Hippo, MD;  Location: [facility] ENDO SUITE;  Service: Endoscopy;  Laterality: N/A;   INGUINAL HERNIA REPAIR Right 03/12/2021   Procedure: HERNIA REPAIR INGUINAL ADULT;  Surgeon: Franky Macho, MD;  Location: [facility] ORS;  Service: General;  Laterality: Right;   JOINT REPLACEMENT Left 5 yrs ago   knee   PARTIAL COLECTOMY N/A 01/01/2018   Procedure: PARTIAL COLECTOMY;  Surgeon: Franky Macho, MD;  Location: [facility] ORS;  Service: General;  Laterality: N/A;   right hip replacement Right 3 yrs ago    FAMILY HISTORY: Family History  Problem Relation Age of Onset   Diabetes Mother    Dementia Father     SOCIAL HISTORY:  reports that he has quit smoking. His smoking use included cigars. He has never used smokeless tobacco. He reports that he does not drink alcohol and does not use drugs.  PERFORMANCE STATUS: The patient's performance status is 1 - Symptomatic but completely ambulatory  PHYSICAL EXAM: Most Recent Vital Signs: Blood pressure 125/75, pulse 79, temperature 98.5 F (36.9 C), temperature source Oral, resp. rate 16,  height 5\' 6"  (1.676 m), weight 203 lb 0.7 oz (92.1 kg), SpO2 100%. BP 125/75 (BP Location: Right Arm)   Pulse 79   Temp 98.5 F (36.9 C) (Oral)   Resp 16   Ht 5\' 6"  (1.676 m)   Wt 203 lb 0.7 oz (92.1 kg)   SpO2 100%   BMI 32.77 kg/m  General appearance: alert, cooperative, and appears stated age Lungs: clear to auscultation bilaterally Abdomen: soft, non-tender; bowel sounds normal; no masses,  no  organomegaly Extremities:  No edema cyanosis.  LABORATORY DATA:  Results for orders placed or performed during the hospital encounter of 04/22/23 (from the past 48 hours)  Resp panel by RT-PCR (RSV, Flu A&B, Covid) Anterior Nasal Swab     Status: None   Collection Time: 04/22/23 12:44 PM   Specimen: Anterior Nasal Swab  Result Value Ref Range   SARS Coronavirus 2 by RT PCR NEGATIVE NEGATIVE    Comment: (NOTE) SARS-CoV-2 target nucleic acids are NOT DETECTED.  The SARS-CoV-2 RNA is generally detectable in upper respiratory specimens during the acute phase of infection. The lowest concentration of SARS-CoV-2 viral copies this assay can detect is 138 copies/mL. A negative result does not preclude SARS-Cov-2 infection and should not be used as the sole basis for treatment or other patient management decisions. A negative result may occur with  improper specimen collection/handling, submission of specimen other than nasopharyngeal swab, presence of viral mutation(s) within the areas targeted by this assay, and inadequate number of viral copies(<138 copies/mL). A negative result must be combined with clinical observations, patient history, and epidemiological information. The expected result is Negative.  Fact Sheet for Patients:  BloggerCourse.com  Fact Sheet for Healthcare Providers:  SeriousBroker.it  This test is no t yet approved or cleared by the [place] FDA and  has been authorized for detection and/or diagnosis of SARS-CoV-2 by FDA under an Emergency Use Authorization (EUA). This EUA will remain  in effect (meaning this test can be used) for the duration of the COVID-19 declaration under Section 564(b)(1) of the Act, 21 U.S.C.section 360bbb-3(b)(1), unless the authorization is terminated  or revoked sooner.       Influenza A by PCR NEGATIVE NEGATIVE   Influenza B by PCR NEGATIVE NEGATIVE    Comment: (NOTE) The Xpert  Xpress SARS-CoV-2/FLU/RSV plus assay is intended as an aid in the diagnosis of influenza from Nasopharyngeal swab specimens and should not be used as a sole basis for treatment. Nasal washings and aspirates are unacceptable for Xpert Xpress SARS-CoV-2/FLU/RSV testing.  Fact Sheet for Patients: BloggerCourse.com  Fact Sheet for Healthcare Providers: SeriousBroker.it  This test is not yet approved or cleared by the [place] FDA and has been authorized for detection and/or diagnosis of SARS-CoV-2 by FDA under an Emergency Use Authorization (EUA). This EUA will remain in effect (meaning this test can be used) for the duration of the COVID-19 declaration under Section 564(b)(1) of the Act, 21 U.S.C. section 360bbb-3(b)(1), unless the authorization is terminated or revoked.     Resp Syncytial Virus by PCR NEGATIVE NEGATIVE    Comment: (NOTE) Fact Sheet for Patients: BloggerCourse.com  Fact Sheet for Healthcare Providers: SeriousBroker.it  This test is not yet approved or cleared by the [place] FDA and has been authorized for detection and/or diagnosis of SARS-CoV-2 by FDA under an Emergency Use Authorization (EUA). This EUA will remain in effect (meaning this test can be used) for the duration of the COVID-19 declaration under Section 564(b)(1) of the  Act, 21 U.S.C. section 360bbb-3(b)(1), unless the authorization is terminated or revoked.  Performed at [facility], [address], [place], [place] [address]   Lactic acid, plasma     Status: None   Collection Time: 04/22/23  2:23 PM  Result Value Ref Range   Lactic Acid, Venous 1.8 0.5 - 1.9 mmol/L    Comment: Performed at [facility], [address], [place], [place] [address]  Blood culture (routine x 2)     Status: None (Preliminary result)   Collection Time: 04/22/23  2:23 PM   Specimen: BLOOD  Result Value Ref  Range   Specimen Description BLOOD LEFT ANTECUBITAL    Special Requests      BOTTLES DRAWN AEROBIC AND ANAEROBIC Blood Culture results may not be optimal due to an inadequate volume of blood received in culture bottles   Culture      NO GROWTH 2 DAYS Performed at [facility], [address] [place], [place] [address]    Report Status PENDING   Sedimentation rate     Status: Abnormal   Collection Time: 04/22/23  2:38 PM  Result Value Ref Range   Sed Rate 80 (H) 0 - 16 mm/hr    Comment: Performed at [facility], [address], [place], [place] [address]  CBC     Status: Abnormal   Collection Time: 04/22/23  2:39 PM  Result Value Ref Range   WBC 13.2 (H) 4.0 - 10.5 K/uL   RBC 4.09 (L) 4.22 - 5.81 MIL/uL   Hemoglobin 10.6 (L) 13.0 - 17.0 g/dL   HCT 78.4 (L) 69.6 - 29.5 %   MCV 82.2 80.0 - 100.0 fL   MCH 25.9 (L) 26.0 - 34.0 pg   MCHC 31.5 30.0 - 36.0 g/dL   RDW 28.4 (H) 13.2 - 44.0 %   Platelets 231 150 - 400 K/uL   nRBC 0.0 0.0 - 0.2 %    Comment: Performed at [facility], [address], [place], [place] [address]  Comprehensive metabolic panel     Status: Abnormal   Collection Time: 04/22/23  2:39 PM  Result Value Ref Range   Sodium 135 135 - 145 mmol/L   Potassium 3.3 (L) 3.5 - 5.1 mmol/L   Chloride 103 98 - 111 mmol/L   CO2 21 (L) 22 - 32 mmol/L   Glucose, Bld 125 (H) 70 - 99 mg/dL    Comment: Glucose reference range applies only to samples taken after fasting for at least 8 hours.   BUN 22 8 - 23 mg/dL   Creatinine, Ser 5.36 0.61 - 1.24 mg/dL   Calcium 8.6 (L) 8.9 - 10.3 mg/dL   Total Protein 7.2 6.5 - 8.1 g/dL   Albumin 2.4 (L) 3.5 - 5.0 g/dL   AST 644 (H) 15 - 41 U/L   ALT 177 (H) 0 - 44 U/L   Alkaline Phosphatase 84 38 - 126 U/L   Total Bilirubin 2.5 (H) 0.0 - 1.2 mg/dL   GFR, Estimated >03 >47 mL/min    Comment: (NOTE) Calculated using the CKD-EPI Creatinine Equation (2021)    Anion gap 11 5 - 15    Comment: Performed at [facility], [address],  [place], [place] [address]  Blood culture (routine x 2)     Status: None (Preliminary result)   Collection Time: 04/22/23  2:39 PM   Specimen: BLOOD  Result Value Ref Range   Specimen Description BLOOD BLOOD RIGHT FOREARM    Special Requests  BOTTLES DRAWN AEROBIC AND ANAEROBIC Blood Culture adequate volume   Culture      NO GROWTH 2 DAYS Performed at [facility], [address], [place], [place] [address]    Report Status PENDING   Acetaminophen level     Status: Abnormal   Collection Time: 04/22/23  2:39 PM  Result Value Ref Range   Acetaminophen (Tylenol), Serum 39 (H) 10 - 30 ug/mL    Comment: (NOTE) Therapeutic concentrations vary significantly. A range of 10-30 ug/mL  may be an effective concentration for many patients. However, some  are best treated at concentrations outside of this range. Acetaminophen concentrations >150 ug/mL at 4 hours after ingestion  and >50 ug/mL at 12 hours after ingestion are often associated with  toxic reactions.  Performed at [facility], [address], [place], [place] [address]   Lactic acid, plasma     Status: None   Collection Time: 04/22/23  4:54 PM  Result Value Ref Range   Lactic Acid, Venous 1.0 0.5 - 1.9 mmol/L    Comment: Performed at [facility], [address] [place], [place] [address]  Urinalysis, Routine w reflex microscopic -Urine, Clean Catch     Status: Abnormal   Collection Time: 04/22/23  5:24 PM  Result Value Ref Range   Color, Urine AMBER (A) YELLOW    Comment: BIOCHEMICALS MAY BE AFFECTED BY COLOR   APPearance HAZY (A) CLEAR   Specific Gravity, Urine 1.020 1.005 - 1.030   pH 5.0 5.0 - 8.0   Glucose, UA NEGATIVE NEGATIVE mg/dL   Hgb urine dipstick SMALL (A) NEGATIVE   Bilirubin Urine NEGATIVE NEGATIVE   Ketones, ur NEGATIVE NEGATIVE mg/dL   Protein, ur 30 (A) NEGATIVE mg/dL   Nitrite NEGATIVE NEGATIVE   Leukocytes,Ua NEGATIVE NEGATIVE   RBC / HPF 6-10 0 - 5 RBC/hpf   WBC, UA 0-5 0 - 5 WBC/hpf   Bacteria, UA  RARE (A) NONE SEEN   Squamous Epithelial / HPF 0-5 0 - 5 /HPF   Mucus PRESENT    Hyaline Casts, UA PRESENT     Comment: Performed at [facility], [address] [place], [place] [address]  Hepatitis panel, acute     Status: None   Collection Time: 04/22/23  6:35 PM  Result Value Ref Range   Hepatitis B Surface Ag NON REACTIVE NON REACTIVE   HCV Ab NON REACTIVE NON REACTIVE    Comment: (NOTE) Nonreactive HCV antibody screen is consistent with no HCV infections,  unless recent infection is suspected or other evidence exists to indicate HCV infection.     Hep A IgM NON REACTIVE NON REACTIVE   Hep B C IgM NON REACTIVE NON REACTIVE    Comment: Performed at [facility], [address], [place], [place] [address]  C-reactive protein     Status: Abnormal   Collection Time: 04/22/23  6:35 PM  Result Value Ref Range   CRP 27.6 (H) <1.0 mg/dL    Comment: Performed at [facility], [address], [place], [place] [address]  Protime-INR     Status: Abnormal   Collection Time: 04/22/23  7:48 PM  Result Value Ref Range   Prothrombin Time 15.7 (H) 11.4 - 15.2 seconds   INR 1.2 0.8 - 1.2    Comment: (NOTE) INR goal varies based on device and disease states. Performed at [facility], [address] [place], [place] [address]   APTT     Status: None  Collection Time: 04/22/23  7:48 PM  Result Value Ref Range   aPTT 27 24 - 36 seconds    Comment: Performed at [facility], [address], [place], [place] [address]  CBC     Status: Abnormal   Collection Time: 04/23/23  4:41 AM  Result Value Ref Range   WBC 12.1 (H) 4.0 - 10.5 K/uL   RBC 3.49 (L) 4.22 - 5.81 MIL/uL   Hemoglobin 9.3 (L) 13.0 - 17.0 g/dL   HCT 62.1 (L) 30.8 - 65.7 %   MCV 82.8 80.0 - 100.0 fL   MCH 26.6 26.0 - 34.0 pg   MCHC 32.2 30.0 - 36.0 g/dL   RDW 84.6 (H) 96.2 - 95.2 %   Platelets 204 150 - 400 K/uL   nRBC 0.0 0.0 - 0.2 %    Comment: Performed at [facility], [address], [place], [place]  [address]  Heparin level (unfractionated)     Status: Abnormal   Collection Time: 04/23/23  4:41 AM  Result Value Ref Range   Heparin Unfractionated <0.10 (L) 0.30 - 0.70 IU/mL    Comment: (NOTE) The clinical reportable range upper limit is being lowered to >1.10 to align with the FDA approved guidance for the current laboratory assay.  If heparin results are below expected values, and patient dosage has  been confirmed, suggest follow up testing of antithrombin III levels. Performed at [facility], [address] [place], [place] [address]   Comprehensive metabolic panel     Status: Abnormal   Collection Time: 04/23/23  4:41 AM  Result Value Ref Range   Sodium 137 135 - 145 mmol/L   Potassium 3.4 (L) 3.5 - 5.1 mmol/L   Chloride 108 98 - 111 mmol/L   CO2 20 (L) 22 - 32 mmol/L   Glucose, Bld 101 (H) 70 - 99 mg/dL    Comment: Glucose reference range applies only to samples taken after fasting for at least 8 hours.   BUN 23 8 - 23 mg/dL   Creatinine, Ser 2.72 0.61 - 1.24 mg/dL   Calcium 8.2 (L) 8.9 - 10.3 mg/dL   Total Protein 6.1 (L) 6.5 - 8.1 g/dL   Albumin 2.1 (L) 3.5 - 5.0 g/dL   AST 536 (H) 15 - 41 U/L   ALT 139 (H) 0 - 44 U/L   Alkaline Phosphatase 75 38 - 126 U/L   Total Bilirubin 2.4 (H) 0.0 - 1.2 mg/dL   GFR, Estimated >64 >40 mL/min    Comment: (NOTE) Calculated using the CKD-EPI Creatinine Equation (2021)    Anion gap 9 5 - 15    Comment: Performed at [facility], [address], [place], [place] [address]  Heparin level (unfractionated)     Status: Abnormal   Collection Time: 04/23/23  2:52 PM  Result Value Ref Range   Heparin Unfractionated <0.10 (L) 0.30 - 0.70 IU/mL    Comment: (NOTE) The clinical reportable range upper limit is being lowered to >1.10 to align with the FDA approved guidance for the current laboratory assay.  If heparin results are below expected values, and patient dosage has  been confirmed, suggest follow up testing of antithrombin III  levels. Performed at [facility], [address] [place], [place] [address]   Hepatic function panel     Status: Abnormal   Collection Time: 04/23/23  3:23 PM  Result Value Ref Range   Total Protein 6.8 6.5 - 8.1 g/dL   Albumin 2.3 (L) 3.5 - 5.0  g/dL   AST 161 (H) 15 - 41 U/L   ALT 146 (H) 0 - 44 U/L   Alkaline Phosphatase 85 38 - 126 U/L   Total Bilirubin 1.9 (H) 0.0 - 1.2 mg/dL   Bilirubin, Direct 1.0 (H) 0.0 - 0.2 mg/dL   Indirect Bilirubin 0.9 0.3 - 0.9 mg/dL    Comment: Performed at [facility], [address] [place], [place] [address]  Protime-INR     Status: None   Collection Time: 04/23/23  3:23 PM  Result Value Ref Range   Prothrombin Time 15.2 11.4 - 15.2 seconds   INR 1.2 0.8 - 1.2    Comment: (NOTE) INR goal varies based on device and disease states. Performed at [facility], [address] [place], [place] [address]   Heparin level (unfractionated)     Status: Abnormal   Collection Time: 04/24/23  1:57 AM  Result Value Ref Range   Heparin Unfractionated <0.10 (L) 0.30 - 0.70 IU/mL    Comment: (NOTE) The clinical reportable range upper limit is being lowered to >1.10 to align with the FDA approved guidance for the current laboratory assay.  If heparin results are below expected values, and patient dosage has  been confirmed, suggest follow up testing of antithrombin III levels. Performed at [facility], [address], [place], [place] [address]   CBC     Status: Abnormal   Collection Time: 04/24/23  1:57 AM  Result Value Ref Range   WBC 12.7 (H) 4.0 - 10.5 K/uL   RBC 3.58 (L) 4.22 - 5.81 MIL/uL   Hemoglobin 9.5 (L) 13.0 - 17.0 g/dL   HCT 78.2 (L) 95.6 - 21.3 %   MCV 83.0 80.0 - 100.0 fL   MCH 26.5 26.0 - 34.0 pg   MCHC 32.0 30.0 - 36.0 g/dL   RDW 08.6 (H) 57.8 - 46.9 %   Platelets 229 150 - 400 K/uL   nRBC 0.0 0.0 - 0.2 %    Comment: Performed at [facility], [address], [place], [place] [address]      RADIOGRAPHY: CT ABDOMEN PELVIS W  CONTRAST Result Date: 04/22/2023 CLINICAL DATA:  Acute abdominal pain. Patient reports fever and body aches. EXAM: CT ABDOMEN AND PELVIS WITH CONTRAST TECHNIQUE: Multidetector CT imaging of the abdomen and pelvis was performed using the standard protocol following bolus administration of intravenous contrast. RADIATION DOSE REDUCTION: This exam was performed according to the departmental dose-optimization program which includes automated exposure control, adjustment of the mA and/or kV according to patient size and/or use of iterative reconstruction technique. CONTRAST:  OMNIPAQUE IOHEXOL 350 MG/ML SOLN COMPARISON:  CT 01/31/2021 FINDINGS: Lower chest: Assessed on concurrent chest CTA, reported separately. Hepatobiliary: Benign cyst in the subcapsular right lobe of the liver. No suspicious liver lesion. Unremarkable gallbladder. There is mild motion artifact which limits detailed assessment. Pancreas: Parenchymal atrophy. No ductal dilatation or inflammation. Spleen: Normal in size without focal abnormality. Adrenals/Urinary Tract: No adrenal nodule. No hydronephrosis or perinephric edema. Homogeneous renal enhancement with symmetric excretion on delayed phase imaging. There are multiple bilateral renal cysts. The largest cyst is in the right mid upper kidney measuring 10.5 cm. No further follow-up imaging is recommended. Urinary bladder is nondistended and not well assessed due to streak artifact from right hip arthroplasty. Stomach/Bowel: Midline ventral abdominal wall hernia contains short segment of small bowel. There is no associated wall thickening or obstruction. Right hemicolectomy. Colonic diverticulosis without diverticulitis. Moderate volume of colonic stool. Previous right inguinal hernia  has been repaired. Vascular/Lymphatic: Aortic atherosclerosis without aneurysm. 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 No enlarged lymph nodes in the abdomen or  pelvis. Reproductive: Fiducial markers or clips in the prostate. Other: Postsurgical change in the right inguinal canal, previous right inguinal hernia has been repaired. Midline ventral abdominal wall hernia contains short segment of small bowel without inflammation or obstruction. No ascites or free air. Musculoskeletal: Right hip arthroplasty. Chronic left hip arthropathy with flattening of the femoral head, near complete joint space loss, subchondral cystic change and sclerosis. This is similar in appearance to 11/27/2022 pelvis radiograph. There is a left hip joint effusion. Multilevel degenerative change in the lumbar spine. IMPRESSION: 1. 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 2. Midline ventral abdominal wall hernia contains short segment of small bowel without inflammation or obstruction. 3. Colonic diverticulosis without diverticulitis. 4. Chronic left hip arthropathy with flattening of the femoral head, near complete joint space loss, subchondral cystic change and sclerosis. There is a left hip joint effusion. 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 Clinical correlation is recommended in the setting of fever for hip infection, although chronic changes were seen on September 2024 radiograph and appear similar. Aortic Atherosclerosis (ICD10-I70.0). These results were called by telephone at the time of interpretation on 04/22/2023 at 6:57 pm to provider Gloris Manchester , who verbally acknowledged these results. Electronically Signed   By: Narda Rutherford M.D.   On: 04/22/2023 18:58   CT Angio Chest PE W and/or Wo Contrast Result Date: 04/22/2023 CLINICAL DATA:  Pulmonary embolism (PE) suspected, high prob Patient presents with fever and body aches. EXAM: CT ANGIOGRAPHY CHEST WITH CONTRAST TECHNIQUE: Multidetector CT imaging of the chest was performed using the standard protocol during bolus administration of intravenous contrast. Multiplanar CT image reconstructions and MIPs  were obtained to evaluate the vascular anatomy. Performed in conjunction with CT of the abdomen and pelvis. RADIATION DOSE REDUCTION: This exam was performed according to the departmental dose-optimization program which includes automated exposure control, adjustment of the mA and/or kV according to patient size and/or use of iterative reconstruction technique. CONTRAST:  OMNIPAQUE IOHEXOL 350 MG/ML SOLN COMPARISON:  Chest radiograph earlier today. FINDINGS: Cardiovascular: There are no filling defects within the pulmonary arteries to suggest pulmonary embolus. Dilated main pulmonary artery at 3.7 cm. The thoracic aorta is tortuous. No acute aortic finding. The heart is upper normal in size. No pericardial effusion. Mediastinum/Nodes: No mediastinal or hilar adenopathy. Unremarkable esophagus. No thyroid nodule. Lungs/Pleura: Mild lower lobe bronchial thickening. Bandlike right lower lobe opacities favor atelectasis. There is no confluent airspace disease. No pleural fluid. No pulmonary mass or suspicious nodule. No features of pulmonary edema. Trachea and central most airways are clear. Upper Abdomen: Assessed on concurrent abdominopelvic CT, reported separately. Musculoskeletal: Diffuse thoracic spondylosis with anterior spurring, bulky anterior osteophytes in the lower cervical spine. Mild broad-based dextroscoliotic curvature. The right shoulder is dislocated with the humeral head directed anteriorly and flattening of the glenoid. There are erosive changes involving the shoulder joint which are suboptimally assessed this is a change from 11/05/2021 shoulder CT. Review of the MIP images confirms the above findings. IMPRESSION: 1. No pulmonary embolus. 2. Mild lower lobe bronchial thickening, can be seen with bronchitis or reactive airways disease. 3. Right shoulder dislocation with the humeral head directed anteriorly and flattening of the glenoid. There are erosive changes involving the shoulder joint  which are suboptimally assessed. This is a change from  11/05/2021 shoulder CT. Recommend correlation with history of shoulder pain. 4. Dilated main pulmonary artery, can be seen with pulmonary arterial hypertension. Electronically Signed   By: Narda Rutherford M.D.   On: 04/22/2023 18:47   US Abdomen Limited RUQ (LIVER/GB) Result Date: 04/22/2023 CLINICAL DATA:  Fever. EXAM: ULTRASOUND ABDOMEN LIMITED RIGHT UPPER QUADRANT COMPARISON:  CT of the abdomen pelvis dated 01/31/2021. FINDINGS: Gallbladder: No gallstones or wall thickening visualized. No sonographic Murphy sign noted by sonographer. Common bile duct: Diameter: 7 mm Liver: The liver demonstrates a normal echogenicity. There is a 1.8 x 1.3 x 1.7 cm cyst in the right lobe of the liver also present on the prior CT of 01/31/2021. Portal vein is patent on color Doppler imaging with normal direction of blood flow towards the liver. Other: Partially visualized large right renal cyst measuring up to 11 cm. IMPRESSION: 1. No gallstone. 2. Small right liver cyst. 3. Partially visualized right renal cyst. Electronically Signed   By: Elgie Collard M.D.   On: 04/22/2023 17:27   DG Chest Port 1 View Result Date: 04/22/2023 CLINICAL DATA:  Cough EXAM: PORTABLE CHEST 1 VIEW COMPARISON:  X-ray 04/12/2023 FINDINGS: Slight linear opacity lung bases likely scar or atelectasis. No consolidation, pneumothorax or effusion. No edema. Normal cardiopericardial silhouette. Tortuous ectatic aorta. Degenerative changes of the spine with curvature. Fixation hardware along the lower cervical spine. IMPRESSION: Minimal basilar atelectasis.  Tortuous ectatic aorta. Electronically Signed   By: Karen Kays M.D.   On: 04/22/2023 13:22         ASSESSMENT and PLAN:  1.  Recent portal vein thrombosis: - Presentation with fever and bodyaches. - CT AP showed small volume nonocclusive thrombus in the main portal vein.  No suspicious liver lesions. - CT angiogram of the chest was  negative for PE. - He denies any abdominal pain.  However LFTs are elevated since admission.  Denies any prior history of thrombosis. - Will check for lupus anticoagulant, antibeta 2 glycoprotein 1 antibodies, anticardiolipin antibodies and factor V Leiden. - Recommend at least 6 months of anticoagulation.  May switch to DOAC on discharge. - Will follow-up with him in the clinic 1 to 2 months after discharge.  2.  Normocytic anemia: - Long history of normocytic anemia. - Recommend anemia workup with ferritin, iron panel, B12, folic acid, MMA and copper levels.   All questions were answered. The patient knows to call the clinic with any problems, questions or concerns. We can certainly see the patient much sooner if necessary. Doreatha Massed

## 2023-04-24 NOTE — Care Management Important Message (Signed)
Important Message  Patient Details  Name: Randy Russo MRN: [id number] Date of Birth: Sep 22, 1940   Important Message Given:  N/A - LOS <3 / Initial given by admissions     Corey Harold 04/24/2023, 10:59 AM

## 2023-04-24 NOTE — Progress Notes (Signed)
Mobility Specialist Progress Note:    04/24/23 1355  Mobility  Activity Transferred from chair to bed  Level of Assistance Maximum assist, patient does 25-49%  Assistive Device Front wheel walker  Distance Ambulated (ft) 3 ft  Range of Motion/Exercises Active;All extremities  Activity Response Tolerated well  Mobility Referral Yes  Mobility visit 1 Mobility  Mobility Specialist Start Time (ACUTE ONLY) 1355  Mobility Specialist Stop Time (ACUTE ONLY) 1405  Mobility Specialist Time Calculation (min) (ACUTE ONLY) 10 min   Pt received in chair, NT at bedside. Required MaxA to stand and transfer with RW. Tolerated well, asx throughout. Left pt supine, alarm on. NT in room, all needs met.   Lawerance Bach Mobility Specialist Please contact via Special educational needs teacher or  Rehab office at [phone]

## 2023-04-24 NOTE — Progress Notes (Signed)
Palliative:  Randy Russo is sitting up in the [place] chair in his room.  HE greets me making and mostly keeping eye contact.  He is alert and oriented, able to make his needs known.  There is no family at bedside at this time.   We talked about his acute health concerns.  Randy Russo is able to tell me that he has a blood clot, he saw his trusted oncologist, Dr. Ellin Saba, yesterday and was able to tell me that he will be on blood thinners.  He is agreeable to return to short-term rehab at [facility].  All questions and concerns addressed.  Conference with attending, bedside nursing staff, registered dietitian, transition of care team related to patient condition, needs, goals of care, disposition.  Plan: At this point continue to treat the treatable but no CPR or intubation.  Time for outcomes.  Return to short-term rehab at [facility].      35 minutes Lillia Carmel, NP Palliative medicine team Team phone [phone]

## 2023-04-24 NOTE — Progress Notes (Signed)
Fraction Heparin labs: <0.10 Pharmacy placed ordered for Bolus of 2,000 units. New rate 22.5 ml/hr due to Heparen level still being undetectable on 1950 units/hr . No bleeding or IV issues were noticed, blood draw was performed on opposite are of active Heparin run. Will continue to monitor pt for changes.

## 2023-04-24 NOTE — TOC Progression Note (Signed)
Transition of Care [facility]) - Progression Note    Patient Details  Name: Randy Russo MRN: [id number] Date of Birth: 1940-10-25  [facility]) CM/SW Contact  Villa Herb, [place] Phone Number: 04/24/2023, 10:51 AM  Clinical Narrative:    PT worked with pt and recommends SNF for pt at D/C. CSW spoke with Eunice Blase at [facility] who confirms pt will need a new auth. CSW to start insurance auth at this time. TOC to follow.   Expected Discharge Plan: Skilled Nursing Facility Barriers to Discharge: Continued Medical Work up  Expected Discharge Plan and Services In-house Referral: Clinical Social Work Discharge Planning Services: CM Consult Post Acute Care Choice: Skilled Nursing Facility Living arrangements for the past 2 months: Single Family Home                                       Social Determinants of Health (SDOH) Interventions SDOH Screenings   Food Insecurity: No Food Insecurity (04/23/2023)  Recent Concern: Food Insecurity - Food Insecurity Present (04/22/2023)  Housing: Low Risk  (04/23/2023)  Recent Concern: Housing - High Risk (04/13/2023)  Transportation Needs: No Transportation Needs (04/23/2023)  Utilities: Patient Unable To Answer (04/23/2023)  Financial Resource Strain: Low Risk  (11/30/2022)   Received from [facility]  Physical Activity: Unknown (12/27/2017)  Social Connections: Unknown (04/23/2023)  Tobacco Use: Medium Risk (04/23/2023)  Health Literacy: Medium Risk (11/30/2022)   Received from [facility]    Readmission Risk Interventions     No data to display

## 2023-04-24 NOTE — Telephone Encounter (Signed)
Patient Product/process development scientist completed.    The patient is insured through [place]. Patient has Medicare and is not eligible for a copay card, but may be able to apply for patient assistance or Medicare RX Payment Plan (Patient Must reach out to their plan, if eligible for payment plan), if available.    Ran test claim for Eliquis 5 mg and the current 30 day co-pay is $297.00 due to a $250.00 deductible.  Will be $47.00 once deductible is met.   This test claim was processed through [facility]- copay amounts may vary at other pharmacies due to pharmacy/plan contracts, or as the patient moves through the different stages of their insurance plan.     Roland Earl, CPHT Pharmacy Technician III Certified Patient Advocate [facility] Patient Advocate Team Direct Number: [phone]  Fax: [phone]

## 2023-04-24 NOTE — Progress Notes (Signed)
PROGRESS NOTE    Patient: Randy Russo                            PCP: Assunta Found, MD                    DOB: 1940/11/29            DOA: 04/22/2023 AOZ:[id number]             DOS: 04/24/2023, 12:23 PM   LOS: 2 days   Date of Service: The patient was seen and examined on 04/24/2023  Subjective:   The patient was seen and examined this morning, stable no acute distress, afebrile and normotensive Tmax 100.4 this morning 98.5 Improved leukocytosis Denies any chest pain or shortness of breath Has been on heparin drip  Brief Narrative:   Randy Russo is a 83 y.o. male with medical history significant for generalized deconditioning, GI bleed, COPD, HTN, recurrent falls and osteoarthritis who presented to the ED via EMS from SNF, [facility], for evaluation of fever and generalized bodyaches.   On evaluation, patient reports feeling weak, generalized muscle aches and stiff.  He reports a chronic nonproductive cough from his COPD but denies any shortness of breath, abdominal pain, chest pain, headache, dizziness, dysuria, melena, nausea, vomiting, hematuria or back pain.   ED Course: Initial vitals showed temp 101.2, RR 18, HR 95, BP 108/62 and SpO2 96% on room air. Labs significant for WBC 13.2  AST/ALT 183/177, bilirubin 2.5, lactic acid 1.8-1.0    CT A/P shows small volume nonocclusive thrombus in the main portal vein CTA chest PE study negative for PE.  CTA chest PE study negative for PE.       Assessment & Plan:   Principal Problem:   Portal vein thrombosis Active Problems:   Acute febrile illness   Elevated liver function tests    Portal vein thrombosis with Fever - Fever, leukocytosis and generalized weakness and muscle aches found to have thrombosis of his portal vein.   No evidence of respiratory, skin or urinary infection.   -LFTs elevated but no radiological abnormalities in the liver or biliary tract. Septic thrombophlebitis is high on differential in the  setting of systemic symptoms, elevated WBC, ESR and evidence of portal vein thrombosis in this elderly gentleman. -GI consulted, appreciate recs  -Discussed with hematologist Dr. Ellin Saba who will check lupus anticoagulant, antibeta 2 glycoprotein 1 antibodies, anticardiolipin antibodies and factor V Leiden. - Recommend at least 6 months of anticoagulation.  May switch to DOAC Follow-up in 1 to 2 months   -- Will continue current IV antibiotic of Zosyn -Discontinuing heparin drip, switching to Eliquis -All blood cultures    Generalized weakness/recent falls, osteoarthritis, -Chronic history of generalized deconditioning Worsening in the setting of infection -PT/OT eval and treat -Continue vitamin supplementation   Transaminitis    Latest Ref Rng & Units 04/23/2023    4:41 AM 04/22/2023    2:39 PM 04/12/2023    6:22 PM  Hepatic Function  Total Protein 6.5 - 8.1 g/dL 6.1  7.2  7.1   Albumin 3.5 - 5.0 g/dL 2.1  2.4  3.2   AST 15 - 41 U/L 101  183  15   ALT 0 - 44 U/L 139  177  16   Alk Phosphatase 38 - 126 U/L 75  84  60   Total Bilirubin 0.0 - 1.2 mg/dL 2.4  2.5  0.4    -In the setting of portal vein thrombosis, ?  Infection CT does not show any abnormalities of the liver or biliary system. Acetaminophen level slightly elevated to 39 however his elevated LFTs are likely due to thrombus in his portal vein. -GI consulted, appreciate recs -Trend LFTs -Follow-up acute hepatitis labs    Normocytic anemia  H&H stable Dr. Ellin Saba recommended anemia workup   hypokalemia  -Monitoring repleted Checking magnesium  hypertension  -Stable continue home meds  History of GI bleed: H&H stable, monitoring   COPD -No signs of exacerbation -Continue Incruse Ellipta and Claritin -As needed albuterol nebs   --------------------------------------------------------------------------------------------------------------------- Nutritional status:  The patient's BMI is: Body mass  index is 32.77 kg/m. I agree with the assessment and plan as outlined below: Nutrition Status:       Skin Assessment: I have examined the patient's skin and I agree with the wound assessment as performed by wound care team As outlined belowe: Pressure Injury 11/01/21 Rectum Right;Lower Stage 2 -  Partial thickness loss of dermis presenting as a shallow open injury with a red, pink wound bed without slough. (Active)  11/01/21 0538  Location: Rectum  Location Orientation: Right;Lower  Staging: Stage 2 -  Partial thickness loss of dermis presenting as a shallow open injury with a red, pink wound bed without slough.  Wound Description (Comments):   Present on Admission: Yes     ------------------------------------------------------------------------------------------------------------------------------------------------  DVT prophylaxis:  Heparin drip apixaban (ELIQUIS) tablet 10 mg  apixaban (ELIQUIS) tablet 5 mg   Code Status:   Code Status: Limited: Do not attempt resuscitation (DNR) -DNR-LIMITED -Do Not Intubate/DNI   Family Communication: No family member present at bedside-  -Advance care planning has been discussed.   Admission status:   Status is: Inpatient Remains inpatient appropriate because: Needing IV heparin for portal vein thrombosis GI evaluation   Disposition: From  - home             Planning for discharge in 1-2 days: to SNF   Procedures:   No admission procedures for hospital encounter.   Antimicrobials:  Anti-infectives (From admission, onward)    Start     Dose/Rate Route Frequency Ordered Stop   04/22/23 2000  piperacillin-tazobactam (ZOSYN) IVPB 3.375 g        3.375 g 12.5 mL/hr over 240 Minutes Intravenous Every 8 hours 04/22/23 1938          Medication:   apixaban  10 mg Oral BID   Followed by   Melene Muller ON 05/01/2023] apixaban  5 mg Oral BID   B-complex with vitamin C  1 tablet Oral Daily   cholecalciferol  1,000 Units Oral Daily    latanoprost  1 drop Both Eyes QHS   loratadine  10 mg Oral Daily   umeclidinium bromide  1 puff Inhalation Daily    albuterol, ibuprofen, ondansetron **OR** ondansetron (ZOFRAN) IV, senna-docusate   Objective:   Vitals:   04/23/23 1258 04/23/23 2013 04/24/23 0443 04/24/23 0915  BP: (!) 89/46 (!) 112/46 125/75   Pulse: 88 70 79   Resp: 19  16   Temp: 97.9 F (36.6 C) (!) 100.4 F (38 C) 98.5 F (36.9 C)   TempSrc: Oral Oral Oral   SpO2: 100% 97% 100% 99%  Weight:      Height:        Intake/Output Summary (Last 24 hours) at 04/24/2023 1223 Last data filed at 04/24/2023 0444 Gross per 24 hour  Intake 480 ml  Output 400 ml  Net 80 ml   Filed Weights   04/23/23 0050  Weight: 92.1 kg     Physical examination:         General:  AAO x 3,  cooperative, no distress;   HEENT:  Normocephalic, PERRL, otherwise with in Normal limits   Neuro:  CNII-XII intact. , normal motor and sensation, reflexes intact   Lungs:   Clear to auscultation BL, Respirations unlabored,  No wheezes / crackles  Cardio:    S1/S2, RRR, No murmure, No Rubs or Gallops   Abdomen:  Soft, non-tender, bowel sounds active all four quadrants, no guarding or peritoneal signs.  Muscular  skeletal:  Limited exam -global generalized weaknesses - in bed, able to move all 4 extremities,   2+ pulses,  symmetric, No pitting edema  Skin:  Dry, warm to touch, negative for any Rashes,  Wounds: Please see nursing documentation  Pressure Injury 11/01/21 Rectum Right;Lower Stage 2 -  Partial thickness loss of dermis presenting as a shallow open injury with a red, pink wound bed without slough. (Active)  11/01/21 0538  Location: Rectum  Location Orientation: Right;Lower  Staging: Stage 2 -  Partial thickness loss of dermis presenting as a shallow open injury with a red, pink wound bed without slough.  Wound Description (Comments):   Present on Admission: Yes              ------------------------------------------------------------------------------------------------------------------------------------------    LABs:     Latest Ref Rng & Units 04/24/2023    1:57 AM 04/23/2023    4:41 AM 04/22/2023    2:39 PM  CBC  WBC 4.0 - 10.5 K/uL 12.7  12.1  13.2   Hemoglobin 13.0 - 17.0 g/dL 9.5  9.3  30.8   Hematocrit 39.0 - 52.0 % 29.7  28.9  33.6   Platelets 150 - 400 K/uL 229  204  231       Latest Ref Rng & Units 04/23/2023    3:23 PM 04/23/2023    4:41 AM 04/22/2023    2:39 PM  CMP  Glucose 70 - 99 mg/dL  657  846   BUN 8 - 23 mg/dL  23  22   Creatinine 9.62 - 1.24 mg/dL  9.52  8.41   Sodium 324 - 145 mmol/L  137  135   Potassium 3.5 - 5.1 mmol/L  3.4  3.3   Chloride 98 - 111 mmol/L  108  103   CO2 22 - 32 mmol/L  20  21   Calcium 8.9 - 10.3 mg/dL  8.2  8.6   Total Protein 6.5 - 8.1 g/dL 6.8  6.1  7.2   Total Bilirubin 0.0 - 1.2 mg/dL 1.9  2.4  2.5   Alkaline Phos 38 - 126 U/L 85  75  84   AST 15 - 41 U/L 112  101  183   ALT 0 - 44 U/L 146  139  177        Micro Results Recent Results (from the past 240 hours)  Resp panel by RT-PCR (RSV, Flu A&B, Covid) Anterior Nasal Swab     Status: None   Collection Time: 04/22/23 12:44 PM   Specimen: Anterior Nasal Swab  Result Value Ref Range Status   SARS Coronavirus 2 by RT PCR NEGATIVE NEGATIVE Final    Comment: (NOTE) SARS-CoV-2 target nucleic acids are NOT DETECTED.  The SARS-CoV-2 RNA is generally detectable in upper respiratory specimens during the acute phase of infection.  The lowest concentration of SARS-CoV-2 viral copies this assay can detect is 138 copies/mL. A negative result does not preclude SARS-Cov-2 infection and should not be used as the sole basis for treatment or other patient management decisions. A negative result may occur with  improper specimen collection/handling, submission of specimen other than nasopharyngeal swab, presence of viral mutation(s) within the areas  targeted by this assay, and inadequate number of viral copies(<138 copies/mL). A negative result must be combined with clinical observations, patient history, and epidemiological information. The expected result is Negative.  Fact Sheet for Patients:  BloggerCourse.com  Fact Sheet for Healthcare Providers:  SeriousBroker.it  This test is no t yet approved or cleared by the [place] FDA and  has been authorized for detection and/or diagnosis of SARS-CoV-2 by FDA under an Emergency Use Authorization (EUA). This EUA will remain  in effect (meaning this test can be used) for the duration of the COVID-19 declaration under Section 564(b)(1) of the Act, 21 U.S.C.section 360bbb-3(b)(1), unless the authorization is terminated  or revoked sooner.       Influenza A by PCR NEGATIVE NEGATIVE Final   Influenza B by PCR NEGATIVE NEGATIVE Final    Comment: (NOTE) The Xpert Xpress SARS-CoV-2/FLU/RSV plus assay is intended as an aid in the diagnosis of influenza from Nasopharyngeal swab specimens and should not be used as a sole basis for treatment. Nasal washings and aspirates are unacceptable for Xpert Xpress SARS-CoV-2/FLU/RSV testing.  Fact Sheet for Patients: BloggerCourse.com  Fact Sheet for Healthcare Providers: SeriousBroker.it  This test is not yet approved or cleared by the [place] FDA and has been authorized for detection and/or diagnosis of SARS-CoV-2 by FDA under an Emergency Use Authorization (EUA). This EUA will remain in effect (meaning this test can be used) for the duration of the COVID-19 declaration under Section 564(b)(1) of the Act, 21 U.S.C. section 360bbb-3(b)(1), unless the authorization is terminated or revoked.     Resp Syncytial Virus by PCR NEGATIVE NEGATIVE Final    Comment: (NOTE) Fact Sheet for  Patients: BloggerCourse.com  Fact Sheet for Healthcare Providers: SeriousBroker.it  This test is not yet approved or cleared by the [place] FDA and has been authorized for detection and/or diagnosis of SARS-CoV-2 by FDA under an Emergency Use Authorization (EUA). This EUA will remain in effect (meaning this test can be used) for the duration of the COVID-19 declaration under Section 564(b)(1) of the Act, 21 U.S.C. section 360bbb-3(b)(1), unless the authorization is terminated or revoked.  Performed at [facility], [address] [place], [place] [address]   Blood culture (routine x 2)     Status: None (Preliminary result)   Collection Time: 04/22/23  2:23 PM   Specimen: BLOOD  Result Value Ref Range Status   Specimen Description BLOOD LEFT ANTECUBITAL  Final   Special Requests   Final    BOTTLES DRAWN AEROBIC AND ANAEROBIC Blood Culture results may not be optimal due to an inadequate volume of blood received in culture bottles   Culture   Final    NO GROWTH 2 DAYS Performed at [facility], [address] [place], [place] [address]    Report Status PENDING  Incomplete  Blood culture (routine x 2)     Status: None (Preliminary result)   Collection Time: 04/22/23  2:39 PM   Specimen: BLOOD  Result Value Ref Range Status   Specimen Description BLOOD BLOOD RIGHT FOREARM  Final   Special Requests   Final    BOTTLES  DRAWN AEROBIC AND ANAEROBIC Blood Culture adequate volume   Culture   Final    NO GROWTH 2 DAYS Performed at [facility], [address] [place], [place] [address]    Report Status PENDING  Incomplete    Radiology Reports No results found.   SIGNED: Kendell Bane, MD, FHM. FAAFP. [facility] - [facility] hospitalist Time spent - 55 min.  In seeing, evaluating and examining the patient. Reviewing medical records, labs, drawn plan of care. [facility],  Pager (please use [email or web address] to page/ text) Please  use Epic Secure Chat for non-urgent communication (7AM-7PM)  If 7PM-7AM, please contact night-coverage [email or web address], 04/24/2023, 12:23 PM

## 2023-04-24 NOTE — Consult Note (Signed)
PHARMACY - ANTICOAGULATION  Pharmacy Consult for Heparin Indication:  portal vein thrombosis Brief A/P: Heparin level subtherapeutic Increase Heparin rate  No Known Allergies  Patient Measurements: Heparin Dosing Weight: 83.6 kg  Vital Signs: Temp: 100.4 F (38 C) (01/29 2013) Temp Source: Oral (01/29 2013) BP: 112/46 (01/29 2013) Pulse Rate: 70 (01/29 2013)  Labs: Recent Labs    04/22/23 1439 04/22/23 1948 04/23/23 0441 04/23/23 1452 04/23/23 1523 04/24/23 0157  HGB 10.6*  --  9.3*  --   --  9.5*  HCT 33.6*  --  28.9*  --   --  29.7*  PLT 231  --  204  --   --  229  APTT  --  27  --   --   --   --   LABPROT  --  15.7*  --   --  15.2  --   INR  --  1.2  --   --  1.2  --   HEPARINUNFRC  --   --  <0.10* <0.10*  --  <0.10*  CREATININE 0.95  --  0.96  --   --   --    Estimated Creatinine Clearance: 63 mL/min (by C-G formula based on SCr of 0.96 mg/dL).  Assessment: 83 y.o. male with portal vein thrombosis for heparin.   Heparin level still undetectable on 1950 units/hr and 2000 unit bolus. No bleeding or IV issues per RN. Level drawn appropriately in opposite extremity   Goal of Therapy:  Heparin level 0.3-0.7 units/ml Monitor platelets by anticoagulation protocol: Yes   Plan:  Rebolus Heparin 2000 units IV, then increase heparin 2250 units/hr Check heparin level in 8 hours  Arabella Merles, PharmD. Clinical Pharmacist 04/24/2023 2:59 AM

## 2023-04-25 ENCOUNTER — Telehealth: Payer: Self-pay | Admitting: Gastroenterology

## 2023-04-25 DIAGNOSIS — Z87891 Personal history of nicotine dependence: Secondary | ICD-10-CM | POA: Diagnosis not present

## 2023-04-25 DIAGNOSIS — R519 Headache, unspecified: Secondary | ICD-10-CM | POA: Diagnosis not present

## 2023-04-25 DIAGNOSIS — K219 Gastro-esophageal reflux disease without esophagitis: Secondary | ICD-10-CM | POA: Diagnosis not present

## 2023-04-25 DIAGNOSIS — M47814 Spondylosis without myelopathy or radiculopathy, thoracic region: Secondary | ICD-10-CM | POA: Diagnosis not present

## 2023-04-25 DIAGNOSIS — D649 Anemia, unspecified: Secondary | ICD-10-CM | POA: Diagnosis not present

## 2023-04-25 DIAGNOSIS — R972 Elevated prostate specific antigen [PSA]: Secondary | ICD-10-CM | POA: Diagnosis not present

## 2023-04-25 DIAGNOSIS — R5383 Other fatigue: Secondary | ICD-10-CM | POA: Diagnosis not present

## 2023-04-25 DIAGNOSIS — M6281 Muscle weakness (generalized): Secondary | ICD-10-CM | POA: Diagnosis not present

## 2023-04-25 DIAGNOSIS — M2578 Osteophyte, vertebrae: Secondary | ICD-10-CM | POA: Diagnosis not present

## 2023-04-25 DIAGNOSIS — Z7901 Long term (current) use of anticoagulants: Secondary | ICD-10-CM | POA: Diagnosis not present

## 2023-04-25 DIAGNOSIS — E559 Vitamin D deficiency, unspecified: Secondary | ICD-10-CM | POA: Diagnosis not present

## 2023-04-25 DIAGNOSIS — Z79899 Other long term (current) drug therapy: Secondary | ICD-10-CM | POA: Diagnosis not present

## 2023-04-25 DIAGNOSIS — D6851 Activated protein C resistance: Secondary | ICD-10-CM | POA: Diagnosis not present

## 2023-04-25 DIAGNOSIS — Z9181 History of falling: Secondary | ICD-10-CM | POA: Diagnosis not present

## 2023-04-25 DIAGNOSIS — M47816 Spondylosis without myelopathy or radiculopathy, lumbar region: Secondary | ICD-10-CM | POA: Diagnosis not present

## 2023-04-25 DIAGNOSIS — N138 Other obstructive and reflux uropathy: Secondary | ICD-10-CM | POA: Diagnosis not present

## 2023-04-25 DIAGNOSIS — R262 Difficulty in walking, not elsewhere classified: Secondary | ICD-10-CM | POA: Diagnosis not present

## 2023-04-25 DIAGNOSIS — Z8546 Personal history of malignant neoplasm of prostate: Secondary | ICD-10-CM | POA: Diagnosis not present

## 2023-04-25 DIAGNOSIS — K439 Ventral hernia without obstruction or gangrene: Secondary | ICD-10-CM | POA: Diagnosis not present

## 2023-04-25 DIAGNOSIS — I700 Atherosclerosis of aorta: Secondary | ICD-10-CM | POA: Diagnosis not present

## 2023-04-25 DIAGNOSIS — C61 Malignant neoplasm of prostate: Secondary | ICD-10-CM | POA: Diagnosis not present

## 2023-04-25 DIAGNOSIS — R279 Unspecified lack of coordination: Secondary | ICD-10-CM | POA: Diagnosis not present

## 2023-04-25 DIAGNOSIS — Z833 Family history of diabetes mellitus: Secondary | ICD-10-CM | POA: Diagnosis not present

## 2023-04-25 DIAGNOSIS — Z86718 Personal history of other venous thrombosis and embolism: Secondary | ICD-10-CM | POA: Diagnosis not present

## 2023-04-25 DIAGNOSIS — R293 Abnormal posture: Secondary | ICD-10-CM | POA: Diagnosis not present

## 2023-04-25 DIAGNOSIS — R2681 Unsteadiness on feet: Secondary | ICD-10-CM | POA: Diagnosis not present

## 2023-04-25 DIAGNOSIS — I81 Portal vein thrombosis: Secondary | ICD-10-CM | POA: Diagnosis not present

## 2023-04-25 DIAGNOSIS — I10 Essential (primary) hypertension: Secondary | ICD-10-CM | POA: Diagnosis not present

## 2023-04-25 DIAGNOSIS — N281 Cyst of kidney, acquired: Secondary | ICD-10-CM | POA: Diagnosis not present

## 2023-04-25 DIAGNOSIS — Z818 Family history of other mental and behavioral disorders: Secondary | ICD-10-CM | POA: Diagnosis not present

## 2023-04-25 DIAGNOSIS — D6852 Prothrombin gene mutation: Secondary | ICD-10-CM | POA: Diagnosis not present

## 2023-04-25 DIAGNOSIS — M1990 Unspecified osteoarthritis, unspecified site: Secondary | ICD-10-CM | POA: Diagnosis not present

## 2023-04-25 DIAGNOSIS — R600 Localized edema: Secondary | ICD-10-CM | POA: Diagnosis not present

## 2023-04-25 DIAGNOSIS — N401 Enlarged prostate with lower urinary tract symptoms: Secondary | ICD-10-CM | POA: Diagnosis not present

## 2023-04-25 DIAGNOSIS — R531 Weakness: Secondary | ICD-10-CM | POA: Diagnosis not present

## 2023-04-25 DIAGNOSIS — K7689 Other specified diseases of liver: Secondary | ICD-10-CM | POA: Diagnosis not present

## 2023-04-25 DIAGNOSIS — M2559 Pain in other specified joint: Secondary | ICD-10-CM | POA: Diagnosis not present

## 2023-04-25 DIAGNOSIS — R351 Nocturia: Secondary | ICD-10-CM | POA: Diagnosis not present

## 2023-04-25 DIAGNOSIS — J449 Chronic obstructive pulmonary disease, unspecified: Secondary | ICD-10-CM | POA: Diagnosis not present

## 2023-04-25 DIAGNOSIS — H4020X0 Unspecified primary angle-closure glaucoma, stage unspecified: Secondary | ICD-10-CM | POA: Diagnosis not present

## 2023-04-25 DIAGNOSIS — K5730 Diverticulosis of large intestine without perforation or abscess without bleeding: Secondary | ICD-10-CM | POA: Diagnosis not present

## 2023-04-25 LAB — COMPREHENSIVE METABOLIC PANEL
ALT: 87 U/L — ABNORMAL HIGH (ref 0–44)
AST: 31 U/L (ref 15–41)
Albumin: 2 g/dL — ABNORMAL LOW (ref 3.5–5.0)
Alkaline Phosphatase: 79 U/L (ref 38–126)
Anion gap: 8 (ref 5–15)
BUN: 13 mg/dL (ref 8–23)
CO2: 23 mmol/L (ref 22–32)
Calcium: 8.2 mg/dL — ABNORMAL LOW (ref 8.9–10.3)
Chloride: 108 mmol/L (ref 98–111)
Creatinine, Ser: 0.78 mg/dL (ref 0.61–1.24)
GFR, Estimated: >60 mL/min (ref >60)
Glucose, Bld: 102 mg/dL — ABNORMAL HIGH (ref 70–99)
Potassium: 3.5 mmol/L (ref 3.5–5.1)
Sodium: 139 mmol/L (ref 135–145)
Total Bilirubin: 1.2 mg/dL (ref 0.0–1.2)
Total Protein: 5.9 g/dL — ABNORMAL LOW (ref 6.5–8.1)

## 2023-04-25 LAB — LUPUS ANTICOAGULANT PANEL
DRVVT: 41 s (ref 0.0–47.0)
PTT Lupus Anticoagulant: 45.3 s — ABNORMAL HIGH (ref 0.0–43.5)

## 2023-04-25 LAB — FACTOR V LEIDEN

## 2023-04-25 LAB — CARDIOLIPIN ANTIBODIES, IGG, IGM, IGA
Anticardiolipin IgA: <9 [APL'U]/mL (ref 0–11)
Anticardiolipin IgG: <9 [GPL'U]/mL (ref 0–14)
Anticardiolipin IgM: 30 [MPL'U]/mL — ABNORMAL HIGH (ref 0–12)

## 2023-04-25 LAB — CBC
HCT: 30.1 % — ABNORMAL LOW (ref 39.0–52.0)
Hemoglobin: 9.2 g/dL — ABNORMAL LOW (ref 13.0–17.0)
MCH: 25.6 pg — ABNORMAL LOW (ref 26.0–34.0)
MCHC: 30.6 g/dL (ref 30.0–36.0)
MCV: 83.6 fL (ref 80.0–100.0)
Platelets: 299 10*3/uL (ref 150–400)
RBC: 3.6 MIL/uL — ABNORMAL LOW (ref 4.22–5.81)
RDW: 19.8 % — ABNORMAL HIGH (ref 11.5–15.5)
WBC: 13 10*3/uL — ABNORMAL HIGH (ref 4.0–10.5)
nRBC: 0.2 % (ref 0.0–0.2)

## 2023-04-25 LAB — PTT-LA MIX: PTT-LA Mix: 44 s — ABNORMAL HIGH (ref 0.0–40.5)

## 2023-04-25 LAB — HEXAGONAL PHASE PHOSPHOLIPID: Hexagonal Phase Phospholipid: 10 s (ref 0–11)

## 2023-04-25 LAB — COPPER, SERUM: Copper: 159 ug/dL — ABNORMAL HIGH (ref 69–132)

## 2023-04-25 MED ORDER — DOCUSATE SODIUM 100 MG PO CAPS: 200.0000 mg | ORAL_CAPSULE | Freq: Every day | ORAL | 0 refills | Status: AC

## 2023-04-25 MED ORDER — FERROUS SULFATE 325 (65 FE) MG PO TABS: 325.0000 mg | ORAL_TABLET | Freq: Two times a day (BID) | ORAL | 3 refills | Status: AC

## 2023-04-25 MED ORDER — LUMIGAN 0.01 % OP SOLN: 1.0000 [drp] | Freq: Every day | OPHTHALMIC | Status: DC

## 2023-04-25 MED ORDER — LEVOFLOXACIN 750 MG PO TABS: 750.0000 mg | ORAL_TABLET | Freq: Every day | ORAL | 0 refills | Status: AC

## 2023-04-25 MED ORDER — APIXABAN 5 MG PO TABS: ORAL_TABLET | ORAL | 1 refills | Status: DC

## 2023-04-25 NOTE — TOC Transition Note (Signed)
Transition of Care [facility]) - Discharge Note   Patient Details  Name: Randy Russo MRN: [id number] Date of Birth: March 27, 1940  Transition of Care [facility] CM/SW Contact:  Villa Herb, LCSWA Phone Number: 04/25/2023, 12:50 PM  Clinical Narrative:    CSW spoke to [place] with [facility] who states they are ready for pt to return. Insurance Berkley Harvey has been approved at this time. CSW called for [facility] transport. Daughter updated on D/C. RN provided with room and report numbers. TOC signing off.   Final next level of care: Skilled Nursing Facility Barriers to Discharge: Barriers Resolved   Patient Goals and CMS Choice Patient states their goals for this hospitalization and ongoing recovery are:: go back to SNF CMS Medicare.gov Compare Post Acute Care list provided to:: Patient Choice offered to / list presented to : Patient [place] ownership interest in [facility].provided to:: Patient    Discharge Placement                Patient to be transferred to facility by: [facility] Name of family member notified: daughter Patient and family notified of of transfer: 04/25/23  Discharge Plan and Services Additional resources added to the After Visit Summary for   In-house Referral: Clinical Social Work Discharge Planning Services: CM Consult Post Acute Care Choice: Skilled Nursing Facility                               Social Drivers of Health (SDOH) Interventions SDOH Screenings   Food Insecurity: No Food Insecurity (04/23/2023)  Recent Concern: Food Insecurity - Food Insecurity Present (04/22/2023)  Housing: Low Risk  (04/23/2023)  Recent Concern: Housing - High Risk (04/13/2023)  Transportation Needs: No Transportation Needs (04/23/2023)  Utilities: Patient Unable To Answer (04/23/2023)  Financial Resource Strain: Low Risk  (11/30/2022)   Received from [facility]  Physical Activity: Unknown (12/27/2017)  Social Connections: Unknown (04/23/2023)  Tobacco Use:  Medium Risk (04/23/2023)  Health Literacy: Medium Risk (11/30/2022)   Received from [facility]     Readmission Risk Interventions     No data to display

## 2023-04-25 NOTE — Telephone Encounter (Signed)
Please make patient a hospital follow up in 3-4 weeks with [facility] or [facility] to discuss anemia.

## 2023-04-25 NOTE — Care Management Important Message (Signed)
Important Message  Patient Details  Name: Randy Russo MRN: [id number] Date of Birth: 21-Apr-1940   Important Message Given:  Yes - Medicare IM     Corey Harold 04/25/2023, 11:38 AM

## 2023-04-25 NOTE — Plan of Care (Signed)
   Problem: Health Behavior/Discharge Planning: Goal: Ability to manage health-related needs will improve Outcome: Progressing

## 2023-04-25 NOTE — Discharge Summary (Addendum)
Physician Discharge Summary   Patient: Randy Russo MRN: [id number] DOB: [birth year]  Admit date:     04/22/2023  Discharge date: 04/25/23  Discharge Physician: Kendell Bane   PCP: Assunta Found, MD   Recommendations at discharge:    Follow with the PCP and hematologist in 2-4 weeks Post follow-up hematologist Dr. Ellin Saba in 4 weeks for further investigation of spontaneous portal vein thrombosis.  Continue Eliquis for at least 6 months Continue PT OT, fall precautions, high risk of bleeding on Eliquis Pending labs to be followed by Dr. Ellin Saba: Spontaneous for portal vein thrombosis: Lupus anticoagulant, Antibeta 2 glycoprotein 1 antibodies, Anticardiolipin antibodies and factor V Leiden     Discharge Diagnoses: Principal Problem:   Portal vein thrombosis Active Problems:   Acute febrile illness   Elevated liver function tests   Normocytic anemia  Resolved Problems:   * No resolved hospital problems. *  Hospital Course: Randy Russo is a 83 y.o. male with medical history significant for generalized deconditioning, GI bleed, COPD, HTN, recurrent falls and osteoarthritis who presented to the ED via EMS from SNF, [facility], for evaluation of fever and generalized bodyaches.   On evaluation, patient reports feeling weak, generalized muscle aches and stiff.  He reports a chronic nonproductive cough from his COPD but denies any shortness of breath, abdominal pain, chest pain, headache, dizziness, dysuria, melena, nausea, vomiting, hematuria or back pain.   ED Course: Initial vitals showed temp 101.2, RR 18, HR 95, BP 108/62 and SpO2 96% on room air. Labs significant for WBC 13.2  AST/ALT 183/177, bilirubin 2.5, lactic acid 1.8-1.0    CT A/P shows small volume nonocclusive thrombus in the main portal vein CTA chest PE study negative for PE.  CTA chest PE study negative for PE.      ------------------------------------------------------------------------------------------------------------------------   Portal vein thrombosis with Fever -Xarelto fever and leukocytosis, cultures remain negative  -POA :fever, leukocytosis and generalized weakness and muscle aches found to have thrombosis of his portal vein.   No evidence of respiratory, skin or urinary infection.   -LFTs elevated but no radiological abnormalities in the liver or biliary tract. Septic thrombophlebitis is high on differential in the setting of systemic symptoms, elevated WBC, ESR and evidence of portal vein thrombosis in this elderly gentleman. -GI consulted, no further recommendations  -All cultures negative to date  -- was on IV antibiotic of Zosyn >>> switch to p.o. Levaquin -was on Heparin drip --- switch to Eliquis April 24, 2023  Per hematology recommendation Dr. Ellin Saba: DOAC (Eliquis) treatment for at least 6 months      Generalized weakness/recent falls, osteoarthritis, -Chronic history of generalized deconditioning Worsening in the setting of infection -PT/OT eval and treat -Continue vitamin supplementation   Transaminitis: Improved    Latest Ref Rng & Units 04/25/2023    5:06 AM 04/23/2023    3:23 PM 04/23/2023    4:41 AM  Hepatic Function  Total Protein 6.5 - 8.1 g/dL 5.9  6.8  6.1   Albumin 3.5 - 5.0 g/dL 2.0  2.3  2.1   AST 15 - 41 U/L 31  112  101   ALT 0 - 44 U/L 87  146  139   Alk Phosphatase 38 - 126 U/L 79  85  75   Total Bilirubin 0.0 - 1.2 mg/dL 1.2  1.9  2.4   Bilirubin, Direct 0.0 - 0.2 mg/dL  1.0       -In the setting of portal vein thrombosis, ?  Infection CT does not show any abnormalities of the liver or biliary system. Acetaminophen level slightly elevated to 39 however his elevated LFTs are likely due to thrombus in his portal vein. -GI consulted, appreciate recs -Hepatitis panel nonreactive    Normocytic anemia  -Anemia workup revealed iron  deficiency -Normal folate, B12 Iron/TIBC/Ferritin/ %Sat    Component Value Date/Time   IRON 19 (L) 04/24/2023 0934   TIBC 170 (L) 04/24/2023 0934   FERRITIN 300 04/24/2023 0934   IRONPCTSAT 11 (L) 04/24/2023 0934   Iron supplement initiated  hypokalemia  -Monitoring repleted  hypertension  -Stable continue home meds  History of GI bleed: H&H stable, monitoring   COPD -No signs of exacerbation -Continue Incruse Ellipta and Claritin -As needed albuterol nebs  Generalized severe debility -Status post PT OT evaluation, recommending SNF,-patient to be discharged to SNF       Consultants: Dr. Ellin Saba Procedures performed: Workup for portal vein thrombosis  Disposition: Nursing home Diet recommendation:  Discharge Diet Orders (From admission, onward)     Start     Ordered   04/25/23 0000  Diet - low sodium heart healthy        04/25/23 0802           Cardiac diet DISCHARGE MEDICATION: Allergies as of 04/25/2023   No Known Allergies      Medication List     PAUSE taking these medications    furosemide 20 MG tablet Wait to take this until: April 29, 2023 Commonly known as: LASIX Take 20 mg by mouth daily.       STOP taking these medications    Fish Oil 1200 MG Caps       TAKE these medications    acetaminophen 650 MG CR tablet Commonly known as: TYLENOL Take 650 mg by mouth every 4 (four) hours as needed for pain.   albuterol (2.5 MG/3ML) 0.083% nebulizer solution Commonly known as: PROVENTIL Take 3 mLs (2.5 mg total) by nebulization every 6 (six) hours as needed for wheezing or shortness of breath.   apixaban 5 MG Tabs tablet Commonly known as: ELIQUIS Take 2 tablets (10 mg total) by mouth 2 (two) times daily for 6 days, THEN 1 tablet (5 mg total) 2 (two) times daily. Start taking on: April 25, 2023   B-complex with vitamin C tablet Take 1 tablet by mouth daily.   Cholecalciferol 25 MCG (1000 UT) capsule Take 1 capsule  (1,000 Units total) by mouth daily.   docusate sodium 100 MG capsule Commonly known as: COLACE Take 2 capsules (200 mg total) by mouth daily.   ferrous sulfate 325 (65 FE) MG tablet Take 1 tablet (325 mg total) by mouth 2 (two) times daily with a meal.   latanoprost 0.005 % ophthalmic solution Commonly known as: XALATAN Place 1 drop into both eyes at bedtime.   levofloxacin 750 MG tablet Commonly known as: Levaquin Take 1 tablet (750 mg total) by mouth daily for 5 days.   loratadine 10 MG tablet Commonly known as: CLARITIN Take 1 tablet (10 mg total) by mouth daily.   Lumigan 0.01 % Soln Generic drug: bimatoprost Place 1 drop into both eyes at bedtime.   Spiriva Respimat 2.5 MCG/ACT Aers Generic drug: Tiotropium Bromide Monohydrate Inhale 2 puffs into the lungs daily.        Discharge Exam: Filed Weights   04/23/23 0050  Weight: 92.1 kg        General:  AAO x 3,  cooperative, no distress;  HEENT:  Normocephalic, PERRL, otherwise with in Normal limits   Neuro:  CNII-XII intact. , normal motor and sensation, reflexes intact   Lungs:   Clear to auscultation BL, Respirations unlabored,  No wheezes / crackles  Cardio:    S1/S2, RRR, No murmure, No Rubs or Gallops   Abdomen:  Soft, non-tender, bowel sounds active all four quadrants, no guarding or peritoneal signs.  Muscular  skeletal:  Limited exam -global generalized weaknesses - in bed, able to move all 4 extremities,   2+ pulses,  symmetric, No pitting edema  Skin:  Dry, warm to touch, negative for any Rashes,  Wounds: Please see nursing documentation  Pressure Injury 11/01/21 Rectum Right;Lower Stage 2 -  Partial thickness loss of dermis presenting as a shallow open injury with a red, pink wound bed without slough. (Active)  11/01/21 0538  Location: Rectum  Location Orientation: Right;Lower  Staging: Stage 2 -  Partial thickness loss of dermis presenting as a shallow open injury with a red, pink wound bed  without slough.  Wound Description (Comments):   Present on Admission: Yes          Condition at discharge: fair  The results of significant diagnostics from this hospitalization (including imaging, microbiology, ancillary and laboratory) are listed below for reference.   Imaging Studies: CT ABDOMEN PELVIS W CONTRAST Result Date: 04/22/2023 CLINICAL DATA:  Acute abdominal pain. Patient reports fever and body aches. EXAM: CT ABDOMEN AND PELVIS WITH CONTRAST TECHNIQUE: Multidetector CT imaging of the abdomen and pelvis was performed using the standard protocol following bolus administration of intravenous contrast. RADIATION DOSE REDUCTION: This exam was performed according to the departmental dose-optimization program which includes automated exposure control, adjustment of the mA and/or kV according to patient size and/or use of iterative reconstruction technique. CONTRAST:  OMNIPAQUE IOHEXOL 350 MG/ML SOLN COMPARISON:  CT 01/31/2021 FINDINGS: Lower chest: Assessed on concurrent chest CTA, reported separately. Hepatobiliary: Benign cyst in the subcapsular right lobe of the liver. No suspicious liver lesion. Unremarkable gallbladder. There is mild motion artifact which limits detailed assessment. Pancreas: Parenchymal atrophy. No ductal dilatation or inflammation. Spleen: Normal in size without focal abnormality. Adrenals/Urinary Tract: No adrenal nodule. No hydronephrosis or perinephric edema. Homogeneous renal enhancement with symmetric excretion on delayed phase imaging. There are multiple bilateral renal cysts. The largest cyst is in the right mid upper kidney measuring 10.5 cm. No further follow-up imaging is recommended. Urinary bladder is nondistended and not well assessed due to streak artifact from right hip arthroplasty. Stomach/Bowel: Midline ventral abdominal wall hernia contains short segment of small bowel. There is no associated wall thickening or obstruction. Right hemicolectomy.  Colonic diverticulosis without diverticulitis. Moderate volume of colonic stool. Previous right inguinal hernia has been repaired. Vascular/Lymphatic: Aortic atherosclerosis without aneurysm. 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 No enlarged lymph nodes in the abdomen or pelvis. Reproductive: Fiducial markers or clips in the prostate. Other: Postsurgical change in the right inguinal canal, previous right inguinal hernia has been repaired. Midline ventral abdominal wall hernia contains short segment of small bowel without inflammation or obstruction. No ascites or free air. Musculoskeletal: Right hip arthroplasty. Chronic left hip arthropathy with flattening of the femoral head, near complete joint space loss, subchondral cystic change and sclerosis. This is similar in appearance to 11/27/2022 pelvis radiograph. There is a left hip joint effusion. Multilevel degenerative change in the lumbar spine. IMPRESSION: 1. Small volume nonocclusive thrombus in the main portal  vein. 2. Midline ventral abdominal wall hernia contains short segment of small bowel without inflammation or obstruction. 3. Colonic diverticulosis without diverticulitis. 4. Chronic left hip arthropathy with flattening of the femoral head, near complete joint space loss, subchondral cystic change and sclerosis. There is a left hip joint effusion. 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 Clinical correlation is recommended in the setting of fever for hip infection, although chronic changes were seen on September 2024 radiograph and appear similar. Aortic Atherosclerosis (ICD10-I70.0). These results were called by telephone at the time of interpretation on 04/22/2023 at 6:57 pm to provider Gloris Manchester , who verbally acknowledged these results. Electronically Signed   By: Narda Rutherford M.D.   On: 04/22/2023 18:58   CT Angio Chest PE W and/or Wo Contrast Result Date: 04/22/2023 CLINICAL DATA:   Pulmonary embolism (PE) suspected, high prob Patient presents with fever and body aches. EXAM: CT ANGIOGRAPHY CHEST WITH CONTRAST TECHNIQUE: Multidetector CT imaging of the chest was performed using the standard protocol during bolus administration of intravenous contrast. Multiplanar CT image reconstructions and MIPs were obtained to evaluate the vascular anatomy. Performed in conjunction with CT of the abdomen and pelvis. RADIATION DOSE REDUCTION: This exam was performed according to the departmental dose-optimization program which includes automated exposure control, adjustment of the mA and/or kV according to patient size and/or use of iterative reconstruction technique. CONTRAST:  OMNIPAQUE IOHEXOL 350 MG/ML SOLN COMPARISON:  Chest radiograph earlier today. FINDINGS: Cardiovascular: There are no filling defects within the pulmonary arteries to suggest pulmonary embolus. Dilated main pulmonary artery at 3.7 cm. The thoracic aorta is tortuous. No acute aortic finding. The heart is upper normal in size. No pericardial effusion. Mediastinum/Nodes: No mediastinal or hilar adenopathy. Unremarkable esophagus. No thyroid nodule. Lungs/Pleura: Mild lower lobe bronchial thickening. Bandlike right lower lobe opacities favor atelectasis. There is no confluent airspace disease. No pleural fluid. No pulmonary mass or suspicious nodule. No features of pulmonary edema. Trachea and central most airways are clear. Upper Abdomen: Assessed on concurrent abdominopelvic CT, reported separately. Musculoskeletal: Diffuse thoracic spondylosis with anterior spurring, bulky anterior osteophytes in the lower cervical spine. Mild broad-based dextroscoliotic curvature. The right shoulder is dislocated with the humeral head directed anteriorly and flattening of the glenoid. There are erosive changes involving the shoulder joint which are suboptimally assessed this is a change from 11/05/2021 shoulder CT. Review of the MIP images  confirms the above findings. IMPRESSION: 1. No pulmonary embolus. 2. Mild lower lobe bronchial thickening, can be seen with bronchitis or reactive airways disease. 3. Right shoulder dislocation with the humeral head directed anteriorly and flattening of the glenoid. There are erosive changes involving the shoulder joint which are suboptimally assessed. This is a change from 11/05/2021 shoulder CT. Recommend correlation with history of shoulder pain. 4. Dilated main pulmonary artery, can be seen with pulmonary arterial hypertension. Electronically Signed   By: Narda Rutherford M.D.   On: 04/22/2023 18:47   US Abdomen Limited RUQ (LIVER/GB) Result Date: 04/22/2023 CLINICAL DATA:  Fever. EXAM: ULTRASOUND ABDOMEN LIMITED RIGHT UPPER QUADRANT COMPARISON:  CT of the abdomen pelvis dated 01/31/2021. FINDINGS: Gallbladder: No gallstones or wall thickening visualized. No sonographic Murphy sign noted by sonographer. Common bile duct: Diameter: 7 mm Liver: The liver demonstrates a normal echogenicity. There is a 1.8 x 1.3 x 1.7 cm cyst in the right lobe of the liver also present on the prior CT of 01/31/2021. Portal vein is patent on color Doppler imaging with normal direction of blood flow  towards the liver. Other: Partially visualized large right renal cyst measuring up to 11 cm. IMPRESSION: 1. No gallstone. 2. Small right liver cyst. 3. Partially visualized right renal cyst. Electronically Signed   By: Elgie Collard M.D.   On: 04/22/2023 17:27   DG Chest Port 1 View Result Date: 04/22/2023 CLINICAL DATA:  Cough EXAM: PORTABLE CHEST 1 VIEW COMPARISON:  X-ray 04/12/2023 FINDINGS: Slight linear opacity lung bases likely scar or atelectasis. No consolidation, pneumothorax or effusion. No edema. Normal cardiopericardial silhouette. Tortuous ectatic aorta. Degenerative changes of the spine with curvature. Fixation hardware along the lower cervical spine. IMPRESSION: Minimal basilar atelectasis.  Tortuous ectatic aorta.  Electronically Signed   By: Karen Kays M.D.   On: 04/22/2023 13:22   DG Chest Port 1 View Result Date: 04/12/2023 CLINICAL DATA:  Short of breath, lower extremity edema EXAM: PORTABLE CHEST 1 VIEW COMPARISON:  11/29/2022 FINDINGS: Single frontal view of the chest demonstrates a stable cardiac silhouette. Continued ectasia of the thoracic aorta. No airspace disease, effusion, or pneumothorax. No acute bony abnormality. IMPRESSION: 1. No acute intrathoracic process. Electronically Signed   By: Sharlet Salina M.D.   On: 04/12/2023 18:43    Microbiology: Results for orders placed or performed during the hospital encounter of 04/22/23  Resp panel by RT-PCR (RSV, Flu A&B, Covid) Anterior Nasal Swab     Status: None   Collection Time: 04/22/23 12:44 PM   Specimen: Anterior Nasal Swab  Result Value Ref Range Status   SARS Coronavirus 2 by RT PCR NEGATIVE NEGATIVE Final    Comment: (NOTE) SARS-CoV-2 target nucleic acids are NOT DETECTED.  The SARS-CoV-2 RNA is generally detectable in upper respiratory specimens during the acute phase of infection. The lowest concentration of SARS-CoV-2 viral copies this assay can detect is 138 copies/mL. A negative result does not preclude SARS-Cov-2 infection and should not be used as the sole basis for treatment or other patient management decisions. A negative result may occur with  improper specimen collection/handling, submission of specimen other than nasopharyngeal swab, presence of viral mutation(s) within the areas targeted by this assay, and inadequate number of viral copies(<138 copies/mL). A negative result must be combined with clinical observations, patient history, and epidemiological information. The expected result is Negative.  Fact Sheet for Patients:  BloggerCourse.com  Fact Sheet for Healthcare Providers:  SeriousBroker.it  This test is no t yet approved or cleared by the [place]  FDA and  has been authorized for detection and/or diagnosis of SARS-CoV-2 by FDA under an Emergency Use Authorization (EUA). This EUA will remain  in effect (meaning this test can be used) for the duration of the COVID-19 declaration under Section 564(b)(1) of the Act, 21 U.S.C.section 360bbb-3(b)(1), unless the authorization is terminated  or revoked sooner.       Influenza A by PCR NEGATIVE NEGATIVE Final   Influenza B by PCR NEGATIVE NEGATIVE Final    Comment: (NOTE) The Xpert Xpress SARS-CoV-2/FLU/RSV plus assay is intended as an aid in the diagnosis of influenza from Nasopharyngeal swab specimens and should not be used as a sole basis for treatment. Nasal washings and aspirates are unacceptable for Xpert Xpress SARS-CoV-2/FLU/RSV testing.  Fact Sheet for Patients: BloggerCourse.com  Fact Sheet for Healthcare Providers: SeriousBroker.it  This test is not yet approved or cleared by the [place] FDA and has been authorized for detection and/or diagnosis of SARS-CoV-2 by FDA under an Emergency Use Authorization (EUA). This EUA will remain in effect (meaning this test can be  used) for the duration of the COVID-19 declaration under Section 564(b)(1) of the Act, 21 U.S.C. section 360bbb-3(b)(1), unless the authorization is terminated or revoked.     Resp Syncytial Virus by PCR NEGATIVE NEGATIVE Final    Comment: (NOTE) Fact Sheet for Patients: BloggerCourse.com  Fact Sheet for Healthcare Providers: SeriousBroker.it  This test is not yet approved or cleared by the [place] FDA and has been authorized for detection and/or diagnosis of SARS-CoV-2 by FDA under an Emergency Use Authorization (EUA). This EUA will remain in effect (meaning this test can be used) for the duration of the COVID-19 declaration under Section 564(b)(1) of the Act, 21 U.S.C. section  360bbb-3(b)(1), unless the authorization is terminated or revoked.  Performed at [facility], [address] [place], [place] [address]   Blood culture (routine x 2)     Status: None (Preliminary result)   Collection Time: 04/22/23  2:23 PM   Specimen: BLOOD  Result Value Ref Range Status   Specimen Description BLOOD LEFT ANTECUBITAL  Final   Special Requests   Final    BOTTLES DRAWN AEROBIC AND ANAEROBIC Blood Culture results may not be optimal due to an inadequate volume of blood received in culture bottles   Culture   Final    NO GROWTH 3 DAYS Performed at [facility], [address], [place], [place] [address]    Report Status PENDING  Incomplete  Blood culture (routine x 2)     Status: None (Preliminary result)   Collection Time: 04/22/23  2:39 PM   Specimen: BLOOD  Result Value Ref Range Status   Specimen Description BLOOD BLOOD RIGHT FOREARM  Final   Special Requests   Final    BOTTLES DRAWN AEROBIC AND ANAEROBIC Blood Culture adequate volume   Culture   Final    NO GROWTH 3 DAYS Performed at [facility], [address], [place], [place] [address]    Report Status PENDING  Incomplete    Labs: CBC: Recent Labs  Lab 04/22/23 1439 04/23/23 0441 04/24/23 0157 04/25/23 0506  WBC 13.2* 12.1* 12.7* 13.0*  HGB 10.6* 9.3* 9.5* 9.2*  HCT 33.6* 28.9* 29.7* 30.1*  MCV 82.2 82.8 83.0 83.6  PLT 231 204 229 299   Basic Metabolic Panel: Recent Labs  Lab 04/22/23 1439 04/23/23 0441 04/25/23 0506  NA 135 137 139  K 3.3* 3.4* 3.5  CL 103 108 108  CO2 21* 20* 23  GLUCOSE 125* 101* 102*  BUN 22 23 13   CREATININE 0.95 0.96 0.78  CALCIUM 8.6* 8.2* 8.2*   Liver Function Tests: Recent Labs  Lab 04/22/23 1439 04/23/23 0441 04/23/23 1523 04/25/23 0506  AST 183* 101* 112* 31  ALT 177* 139* 146* 87*  ALKPHOS 84 75 85 79  BILITOT 2.5* 2.4* 1.9* 1.2  PROT 7.2 6.1* 6.8 5.9*  ALBUMIN 2.4* 2.1* 2.3* 2.0*   CBG: No results for input(s): "GLUCAP" in the last 168  hours.  Discharge time spent: greater than 30 minutes.  Signed: Kendell Bane, MD [facility] 04/25/2023

## 2023-04-25 NOTE — Plan of Care (Signed)
  Problem: Health Behavior/Discharge Planning: Goal: Ability to manage health-related needs will improve Outcome: Adequate for Discharge   Problem: Clinical Measurements: Goal: Ability to maintain clinical measurements within normal limits will improve Outcome: Adequate for Discharge Goal: Diagnostic test results will improve Outcome: Adequate for Discharge   

## 2023-04-25 NOTE — Progress Notes (Signed)
Called report to receiving RN for [facility]. Discharge packet prepared by SWOT RN for discharge. IV site removed, clean dry and intact. Patient up to w/c and to transport via [facility] for discharge.

## 2023-04-26 LAB — ANA W/REFLEX IF POSITIVE: Anti Nuclear Antibody (ANA): NEGATIVE

## 2023-04-26 LAB — IGG: IgG (Immunoglobin G), Serum: 1330 mg/dL (ref 603–1613)

## 2023-04-26 LAB — BETA-2-GLYCOPROTEIN I ABS, IGG/M/A
Beta-2 Glyco I IgG: <9 GPI IgG units (ref 0–20)
Beta-2-Glycoprotein I IgA: <9 GPI IgA units (ref 0–25)
Beta-2-Glycoprotein I IgM: <9 GPI IgM units (ref 0–32)

## 2023-04-27 LAB — CULTURE, BLOOD (ROUTINE X 2)
Culture: NO GROWTH
Culture: NO GROWTH
Special Requests: ADEQUATE

## 2023-04-27 LAB — MITOCHONDRIAL ANTIBODIES: Mitochondrial M2 Ab, IgG: <20 U (ref 0.0–20.0)

## 2023-04-27 LAB — ANTI-SMOOTH MUSCLE ANTIBODY, IGG: F-Actin IgG: 24 U — ABNORMAL HIGH (ref 0–19)

## 2023-04-28 LAB — METHYLMALONIC ACID, SERUM: Methylmalonic Acid, Quantitative: 269 nmol/L (ref 0–378)

## 2023-05-01 DIAGNOSIS — E559 Vitamin D deficiency, unspecified: Secondary | ICD-10-CM | POA: Diagnosis not present

## 2023-05-01 DIAGNOSIS — I81 Portal vein thrombosis: Secondary | ICD-10-CM | POA: Diagnosis not present

## 2023-05-01 DIAGNOSIS — M6281 Muscle weakness (generalized): Secondary | ICD-10-CM | POA: Diagnosis not present

## 2023-05-01 DIAGNOSIS — R262 Difficulty in walking, not elsewhere classified: Secondary | ICD-10-CM | POA: Diagnosis not present

## 2023-05-01 DIAGNOSIS — H4020X0 Unspecified primary angle-closure glaucoma, stage unspecified: Secondary | ICD-10-CM | POA: Diagnosis not present

## 2023-05-01 DIAGNOSIS — C61 Malignant neoplasm of prostate: Secondary | ICD-10-CM | POA: Diagnosis not present

## 2023-05-01 DIAGNOSIS — J449 Chronic obstructive pulmonary disease, unspecified: Secondary | ICD-10-CM | POA: Diagnosis not present

## 2023-05-02 DIAGNOSIS — E559 Vitamin D deficiency, unspecified: Secondary | ICD-10-CM | POA: Diagnosis not present

## 2023-05-02 DIAGNOSIS — M6281 Muscle weakness (generalized): Secondary | ICD-10-CM | POA: Diagnosis not present

## 2023-05-02 DIAGNOSIS — H4020X0 Unspecified primary angle-closure glaucoma, stage unspecified: Secondary | ICD-10-CM | POA: Diagnosis not present

## 2023-05-02 DIAGNOSIS — J449 Chronic obstructive pulmonary disease, unspecified: Secondary | ICD-10-CM | POA: Diagnosis not present

## 2023-05-02 DIAGNOSIS — C61 Malignant neoplasm of prostate: Secondary | ICD-10-CM | POA: Diagnosis not present

## 2023-05-02 DIAGNOSIS — R262 Difficulty in walking, not elsewhere classified: Secondary | ICD-10-CM | POA: Diagnosis not present

## 2023-05-05 DIAGNOSIS — C61 Malignant neoplasm of prostate: Secondary | ICD-10-CM | POA: Diagnosis not present

## 2023-05-05 DIAGNOSIS — M6281 Muscle weakness (generalized): Secondary | ICD-10-CM | POA: Diagnosis not present

## 2023-05-05 DIAGNOSIS — J449 Chronic obstructive pulmonary disease, unspecified: Secondary | ICD-10-CM | POA: Diagnosis not present

## 2023-05-05 DIAGNOSIS — R262 Difficulty in walking, not elsewhere classified: Secondary | ICD-10-CM | POA: Diagnosis not present

## 2023-05-05 DIAGNOSIS — H4020X0 Unspecified primary angle-closure glaucoma, stage unspecified: Secondary | ICD-10-CM | POA: Diagnosis not present

## 2023-05-05 DIAGNOSIS — E559 Vitamin D deficiency, unspecified: Secondary | ICD-10-CM | POA: Diagnosis not present

## 2023-05-07 DIAGNOSIS — C61 Malignant neoplasm of prostate: Secondary | ICD-10-CM | POA: Diagnosis not present

## 2023-05-07 DIAGNOSIS — J449 Chronic obstructive pulmonary disease, unspecified: Secondary | ICD-10-CM | POA: Diagnosis not present

## 2023-05-07 DIAGNOSIS — H4020X0 Unspecified primary angle-closure glaucoma, stage unspecified: Secondary | ICD-10-CM | POA: Diagnosis not present

## 2023-05-07 DIAGNOSIS — R262 Difficulty in walking, not elsewhere classified: Secondary | ICD-10-CM | POA: Diagnosis not present

## 2023-05-07 DIAGNOSIS — M6281 Muscle weakness (generalized): Secondary | ICD-10-CM | POA: Diagnosis not present

## 2023-05-07 DIAGNOSIS — E559 Vitamin D deficiency, unspecified: Secondary | ICD-10-CM | POA: Diagnosis not present

## 2023-05-08 ENCOUNTER — Inpatient Hospital Stay: Payer: Medicare HMO | Attending: Oncology | Admitting: Oncology

## 2023-05-08 ENCOUNTER — Inpatient Hospital Stay: Payer: Medicare HMO

## 2023-05-08 VITALS — BP 124/68 | HR 90 | Temp 98.3°F | Resp 16

## 2023-05-08 DIAGNOSIS — I700 Atherosclerosis of aorta: Secondary | ICD-10-CM | POA: Insufficient documentation

## 2023-05-08 DIAGNOSIS — M2578 Osteophyte, vertebrae: Secondary | ICD-10-CM | POA: Diagnosis not present

## 2023-05-08 DIAGNOSIS — D6852 Prothrombin gene mutation: Secondary | ICD-10-CM | POA: Insufficient documentation

## 2023-05-08 DIAGNOSIS — I81 Portal vein thrombosis: Secondary | ICD-10-CM

## 2023-05-08 DIAGNOSIS — R531 Weakness: Secondary | ICD-10-CM | POA: Insufficient documentation

## 2023-05-08 DIAGNOSIS — Z86718 Personal history of other venous thrombosis and embolism: Secondary | ICD-10-CM | POA: Diagnosis not present

## 2023-05-08 DIAGNOSIS — Z79899 Other long term (current) drug therapy: Secondary | ICD-10-CM | POA: Insufficient documentation

## 2023-05-08 DIAGNOSIS — K7689 Other specified diseases of liver: Secondary | ICD-10-CM | POA: Diagnosis not present

## 2023-05-08 DIAGNOSIS — J449 Chronic obstructive pulmonary disease, unspecified: Secondary | ICD-10-CM | POA: Diagnosis not present

## 2023-05-08 DIAGNOSIS — Z833 Family history of diabetes mellitus: Secondary | ICD-10-CM | POA: Diagnosis not present

## 2023-05-08 DIAGNOSIS — N281 Cyst of kidney, acquired: Secondary | ICD-10-CM | POA: Diagnosis not present

## 2023-05-08 DIAGNOSIS — Z87891 Personal history of nicotine dependence: Secondary | ICD-10-CM | POA: Insufficient documentation

## 2023-05-08 DIAGNOSIS — Z818 Family history of other mental and behavioral disorders: Secondary | ICD-10-CM | POA: Diagnosis not present

## 2023-05-08 DIAGNOSIS — K439 Ventral hernia without obstruction or gangrene: Secondary | ICD-10-CM | POA: Insufficient documentation

## 2023-05-08 DIAGNOSIS — M47816 Spondylosis without myelopathy or radiculopathy, lumbar region: Secondary | ICD-10-CM | POA: Insufficient documentation

## 2023-05-08 DIAGNOSIS — R519 Headache, unspecified: Secondary | ICD-10-CM | POA: Diagnosis not present

## 2023-05-08 DIAGNOSIS — I10 Essential (primary) hypertension: Secondary | ICD-10-CM | POA: Insufficient documentation

## 2023-05-08 DIAGNOSIS — Z9181 History of falling: Secondary | ICD-10-CM

## 2023-05-08 DIAGNOSIS — Z7901 Long term (current) use of anticoagulants: Secondary | ICD-10-CM | POA: Diagnosis not present

## 2023-05-08 DIAGNOSIS — K5730 Diverticulosis of large intestine without perforation or abscess without bleeding: Secondary | ICD-10-CM | POA: Insufficient documentation

## 2023-05-08 DIAGNOSIS — D6851 Activated protein C resistance: Secondary | ICD-10-CM | POA: Insufficient documentation

## 2023-05-08 DIAGNOSIS — M1990 Unspecified osteoarthritis, unspecified site: Secondary | ICD-10-CM

## 2023-05-08 DIAGNOSIS — M2559 Pain in other specified joint: Secondary | ICD-10-CM | POA: Diagnosis not present

## 2023-05-08 DIAGNOSIS — R2681 Unsteadiness on feet: Secondary | ICD-10-CM | POA: Insufficient documentation

## 2023-05-08 DIAGNOSIS — R600 Localized edema: Secondary | ICD-10-CM | POA: Diagnosis not present

## 2023-05-08 DIAGNOSIS — K219 Gastro-esophageal reflux disease without esophagitis: Secondary | ICD-10-CM

## 2023-05-08 DIAGNOSIS — M47814 Spondylosis without myelopathy or radiculopathy, thoracic region: Secondary | ICD-10-CM | POA: Diagnosis not present

## 2023-05-08 LAB — CBC WITH DIFFERENTIAL/PLATELET
Abs Immature Granulocytes: 0.03 10*3/uL (ref 0.00–0.07)
Basophils Absolute: 0.1 10*3/uL (ref 0.0–0.1)
Basophils Relative: 1 %
Eosinophils Absolute: 0.1 10*3/uL (ref 0.0–0.5)
Eosinophils Relative: 2 %
HCT: 35.5 % — ABNORMAL LOW (ref 39.0–52.0)
Hemoglobin: 10.8 g/dL — ABNORMAL LOW (ref 13.0–17.0)
Immature Granulocytes: 1 %
Lymphocytes Relative: 16 %
Lymphs Abs: 0.9 10*3/uL (ref 0.7–4.0)
MCH: 27.1 pg (ref 26.0–34.0)
MCHC: 30.4 g/dL (ref 30.0–36.0)
MCV: 89 fL (ref 80.0–100.0)
Monocytes Absolute: 0.5 10*3/uL (ref 0.1–1.0)
Monocytes Relative: 8 %
Neutro Abs: 4.2 10*3/uL (ref 1.7–7.7)
Neutrophils Relative %: 72 %
Platelets: 365 10*3/uL (ref 150–400)
RBC: 3.99 MIL/uL — ABNORMAL LOW (ref 4.22–5.81)
RDW: 22.5 % — ABNORMAL HIGH (ref 11.5–15.5)
Smear Review: ADEQUATE
WBC: 5.8 10*3/uL (ref 4.0–10.5)
nRBC: 0 % (ref 0.0–0.2)

## 2023-05-08 LAB — ANTITHROMBIN III: AntiThromb III Func: 92 % (ref 75–120)

## 2023-05-08 LAB — COMPREHENSIVE METABOLIC PANEL
ALT: 38 U/L (ref 0–44)
AST: 24 U/L (ref 15–41)
Albumin: 3 g/dL — ABNORMAL LOW (ref 3.5–5.0)
Alkaline Phosphatase: 82 U/L (ref 38–126)
Anion gap: 7 (ref 5–15)
BUN: 15 mg/dL (ref 8–23)
CO2: 25 mmol/L (ref 22–32)
Calcium: 9.2 mg/dL (ref 8.9–10.3)
Chloride: 108 mmol/L (ref 98–111)
Creatinine, Ser: 0.72 mg/dL (ref 0.61–1.24)
GFR, Estimated: >60 mL/min (ref >60)
Glucose, Bld: 93 mg/dL (ref 70–99)
Potassium: 3.8 mmol/L (ref 3.5–5.1)
Sodium: 140 mmol/L (ref 135–145)
Total Bilirubin: 0.8 mg/dL (ref 0.0–1.2)
Total Protein: 7.4 g/dL (ref 6.5–8.1)

## 2023-05-08 NOTE — Progress Notes (Signed)
[facility]  ID: GASPARD ISBELL OB: [birth year]  MR#: [id number]  WUJ#:[id number]  Patient Care Team: Assunta Found, MD as PCP - General (Family Medicine) Jena Gauss, Gerrit Friends, MD as Consulting Physician (Gastroenterology)  CHIEF COMPLAINT: Unprovoked portal vein thrombus  PMH: Patient is an 83 year old male who has been referred to [place] for unprovoked portal vein thrombus.   Patient has past medical history significant for generalized deconditioning, history of GI bleed, COPD, hypertension, recurrent falls and osteoarthritis who is currently living at [facility] assisted living.  He was hospitalized from 04/22/2023 through 04/25/2023 for fever, weakness and muscle aches.  Workup included labs and imaging which showed leukocytosis but negative blood cultures and imaging revealed nonocclusive thrombus in main portal vein.  CTA chest PE study was negative.  LFTs were elevated but x-rays did not reveal abnormalities in the liver or biliary duct.  He was treated with IV antibiotics and IV heparin and switched to p.o. Levaquin and Eliquis.   Interval history:  Patient is adjusting to living at the assisted living facility.  Reports he has been taking his Eliquis as prescribed.  Denies any bleeding.  Appetite and energy levels are 100%.  Has an leg pain he rates 8 out of 10.  He is fairly sedentary as a wheelchair mainly.  He is able to walk with a walker occasionally.  Reports fall about 3 months ago.  He denies any family history of blood disorders.  He denies any personal history of blood clots.  Denies any recent surgeries.  He is a former smoker and smoked up until the age of 33 1 pack/day.  He has 2 children and is widowed.  His wife died approximately 20 years ago.  Reports he sustained a cervical fracture on 11/27/2022 after he fell on his head due to generalized weakness and unstable gait.  He has been nonambulatory ever since.   Past Medical History:  Diagnosis Date   Arthritis     Cancer [facility])    Prostate   COPD (chronic obstructive pulmonary disease) (HCC)    Diverticulitis    Hypertension    Lower GI bleed 12/27/2017    REVIEW OF SYSTEMS:   Review of Systems  Musculoskeletal:  Positive for joint pain.  Neurological:  Positive for weakness and headaches.    As per HPI. Otherwise, a complete review of systems is negative.  PAST MEDICAL HISTORY: Past Medical History:  Diagnosis Date   Arthritis    Cancer [facility])    Prostate   COPD (chronic obstructive pulmonary disease) (HCC)    Diverticulitis    Hypertension    Lower GI bleed 12/27/2017    PAST SURGICAL HISTORY: Past Surgical History:  Procedure Laterality Date   CATARACT EXTRACTION W/PHACO Left 11/16/2012   Procedure: CATARACT EXTRACTION PHACO AND INTRAOCULAR LENS PLACEMENT (IOC);  Surgeon: Gemma Payor, MD;  Location: [facility];  Service: Ophthalmology;  Laterality: Left;  CDE: 13.66   CATARACT EXTRACTION W/PHACO Right 01/18/2013   Procedure: CATARACT EXTRACTION PHACO AND INTRAOCULAR LENS PLACEMENT (IOC);  Surgeon: Gemma Payor, MD;  Location: [facility];  Service: Ophthalmology;  Laterality: Right;  CDE:8.79   CERVICAL FUSION  4 yrs ago   COLONOSCOPY N/A 12/28/2017   Procedure: COLONOSCOPY;  Surgeon: Malissa Hippo, MD;  Location: [facility];  Service: Endoscopy;  Laterality: N/A;   COLONOSCOPY N/A 01/01/2018   Procedure: COLONOSCOPY;  Surgeon: Malissa Hippo, MD;  Location: [facility];  Service: Endoscopy;  Laterality: N/A;  ESOPHAGOGASTRODUODENOSCOPY N/A 01/01/2018   Procedure: ESOPHAGOGASTRODUODENOSCOPY (EGD);  Surgeon: Malissa Hippo, MD;  Location: [facility];  Service: Endoscopy;  Laterality: N/A;   INGUINAL HERNIA REPAIR Right 03/12/2021   Procedure: HERNIA REPAIR INGUINAL ADULT;  Surgeon: Franky Macho, MD;  Location: [facility];  Service: General;  Laterality: Right;   JOINT REPLACEMENT Left 5 yrs ago   knee   PARTIAL COLECTOMY N/A 01/01/2018   Procedure: PARTIAL COLECTOMY;  Surgeon:  Franky Macho, MD;  Location: [facility];  Service: General;  Laterality: N/A;   right hip replacement Right 3 yrs ago    FAMILY HISTORY: Family History  Problem Relation Age of Onset   Diabetes Mother    Dementia Father     ADVANCED DIRECTIVES (Y/N):  N  HEALTH MAINTENANCE: Social History   Tobacco Use   Smoking status: Former    Types: Cigars   Smokeless tobacco: Never  Vaping Use   Vaping status: Never Used  Substance Use Topics   Alcohol use: No   Drug use: No     Colonoscopy:  PAP:  Bone density:  Lipid panel:  No Known Allergies  Current Outpatient Medications  Medication Sig Dispense Refill   acetaminophen (TYLENOL) 650 MG CR tablet Take 650 mg by mouth every 4 (four) hours as needed for pain.     albuterol (PROVENTIL) (2.5 MG/3ML) 0.083% nebulizer solution Take 3 mLs (2.5 mg total) by nebulization every 6 (six) hours as needed for wheezing or shortness of breath. 75 mL 12   apixaban (ELIQUIS) 5 MG TABS tablet Take 2 tablets (10 mg total) by mouth 2 (two) times daily for 6 days, THEN 1 tablet (5 mg total) 2 (two) times daily. 60 tablet 1   B Complex-C (B-COMPLEX WITH VITAMIN C) tablet Take 1 tablet by mouth daily.     Cholecalciferol 25 MCG (1000 UT) capsule Take 1 capsule (1,000 Units total) by mouth daily.     docusate sodium (COLACE) 100 MG capsule Take 2 capsules (200 mg total) by mouth daily. 10 capsule 0   ferrous sulfate 325 (65 FE) MG tablet Take 1 tablet (325 mg total) by mouth 2 (two) times daily with a meal. 30 tablet 3   furosemide (LASIX) 20 MG tablet Take 20 mg by mouth daily.     latanoprost (XALATAN) 0.005 % ophthalmic solution Place 1 drop into both eyes at bedtime.     loratadine (CLARITIN) 10 MG tablet Take 1 tablet (10 mg total) by mouth daily.     LUMIGAN 0.01 % SOLN Place 1 drop into both eyes at bedtime.     SPIRIVA RESPIMAT 2.5 MCG/ACT AERS Inhale 2 puffs into the lungs daily.     No current facility-administered medications for this visit.     OBJECTIVE: There were no vitals filed for this visit.   There is no height or weight on file to calculate BMI.    ECOG FS:3 - Symptomatic, >50% confined to bed  Physical Exam Constitutional:      Appearance: Normal appearance.  HENT:     Head: Normocephalic and atraumatic.  Eyes:     Pupils: Pupils are equal, round, and reactive to light.  Cardiovascular:     Rate and Rhythm: Normal rate and regular rhythm.     Heart sounds: Normal heart sounds. No murmur heard. Pulmonary:     Effort: Pulmonary effort is normal.     Breath sounds: Normal breath sounds. No wheezing.  Abdominal:     General: Bowel  sounds are normal. There is no distension.     Palpations: Abdomen is soft.     Tenderness: There is no abdominal tenderness.  Musculoskeletal:        General: Normal range of motion.     Cervical back: Normal range of motion.  Skin:    General: Skin is warm and dry.     Findings: No rash.  Neurological:     Mental Status: He is alert and oriented to person, place, and time.  Psychiatric:        Judgment: Judgment normal.     LAB RESULTS:  Lab Results  Component Value Date   NA 139 04/25/2023   K 3.5 04/25/2023   CL 108 04/25/2023   CO2 23 04/25/2023   GLUCOSE 102 (H) 04/25/2023   BUN 13 04/25/2023   CREATININE 0.78 04/25/2023   CALCIUM 8.2 (L) 04/25/2023   PROT 5.9 (L) 04/25/2023   ALBUMIN 2.0 (L) 04/25/2023   AST 31 04/25/2023   ALT 87 (H) 04/25/2023   ALKPHOS 79 04/25/2023   BILITOT 1.2 04/25/2023   GFRNONAA >60 04/25/2023   GFRAA >60 11/09/2018    Lab Results  Component Value Date   WBC 13.0 (H) 04/25/2023   NEUTROABS 4.9 04/12/2023   HGB 9.2 (L) 04/25/2023   HCT 30.1 (L) 04/25/2023   MCV 83.6 04/25/2023   PLT 299 04/25/2023     STUDIES: CT ABDOMEN PELVIS W CONTRAST Result Date: 04/22/2023 CLINICAL DATA:  Acute abdominal pain. Patient reports fever and body aches. EXAM: CT ABDOMEN AND PELVIS WITH CONTRAST TECHNIQUE: Multidetector CT imaging of the  abdomen and pelvis was performed using the standard protocol following bolus administration of intravenous contrast. RADIATION DOSE REDUCTION: This exam was performed according to the departmental dose-optimization program which includes automated exposure control, adjustment of the mA and/or kV according to patient size and/or use of iterative reconstruction technique. CONTRAST:  OMNIPAQUE IOHEXOL 350 MG/ML SOLN COMPARISON:  CT 01/31/2021 FINDINGS: Lower chest: Assessed on concurrent chest CTA, reported separately. Hepatobiliary: Benign cyst in the subcapsular right lobe of the liver. No suspicious liver lesion. Unremarkable gallbladder. There is mild motion artifact which limits detailed assessment. Pancreas: Parenchymal atrophy. No ductal dilatation or inflammation. Spleen: Normal in size without focal abnormality. Adrenals/Urinary Tract: No adrenal nodule. No hydronephrosis or perinephric edema. Homogeneous renal enhancement with symmetric excretion on delayed phase imaging. There are multiple bilateral renal cysts. The largest cyst is in the right mid upper kidney measuring 10.5 cm. No further follow-up imaging is recommended. Urinary bladder is nondistended and not well assessed due to streak artifact from right hip arthroplasty. Stomach/Bowel: Midline ventral abdominal wall hernia contains short segment of small bowel. There is no associated wall thickening or obstruction. Right hemicolectomy. Colonic diverticulosis without diverticulitis. Moderate volume of colonic stool. Previous right inguinal hernia has been repaired. Vascular/Lymphatic: Aortic atherosclerosis without aneurysm. 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 No enlarged lymph nodes in the abdomen or pelvis. Reproductive: Fiducial markers or clips in the prostate. Other: Postsurgical change in the right inguinal canal, previous right inguinal hernia has been repaired. Midline ventral abdominal  wall hernia contains short segment of small bowel without inflammation or obstruction. No ascites or free air. Musculoskeletal: Right hip arthroplasty. Chronic left hip arthropathy with flattening of the femoral head, near complete joint space loss, subchondral cystic change and sclerosis. This is similar in appearance to 11/27/2022 pelvis radiograph. There is a left hip joint effusion.  Multilevel degenerative change in the lumbar spine. IMPRESSION: 1. 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 2. Midline ventral abdominal wall hernia contains short segment of small bowel without inflammation or obstruction. 3. Colonic diverticulosis without diverticulitis. 4. Chronic left hip arthropathy with flattening of the femoral head, near complete joint space loss, subchondral cystic change and sclerosis. There is a left hip joint effusion. 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 Clinical correlation is recommended in the setting of fever for hip infection, although chronic changes were seen on September 2024 radiograph and appear similar. Aortic Atherosclerosis (ICD10-I70.0). These results were called by telephone at the time of interpretation on 04/22/2023 at 6:57 pm to provider Gloris Manchester , who verbally acknowledged these results. Electronically Signed   By: Narda Rutherford M.D.   On: 04/22/2023 18:58   CT Angio Chest PE W and/or Wo Contrast Result Date: 04/22/2023 CLINICAL DATA:  Pulmonary embolism (PE) suspected, high prob Patient presents with fever and body aches. EXAM: CT ANGIOGRAPHY CHEST WITH CONTRAST TECHNIQUE: Multidetector CT imaging of the chest was performed using the standard protocol during bolus administration of intravenous contrast. Multiplanar CT image reconstructions and MIPs were obtained to evaluate the vascular anatomy. Performed in conjunction with CT of the abdomen and pelvis. RADIATION DOSE REDUCTION: This exam was performed according to the departmental  dose-optimization program which includes automated exposure control, adjustment of the mA and/or kV according to patient size and/or use of iterative reconstruction technique. CONTRAST:  OMNIPAQUE IOHEXOL 350 MG/ML SOLN COMPARISON:  Chest radiograph earlier today. FINDINGS: Cardiovascular: There are no filling defects within the pulmonary arteries to suggest pulmonary embolus. Dilated main pulmonary artery at 3.7 cm. The thoracic aorta is tortuous. No acute aortic finding. The heart is upper normal in size. No pericardial effusion. Mediastinum/Nodes: No mediastinal or hilar adenopathy. Unremarkable esophagus. No thyroid nodule. Lungs/Pleura: Mild lower lobe bronchial thickening. Bandlike right lower lobe opacities favor atelectasis. There is no confluent airspace disease. No pleural fluid. No pulmonary mass or suspicious nodule. No features of pulmonary edema. Trachea and central most airways are clear. Upper Abdomen: Assessed on concurrent abdominopelvic CT, reported separately. Musculoskeletal: Diffuse thoracic spondylosis with anterior spurring, bulky anterior osteophytes in the lower cervical spine. Mild broad-based dextroscoliotic curvature. The right shoulder is dislocated with the humeral head directed anteriorly and flattening of the glenoid. There are erosive changes involving the shoulder joint which are suboptimally assessed this is a change from 11/05/2021 shoulder CT. Review of the MIP images confirms the above findings. IMPRESSION: 1. No pulmonary embolus. 2. Mild lower lobe bronchial thickening, can be seen with bronchitis or reactive airways disease. 3. Right shoulder dislocation with the humeral head directed anteriorly and flattening of the glenoid. There are erosive changes involving the shoulder joint which are suboptimally assessed. This is a change from 11/05/2021 shoulder CT. Recommend correlation with history of shoulder pain. 4. Dilated main pulmonary artery, can be seen with pulmonary  arterial hypertension. Electronically Signed   By: Narda Rutherford M.D.   On: 04/22/2023 18:47   US Abdomen Limited RUQ (LIVER/GB) Result Date: 04/22/2023 CLINICAL DATA:  Fever. EXAM: ULTRASOUND ABDOMEN LIMITED RIGHT UPPER QUADRANT COMPARISON:  CT of the abdomen pelvis dated 01/31/2021. FINDINGS: Gallbladder: No gallstones or wall thickening visualized. No sonographic Murphy sign noted by sonographer. Common bile duct: Diameter: 7 mm Liver: The liver demonstrates a normal echogenicity. There is a 1.8 x 1.3 x 1.7 cm cyst in the right lobe of the liver also present on the prior  CT of 01/31/2021. Portal vein is patent on color Doppler imaging with normal direction of blood flow towards the liver. Other: Partially visualized large right renal cyst measuring up to 11 cm. IMPRESSION: 1. No gallstone. 2. Small right liver cyst. 3. Partially visualized right renal cyst. Electronically Signed   By: Elgie Collard M.D.   On: 04/22/2023 17:27   DG Chest Port 1 View Result Date: 04/22/2023 CLINICAL DATA:  Cough EXAM: PORTABLE CHEST 1 VIEW COMPARISON:  X-ray 04/12/2023 FINDINGS: Slight linear opacity lung bases likely scar or atelectasis. No consolidation, pneumothorax or effusion. No edema. Normal cardiopericardial silhouette. Tortuous ectatic aorta. Degenerative changes of the spine with curvature. Fixation hardware along the lower cervical spine. IMPRESSION: Minimal basilar atelectasis.  Tortuous ectatic aorta. Electronically Signed   By: Karen Kays M.D.   On: 04/22/2023 13:22   DG Chest Port 1 View Result Date: 04/12/2023 CLINICAL DATA:  Short of breath, lower extremity edema EXAM: PORTABLE CHEST 1 VIEW COMPARISON:  11/29/2022 FINDINGS: Single frontal view of the chest demonstrates a stable cardiac silhouette. Continued ectasia of the thoracic aorta. No airspace disease, effusion, or pneumothorax. No acute bony abnormality. IMPRESSION: 1. No acute intrathoracic process. Electronically Signed   By: Sharlet Salina M.D.   On: 04/12/2023 18:43    ASSESSMENT:  1. Portal vein thrombosis (Primary) -Hospitalized from 04/22/2023 through 04/25/2023 for fever, weakness and muscle aches and found to have portal vein thrombus. -CT angio from 04/22/2023 did not reveal evidence of PE but did show dilated main pulmonary artery which could be seen in pulmonary arterial hypertension. -CT abdomen/pelvis showed small volume nonocclusive thrombus in main portal vein.  Midline ventral abdominal wall hernia without inflammation or obstruction, colonic diverticulosis without diverticulitis and chronic left hip arthropathy. -She was started on IV antibiotics and IV heparin and transition to p.o. Levaquin and Eliquis before discharge.  2.  Cervical fracture: -Secondary to a fall back d/t osteoarthritis in September 2024. -He was treated at [facility].  3.  Social history: -Former smoker quit over 40 years ago.  1 pack/day for 40 years. -He worked at a tobacco plant.  He is widowed for about 20 years and lived alone up until about 4 months ago.  He now lives at [facility]. -She has 2 children who are grown and do not live close by. -Denies any known family history of cancer.  Denies any exposure to chemicals such as Roundup.   PLAN:   1. Portal vein thrombosis (Primary) - He had a thrombotic episodes, most likely unprovoked/weakly provoked.  - He is tolerating Eliquis very well. - Given thrombus was unprovoked, it is likely he will need anticoagulation for at least 1 year. - Recommend checking hypercoagulable workup including factor V Leiden, prothrombin gene mutation, protein C and S activity and antigen, Antithrombin III, lupus anticoagulant, anticardiolipin antibody and antibeta 2 glycoprotein 1 antibody.  - RTC 4-6 weeks for follow-up.  - Cardiolipin antibodies, IgG, IgM, IgA; Future - Lupus anticoagulant panel; Future - Protein C, total; Future - Protein C activity; Future - Protein  S activity; Future - Protein S, total; Future - Antithrombin III; Future - Factor 5 leiden; Future - Beta-2-glycoprotein i abs, IgG/M/A; Future - Prothrombin gene mutation (Factor 2 Mutation); Future - CBC with Differential/Platelet - Comprehensive metabolic panel   Patient expressed understanding and was in agreement with this plan. He also understands that He can call clinic at any time with any questions, concerns, or complaints.  Cancer Staging  No matching staging information was found for the patient.   Mauro Kaufmann, NP   05/08/2023 9:44 AM

## 2023-05-09 DIAGNOSIS — R279 Unspecified lack of coordination: Secondary | ICD-10-CM | POA: Diagnosis not present

## 2023-05-09 DIAGNOSIS — J449 Chronic obstructive pulmonary disease, unspecified: Secondary | ICD-10-CM | POA: Diagnosis not present

## 2023-05-09 DIAGNOSIS — C61 Malignant neoplasm of prostate: Secondary | ICD-10-CM | POA: Diagnosis not present

## 2023-05-09 DIAGNOSIS — M6281 Muscle weakness (generalized): Secondary | ICD-10-CM | POA: Diagnosis not present

## 2023-05-09 DIAGNOSIS — R262 Difficulty in walking, not elsewhere classified: Secondary | ICD-10-CM | POA: Diagnosis not present

## 2023-05-09 DIAGNOSIS — I81 Portal vein thrombosis: Secondary | ICD-10-CM | POA: Diagnosis not present

## 2023-05-09 DIAGNOSIS — H4020X0 Unspecified primary angle-closure glaucoma, stage unspecified: Secondary | ICD-10-CM | POA: Diagnosis not present

## 2023-05-09 DIAGNOSIS — E559 Vitamin D deficiency, unspecified: Secondary | ICD-10-CM | POA: Diagnosis not present

## 2023-05-09 LAB — PROTEIN C ACTIVITY: Protein C Activity: 101 % (ref 73–180)

## 2023-05-09 LAB — DRVVT MIX: dRVVT Mix: 50.4 s — ABNORMAL HIGH (ref 0.0–40.4)

## 2023-05-09 LAB — LUPUS ANTICOAGULANT PANEL
DRVVT: 68.5 s — ABNORMAL HIGH (ref 0.0–47.0)
PTT Lupus Anticoagulant: 42.3 s (ref 0.0–43.5)

## 2023-05-09 LAB — PROTEIN S, TOTAL: Protein S Ag, Total: 85 % (ref 60–150)

## 2023-05-09 LAB — DRVVT CONFIRM: dRVVT Confirm: 0.9 {ratio} (ref 0.8–1.2)

## 2023-05-09 LAB — PROTEIN S ACTIVITY: Protein S Activity: 85 % (ref 63–140)

## 2023-05-10 LAB — PROTEIN C, TOTAL: Protein C, Total: 72 % (ref 60–150)

## 2023-05-10 LAB — BETA-2-GLYCOPROTEIN I ABS, IGG/M/A
Beta-2 Glyco I IgG: <9 GPI IgG units (ref 0–20)
Beta-2-Glycoprotein I IgA: 12 GPI IgA units (ref 0–25)
Beta-2-Glycoprotein I IgM: <9 GPI IgM units (ref 0–32)

## 2023-05-10 LAB — CARDIOLIPIN ANTIBODIES, IGG, IGM, IGA
Anticardiolipin IgA: <9 [APL'U]/mL (ref 0–11)
Anticardiolipin IgG: <9 [GPL'U]/mL (ref 0–14)
Anticardiolipin IgM: 31 [MPL'U]/mL — ABNORMAL HIGH (ref 0–12)

## 2023-05-12 DIAGNOSIS — C61 Malignant neoplasm of prostate: Secondary | ICD-10-CM | POA: Diagnosis not present

## 2023-05-12 DIAGNOSIS — E559 Vitamin D deficiency, unspecified: Secondary | ICD-10-CM | POA: Diagnosis not present

## 2023-05-12 DIAGNOSIS — M6281 Muscle weakness (generalized): Secondary | ICD-10-CM | POA: Diagnosis not present

## 2023-05-12 DIAGNOSIS — I81 Portal vein thrombosis: Secondary | ICD-10-CM | POA: Diagnosis not present

## 2023-05-12 DIAGNOSIS — J449 Chronic obstructive pulmonary disease, unspecified: Secondary | ICD-10-CM | POA: Diagnosis not present

## 2023-05-12 DIAGNOSIS — H4020X0 Unspecified primary angle-closure glaucoma, stage unspecified: Secondary | ICD-10-CM | POA: Diagnosis not present

## 2023-05-12 DIAGNOSIS — R279 Unspecified lack of coordination: Secondary | ICD-10-CM | POA: Diagnosis not present

## 2023-05-12 DIAGNOSIS — R262 Difficulty in walking, not elsewhere classified: Secondary | ICD-10-CM | POA: Diagnosis not present

## 2023-05-13 DIAGNOSIS — M6281 Muscle weakness (generalized): Secondary | ICD-10-CM | POA: Diagnosis not present

## 2023-05-13 DIAGNOSIS — J449 Chronic obstructive pulmonary disease, unspecified: Secondary | ICD-10-CM | POA: Diagnosis not present

## 2023-05-13 DIAGNOSIS — H4020X0 Unspecified primary angle-closure glaucoma, stage unspecified: Secondary | ICD-10-CM | POA: Diagnosis not present

## 2023-05-13 DIAGNOSIS — C61 Malignant neoplasm of prostate: Secondary | ICD-10-CM | POA: Diagnosis not present

## 2023-05-13 DIAGNOSIS — E559 Vitamin D deficiency, unspecified: Secondary | ICD-10-CM | POA: Diagnosis not present

## 2023-05-13 DIAGNOSIS — R262 Difficulty in walking, not elsewhere classified: Secondary | ICD-10-CM | POA: Diagnosis not present

## 2023-05-15 ENCOUNTER — Ambulatory Visit: Payer: Medicare HMO | Admitting: Urology

## 2023-05-15 ENCOUNTER — Encounter: Payer: Self-pay | Admitting: Urology

## 2023-05-15 VITALS — BP 104/63 | HR 97 | Ht 66.0 in | Wt 203.0 lb

## 2023-05-15 DIAGNOSIS — N401 Enlarged prostate with lower urinary tract symptoms: Secondary | ICD-10-CM

## 2023-05-15 DIAGNOSIS — C61 Malignant neoplasm of prostate: Secondary | ICD-10-CM

## 2023-05-15 DIAGNOSIS — N3040 Irradiation cystitis without hematuria: Secondary | ICD-10-CM

## 2023-05-15 DIAGNOSIS — Z8546 Personal history of malignant neoplasm of prostate: Secondary | ICD-10-CM | POA: Diagnosis not present

## 2023-05-15 DIAGNOSIS — N138 Other obstructive and reflux uropathy: Secondary | ICD-10-CM

## 2023-05-15 DIAGNOSIS — I81 Portal vein thrombosis: Secondary | ICD-10-CM | POA: Diagnosis not present

## 2023-05-15 DIAGNOSIS — N3941 Urge incontinence: Secondary | ICD-10-CM

## 2023-05-15 DIAGNOSIS — R972 Elevated prostate specific antigen [PSA]: Secondary | ICD-10-CM

## 2023-05-15 DIAGNOSIS — R279 Unspecified lack of coordination: Secondary | ICD-10-CM | POA: Diagnosis not present

## 2023-05-15 DIAGNOSIS — M6281 Muscle weakness (generalized): Secondary | ICD-10-CM | POA: Diagnosis not present

## 2023-05-15 DIAGNOSIS — J449 Chronic obstructive pulmonary disease, unspecified: Secondary | ICD-10-CM | POA: Diagnosis not present

## 2023-05-15 DIAGNOSIS — R351 Nocturia: Secondary | ICD-10-CM

## 2023-05-15 DIAGNOSIS — R262 Difficulty in walking, not elsewhere classified: Secondary | ICD-10-CM | POA: Diagnosis not present

## 2023-05-15 LAB — FACTOR 5 LEIDEN

## 2023-05-15 NOTE — Progress Notes (Unsigned)
Subjective:  1. Prostate cancer (HCC)   2. Elevated PSA   3. Radiation cystitis   4. BPH with urinary obstruction   5. Nocturia   6. Urge incontinence       05/15/23: Randy Russo returns today in f/u for his history below.  The last PSA I have is 10.5 from 01/17/23 which is up from 7.7 on 01/18/21.  It was 9.9 on 02/04/20 and 5.3 on 07/28/19. He had a CT [facility] on 04/22/23 that showed no evidence of prostate cancer mets.   He has pain in the left anterior thigh from his knee.   His IPSS is 1 with nocturia x 1.  He is incontinent. He is no longer on silodosin.     12/28/20: Randy Russo returns today for the history noted below.  He was last seen in 11/21 with a rising PSA and a prior history of prostate cancer as noted above.   His PSA on 12/13/20 with Dr. Phillips Odor was down slightly to 8.7.  He remains on silodosin for LUTS and his IPSS is only 4 with nocturia and frequency.  He has lost about 50lb over the last 6 months.   He has some pain in the right scrotum and has some swelling in the groin.  He saw Dr. Phillips Odor for that and was sent back over here.  Marland KitchenHe had a CT in 12/20 that showed an umbilical hernia but he had the beginnings of a right inguinal hernia as well.       02/11/20 : Randy Russo returns today in f/u for his history of prostate cancer with a rising PSA. He is 10+ years out from radiation therapy for T2b Gleason prostate cancer with a pretreatment PSA of 16.5. 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 His PSA is 9.9 prior to this visit which is up from 5.3 pm 07/28/19.  It was 6.1 a year ago.  He had a bone scan a year ago that was negative as was his CT scan.  He remains on Silodosin and is voiding well with an IPSS of 1-2 with nocturia 1-2x or more and can have urgency with his diuretic.  He has a good stream.  He has no hematuria or dysuria.  He has some pyuria and microhematuria.  He also has ED.  He has no associated signs or symptoms.         ROS:  ROS:  A complete  review of systems was performed.  All systems are negative except for pertinent findings as noted.   Review of Systems  Eyes:  Positive for double vision.  Musculoskeletal:  Positive for joint pain.  Neurological:  Positive for weakness and headaches.    No Known Allergies  Outpatient Encounter Medications as of 05/15/2023  Medication Sig Note   acetaminophen (TYLENOL) 650 MG CR tablet Take 650 mg by mouth every 4 (four) hours as needed for pain.    albuterol (PROVENTIL) (2.5 MG/3ML) 0.083% nebulizer solution Take 3 mLs (2.5 mg total) by nebulization every 6 (six) hours as needed for wheezing or shortness of breath.    apixaban (ELIQUIS) 5 MG TABS tablet Take 2 tablets (10 mg total) by mouth 2 (two) times daily for 6 days, THEN 1 tablet (5 mg total) 2 (two) times daily.    B Complex-C (B-COMPLEX WITH VITAMIN C) tablet Take 1 tablet by mouth daily.    Cholecalciferol 25 MCG (1000 UT) capsule Take 1 capsule (1,000 Units total) by mouth daily.  docusate sodium (COLACE) 100 MG capsule Take 2 capsules (200 mg total) by mouth daily.    ferrous sulfate 325 (65 FE) MG tablet Take 1 tablet (325 mg total) by mouth 2 (two) times daily with a meal.    furosemide (LASIX) 20 MG tablet Take 20 mg by mouth daily. 04/13/2023: Pt has Rx bottle at bedside. LF 01/23/23 #90, 90 DS.    latanoprost (XALATAN) 0.005 % ophthalmic solution Place 1 drop into both eyes at bedtime.    loratadine (CLARITIN) 10 MG tablet Take 1 tablet (10 mg total) by mouth daily.    LUMIGAN 0.01 % SOLN Place 1 drop into both eyes at bedtime.    SPIRIVA RESPIMAT 2.5 MCG/ACT AERS Inhale 2 puffs into the lungs daily.    No facility-administered encounter medications on file as of 05/15/2023.    Past Medical History:  Diagnosis Date   Arthritis    Cancer [facility])    Prostate   COPD (chronic obstructive pulmonary disease) (HCC)    Diverticulitis    Hypertension    Lower GI bleed 12/27/2017    Past Surgical History:  Procedure  Laterality Date   CATARACT EXTRACTION W/PHACO Left 11/16/2012   Procedure: CATARACT EXTRACTION PHACO AND INTRAOCULAR LENS PLACEMENT (IOC);  Surgeon: Gemma Payor, MD;  Location: [facility] ORS;  Service: Ophthalmology;  Laterality: Left;  CDE: 13.66   CATARACT EXTRACTION W/PHACO Right 01/18/2013   Procedure: CATARACT EXTRACTION PHACO AND INTRAOCULAR LENS PLACEMENT (IOC);  Surgeon: Gemma Payor, MD;  Location: [facility] ORS;  Service: Ophthalmology;  Laterality: Right;  CDE:8.79   CERVICAL FUSION  4 yrs ago   COLONOSCOPY N/A 12/28/2017   Procedure: COLONOSCOPY;  Surgeon: Malissa Hippo, MD;  Location: [facility] ENDO SUITE;  Service: Endoscopy;  Laterality: N/A;   COLONOSCOPY N/A 01/01/2018   Procedure: COLONOSCOPY;  Surgeon: Malissa Hippo, MD;  Location: [facility] END[facility] SU[facility];  Service: Endoscopy;  Laterality: N/A;   ESOPHAGOGASTRODUODENOSCOPY N/A 01/01/2018   Procedure: ESOPHAGOGASTRODUODENOSCOPY (EGD);  Surgeon: Malissa Hippo, MD;  Location: [facility];  Service: Endoscopy;  Laterality: N/A;   INGUINAL HERNIA REPAIR Right 03/12/2021   Procedure: HERNIA REPAIR INGUINAL ADULT;  Surgeon: Franky Macho, MD;  Location: [facility] OR[facility];  Service: General;  Laterality: Right;   JOINT REPLACEMENT Left 5 yrs ago   knee   PARTIAL COLECTOMY N/A 01/01/2018   Procedure: PARTIAL COLECTOMY;  Surgeon: Franky Macho, MD;  Location: [facility] ORS;  Service: General;  Laterality: N/A;   right hip replacement Right 3 yrs ago    Social History   Socioeconomic History   Marital status: Married    Spouse name: Not on file   Number of children: Not on file   Years of education: Not on file   Highest education level: Not on file  Occupational History   Not on file  Tobacco Use   Smoking status: Former    Types: Cigars   Smokeless tobacco: Never  Vaping Use   Vaping status: Never Used  Substance and Sexual Activity   Alcohol use: No   Drug use: No   Sexual activity: Not Currently  Other Topics Concern   Not on file  Social History  Narrative   Not on file   Social Drivers of Health   Financial Resource Strain: Low Risk  (11/30/2022)   Received from [facility]   Overall Financial Resource Strain (CARDIA)    Difficulty of Paying Living Expenses: Not very hard  Food Insecurity: No Food Insecurity (04/23/2023)  Hunger Vital Sign    Worried About Running Out of Food in the Last Year: Never true    Ran Out of Food in the Last Year: Never true  Recent Concern: Food Insecurity - Food Insecurity Present (04/22/2023)   Hunger Vital Sign    Worried About Running Out of Food in the Last Year: Never true    Ran Out of Food in the Last Year: Sometimes true  Transportation Needs: No Transportation Needs (04/23/2023)   PRAPARE - Administrator, Civil Service (Medical): No    Lack of Transportation (Non-Medical): No  Physical Activity: Unknown (12/27/2017)   Exercise Vital Sign    Days of Exercise per Week: Patient declined    Minutes of Exercise per Session: Patient declined  Stress: Not on file  Social Connections: Unknown (04/23/2023)   Social Connection and Isolation Panel [NHANES]    Frequency of Communication with Friends and Family: Once a week    Frequency of Social Gatherings with Friends and Family: Once a week    Attends Religious Services: 1 to 4 times per year    Active Member of [employer] or Organizations: No    Attends Banker Meetings: 1 to 4 times per year    Marital Status: Patient unable to answer  Intimate Partner Violence: Not At Risk (04/23/2023)   Humiliation, Afraid, Rape, and Kick questionnaire    Fear of Current or Ex-Partner: No    Emotionally Abused: No    Physically Abused: No    Sexually Abused: No    Family History  Problem Relation Age of Onset   Diabetes Mother    Dementia Father        Objective: Vitals:   05/15/23 1432  BP: 104/63  Pulse: 97      Physical Exam Vitals reviewed.  Constitutional:      Appearance: He is ill-appearing (in wheelchair).   Lymphadenopathy:     Upper Body:     Right upper body: No supraclavicular adenopathy.     Left upper body: No supraclavicular adenopathy.  Neurological:     Mental Status: He is alert.     Lab Results:  No results found for this or any previous visit (from the past 24 hours).    BMET No results for input(s): "NA", "K", "CL", "CO2", "GLUCOSE", "BUN", "CREATININE", "CALCIUM" in the last 72 hours. PSA PSA  Date Value Ref Range Status  07/28/2019 5.3 (H) < OR = 4.0 ng/mL Final    Comment:    The total PSA value from this assay system is  standardized against the WHO standard. The test  result will be approximately 20% lower when compared  to the equimolar-standardized total PSA (Beckman  Coulter). Comparison of serial PSA results should be  interpreted with this fact in mind. . This test was performed using the Siemens  chemiluminescent method. Values obtained from  different assay methods cannot be used interchangeably. PSA levels, regardless of value, should not be interpreted as absolute evidence of the presence or absence of disease.    No results found for: "TESTOSTERONE"  UA reviewed and PSA reviewed.   Studies/Results: Recent Results (from the past 2160 hours)  CBC with Differential     Status: Abnormal   Collection Time: 04/12/23  6:22 PM  Result Value Ref Range   WBC 6.7 4.0 - 10.5 K/uL   RBC 4.51 4.22 - 5.81 MIL/uL   Hemoglobin 11.8 (L) 13.0 - 17.0 g/dL   HCT 82.9 (L)  39.0 - 52.0 %   MCV 84.5 80.0 - 100.0 fL   MCH 26.2 26.0 - 34.0 pg   MCHC 31.0 30.0 - 36.0 g/dL   RDW 13.2 (H) 44.0 - 10.2 %   Platelets 255 150 - 400 K/uL   nRBC 0.0 0.0 - 0.2 %   Neutrophils Relative % 73 %   Neutro Abs 4.9 1.7 - 7.7 K/uL   Lymphocytes Relative 12 %   Lymphs Abs 0.8 0.7 - 4.0 K/uL   Monocytes Relative 13 %   Monocytes Absolute 0.9 0.1 - 1.0 K/uL   Eosinophils Relative 2 %   Eosinophils Absolute 0.1 0.0 - 0.5 K/uL   Basophils Relative 0 %   Basophils Absolute 0.0  0.0 - 0.1 K/uL   Immature Granulocytes 0 %   Abs Immature Granulocytes 0.02 0.00 - 0.07 K/uL    Comment: Performed at [facility], [address], [place], [place] [address]  Comprehensive metabolic panel     Status: Abnormal   Collection Time: 04/12/23  6:22 PM  Result Value Ref Range   Sodium 138 135 - 145 mmol/L   Potassium 3.9 3.5 - 5.1 mmol/L   Chloride 109 98 - 111 mmol/L   CO2 25 22 - 32 mmol/L   Glucose, Bld 103 (H) 70 - 99 mg/dL    Comment: Glucose reference range applies only to samples taken after fasting for at least 8 hours.   BUN 19 8 - 23 mg/dL   Creatinine, Ser 6.44 0.61 - 1.24 mg/dL   Calcium 9.2 8.9 - 03.4 mg/dL   Total Protein 7.1 6.5 - 8.1 g/dL   Albumin 3.2 (L) 3.5 - 5.0 g/dL   AST 15 15 - 41 U/L   ALT 16 0 - 44 U/L   Alkaline Phosphatase 60 38 - 126 U/L   Total Bilirubin 0.4 0.0 - 1.2 mg/dL   GFR, Estimated >74 >25 mL/min    Comment: (NOTE) Calculated using the CKD-EPI Creatinine Equation (2021)    Anion gap 4 (L) 5 - 15    Comment: Performed at [facility], [address] [place], [place] [address]  Brain natriuretic peptide     Status: None   Collection Time: 04/12/23  6:22 PM  Result Value Ref Range   B Natriuretic Peptide 58.0 0.0 - 100.0 pg/mL    Comment: Performed at [facility], [address] [place], [place] [address]  Urinalysis, Routine w reflex microscopic -Urine, Clean Catch     Status: Abnormal   Collection Time: 04/12/23  9:21 PM  Result Value Ref Range   Color, Urine YELLOW YELLOW   APPearance CLEAR CLEAR   Specific Gravity, Urine 1.015 1.005 - 1.030   pH 5.0 5.0 - 8.0   Glucose, UA NEGATIVE NEGATIVE mg/dL   Hgb urine dipstick SMALL (A) NEGATIVE   Bilirubin Urine NEGATIVE NEGATIVE   Ketones, ur NEGATIVE NEGATIVE mg/dL   Protein, ur NEGATIVE NEGATIVE mg/dL   Nitrite NEGATIVE NEGATIVE   Leukocytes,Ua NEGATIVE NEGATIVE   RBC / HPF 0-5 0 - 5 RBC/hpf   WBC, UA 0-5 0 - 5 WBC/hpf   Bacteria, UA RARE (A) NONE SEEN   Squamous Epithelial  / HPF 0-5 0 - 5 /HPF   Mucus PRESENT     Comment: Performed at [facility], [address], [place], [place] [address]  SARS Coronavirus 2 by RT PCR (hospital order, performed in [facility] hospital lab) *cepheid single result test* Anterior Nasal Swab     Status:  None   Collection Time: 04/12/23  9:35 PM   Specimen: Anterior Nasal Swab  Result Value Ref Range   SARS Coronavirus 2 by RT PCR NEGATIVE NEGATIVE    Comment: (NOTE) SARS-CoV-2 target nucleic acids are NOT DETECTED.  The SARS-CoV-2 RNA is generally detectable in upper and lower respiratory specimens during the acute phase of infection. The lowest concentration of SARS-CoV-2 viral copies this assay can detect is 250 copies / mL. A negative result does not preclude SARS-CoV-2 infection and should not be used as the sole basis for treatment or other patient management decisions.  A negative result may occur with improper specimen collection / handling, submission of specimen other than nasopharyngeal swab, presence of viral mutation(s) within the areas targeted by this assay, and inadequate number of viral copies (<250 copies / mL). A negative result must be combined with clinical observations, patient history, and epidemiological information.  Fact Sheet for Patients:   RoadLapTop.co.za  Fact Sheet for Healthcare Providers: http://kim-miller.com/  This test is not yet approved or  cleared by the [place] FDA and has been authorized for detection and/or diagnosis of SARS-CoV-2 by FDA under an Emergency Use Authorization (EUA).  This EUA will remain in effect (meaning this test can be used) for the duration of the COVID-19 declaration under Section 564(b)(1) of the Act, 21 U.S.C. section 360bbb-3(b)(1), unless the authorization is terminated or revoked sooner.  Performed at [facility], [address], [place], [place] [address]   Basic metabolic panel     Status: None    Collection Time: 04/13/23  5:24 AM  Result Value Ref Range   Sodium 138 135 - 145 mmol/L   Potassium 3.8 3.5 - 5.1 mmol/L   Chloride 108 98 - 111 mmol/L   CO2 24 22 - 32 mmol/L   Glucose, Bld 93 70 - 99 mg/dL    Comment: Glucose reference range applies only to samples taken after fasting for at least 8 hours.   BUN 20 8 - 23 mg/dL   Creatinine, Ser 9.14 0.61 - 1.24 mg/dL   Calcium 9.2 8.9 - 78.2 mg/dL   GFR, Estimated >95 >62 mL/min    Comment: (NOTE) Calculated using the CKD-EPI Creatinine Equation (2021)    Anion gap 6 5 - 15    Comment: Performed at [facility], [address], [place], [place] [address]  CBC     Status: Abnormal   Collection Time: 04/13/23  5:24 AM  Result Value Ref Range   WBC 6.3 4.0 - 10.5 K/uL   RBC 4.59 4.22 - 5.81 MIL/uL   Hemoglobin 11.8 (L) 13.0 - 17.0 g/dL   HCT 57.8 (L) 46.9 - 62.9 %   MCV 84.1 80.0 - 100.0 fL   MCH 25.7 (L) 26.0 - 34.0 pg   MCHC 30.6 30.0 - 36.0 g/dL   RDW 52.8 (H) 41.3 - 24.4 %   Platelets 269 150 - 400 K/uL   nRBC 0.0 0.0 - 0.2 %    Comment: Performed at [facility], [address] [place], [place] [address]  Resp panel by RT-PCR (RSV, Flu A&B, Covid) Anterior Nasal Swab     Status: None   Collection Time: 04/22/23 12:44 PM   Specimen: Anterior Nasal Swab  Result Value Ref Range   SARS Coronavirus 2 by RT PCR NEGATIVE NEGATIVE    Comment: (NOTE) SARS-CoV-2 target nucleic acids are NOT DETECTED.  The SARS-CoV-2 RNA is generally detectable in upper respiratory specimens during the acute phase of  infection. The lowest concentration of SARS-CoV-2 viral copies this assay can detect is 138 copies/mL. A negative result does not preclude SARS-Cov-2 infection and should not be used as the sole basis for treatment or other patient management decisions. A negative result may occur with  improper specimen collection/handling, submission of specimen other than nasopharyngeal swab, presence of viral mutation(s) within the areas  targeted by this assay, and inadequate number of viral copies(<138 copies/mL). A negative result must be combined with clinical observations, patient history, and epidemiological information. The expected result is Negative.  Fact Sheet for Patients:  BloggerCourse.com  Fact Sheet for Healthcare Providers:  SeriousBroker.it  This test is no t yet approved or cleared by the [place] FDA and  has been authorized for detection and/or diagnosis of SARS-CoV-2 by FDA under an Emergency Use Authorization (EUA). This EUA will remain  in effect (meaning this test can be used) for the duration of the COVID-19 declaration under Section 564(b)(1) of the Act, 21 U.S.C.section 360bbb-3(b)(1), unless the authorization is terminated  or revoked sooner.       Influenza A by PCR NEGATIVE NEGATIVE   Influenza B by PCR NEGATIVE NEGATIVE    Comment: (NOTE) The Xpert Xpress SARS-CoV-2/FLU/RSV plus assay is intended as an aid in the diagnosis of influenza from Nasopharyngeal swab specimens and should not be used as a sole basis for treatment. Nasal washings and aspirates are unacceptable for Xpert Xpress SARS-CoV-2/FLU/RSV testing.  Fact Sheet for Patients: BloggerCourse.com  Fact Sheet for Healthcare Providers: SeriousBroker.it  This test is not yet approved or cleared by the [place] FDA and has been authorized for detection and/or diagnosis of SARS-CoV-2 by FDA under an Emergency Use Authorization (EUA). This EUA will remain in effect (meaning this test can be used) for the duration of the COVID-19 declaration under Section 564(b)(1) of the Act, 21 U.S.C. section 360bbb-3(b)(1), unless the authorization is terminated or revoked.     Resp Syncytial Virus by PCR NEGATIVE NEGATIVE    Comment: (NOTE) Fact Sheet for Patients: BloggerCourse.com  Fact Sheet for  Healthcare Providers: SeriousBroker.it  This test is not yet approved or cleared by the [place] FDA and has been authorized for detection and/or diagnosis of SARS-CoV-2 by FDA under an Emergency Use Authorization (EUA). This EUA will remain in effect (meaning this test can be used) for the duration of the COVID-19 declaration under Section 564(b)(1) of the Act, 21 U.S.C. section 360bbb-3(b)(1), unless the authorization is terminated or revoked.  Performed at [facility], [address] [place], [place] [address]   Lactic acid, plasma     Status: None   Collection Time: 04/22/23  2:23 PM  Result Value Ref Range   Lactic Acid, Venous 1.8 0.5 - 1.9 mmol/L    Comment: Performed at [facility], [address], [place], [place] [address]  Blood culture (routine x 2)     Status: None   Collection Time: 04/22/23  2:23 PM   Specimen: BLOOD  Result Value Ref Range   Specimen Description BLOOD LEFT ANTECUBITAL    Special Requests      BOTTLES DRAWN AEROBIC AND ANAEROBIC Blood Culture results may not be optimal due to an inadequate volume of blood received in culture bottles   Culture      NO GROWTH 5 DAYS Performed at [facility], [address] [place], [place] [address]    Report Status 04/27/2023 FINAL   Sedimentation rate     Status: Abnormal   Collection Time:  04/22/23  2:38 PM  Result Value Ref Range   Sed Rate 80 (H) 0 - 16 mm/hr    Comment: Performed at [facility], [address] [place], [place] [address]  CBC     Status: Abnormal   Collection Time: 04/22/23  2:39 PM  Result Value Ref Range   WBC 13.2 (H) 4.0 - 10.5 K/uL   RBC 4.09 (L) 4.22 - 5.81 MIL/uL   Hemoglobin 10.6 (L) 13.0 - 17.0 g/dL   HCT 60.4 (L) 54.0 - 98.1 %   MCV 82.2 80.0 - 100.0 fL   MCH 25.9 (L) 26.0 - 34.0 pg   MCHC 31.5 30.0 - 36.0 g/dL   RDW 19.1 (H) 47.8 - 29.5 %   Platelets 231 150 - 400 K/uL   nRBC 0.0 0.0 - 0.2 %    Comment: Performed at [facility], [address], [place], [place] [address]  Comprehensive metabolic panel     Status: Abnormal   Collection Time: 04/22/23  2:39 PM  Result Value Ref Range   Sodium 135 135 - 145 mmol/L   Potassium 3.3 (L) 3.5 - 5.1 mmol/L   Chloride 103 98 - 111 mmol/L   CO2 21 (L) 22 - 32 mmol/L   Glucose, Bld 125 (H) 70 - 99 mg/dL    Comment: Glucose reference range applies only to samples taken after fasting for at least 8 hours.   BUN 22 8 - 23 mg/dL   Creatinine, Ser 8.65 0.61 - 1.24 mg/dL   Calcium 8.6 (L) 8.9 - 10.3 mg/dL   Total Protein 7.2 6.5 - 8.1 g/dL   Albumin 2.4 (L) 3.5 - 5.0 g/dL   AST 784 (H) 15 - 41 U/L   ALT 177 (H) 0 - 44 U/L   Alkaline Phosphatase 84 38 - 126 U/L   Total Bilirubin 2.5 (H) 0.0 - 1.2 mg/dL   GFR, Estimated >69 >62 mL/min    Comment: (NOTE) Calculated using the CKD-EPI Creatinine Equation (2021)    Anion gap 11 5 - 15    Comment: Performed at [facility], [address] [place], [place] [address]  Blood culture (routine x 2)     Status: None   Collection Time: 04/22/23  2:39 PM   Specimen: BLOOD  Result Value Ref Range   Specimen Description BLOOD BLOOD RIGHT FOREARM    Special Requests      BOTTLES DRAWN AEROBIC AND ANAEROBIC Blood Culture adequate volume   Culture      NO GROWTH 5 DAYS Performed at [facility], [address], [place], [place] [address]    Report Status 04/27/2023 FINAL   Acetaminophen level     Status: Abnormal   Collection Time: 04/22/23  2:39 PM  Result Value Ref Range   Acetaminophen (Tylenol), Serum 39 (H) 10 - 30 ug/mL    Comment: (NOTE) Therapeutic concentrations vary significantly. A range of 10-30 ug/mL  may be an effective concentration for many patients. However, some  are best treated at concentrations outside of this range. Acetaminophen concentrations >150 ug/mL at 4 hours after ingestion  and >50 ug/mL at 12 hours after ingestion are often associated with  toxic reactions.  Performed at [facility], [address]  [place], [place] [address]   Lactic acid, plasma     Status: None   Collection Time: 04/22/23  4:54 PM  Result Value Ref Range   Lactic Acid, Venous 1.0 0.5 - 1.9 mmol/L    Comment: Performed at  [facility], [address] [place], [place] [address]  Urinalysis, Routine w reflex microscopic -Urine, Clean Catch     Status: Abnormal   Collection Time: 04/22/23  5:24 PM  Result Value Ref Range   Color, Urine AMBER (A) YELLOW    Comment: BIOCHEMICALS MAY BE AFFECTED BY COLOR   APPearance HAZY (A) CLEAR   Specific Gravity, Urine 1.020 1.005 - 1.030   pH 5.0 5.0 - 8.0   Glucose, UA NEGATIVE NEGATIVE mg/dL   Hgb urine dipstick SMALL (A) NEGATIVE   Bilirubin Urine NEGATIVE NEGATIVE   Ketones, ur NEGATIVE NEGATIVE mg/dL   Protein, ur 30 (A) NEGATIVE mg/dL   Nitrite NEGATIVE NEGATIVE   Leukocytes,Ua NEGATIVE NEGATIVE   RBC / HPF 6-10 0 - 5 RBC/hpf   WBC, UA 0-5 0 - 5 WBC/hpf   Bacteria, UA RARE (A) NONE SEEN   Squamous Epithelial / HPF 0-5 0 - 5 /HPF   Mucus PRESENT    Hyaline Casts, UA PRESENT     Comment: Performed at [facility], [address] [place], [place] [address]  Hepatitis panel, acute     Status: None   Collection Time: 04/22/23  6:35 PM  Result Value Ref Range   Hepatitis B Surface Ag NON REACTIVE NON REACTIVE   HCV Ab NON REACTIVE NON REACTIVE    Comment: (NOTE) Nonreactive HCV antibody screen is consistent with no HCV infections,  unless recent infection is suspected or other evidence exists to indicate HCV infection.     Hep A IgM NON REACTIVE NON REACTIVE   Hep B C IgM NON REACTIVE NON REACTIVE    Comment: Performed at [facility], [address], [place], [place] [address]  C-reactive protein     Status: Abnormal   Collection Time: 04/22/23  6:35 PM  Result Value Ref Range   CRP 27.6 (H) <1.0 mg/dL    Comment: Performed at [facility], [address] [place], [place] [address]  Protime-INR     Status: Abnormal   Collection Time: 04/22/23  7:48 PM   Result Value Ref Range   Prothrombin Time 15.7 (H) 11.4 - 15.2 seconds   INR 1.2 0.8 - 1.2    Comment: (NOTE) INR goal varies based on device and disease states. Performed at [facility], [address], [place], [place] [address]   APTT     Status: None   Collection Time: 04/22/23  7:48 PM  Result Value Ref Range   aPTT 27 24 - 36 seconds    Comment: Performed at [facility], [address] [place], [place] [address]  CBC     Status: Abnormal   Collection Time: 04/23/23  4:41 AM  Result Value Ref Range   WBC 12.1 (H) 4.0 - 10.5 K/uL   RBC 3.49 (L) 4.22 - 5.81 MIL/uL   Hemoglobin 9.3 (L) 13.0 - 17.0 g/dL   HCT 57.8 (L) 46.9 - 62.9 %   MCV 82.8 80.0 - 100.0 fL   MCH 26.6 26.0 - 34.0 pg   MCHC 32.2 30.0 - 36.0 g/dL   RDW 52.8 (H) 41.3 - 24.4 %   Platelets 204 150 - 400 K/uL   nRBC 0.0 0.0 - 0.2 %    Comment: Performed at [facility], [address] [place], [place] [address]  Heparin level (unfractionated)     Status: Abnormal   Collection Time: 04/23/23  4:41 AM  Result Value Ref Range   Heparin Unfractionated <0.10 (L)  0.30 - 0.70 IU/mL    Comment: (NOTE) The clinical reportable range upper limit is being lowered to >1.10 to align with the FDA approved guidance for the current laboratory assay.  If heparin results are below expected values, and patient dosage has  been confirmed, suggest follow up testing of antithrombin III levels. Performed at [facility], [address] [place], [place] [address]   Comprehensive metabolic panel     Status: Abnormal   Collection Time: 04/23/23  4:41 AM  Result Value Ref Range   Sodium 137 135 - 145 mmol/L   Potassium 3.4 (L) 3.5 - 5.1 mmol/L   Chloride 108 98 - 111 mmol/L   CO2 20 (L) 22 - 32 mmol/L   Glucose, Bld 101 (H) 70 - 99 mg/dL    Comment: Glucose reference range applies only to samples taken after fasting for at least 8 hours.   BUN 23 8 - 23 mg/dL   Creatinine, Ser 9.14 0.61 - 1.24 mg/dL   Calcium 8.2 (L) 8.9 - 10.3  mg/dL   Total Protein 6.1 (L) 6.5 - 8.1 g/dL   Albumin 2.1 (L) 3.5 - 5.0 g/dL   AST 782 (H) 15 - 41 U/L   ALT 139 (H) 0 - 44 U/L   Alkaline Phosphatase 75 38 - 126 U/L   Total Bilirubin 2.4 (H) 0.0 - 1.2 mg/dL   GFR, Estimated >95 >62 mL/min    Comment: (NOTE) Calculated using the CKD-EPI Creatinine Equation (2021)    Anion gap 9 5 - 15    Comment: Performed at [facility], [address], [place], [place] [address]  Heparin level (unfractionated)     Status: Abnormal   Collection Time: 04/23/23  2:52 PM  Result Value Ref Range   Heparin Unfractionated <0.10 (L) 0.30 - 0.70 IU/mL    Comment: (NOTE) The clinical reportable range upper limit is being lowered to >1.10 to align with the FDA approved guidance for the current laboratory assay.  If heparin results are below expected values, and patient dosage has  been confirmed, suggest follow up testing of antithrombin III levels. Performed at [facility], [address] [place], [place] [address]   Hepatic function panel     Status: Abnormal   Collection Time: 04/23/23  3:23 PM  Result Value Ref Range   Total Protein 6.8 6.5 - 8.1 g/dL   Albumin 2.3 (L) 3.5 - 5.0 g/dL   AST 962 (H) 15 - 41 U/L   ALT 146 (H) 0 - 44 U/L   Alkaline Phosphatase 85 38 - 126 U/L   Total Bilirubin 1.9 (H) 0.0 - 1.2 mg/dL   Bilirubin, Direct 1.0 (H) 0.0 - 0.2 mg/dL   Indirect Bilirubin 0.9 0.3 - 0.9 mg/dL    Comment: Performed at [facility], [address], [place], [place] [address]  Protime-INR     Status: None   Collection Time: 04/23/23  3:23 PM  Result Value Ref Range   Prothrombin Time 15.2 11.4 - 15.2 seconds   INR 1.2 0.8 - 1.2    Comment: (NOTE) INR goal varies based on device and disease states. Performed at [facility], [address], [place], [place] [address]   Heparin level (unfractionated)     Status: Abnormal   Collection Time: 04/24/23  1:57 AM  Result Value Ref Range   Heparin Unfractionated <0.10 (L) 0.30 - 0.70 IU/mL     Comment: (NOTE) The clinical reportable range upper limit is being lowered to >1.10  to align with the FDA approved guidance for the current laboratory assay.  If heparin results are below expected values, and patient dosage has  been confirmed, suggest follow up testing of antithrombin III levels. Performed at [facility], [address] [place], [place] [address]   CBC     Status: Abnormal   Collection Time: 04/24/23  1:57 AM  Result Value Ref Range   WBC 12.7 (H) 4.0 - 10.5 K/uL   RBC 3.58 (L) 4.22 - 5.81 MIL/uL   Hemoglobin 9.5 (L) 13.0 - 17.0 g/dL   HCT 60.4 (L) 54.0 - 98.1 %   MCV 83.0 80.0 - 100.0 fL   MCH 26.5 26.0 - 34.0 pg   MCHC 32.0 30.0 - 36.0 g/dL   RDW 19.1 (H) 47.8 - 29.5 %   Platelets 229 150 - 400 K/uL   nRBC 0.0 0.0 - 0.2 %    Comment: Performed at [facility], [address] [place], [place] [address]  Lupus anticoagulant panel     Status: Abnormal   Collection Time: 04/24/23  9:34 AM  Result Value Ref Range   PTT Lupus Anticoagulant 45.3 (H) 0.0 - 43.5 sec   DRVVT 41.0 0.0 - 47.0 sec   Lupus Anticoag Interp Comment:     Comment: (NOTE) No lupus anticoagulant was detected. Results suggest the presence of an inhibitor.  The presence of heparin, which is a non-specific inhibitor, may cause this pattern of results. Since the PTT-LA was extended and the dRVVT was within normal limits, a specific inhibitor to factor VIII, IX, XI, or XII cannot be excluded. It should be noted that mixing studies performed on samples with minimally extended PTT-LA results can be equivocal.  Normal plasma can overcome weak inhibitors, also resulting in a correction of the mixing study. As antibody titers may fluctuate with time, repeat testing may be indicated and ideally should be performed in the absence of anticoagulant therapy. Performed At: [facility] [address] [place], [place] [id number] Jolene Schimke MD EX:[id number]   Cardiolipin antibodies,  IgG, IgM, IgA     Status: Abnormal   Collection Time: 04/24/23  9:34 AM  Result Value Ref Range   Anticardiolipin IgG <9 0 - 14 GPL U/mL    Comment: (NOTE)                          Negative:              <15                          Indeterminate:     15 - 20                          Low-Med Positive: >20 - 80                          High Positive:         >80    Anticardiolipin IgM 30 (H) 0 - 12 MPL U/mL    Comment: (NOTE)                          Negative:              <13  Indeterminate:     13 - 20                          Low-Med Positive: >20 - 80                          High Positive:         >80    Anticardiolipin IgA <9 0 - 11 APL U/mL    Comment: (NOTE)                          Negative:              <12                          Indeterminate:     12 - 20                          Low-Med Positive: >20 - 80                          High Positive:         >80 Performed At: [facility] [place] [address] [place], [place] [id number] Jolene Schimke MD WU:[id number]   Beta-2-glycoprotein i abs, IgG/M/A     Status: None   Collection Time: 04/24/23  9:34 AM  Result Value Ref Range   Beta-2 Glyco I IgG <9 0 - 20 GPI IgG units    Comment: (NOTE) The reference interval reflects a 3SD or 99th percentile interval, which is thought to represent a potentially clinically significant result in accordance with the International Consensus Statement on the classification criteria for definitive antiphospholipid syndrome (APS). J Thromb Haem 2006;4:295-306.    Beta-2-Glycoprotein I IgM <9 0 - 32 GPI IgM units    Comment: (NOTE) The reference interval reflects a 3SD or 99th percentile interval, which is thought to represent a potentially clinically significant result in accordance with the International Consensus Statement on the classification criteria for definitive antiphospholipid syndrome (APS). J Thromb Haem 2006;4:295-306. Performed At: [facility] [address] [place], [place] [id number] Jolene Schimke MD VH:[id number]    Beta-2-Glycoprotein I IgA <9 0 - 25 GPI IgA units    Comment: (NOTE) The reference interval reflects a 3SD or 99th percentile interval, which is thought to represent a potentially clinically significant result in accordance with the International Consensus Statement on the classification criteria for definitive antiphospholipid syndrome (APS). J Thromb Haem 2006;4:295-306.   Ferritin     Status: None   Collection Time: 04/24/23  9:34 AM  Result Value Ref Range   Ferritin 300 24 - 336 ng/mL    Comment: Performed at [facility], [address] [place], [place] [address]  Iron and TIBC     Status: Abnormal   Collection Time: 04/24/23  9:34 AM  Result Value Ref Range   Iron 19 (L) 45 - 182 ug/dL   TIBC 401 (L) 027 - 253 ug/dL   Saturation Ratios 11 (L) 17.9 - 39.5 %   UIBC 151 ug/dL    Comment: Performed at [facility], [address] [place], [place] [address]  Vitamin B12     Status: Abnormal   Collection Time: 04/24/23  9:34 AM  Result Value Ref Range   Vitamin  B-12 966 (H) 180 - 914 pg/mL    Comment: (NOTE) This assay is not validated for testing neonatal or myeloproliferative syndrome specimens for Vitamin B12 levels. Performed at [facility], [address] [place], [place] [address]   Folate     Status: None   Collection Time: 04/24/23  9:34 AM  Result Value Ref Range   Folate 16.9 >5.9 ng/mL    Comment: Performed at [facility], [address], [place], [place] [address]  Methylmalonic acid, serum     Status: None   Collection Time: 04/24/23  9:34 AM  Result Value Ref Range   Methylmalonic Acid, Quantitative 269 0 - 378 nmol/L    Comment: (NOTE) This test was developed and its performance characteristics determined by [facility]. It has not been cleared or approved by the Food and Drug Administration. Performed At: [facility] [address] [place], [place]  [id number] Jolene Schimke MD [id number]:[id number]   Copper, serum     Status: Abnormal   Collection Time: 04/24/23  9:34 AM  Result Value Ref Range   Copper 159 (H) 69 - 132 ug/dL    Comment: (NOTE) This test was developed and its performance characteristics determined by [facility]. It has not been cleared or approved by the Food and Drug Administration.                                Detection Limit = 5 Performed At: [facility] [place] [address] [place], [place] [id number] Jolene Schimke MD CW:[id number]   Lactate dehydrogenase     Status: None   Collection Time: 04/24/23  9:34 AM  Result Value Ref Range   LDH 142 98 - 192 U/L    Comment: Performed at [facility], [address], [place], [place] [address]  Retic Panel     Status: Abnormal   Collection Time: 04/24/23  9:34 AM  Result Value Ref Range   Retic Ct Pct 0.7 0.4 - 3.1 %   RBC. 3.71 (L) 4.22 - 5.81 MIL/uL   Retic Count, Absolute 27.1 19.0 - 186.0 K/uL   Immature Retic Fract 17.3 (H) 2.3 - 15.9 %   Reticulocyte Hemoglobin 23.7 (L) >27.9 pg    Comment:        A RET-He < 28 pg is an indication of iron-deficient or iron- insufficient erythropoiesis. Patients with thalassemia may also have a decreased RET-He result unrelated to iron availability.     If this patient has chronic kidney disease and does not have a hemoglobinopathy he/she meets criteria for iron deficiency per the 2016 NICE guidelines. Refer to specific guidelines to determine the appropriate thresholds for treating CKD- associated iron deficiency. TSAT and ferritin should be used in patients with hemoglobinopathies (e.g. thalassemia). Performed at [facility], [address] [place], [place] [address]   Factor V Leiden     Status: None   Collection Time: 04/24/23  9:34 AM  Result Value Ref Range   Factor V Leiden Result WRORD     Comment: (NOTE) Test not performed. The required specimen for the test ordered was not received. No lavender  received. Notified Joellyn Haff.   PTT-LA Mix     Status: Abnormal   Collection Time: 04/24/23  9:34 AM  Result Value Ref Range   PTT-LA Mix 44.0 (H) 0.0 - 40.5 sec    Comment: (NOTE) Performed At: [facility] [address] [place], [place] [id number] Jolene Schimke  MD [id number]   Hexagonal Phase Phospholipid     Status: None   Collection Time: 04/24/23  9:34 AM  Result Value Ref Range   Hexagonal Phase Phospholipid 10 0 - 11 sec    Comment: (NOTE) Performed At: [facility] [place] [address] [place], [place] [id number] Jolene Schimke MD [id number]   CBC     Status: Abnormal   Collection Time: 04/25/23  5:06 AM  Result Value Ref Range   WBC 13.0 (H) 4.0 - 10.5 K/uL   RBC 3.60 (L) 4.22 - 5.81 MIL/uL   Hemoglobin 9.2 (L) 13.0 - 17.0 g/dL   HCT 56.4 (L) 33.2 - 95.1 %   MCV 83.6 80.0 - 100.0 fL   MCH 25.6 (L) 26.0 - 34.0 pg   MCHC 30.6 30.0 - 36.0 g/dL   RDW 88.4 (H) 16.6 - 06.3 %   Platelets 299 150 - 400 K/uL   nRBC 0.2 0.0 - 0.2 %    Comment: Performed at [facility], [address] [place], [place] [address]  Comprehensive metabolic panel     Status: Abnormal   Collection Time: 04/25/23  5:06 AM  Result Value Ref Range   Sodium 139 135 - 145 mmol/L   Potassium 3.5 3.5 - 5.1 mmol/L   Chloride 108 98 - 111 mmol/L   CO2 23 22 - 32 mmol/L   Glucose, Bld 102 (H) 70 - 99 mg/dL    Comment: Glucose reference range applies only to samples taken after fasting for at least 8 hours.   BUN 13 8 - 23 mg/dL   Creatinine, Ser 0.93 0.61 - 1.24 mg/dL   Calcium 8.2 (L) 8.9 - 10.3 mg/dL   Total Protein 5.9 (L) 6.5 - 8.1 g/dL   Albumin 2.0 (L) 3.5 - 5.0 g/dL   AST 31 15 - 41 U/L   ALT 87 (H) 0 - 44 U/L   Alkaline Phosphatase 79 38 - 126 U/L   Total Bilirubin 1.2 0.0 - 1.2 mg/dL   GFR, Estimated >23 >55 mL/min    Comment: (NOTE) Calculated using the CKD-EPI Creatinine Equation (2021)    Anion gap 8 5 - 15    Comment: Performed at [facility], [address] [place], [place] [address]  ANA w/Reflex if Positive     Status: None   Collection Time: 04/25/23  5:06 AM  Result Value Ref Range   Anti Nuclear Antibody (ANA) Negative Negative    Comment: (NOTE) Performed At: [facility] [place] [address] [place], [place] [id number] Jolene Schimke MD JS:[id number]   Mitochondrial antibodies     Status: None   Collection Time: 04/25/23  5:06 AM  Result Value Ref Range   Mitochondrial M2 Ab, IgG <20.0 0.0 - 20.0 Units    Comment: (NOTE)                                Negative    0.0 - 20.0                                Equivocal  20.1 - 24.9                                Positive         >24.9 Mitochondrial (M2) Antibodies are found in 90-96%  of patients with primary biliary cirrhosis. Performed At: [facility] [address] [place], [place] [id number] Jolene Schimke MD XB:[id number]   Anti-smooth muscle antibody, IgG     Status: Abnormal   Collection Time: 04/25/23  5:06 AM  Result Value Ref Range   F-Actin IgG 24 (H) 0 - 19 Units    Comment: (NOTE)                 Negative                     0 - 19                 Weak positive               20 - 30                 Moderate to strong positive     >30 Actin Antibodies are found in 52-85% of patients with autoimmune hepatitis or chronic active hepatitis and in 22% of patients with primary biliary cirrhosis. Performed At: [facility] [address] [place], [place] [id number] Jolene Schimke MD [id number]   IgG     Status: None   Collection Time: 04/25/23  5:06 AM  Result Value Ref Range   IgG (Immunoglobin G), Serum 1,330 603 - 1,613 mg/dL    Comment: (NOTE) Performed At: [facility] [address] [place], [place] [id number] Jolene Schimke MD [id number]   CBC with Differential/Platelet     Status: Abnormal   Collection Time: 05/08/23 10:12 AM  Result Value Ref Range   WBC 5.8 4.0 - 10.5 K/uL   RBC 3.99 (L) 4.22 - 5.81 MIL/uL    Hemoglobin 10.8 (L) 13.0 - 17.0 g/dL   HCT 56.3 (L) 87.5 - 64.3 %   MCV 89.0 80.0 - 100.0 fL   MCH 27.1 26.0 - 34.0 pg   MCHC 30.4 30.0 - 36.0 g/dL   RDW 32.9 (H) 51.8 - 84.1 %   Platelets 365 150 - 400 K/uL   nRBC 0.0 0.0 - 0.2 %   Neutrophils Relative % 72 %   Neutro Abs 4.2 1.7 - 7.7 K/uL   Lymphocytes Relative 16 %   Lymphs Abs 0.9 0.7 - 4.0 K/uL   Monocytes Relative 8 %   Monocytes Absolute 0.5 0.1 - 1.0 K/uL   Eosinophils Relative 2 %   Eosinophils Absolute 0.1 0.0 - 0.5 K/uL   Basophils Relative 1 %   Basophils Absolute 0.1 0.0 - 0.1 K/uL   WBC Morphology MORPHOLOGY UNREMARKABLE    RBC Morphology See Note     Comment: ANISOCYTOSIS   Smear Review PLATELETS APPEAR ADEQUATE     Comment: PLATELET COUNT CONFIRMED BY SMEAR   Immature Granulocytes 1 %   Abs Immature Granulocytes 0.03 0.00 - 0.07 K/uL   Polychromasia PRESENT    Ovalocytes PRESENT     Comment: Performed at [facility], [address], [place], [place] [address]  Comprehensive metabolic panel     Status: Abnormal   Collection Time: 05/08/23 10:12 AM  Result Value Ref Range   Sodium 140 135 - 145 mmol/L   Potassium 3.8 3.5 - 5.1 mmol/L   Chloride 108 98 - 111 mmol/L   CO2 25 22 - 32 mmol/L   Glucose, Bld 93 70 - 99 mg/dL    Comment: Glucose reference range applies only to samples taken after fasting for at least 8 hours.   BUN 15  8 - 23 mg/dL   Creatinine, Ser 6.44 0.61 - 1.24 mg/dL   Calcium 9.2 8.9 - 03.4 mg/dL   Total Protein 7.4 6.5 - 8.1 g/dL   Albumin 3.0 (L) 3.5 - 5.0 g/dL   AST 24 15 - 41 U/L   ALT 38 0 - 44 U/L   Alkaline Phosphatase 82 38 - 126 U/L   Total Bilirubin 0.8 0.0 - 1.2 mg/dL   GFR, Estimated >74 >25 mL/min    Comment: (NOTE) Calculated using the CKD-EPI Creatinine Equation (2021)    Anion gap 7 5 - 15    Comment: Performed at [facility], [address], [place], [place] [address]  Beta-2-glycoprotein i abs, IgG/M/A     Status: None   Collection Time: 05/08/23 10:12 AM  Result  Value Ref Range   Beta-2 Glyco I IgG <9 0 - 20 GPI IgG units    Comment: (NOTE) The reference interval reflects a 3SD or 99th percentile interval, which is thought to represent a potentially clinically significant result in accordance with the International Consensus Statement on the classification criteria for definitive antiphospholipid syndrome (APS). J Thromb Haem 2006;4:295-306.    Beta-2-Glycoprotein I IgM <9 0 - 32 GPI IgM units    Comment: (NOTE) The reference interval reflects a 3SD or 99th percentile interval, which is thought to represent a potentially clinically significant result in accordance with the International Consensus Statement on the classification criteria for definitive antiphospholipid syndrome (APS). J Thromb Haem 2006;4:295-306. Performed At: [facility] [address] [place], [place] [id number] Jolene Schimke MD [id number]    Beta-2-Glycoprotein I IgA 12 0 - 25 GPI IgA units    Comment: (NOTE) The reference interval reflects a 3SD or 99th percentile interval, which is thought to represent a potentially clinically significant result in accordance with the International Consensus Statement on the classification criteria for definitive antiphospholipid syndrome (APS). J Thromb Haem 2006;4:295-306.   Factor 5 leiden     Status: None   Collection Time: 05/08/23 10:12 AM  Result Value Ref Range   Recommendations-F5LEID: Comment     Comment: (NOTE) Result: c.1601G>A (p.Arg534Gln) - Not Detected This result is not associated with an increased risk for venous thromboembolism. See Additional Clinical Information and Comments. Additional Clinical Information: Venous thromboembolism is a multifactorial disease influenced by genetic, environmental, and circumstantial risk factors. The c.1601G>A (p. Arg534Gln) variant in the F5 gene, commonly referred to as Factor V Leiden, is a genetic risk factor for venous thromboembolism. Heterozygous carriers  of this variant have a 6- to 8- fold increased risk for venous thromboembolism. Individuals homozygous for this variant (ie, with a copy of the variant on each chromosome) have an approximately 80-fold increased risk for venous thromboembolism. Individuals who carry both a c.*97G>A variant in the F2 gene and Factor V Leiden have an approximately 20-fold increased risk for venous thromboembolism. Risks are likely to be even higher in more complex genotype combinations in volving the F2 c.*97G>A variant and Factor V Leiden (PMID: 60109323). Additional risk factors include but are not limited to: deficiency of protein C, protein S, or antithrombin III, age, male sex, personal or family history of deep vein thromboembolism, smoking, surgery, prolonged immobilization, malignant neoplasm, tamoxifen treatment, raloxifene treatment, oral contraceptive use, hormone replacement therapy, and pregnancy. Management of thrombotic risk and thrombotic events should follow established guidelines and fit the clinical circumstance. This result cannot predict the occurrence or recurrence of a thrombotic event. Comment: Genetic counseling is recommended to discuss the potential  clinical implications of positive results, as well as recommendations for testing family members. Genetic Coordinators are available for health care providers to discuss results at [phone]). Test Details: Variant Analyzed: c.1601G>A (p. Arg534Gln), referred to as Fact or V Leiden Methods/Limitations: DNA analysis of the F5 gene (NM_000130.5) was performed by PCR amplification followed by restriction enzyme analysis. The diagnostic sensitivity is >99%. Results must be combined with clinical information for the most accurate interpretation. Molecular- based testing is highly accurate, but as in any laboratory test, diagnostic errors may occur. False positive or false negative results may occur for reasons that include  genetic variants, blood transfusions, bone marrow transplantation, somatic or tissue-specific mosaicism, mislabeled samples, or erroneous representation of family relationships. This test was developed and its performance characteristics determined by [facility]. It has not been cleared or approved by the Food and Drug Administration. References: Dewitt Hoes [facility], Valentina Lucks [facility]; ACMG Professional Practice and Guidelines Committee. Addendum: [employer] of Medical Genetics consensus statement on fac tor V Leiden mutation testing. Genet Med. 2021 Mar 5. doi: 96.0454/U98119-147- 01108-x. PMID: 82956213. Cherrie Gauze. Factor V Leiden Thrombophilia. 1999 May 14 (Updated 2018 Jan 4). In: Bufford Buttner, Ardinger HH, Pagon RA, et al., editors. GeneReviews(R) (Internet). [address] ([place]): [place], [place]; 0865-7846. Available from: [email or web address]-mcgee.org/ Nita Sickle, Blanca Friend, Alvie Heidelberg CS; ACMG Laboratory Quality Assurance Committee. Venous thromboembolism laboratory testing (factor V Leiden and factor II c.*97G>A), 2018 update: a technical standard of the [employer] of [employer] and [employer] (ACMG). Genet Med. 2018 Dec;20(12):1489-1498. doi: 10.1038/[id number]. Epub 2018 Oct 5. PMID: 96295284.    Reviewed By: Comment     Comment: (NOTE) Technical Component performed at [employer] [facility] Professional Component performed by: [employer], Ph.D., [facility] Director, Molecular Genetics [phone] [place] [address] [place] [address] Performed At: [facility] [address] [place], [place] [id number] Maurine Simmering MDPhD [id number]   Antithrombin III     Status: None   Collection Time: 05/08/23 10:12 AM  Result Value Ref Range   AntiThromb III Func 92 75 - 120 %    Comment: Performed at [facility], [address], [place], [place] [address]   Protein S, total     Status: None   Collection Time: 05/08/23 10:12 AM  Result Value Ref Range   Protein S Ag, Total 85 60 - 150 %    Comment: (NOTE) This test was developed and its performance characteristics determined by [facility]. It has not been cleared or approved by the Food and Drug Administration. Performed At: [facility] [address] [place], [place] [id number] Jolene Schimke MD AY:[id number]   Protein S activity     Status: None   Collection Time: 05/08/23 10:12 AM  Result Value Ref Range   Protein S Activity 85 63 - 140 %    Comment: (NOTE) Protein S activity may be falsely increased (masking an abnormal, low result) in patients receiving direct Xa inhibitor (e.g., rivaroxaban, apixaban, edoxaban) or a direct thrombin inhibitor (e.g., dabigatran) anticoagulant treatment due to assay interference by these drugs. Performed At: [facility] [address] [place], [place] [id number] Jolene Schimke MD RK:[id number]   Protein C activity     Status: None   Collection Time: 05/08/23 10:12 AM  Result Value Ref Range   Protein C Activity 101 73 - 180 %    Comment: (NOTE) Performed At: [facility]  [address] [place], [place] [id number] Jolene Schimke MD WU:[id number]   Protein C, total     Status: None   Collection Time: 05/08/23 10:12 AM  Result Value Ref Range   Protein C, Total 72 60 - 150 %    Comment: (NOTE) Performed At: [facility] [address] [place], [place] [id number] Jolene Schimke MD VH:[id number]   Lupus anticoagulant panel     Status: Abnormal   Collection Time: 05/08/23 10:12 AM  Result Value Ref Range   PTT Lupus Anticoagulant 42.3 0.0 - 43.5 sec   DRVVT 68.5 (H) 0.0 - 47.0 sec   Lupus Anticoag Interp Comment:     Comment: (NOTE) No lupus anticoagulant was detected. These results are consistent with specific inhibitors to one or more common pathway factors (X, V, II or fibrinogen). Performed At: [facility] [address] [place], [place] [id number] Jolene Schimke MD UV:[id number]   Cardiolipin antibodies, IgG, IgM, IgA     Status: Abnormal   Collection Time: 05/08/23 10:12 AM  Result Value Ref Range   Anticardiolipin IgG <9 0 - 14 GPL U/mL    Comment: (NOTE)                          Negative:              <15                          Indeterminate:     15 - 20                          Low-Med Positive: >20 - 80                          High Positive:         >80    Anticardiolipin IgM 31 (H) 0 - 12 MPL U/mL    Comment: (NOTE)                          Negative:              <13                          Indeterminate:     13 - 20                          Low-Med Positive: >20 - 80                          High Positive:         >80    Anticardiolipin IgA <9 0 - 11 APL U/mL    Comment: (NOTE)                          Negative:              <12                          Indeterminate:     12 - 20  Low-Med Positive: >20 - 80                          High Positive:         >80 Performed At: [facility] [address] [place], [place] [id number] Jolene Schimke MD WJ:[id number]   dRVVT Mix     Status: Abnormal   Collection Time: 05/08/23 10:12 AM  Result Value Ref Range   dRVVT Mix 50.4 (H) 0.0 - 40.4 sec    Comment: (NOTE) Performed At: [facility] [address] [place], [place] [id number] Jolene Schimke MD IO:[id number]   dRVVT Confirm     Status: None   Collection Time: 05/08/23 10:12 AM  Result Value Ref Range   dRVVT Confirm 0.9 0.8 - 1.2 ratio    Comment: (NOTE) Performed At: [facility] [address] [place], [place] [id number] Jolene Schimke MD ZD:[id number]     Lab Results  Component Value Date   PSA1 7.7 (H) 01/18/2021   PSA1 9.9 (H) 02/04/2020  PSA 10.5 in 10/24.  CT ABDOMEN PELVIS W CONTRAST Result Date: 04/22/2023 CLINICAL DATA:  Acute abdominal pain. Patient reports fever and body  aches. EXAM: CT ABDOMEN AND PELVIS WITH CONTRAST TECHNIQUE: Multidetector CT imaging of the abdomen and pelvis was performed using the standard protocol following bolus administration of intravenous contrast. RADIATION DOSE REDUCTION: This exam was performed according to the departmental dose-optimization program which includes automated exposure control, adjustment of the mA and/or kV according to patient size and/or use of iterative reconstruction technique. CONTRAST:  OMNIPAQUE IOHEXOL 350 MG/ML SOLN COMPARISON:  CT 01/31/2021 FINDINGS: Lower chest: Assessed on concurrent chest CTA, reported separately. Hepatobiliary: Benign cyst in the subcapsular right lobe of the liver. No suspicious liver lesion. Unremarkable gallbladder. There is mild motion artifact which limits detailed assessment. Pancreas: Parenchymal atrophy. No ductal dilatation or inflammation. Spleen: Normal in size without focal abnormality. Adrenals/Urinary Tract: No adrenal nodule. No hydronephrosis or perinephric edema. Homogeneous renal enhancement with symmetric excretion on delayed phase imaging. There are multiple bilateral renal cysts. The largest cyst is in the right mid upper kidney measuring 10.5 cm. No further follow-up imaging is recommended. Urinary bladder is nondistended and not well assessed due to streak artifact from right hip arthroplasty. Stomach/Bowel: Midline ventral abdominal wall hernia contains short segment of small bowel. There is no associated wall thickening or obstruction. Right hemicolectomy. Colonic diverticulosis without diverticulitis. Moderate volume of colonic stool. Previous right inguinal hernia has been repaired. Vascular/Lymphatic: Aortic atherosclerosis without aneurysm. 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 No enlarged lymph nodes in the abdomen or pelvis. Reproductive: Fiducial markers or clips in the prostate. Other: Postsurgical change in the right  inguinal canal, previous right inguinal hernia has been repaired. Midline ventral abdominal wall hernia contains short segment of small bowel without inflammation or obstruction. No ascites or free air. Musculoskeletal: Right hip arthroplasty. Chronic left hip arthropathy with flattening of the femoral head, near complete joint space loss, subchondral cystic change and sclerosis. This is similar in appearance to 11/27/2022 pelvis radiograph. There is a left hip joint effusion. Multilevel degenerative change in the lumbar spine. IMPRESSION: 1. 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 2. Midline ventral abdominal wall hernia contains short segment of small bowel without inflammation or obstruction. 3. Colonic diverticulosis without diverticulitis. 4. Chronic left hip arthropathy with flattening of the femoral head, near complete joint space loss,  subchondral cystic change and sclerosis. There is a left hip joint effusion. 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 Clinical correlation is recommended in the setting of fever for hip infection, although chronic changes were seen on September 2024 radiograph and appear similar. Aortic Atherosclerosis (ICD10-I70.0). These results were called by telephone at the time of interpretation on 04/22/2023 at 6:57 pm to provider Gloris Manchester , who verbally acknowledged these results. Electronically Signed   By: Narda Rutherford M.D.   On: 04/22/2023 18:58   CT Angio Chest PE W and/or Wo Contrast Result Date: 04/22/2023 CLINICAL DATA:  Pulmonary embolism (PE) suspected, high prob Patient presents with fever and body aches. EXAM: CT ANGIOGRAPHY CHEST WITH CONTRAST TECHNIQUE: Multidetector CT imaging of the chest was performed using the standard protocol during bolus administration of intravenous contrast. Multiplanar CT image reconstructions and MIPs were obtained to evaluate the vascular anatomy. Performed in conjunction with CT of the abdomen and  pelvis. RADIATION DOSE REDUCTION: This exam was performed according to the departmental dose-optimization program which includes automated exposure control, adjustment of the mA and/or kV according to patient size and/or use of iterative reconstruction technique. CONTRAST:  OMNIPAQUE IOHEXOL 350 MG/ML SOLN COMPARISON:  Chest radiograph earlier today. FINDINGS: Cardiovascular: There are no filling defects within the pulmonary arteries to suggest pulmonary embolus. Dilated main pulmonary artery at 3.7 cm. The thoracic aorta is tortuous. No acute aortic finding. The heart is upper normal in size. No pericardial effusion. Mediastinum/Nodes: No mediastinal or hilar adenopathy. Unremarkable esophagus. No thyroid nodule. Lungs/Pleura: Mild lower lobe bronchial thickening. Bandlike right lower lobe opacities favor atelectasis. There is no confluent airspace disease. No pleural fluid. No pulmonary mass or suspicious nodule. No features of pulmonary edema. Trachea and central most airways are clear. Upper Abdomen: Assessed on concurrent abdominopelvic CT, reported separately. Musculoskeletal: Diffuse thoracic spondylosis with anterior spurring, bulky anterior osteophytes in the lower cervical spine. Mild broad-based dextroscoliotic curvature. The right shoulder is dislocated with the humeral head directed anteriorly and flattening of the glenoid. There are erosive changes involving the shoulder joint which are suboptimally assessed this is a change from 11/05/2021 shoulder CT. Review of the MIP images confirms the above findings. IMPRESSION: 1. No pulmonary embolus. 2. Mild lower lobe bronchial thickening, can be seen with bronchitis or reactive airways disease. 3. Right shoulder dislocation with the humeral head directed anteriorly and flattening of the glenoid. There are erosive changes involving the shoulder joint which are suboptimally assessed. This is a change from 11/05/2021 shoulder CT. Recommend correlation  with history of shoulder pain. 4. Dilated main pulmonary artery, can be seen with pulmonary arterial hypertension. Electronically Signed   By: Narda Rutherford M.D.   On: 04/22/2023 18:47   US Abdomen Limited RUQ (LIVER/GB) Result Date: 04/22/2023 CLINICAL DATA:  Fever. EXAM: ULTRASOUND ABDOMEN LIMITED RIGHT UPPER QUADRANT COMPARISON:  CT of the abdomen pelvis dated 01/31/2021. FINDINGS: Gallbladder: No gallstones or wall thickening visualized. No sonographic Murphy sign noted by sonographer. Common bile duct: Diameter: 7 mm Liver: The liver demonstrates a normal echogenicity. There is a 1.8 x 1.3 x 1.7 cm cyst in the right lobe of the liver also present on the prior CT of 01/31/2021. Portal vein is patent on color Doppler imaging with normal direction of blood flow towards the liver. Other: Partially visualized large right renal cyst measuring up to 11 cm. IMPRESSION: 1. No gallstone. 2. Small right liver cyst. 3. Partially visualized right renal cyst. Electronically Signed   By: Elgie Collard  M.D.   On: 04/22/2023 17:27   DG Chest Port 1 View Result Date: 04/22/2023 CLINICAL DATA:  Cough EXAM: PORTABLE CHEST 1 VIEW COMPARISON:  X-ray 04/12/2023 FINDINGS: Slight linear opacity lung bases likely scar or atelectasis. No consolidation, pneumothorax or effusion. No edema. Normal cardiopericardial silhouette. Tortuous ectatic aorta. Degenerative changes of the spine with curvature. Fixation hardware along the lower cervical spine. IMPRESSION: Minimal basilar atelectasis.  Tortuous ectatic aorta. Electronically Signed   By: Karen Kays M.D.   On: 04/22/2023 13:22      Assessment & Plan: Prostate cancer with rising PSA post treatment.  HIs PSA is up further but only minimally.  A CT AP on 04/22/23 showed no mets.  I will repeat again in 6 months and if there is a further increase.  He will need a PSMA PET for further staging.   BPH with BOO.  He is incontinent with minimal nocturia on no therapy but is  content.   Right inguinal hernia.   This was treated surgically and has no recurred.     No orders of the defined types were placed in this encounter.    Orders Placed This Encounter  Procedures   PSA    Standing Status:   Future    Expected Date:   11/12/2023    Expiration Date:   05/14/2024      Return in about 6 months (around 11/12/2023) for Sarah or any physician..   CC: Assunta Found, MD      Bjorn Pippin 05/15/2023 Patient ID: [name], male   DOB: [birth year], 83 y.o.   MRN: [id number]

## 2023-05-16 DIAGNOSIS — C61 Malignant neoplasm of prostate: Secondary | ICD-10-CM | POA: Diagnosis not present

## 2023-05-16 DIAGNOSIS — E559 Vitamin D deficiency, unspecified: Secondary | ICD-10-CM | POA: Diagnosis not present

## 2023-05-16 DIAGNOSIS — J449 Chronic obstructive pulmonary disease, unspecified: Secondary | ICD-10-CM | POA: Diagnosis not present

## 2023-05-16 DIAGNOSIS — R262 Difficulty in walking, not elsewhere classified: Secondary | ICD-10-CM | POA: Diagnosis not present

## 2023-05-16 DIAGNOSIS — H4020X0 Unspecified primary angle-closure glaucoma, stage unspecified: Secondary | ICD-10-CM | POA: Diagnosis not present

## 2023-05-16 DIAGNOSIS — M6281 Muscle weakness (generalized): Secondary | ICD-10-CM | POA: Diagnosis not present

## 2023-05-16 LAB — PROTHROMBIN GENE MUTATION

## 2023-05-19 ENCOUNTER — Telehealth: Payer: Self-pay

## 2023-05-19 DIAGNOSIS — J449 Chronic obstructive pulmonary disease, unspecified: Secondary | ICD-10-CM | POA: Diagnosis not present

## 2023-05-19 DIAGNOSIS — R262 Difficulty in walking, not elsewhere classified: Secondary | ICD-10-CM | POA: Diagnosis not present

## 2023-05-19 DIAGNOSIS — E559 Vitamin D deficiency, unspecified: Secondary | ICD-10-CM | POA: Diagnosis not present

## 2023-05-19 DIAGNOSIS — I81 Portal vein thrombosis: Secondary | ICD-10-CM | POA: Diagnosis not present

## 2023-05-19 DIAGNOSIS — C61 Malignant neoplasm of prostate: Secondary | ICD-10-CM | POA: Diagnosis not present

## 2023-05-19 DIAGNOSIS — H4020X0 Unspecified primary angle-closure glaucoma, stage unspecified: Secondary | ICD-10-CM | POA: Diagnosis not present

## 2023-05-19 DIAGNOSIS — R279 Unspecified lack of coordination: Secondary | ICD-10-CM | POA: Diagnosis not present

## 2023-05-19 DIAGNOSIS — M6281 Muscle weakness (generalized): Secondary | ICD-10-CM | POA: Diagnosis not present

## 2023-05-19 NOTE — Telephone Encounter (Signed)
 Patient's granddaughter called in today to give our office the name of the urology patient saw in [place] for his second diagnosed of prostate cancer. Rinaldo Cloud states she will contact the urologist office for records and she will also mail our office patient's medical records that she have access to through patient's my chart.

## 2023-05-20 ENCOUNTER — Telehealth: Payer: Self-pay | Admitting: Gastroenterology

## 2023-05-20 ENCOUNTER — Encounter: Payer: Self-pay | Admitting: Gastroenterology

## 2023-05-20 ENCOUNTER — Ambulatory Visit (INDEPENDENT_AMBULATORY_CARE_PROVIDER_SITE_OTHER): Payer: Medicare HMO | Admitting: Gastroenterology

## 2023-05-20 VITALS — BP 117/63 | HR 128 | Temp 101.6°F | Ht 66.0 in | Wt 189.0 lb

## 2023-05-20 DIAGNOSIS — D649 Anemia, unspecified: Secondary | ICD-10-CM

## 2023-05-20 NOTE — Patient Instructions (Addendum)
 Patient to go back to the facility, [facility], for evaluation by staff provider.  He may need to be in the ED for further evaluation.  Presented with lethargy, tachycardia, temp of 101.6.  Per CNA staff present today, this is not his baseline. We will send orders later next month to update labs regarding anemia and liver.  Further recommendations to follow those results.  At this time cannot have a meaningful conversation with patient regarding potential workup for anemia.

## 2023-05-20 NOTE — Telephone Encounter (Signed)
 Please arrange for CBC, iron/tibc/ferritin, LFTs, smooth muscle antibody in 4 WEEKs.   Please make sure my ov note goes to SNF.

## 2023-05-20 NOTE — Progress Notes (Addendum)
 GI Office Note    Referring Provider: Assunta Found, MD Primary Care Physician:  Assunta Found, MD  Primary Gastroenterologist: Dr. Levon Hedger  Chief Complaint   Chief Complaint  Patient presents with   Hospitalization Follow-up    Patient arrives today with caregiver Bonita Quin from [facility]. Hospitalization follow up for anemia. Patient states he is having some dizziness.     History of Present Illness   Randy Russo is a 83 y.o. male presenting today for hospital follow up and to discuss anemia.   Seen while inpatient last month, GI consulted for portal vein thrombosis and elevated LFTs. Presented to hospital due to "kidneys were not functioning well". Brought to ED via EMS from SNF with bodyaches, occasional nonproductive cough and fever.   No history of liver disease. Patient was started on heparin. CTA neg for PE. There was concern for septic thrombophlebitis in setting of systemic symptoms to include fever. Etiology of PVT unclear though likely secondary to inflammatory/infectious states. Recommended hematology evaluation for possible hypercoagulable work up and guidance on long-term anticoagulation. Patient was seen by hematology while inpatient and currently under their care.   Today: Patient presents today with nursing home staff.  Staff is concerned that he is not his typical self.  More lethargic today.  Falling asleep during the evaluation.  Is arousable but seems confused.  Baseline not known.  He is not sure where he is.  States it is 2019.  Occasional cough noted.  Really unable to answer questions today with any reliability.  He did report to the nurse that his knee hurts but at this time denies any pain.  He denies vomiting or diarrhea.  During recent admission: Total bilirubin: 2.5--> 2.4--> 1.9-->1.2 Alkaline phosphatase: 84--> 75--> 85-->79 AST: 183--> 106--> 112-->31 ALT: 177-> 139--> 146-->87 LFTs normal April 12, 2023 Hemoglobin initially  10.6-->9.2 MCV normal Folate 16.9, B12 966, iron 19, TIBC 170, iron sats 11%, ferritin 300 Copper elevated at 159  Acute hepatitis panel was negative.  Acetaminophen level was elevated at 39 ANA negative Mitochondrial antibody less than 20 F-actin IgG 24 positive IgG 1330 On 05/08/23, LFTs now normal.   Last EGD:12/2017- Normal esophagus.                           - Z-line irregular, 40 cm from the incisors.                           - Non-bleeding erosive gastropathy.                           - Normal duodenal bulb and second portion of the                            duodenum.                           - No specimens collected. Last Colonoscopy: 12/2017- Diverticulosis in the sigmoid colon, at the                            hepatic flexure, in the ascending colon and in the  cecum.                           - External hemorrhoids.                           - No specimens collected.                           Comment: Colonic diverticular bleed.  Medications   Current Outpatient Medications  Medication Sig Dispense Refill   acetaminophen (TYLENOL) 650 MG CR tablet Take 650 mg by mouth every 4 (four) hours as needed for pain.     albuterol (PROVENTIL) (2.5 MG/3ML) 0.083% nebulizer solution Take 3 mLs (2.5 mg total) by nebulization every 6 (six) hours as needed for wheezing or shortness of breath. 75 mL 12   apixaban (ELIQUIS) 5 MG TABS tablet Take 2 tablets (10 mg total) by mouth 2 (two) times daily for 6 days, THEN 1 tablet (5 mg total) 2 (two) times daily. 60 tablet 1   B Complex-C (B-COMPLEX WITH VITAMIN C) tablet Take 1 tablet by mouth daily.     Cholecalciferol 25 MCG (1000 UT) capsule Take 1 capsule (1,000 Units total) by mouth daily.     docusate sodium (COLACE) 100 MG capsule Take 2 capsules (200 mg total) by mouth daily. 10 capsule 0   ferrous sulfate 325 (65 FE) MG tablet Take 1 tablet (325 mg total) by mouth 2 (two) times daily with a meal. 30  tablet 3   furosemide (LASIX) 20 MG tablet Take 20 mg by mouth daily.     latanoprost (XALATAN) 0.005 % ophthalmic solution Place 1 drop into both eyes at bedtime.     loratadine (CLARITIN) 10 MG tablet Take 1 tablet (10 mg total) by mouth daily.     SPIRIVA RESPIMAT 2.5 MCG/ACT AERS Inhale 2 puffs into the lungs daily.     No current facility-administered medications for this visit.    Allergies   Allergies as of 05/20/2023   (No Known Allergies)        Review of Systems  Unable to obtain    Physical Exam   BP 117/63   Pulse (!) 128   Temp (!) 101.6 F (38.7 C) (Oral)   Ht 5\' 6"  (1.676 m)   Wt 189 lb (85.7 kg) Comment: per caregiver  BMI 30.51 kg/m    General: lethargic, but arouses with verbal stimuli. Confused.   Eyes: No icterus. Mouth: Oropharyngeal mucosa moist and pink   Lungs: Clear to auscultation bilaterally.  Heart: Regular rate and rhythm, no murmurs rubs or gallops.  Abdomen: Bowel sounds are normal, nontender, nondistended, no hepatosplenomegaly or masses,  no abdominal bruits or hernia , no rebound or guarding.  Rectal: not performed  Extremities: No lower extremity edema. No clubbing or deformities. Neuro: Alert and oriented x 4   Skin: Warm and dry, no jaundice.   Psych: Alert and cooperative, normal mood and affect.  Labs   See hpi  Imaging Studies   CT ABDOMEN PELVIS W CONTRAST Result Date: 04/22/2023 CLINICAL DATA:  Acute abdominal pain. Patient reports fever and body aches. EXAM: CT ABDOMEN AND PELVIS WITH CONTRAST TECHNIQUE: Multidetector CT imaging of the abdomen and pelvis was performed using the standard protocol following bolus administration of intravenous contrast. RADIATION DOSE REDUCTION: This exam was performed according to the departmental dose-optimization program which  includes automated exposure control, adjustment of the mA and/or kV according to patient size and/or use of iterative reconstruction technique. CONTRAST:   OMNIPAQUE IOHEXOL 350 MG/ML SOLN COMPARISON:  CT 01/31/2021 FINDINGS: Lower chest: Assessed on concurrent chest CTA, reported separately. Hepatobiliary: Benign cyst in the subcapsular right lobe of the liver. No suspicious liver lesion. Unremarkable gallbladder. There is mild motion artifact which limits detailed assessment. Pancreas: Parenchymal atrophy. No ductal dilatation or inflammation. Spleen: Normal in size without focal abnormality. Adrenals/Urinary Tract: No adrenal nodule. No hydronephrosis or perinephric edema. Homogeneous renal enhancement with symmetric excretion on delayed phase imaging. There are multiple bilateral renal cysts. The largest cyst is in the right mid upper kidney measuring 10.5 cm. No further follow-up imaging is recommended. Urinary bladder is nondistended and not well assessed due to streak artifact from right hip arthroplasty. Stomach/Bowel: Midline ventral abdominal wall hernia contains short segment of small bowel. There is no associated wall thickening or obstruction. Right hemicolectomy. Colonic diverticulosis without diverticulitis. Moderate volume of colonic stool. Previous right inguinal hernia has been repaired. Vascular/Lymphatic: Aortic atherosclerosis without aneurysm. 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 No enlarged lymph nodes in the abdomen or pelvis. Reproductive: Fiducial markers or clips in the prostate. Other: Postsurgical change in the right inguinal canal, previous right inguinal hernia has been repaired. Midline ventral abdominal wall hernia contains short segment of small bowel without inflammation or obstruction. No ascites or free air. Musculoskeletal: Right hip arthroplasty. Chronic left hip arthropathy with flattening of the femoral head, near complete joint space loss, subchondral cystic change and sclerosis. This is similar in appearance to 11/27/2022 pelvis radiograph. There is a left hip joint effusion.  Multilevel degenerative change in the lumbar spine. IMPRESSION: 1. 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 2. Midline ventral abdominal wall hernia contains short segment of small bowel without inflammation or obstruction. 3. Colonic diverticulosis without diverticulitis. 4. Chronic left hip arthropathy with flattening of the femoral head, near complete joint space loss, subchondral cystic change and sclerosis. There is a left hip joint effusion. 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 Clinical correlation is recommended in the setting of fever for hip infection, although chronic changes were seen on September 2024 radiograph and appear similar. Aortic Atherosclerosis (ICD10-I70.0). These results were called by telephone at the time of interpretation on 04/22/2023 at 6:57 pm to provider Gloris Manchester , who verbally acknowledged these results. Electronically Signed   By: Narda Rutherford M.D.   On: 04/22/2023 18:58   CT Angio Chest PE W and/or Wo Contrast Result Date: 04/22/2023 CLINICAL DATA:  Pulmonary embolism (PE) suspected, high prob Patient presents with fever and body aches. EXAM: CT ANGIOGRAPHY CHEST WITH CONTRAST TECHNIQUE: Multidetector CT imaging of the chest was performed using the standard protocol during bolus administration of intravenous contrast. Multiplanar CT image reconstructions and MIPs were obtained to evaluate the vascular anatomy. Performed in conjunction with CT of the abdomen and pelvis. RADIATION DOSE REDUCTION: This exam was performed according to the departmental dose-optimization program which includes automated exposure control, adjustment of the mA and/or kV according to patient size and/or use of iterative reconstruction technique. CONTRAST:  OMNIPAQUE IOHEXOL 350 MG/ML SOLN COMPARISON:  Chest radiograph earlier today. FINDINGS: Cardiovascular: There are no filling defects within the pulmonary arteries to suggest pulmonary embolus. Dilated main  pulmonary artery at 3.7 cm. The thoracic aorta is tortuous. No acute aortic finding. The heart is upper normal in size.  No pericardial effusion. Mediastinum/Nodes: No mediastinal or hilar adenopathy. Unremarkable esophagus. No thyroid nodule. Lungs/Pleura: Mild lower lobe bronchial thickening. Bandlike right lower lobe opacities favor atelectasis. There is no confluent airspace disease. No pleural fluid. No pulmonary mass or suspicious nodule. No features of pulmonary edema. Trachea and central most airways are clear. Upper Abdomen: Assessed on concurrent abdominopelvic CT, reported separately. Musculoskeletal: Diffuse thoracic spondylosis with anterior spurring, bulky anterior osteophytes in the lower cervical spine. Mild broad-based dextroscoliotic curvature. The right shoulder is dislocated with the humeral head directed anteriorly and flattening of the glenoid. There are erosive changes involving the shoulder joint which are suboptimally assessed this is a change from 11/05/2021 shoulder CT. Review of the MIP images confirms the above findings. IMPRESSION: 1. No pulmonary embolus. 2. Mild lower lobe bronchial thickening, can be seen with bronchitis or reactive airways disease. 3. Right shoulder dislocation with the humeral head directed anteriorly and flattening of the glenoid. There are erosive changes involving the shoulder joint which are suboptimally assessed. This is a change from 11/05/2021 shoulder CT. Recommend correlation with history of shoulder pain. 4. Dilated main pulmonary artery, can be seen with pulmonary arterial hypertension. Electronically Signed   By: Narda Rutherford M.D.   On: 04/22/2023 18:47   US Abdomen Limited RUQ (LIVER/GB) Result Date: 04/22/2023 CLINICAL DATA:  Fever. EXAM: ULTRASOUND ABDOMEN LIMITED RIGHT UPPER QUADRANT COMPARISON:  CT of the abdomen pelvis dated 01/31/2021. FINDINGS: Gallbladder: No gallstones or wall thickening visualized. No sonographic Murphy sign noted by  sonographer. Common bile duct: Diameter: 7 mm Liver: The liver demonstrates a normal echogenicity. There is a 1.8 x 1.3 x 1.7 cm cyst in the right lobe of the liver also present on the prior CT of 01/31/2021. Portal vein is patent on color Doppler imaging with normal direction of blood flow towards the liver. Other: Partially visualized large right renal cyst measuring up to 11 cm. IMPRESSION: 1. No gallstone. 2. Small right liver cyst. 3. Partially visualized right renal cyst. Electronically Signed   By: Elgie Collard M.D.   On: 04/22/2023 17:27   DG Chest Port 1 View Result Date: 04/22/2023 CLINICAL DATA:  Cough EXAM: PORTABLE CHEST 1 VIEW COMPARISON:  X-ray 04/12/2023 FINDINGS: Slight linear opacity lung bases likely scar or atelectasis. No consolidation, pneumothorax or effusion. No edema. Normal cardiopericardial silhouette. Tortuous ectatic aorta. Degenerative changes of the spine with curvature. Fixation hardware along the lower cervical spine. IMPRESSION: Minimal basilar atelectasis.  Tortuous ectatic aorta. Electronically Signed   By: Karen Kays M.D.   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 Baseline cognitive ability is unknown to me.  -we will update labs in four weeks for anemia, consider follow up ov at that time to discuss risks/benefits of EGD/colonoscopy, would need to hold eliquis  Febrile illness: -slight tachycardia in office -patient somewhat lethargic -recommend patient be evaluated by SNF provider to determine if ED evaluation is needed   Leanna Battles. Melvyn Neth, MHS, PA-C [facility]  I have reviewed the note and agree with the APP's assessment as described in this progress note  Agree with proceeding with EGD and colonoscopy once mentation improves, especially  in the setting of worsening anemia.  Katrinka Blazing, MD Gastroenterology and Hepatology [facility]

## 2023-05-21 ENCOUNTER — Other Ambulatory Visit: Payer: Self-pay

## 2023-05-21 DIAGNOSIS — R262 Difficulty in walking, not elsewhere classified: Secondary | ICD-10-CM | POA: Diagnosis not present

## 2023-05-21 DIAGNOSIS — R5383 Other fatigue: Secondary | ICD-10-CM | POA: Diagnosis not present

## 2023-05-21 DIAGNOSIS — J449 Chronic obstructive pulmonary disease, unspecified: Secondary | ICD-10-CM | POA: Diagnosis not present

## 2023-05-21 DIAGNOSIS — E559 Vitamin D deficiency, unspecified: Secondary | ICD-10-CM | POA: Diagnosis not present

## 2023-05-21 DIAGNOSIS — D649 Anemia, unspecified: Secondary | ICD-10-CM

## 2023-05-21 DIAGNOSIS — I81 Portal vein thrombosis: Secondary | ICD-10-CM | POA: Diagnosis not present

## 2023-05-21 DIAGNOSIS — H4020X0 Unspecified primary angle-closure glaucoma, stage unspecified: Secondary | ICD-10-CM | POA: Diagnosis not present

## 2023-05-21 DIAGNOSIS — M6281 Muscle weakness (generalized): Secondary | ICD-10-CM | POA: Diagnosis not present

## 2023-05-21 DIAGNOSIS — R279 Unspecified lack of coordination: Secondary | ICD-10-CM | POA: Diagnosis not present

## 2023-05-21 DIAGNOSIS — C61 Malignant neoplasm of prostate: Secondary | ICD-10-CM | POA: Diagnosis not present

## 2023-05-21 NOTE — Telephone Encounter (Signed)
 Lab orders will be placed and will be faxed to the facility to complete in 4 weeks.

## 2023-05-22 DIAGNOSIS — N390 Urinary tract infection, site not specified: Secondary | ICD-10-CM | POA: Diagnosis not present

## 2023-05-22 DIAGNOSIS — R4182 Altered mental status, unspecified: Secondary | ICD-10-CM | POA: Diagnosis not present

## 2023-05-23 DIAGNOSIS — M6281 Muscle weakness (generalized): Secondary | ICD-10-CM | POA: Diagnosis not present

## 2023-05-23 DIAGNOSIS — H4020X0 Unspecified primary angle-closure glaucoma, stage unspecified: Secondary | ICD-10-CM | POA: Diagnosis not present

## 2023-05-23 DIAGNOSIS — R262 Difficulty in walking, not elsewhere classified: Secondary | ICD-10-CM | POA: Diagnosis not present

## 2023-05-23 DIAGNOSIS — J449 Chronic obstructive pulmonary disease, unspecified: Secondary | ICD-10-CM | POA: Diagnosis not present

## 2023-05-23 DIAGNOSIS — E559 Vitamin D deficiency, unspecified: Secondary | ICD-10-CM | POA: Diagnosis not present

## 2023-05-26 DIAGNOSIS — E559 Vitamin D deficiency, unspecified: Secondary | ICD-10-CM | POA: Diagnosis not present

## 2023-05-26 DIAGNOSIS — I10 Essential (primary) hypertension: Secondary | ICD-10-CM | POA: Diagnosis not present

## 2023-05-26 DIAGNOSIS — I429 Cardiomyopathy, unspecified: Secondary | ICD-10-CM | POA: Diagnosis not present

## 2023-05-26 DIAGNOSIS — N390 Urinary tract infection, site not specified: Secondary | ICD-10-CM | POA: Diagnosis not present

## 2023-05-26 DIAGNOSIS — J449 Chronic obstructive pulmonary disease, unspecified: Secondary | ICD-10-CM | POA: Diagnosis not present

## 2023-05-26 DIAGNOSIS — M25511 Pain in right shoulder: Secondary | ICD-10-CM | POA: Diagnosis not present

## 2023-05-26 DIAGNOSIS — H4020X0 Unspecified primary angle-closure glaucoma, stage unspecified: Secondary | ICD-10-CM | POA: Diagnosis not present

## 2023-05-26 DIAGNOSIS — M6281 Muscle weakness (generalized): Secondary | ICD-10-CM | POA: Diagnosis not present

## 2023-05-26 DIAGNOSIS — M25521 Pain in right elbow: Secondary | ICD-10-CM | POA: Diagnosis not present

## 2023-05-26 DIAGNOSIS — J121 Respiratory syncytial virus pneumonia: Secondary | ICD-10-CM | POA: Diagnosis not present

## 2023-05-26 DIAGNOSIS — R5383 Other fatigue: Secondary | ICD-10-CM | POA: Diagnosis not present

## 2023-05-26 DIAGNOSIS — R41841 Cognitive communication deficit: Secondary | ICD-10-CM | POA: Diagnosis not present

## 2023-05-26 DIAGNOSIS — R262 Difficulty in walking, not elsewhere classified: Secondary | ICD-10-CM | POA: Diagnosis not present

## 2023-05-26 DIAGNOSIS — R899 Unspecified abnormal finding in specimens from other organs, systems and tissues: Secondary | ICD-10-CM | POA: Diagnosis not present

## 2023-05-26 DIAGNOSIS — Z0189 Encounter for other specified special examinations: Secondary | ICD-10-CM | POA: Diagnosis not present

## 2023-05-26 DIAGNOSIS — M79631 Pain in right forearm: Secondary | ICD-10-CM | POA: Diagnosis not present

## 2023-05-26 DIAGNOSIS — R279 Unspecified lack of coordination: Secondary | ICD-10-CM | POA: Diagnosis not present

## 2023-05-26 DIAGNOSIS — M79601 Pain in right arm: Secondary | ICD-10-CM | POA: Diagnosis not present

## 2023-05-27 DIAGNOSIS — M6281 Muscle weakness (generalized): Secondary | ICD-10-CM | POA: Diagnosis not present

## 2023-05-27 DIAGNOSIS — R41841 Cognitive communication deficit: Secondary | ICD-10-CM | POA: Diagnosis not present

## 2023-05-27 DIAGNOSIS — J449 Chronic obstructive pulmonary disease, unspecified: Secondary | ICD-10-CM | POA: Diagnosis not present

## 2023-05-27 DIAGNOSIS — R279 Unspecified lack of coordination: Secondary | ICD-10-CM | POA: Diagnosis not present

## 2023-05-28 DIAGNOSIS — R262 Difficulty in walking, not elsewhere classified: Secondary | ICD-10-CM | POA: Diagnosis not present

## 2023-05-28 DIAGNOSIS — E559 Vitamin D deficiency, unspecified: Secondary | ICD-10-CM | POA: Diagnosis not present

## 2023-05-28 DIAGNOSIS — R41841 Cognitive communication deficit: Secondary | ICD-10-CM | POA: Diagnosis not present

## 2023-05-28 DIAGNOSIS — H4020X0 Unspecified primary angle-closure glaucoma, stage unspecified: Secondary | ICD-10-CM | POA: Diagnosis not present

## 2023-05-28 DIAGNOSIS — M6281 Muscle weakness (generalized): Secondary | ICD-10-CM | POA: Diagnosis not present

## 2023-05-28 DIAGNOSIS — J449 Chronic obstructive pulmonary disease, unspecified: Secondary | ICD-10-CM | POA: Diagnosis not present

## 2023-05-28 DIAGNOSIS — R279 Unspecified lack of coordination: Secondary | ICD-10-CM | POA: Diagnosis not present

## 2023-05-29 DIAGNOSIS — J449 Chronic obstructive pulmonary disease, unspecified: Secondary | ICD-10-CM | POA: Diagnosis not present

## 2023-05-29 DIAGNOSIS — R41841 Cognitive communication deficit: Secondary | ICD-10-CM | POA: Diagnosis not present

## 2023-05-29 DIAGNOSIS — M6281 Muscle weakness (generalized): Secondary | ICD-10-CM | POA: Diagnosis not present

## 2023-05-29 DIAGNOSIS — R279 Unspecified lack of coordination: Secondary | ICD-10-CM | POA: Diagnosis not present

## 2023-05-30 DIAGNOSIS — J449 Chronic obstructive pulmonary disease, unspecified: Secondary | ICD-10-CM | POA: Diagnosis not present

## 2023-05-30 DIAGNOSIS — R5383 Other fatigue: Secondary | ICD-10-CM | POA: Diagnosis not present

## 2023-05-30 DIAGNOSIS — I429 Cardiomyopathy, unspecified: Secondary | ICD-10-CM | POA: Diagnosis not present

## 2023-05-30 DIAGNOSIS — J121 Respiratory syncytial virus pneumonia: Secondary | ICD-10-CM | POA: Diagnosis not present

## 2023-05-30 DIAGNOSIS — R899 Unspecified abnormal finding in specimens from other organs, systems and tissues: Secondary | ICD-10-CM | POA: Diagnosis not present

## 2023-05-30 DIAGNOSIS — M6281 Muscle weakness (generalized): Secondary | ICD-10-CM | POA: Diagnosis not present

## 2023-05-30 DIAGNOSIS — R279 Unspecified lack of coordination: Secondary | ICD-10-CM | POA: Diagnosis not present

## 2023-05-30 DIAGNOSIS — R41841 Cognitive communication deficit: Secondary | ICD-10-CM | POA: Diagnosis not present

## 2023-05-30 DIAGNOSIS — I10 Essential (primary) hypertension: Secondary | ICD-10-CM | POA: Diagnosis not present

## 2023-05-30 DIAGNOSIS — N390 Urinary tract infection, site not specified: Secondary | ICD-10-CM | POA: Diagnosis not present

## 2023-05-30 DIAGNOSIS — Z0189 Encounter for other specified special examinations: Secondary | ICD-10-CM | POA: Diagnosis not present

## 2023-05-31 ENCOUNTER — Inpatient Hospital Stay (HOSPITAL_COMMUNITY)
Admission: EM | Admit: 2023-05-31 | Discharge: 2023-06-06 | DRG: 871 | Disposition: A | Discharge status: Skilled Nursing Facility | Source: Skilled Nursing Facility | Attending: Internal Medicine | Admitting: Internal Medicine

## 2023-05-31 ENCOUNTER — Inpatient Hospital Stay (HOSPITAL_COMMUNITY)

## 2023-05-31 ENCOUNTER — Other Ambulatory Visit: Payer: Self-pay

## 2023-05-31 ENCOUNTER — Emergency Department (HOSPITAL_COMMUNITY)

## 2023-05-31 ENCOUNTER — Encounter (HOSPITAL_COMMUNITY): Payer: Self-pay | Admitting: *Deleted

## 2023-05-31 DIAGNOSIS — Z9049 Acquired absence of other specified parts of digestive tract: Secondary | ICD-10-CM

## 2023-05-31 DIAGNOSIS — A419 Sepsis, unspecified organism: Secondary | ICD-10-CM | POA: Diagnosis not present

## 2023-05-31 DIAGNOSIS — R531 Weakness: Secondary | ICD-10-CM | POA: Diagnosis not present

## 2023-05-31 DIAGNOSIS — Z7901 Long term (current) use of anticoagulants: Secondary | ICD-10-CM | POA: Diagnosis not present

## 2023-05-31 DIAGNOSIS — N400 Enlarged prostate without lower urinary tract symptoms: Secondary | ICD-10-CM | POA: Diagnosis present

## 2023-05-31 DIAGNOSIS — I10 Essential (primary) hypertension: Secondary | ICD-10-CM | POA: Diagnosis present

## 2023-05-31 DIAGNOSIS — J449 Chronic obstructive pulmonary disease, unspecified: Secondary | ICD-10-CM | POA: Diagnosis present

## 2023-05-31 DIAGNOSIS — Z0389 Encounter for observation for other suspected diseases and conditions ruled out: Secondary | ICD-10-CM | POA: Diagnosis not present

## 2023-05-31 DIAGNOSIS — R262 Difficulty in walking, not elsewhere classified: Secondary | ICD-10-CM | POA: Diagnosis present

## 2023-05-31 DIAGNOSIS — N3090 Cystitis, unspecified without hematuria: Secondary | ICD-10-CM | POA: Diagnosis present

## 2023-05-31 DIAGNOSIS — R471 Dysarthria and anarthria: Secondary | ICD-10-CM | POA: Diagnosis present

## 2023-05-31 DIAGNOSIS — K5731 Diverticulosis of large intestine without perforation or abscess with bleeding: Secondary | ICD-10-CM | POA: Diagnosis present

## 2023-05-31 DIAGNOSIS — Z515 Encounter for palliative care: Secondary | ICD-10-CM

## 2023-05-31 DIAGNOSIS — M6281 Muscle weakness (generalized): Secondary | ICD-10-CM | POA: Diagnosis not present

## 2023-05-31 DIAGNOSIS — B974 Respiratory syncytial virus as the cause of diseases classified elsewhere: Secondary | ICD-10-CM | POA: Diagnosis present

## 2023-05-31 DIAGNOSIS — N390 Urinary tract infection, site not specified: Secondary | ICD-10-CM | POA: Diagnosis not present

## 2023-05-31 DIAGNOSIS — D638 Anemia in other chronic diseases classified elsewhere: Secondary | ICD-10-CM | POA: Diagnosis present

## 2023-05-31 DIAGNOSIS — Z79899 Other long term (current) drug therapy: Secondary | ICD-10-CM | POA: Diagnosis not present

## 2023-05-31 DIAGNOSIS — Z87891 Personal history of nicotine dependence: Secondary | ICD-10-CM

## 2023-05-31 DIAGNOSIS — Z66 Do not resuscitate: Secondary | ICD-10-CM | POA: Diagnosis present

## 2023-05-31 DIAGNOSIS — R509 Fever, unspecified: Secondary | ICD-10-CM | POA: Diagnosis not present

## 2023-05-31 DIAGNOSIS — N281 Cyst of kidney, acquired: Secondary | ICD-10-CM | POA: Diagnosis not present

## 2023-05-31 DIAGNOSIS — Z1152 Encounter for screening for COVID-19: Secondary | ICD-10-CM

## 2023-05-31 DIAGNOSIS — B338 Other specified viral diseases: Secondary | ICD-10-CM | POA: Diagnosis not present

## 2023-05-31 DIAGNOSIS — I81 Portal vein thrombosis: Secondary | ICD-10-CM | POA: Diagnosis present

## 2023-05-31 DIAGNOSIS — L8992 Pressure ulcer of unspecified site, stage 2: Secondary | ICD-10-CM | POA: Diagnosis present

## 2023-05-31 DIAGNOSIS — K219 Gastro-esophageal reflux disease without esophagitis: Secondary | ICD-10-CM | POA: Diagnosis present

## 2023-05-31 DIAGNOSIS — G9341 Metabolic encephalopathy: Secondary | ICD-10-CM | POA: Diagnosis present

## 2023-05-31 DIAGNOSIS — Z8719 Personal history of other diseases of the digestive system: Secondary | ICD-10-CM | POA: Diagnosis not present

## 2023-05-31 DIAGNOSIS — Z9841 Cataract extraction status, right eye: Secondary | ICD-10-CM

## 2023-05-31 DIAGNOSIS — Z7189 Other specified counseling: Secondary | ICD-10-CM | POA: Diagnosis not present

## 2023-05-31 DIAGNOSIS — R296 Repeated falls: Secondary | ICD-10-CM | POA: Diagnosis present

## 2023-05-31 DIAGNOSIS — D509 Iron deficiency anemia, unspecified: Secondary | ICD-10-CM | POA: Diagnosis present

## 2023-05-31 DIAGNOSIS — D649 Anemia, unspecified: Secondary | ICD-10-CM | POA: Diagnosis not present

## 2023-05-31 DIAGNOSIS — K439 Ventral hernia without obstruction or gangrene: Secondary | ICD-10-CM | POA: Diagnosis present

## 2023-05-31 DIAGNOSIS — Z833 Family history of diabetes mellitus: Secondary | ICD-10-CM

## 2023-05-31 DIAGNOSIS — Z5941 Food insecurity: Secondary | ICD-10-CM

## 2023-05-31 DIAGNOSIS — L8990 Pressure ulcer of unspecified site, unspecified stage: Secondary | ICD-10-CM | POA: Diagnosis present

## 2023-05-31 DIAGNOSIS — Z9842 Cataract extraction status, left eye: Secondary | ICD-10-CM

## 2023-05-31 DIAGNOSIS — Z96641 Presence of right artificial hip joint: Secondary | ICD-10-CM | POA: Diagnosis present

## 2023-05-31 DIAGNOSIS — R17 Unspecified jaundice: Secondary | ICD-10-CM | POA: Diagnosis not present

## 2023-05-31 DIAGNOSIS — I959 Hypotension, unspecified: Secondary | ICD-10-CM | POA: Diagnosis not present

## 2023-05-31 DIAGNOSIS — Z8546 Personal history of malignant neoplasm of prostate: Secondary | ICD-10-CM | POA: Diagnosis not present

## 2023-05-31 DIAGNOSIS — E876 Hypokalemia: Secondary | ICD-10-CM | POA: Diagnosis present

## 2023-05-31 DIAGNOSIS — Z86718 Personal history of other venous thrombosis and embolism: Secondary | ICD-10-CM

## 2023-05-31 DIAGNOSIS — R54 Age-related physical debility: Secondary | ICD-10-CM | POA: Diagnosis present

## 2023-05-31 DIAGNOSIS — Z961 Presence of intraocular lens: Secondary | ICD-10-CM | POA: Diagnosis present

## 2023-05-31 DIAGNOSIS — Z981 Arthrodesis status: Secondary | ICD-10-CM

## 2023-05-31 DIAGNOSIS — R000 Tachycardia, unspecified: Secondary | ICD-10-CM | POA: Diagnosis not present

## 2023-05-31 HISTORY — DX: Repeated falls: R29.6

## 2023-05-31 LAB — CBC WITH DIFFERENTIAL/PLATELET
Abs Immature Granulocytes: 0.7 10*3/uL — ABNORMAL HIGH (ref 0.00–0.07)
Basophils Absolute: 0 10*3/uL (ref 0.0–0.1)
Basophils Relative: 0 %
Eosinophils Absolute: 0.2 10*3/uL (ref 0.0–0.5)
Eosinophils Relative: 1 %
HCT: 20.2 % — ABNORMAL LOW (ref 39.0–52.0)
Hemoglobin: 6.1 g/dL — CL (ref 13.0–17.0)
Lymphocytes Relative: 5 %
Lymphs Abs: 1.2 10*3/uL (ref 0.7–4.0)
MCH: 28.4 pg (ref 26.0–34.0)
MCHC: 30.2 g/dL (ref 30.0–36.0)
MCV: 94 fL (ref 80.0–100.0)
Metamyelocytes Relative: 1 %
Monocytes Absolute: 0.7 10*3/uL (ref 0.1–1.0)
Monocytes Relative: 3 %
Myelocytes: 2 %
Neutro Abs: 21.8 10*3/uL — ABNORMAL HIGH (ref 1.7–7.7)
Neutrophils Relative %: 88 %
Platelets: 478 10*3/uL — ABNORMAL HIGH (ref 150–400)
RBC: 2.15 MIL/uL — ABNORMAL LOW (ref 4.22–5.81)
RDW: 23 % — ABNORMAL HIGH (ref 11.5–15.5)
WBC: 24.8 10*3/uL — ABNORMAL HIGH (ref 4.0–10.5)
nRBC: 0.7 % — ABNORMAL HIGH (ref 0.0–0.2)
nRBC: 1 /100{WBCs} — ABNORMAL HIGH

## 2023-05-31 LAB — URINALYSIS, W/ REFLEX TO CULTURE (INFECTION SUSPECTED)
Glucose, UA: NEGATIVE mg/dL
Hgb urine dipstick: NEGATIVE
Ketones, ur: NEGATIVE mg/dL
Nitrite: NEGATIVE
Protein, ur: NEGATIVE mg/dL
Specific Gravity, Urine: 1.019 (ref 1.005–1.030)
pH: 5 (ref 5.0–8.0)

## 2023-05-31 LAB — BLOOD GAS, ARTERIAL
Acid-base deficit: 5 mmol/L — ABNORMAL HIGH (ref 0.0–2.0)
Bicarbonate: 18.4 mmol/L — ABNORMAL LOW (ref 20.0–28.0)
O2 Saturation: 100 %
Patient temperature: 37.7
pCO2 arterial: 30 mmHg — ABNORMAL LOW (ref 32–48)
pH, Arterial: 7.4 (ref 7.35–7.45)
pO2, Arterial: 198 mmHg — ABNORMAL HIGH (ref 83–108)

## 2023-05-31 LAB — RESP PANEL BY RT-PCR (RSV, FLU A&B, COVID)  RVPGX2
Influenza A by PCR: NEGATIVE
Influenza B by PCR: NEGATIVE
Resp Syncytial Virus by PCR: POSITIVE — AB
SARS Coronavirus 2 by RT PCR: NEGATIVE

## 2023-05-31 LAB — COMPREHENSIVE METABOLIC PANEL
ALT: 36 U/L (ref 0–44)
AST: 37 U/L (ref 15–41)
Albumin: 1.7 g/dL — ABNORMAL LOW (ref 3.5–5.0)
Alkaline Phosphatase: 120 U/L (ref 38–126)
Anion gap: 10 (ref 5–15)
BUN: 21 mg/dL (ref 8–23)
CO2: 23 mmol/L (ref 22–32)
Calcium: 8.1 mg/dL — ABNORMAL LOW (ref 8.9–10.3)
Chloride: 103 mmol/L (ref 98–111)
Creatinine, Ser: 0.78 mg/dL (ref 0.61–1.24)
GFR, Estimated: >60 mL/min (ref >60)
Glucose, Bld: 115 mg/dL — ABNORMAL HIGH (ref 70–99)
Potassium: 3.2 mmol/L — ABNORMAL LOW (ref 3.5–5.1)
Sodium: 136 mmol/L (ref 135–145)
Total Bilirubin: 3.4 mg/dL — ABNORMAL HIGH (ref 0.0–1.2)
Total Protein: 6.4 g/dL — ABNORMAL LOW (ref 6.5–8.1)

## 2023-05-31 LAB — APTT: aPTT: 43 s — ABNORMAL HIGH (ref 24–36)

## 2023-05-31 LAB — LACTATE DEHYDROGENASE: LDH: 135 U/L (ref 98–192)

## 2023-05-31 LAB — AMMONIA: Ammonia: 21 umol/L (ref 9–35)

## 2023-05-31 LAB — PREPARE RBC (CROSSMATCH)

## 2023-05-31 LAB — CBG MONITORING, ED: Glucose-Capillary: 133 mg/dL — ABNORMAL HIGH (ref 70–99)

## 2023-05-31 LAB — LACTIC ACID, PLASMA
Lactic Acid, Venous: 1.5 mmol/L (ref 0.5–1.9)
Lactic Acid, Venous: 1.5 mmol/L (ref 0.5–1.9)

## 2023-05-31 LAB — PROTIME-INR
INR: 1.8 — ABNORMAL HIGH (ref 0.8–1.2)
Prothrombin Time: 21.3 s — ABNORMAL HIGH (ref 11.4–15.2)

## 2023-05-31 LAB — POC OCCULT BLOOD, ED: Fecal Occult Bld: NEGATIVE

## 2023-05-31 MED ORDER — CHLORHEXIDINE GLUCONATE CLOTH 2 % EX PADS
6.0000 | MEDICATED_PAD | Freq: Every day | CUTANEOUS | Status: DC
  Administered 2023-05-31 – 2023-06-04 (×5): 6 via TOPICAL

## 2023-05-31 MED ORDER — ONDANSETRON HCL 4 MG/2ML IJ SOLN: 4.0000 mg | Freq: Four times a day (QID) | INTRAMUSCULAR | Status: DC | PRN

## 2023-05-31 MED ORDER — IPRATROPIUM-ALBUTEROL 0.5-2.5 (3) MG/3ML IN SOLN
10.0000 mL | Freq: Once | RESPIRATORY_TRACT | Status: DC
Start: 2023-05-31 — End: 2023-05-31

## 2023-05-31 MED ORDER — SODIUM CHLORIDE 0.9% IV SOLUTION: Freq: Once | INTRAVENOUS | Status: DC

## 2023-05-31 MED ORDER — SODIUM CHLORIDE 0.9 % IV BOLUS (SEPSIS)
1000.0000 mL | Freq: Once | INTRAVENOUS | Status: AC
  Administered 2023-05-31: 1000 mL via INTRAVENOUS

## 2023-05-31 MED ORDER — SODIUM CHLORIDE 0.9 % IV SOLN
2.0000 g | Freq: Once | INTRAVENOUS | Status: AC
  Administered 2023-05-31: 2 g via INTRAVENOUS
  Filled 2023-05-31: qty 20

## 2023-05-31 MED ORDER — ALBUTEROL SULFATE (2.5 MG/3ML) 0.083% IN NEBU
INHALATION_SOLUTION | RESPIRATORY_TRACT | Status: AC
  Administered 2023-05-31: 10 mg via RESPIRATORY_TRACT
  Filled 2023-05-31: qty 12

## 2023-05-31 MED ORDER — LATANOPROST 0.005 % OP SOLN
1.0000 [drp] | Freq: Every day | OPHTHALMIC | Status: DC
  Administered 2023-05-31 – 2023-06-05 (×6): 1 [drp] via OPHTHALMIC
  Filled 2023-05-31 (×2): qty 2.5

## 2023-05-31 MED ORDER — ENSURE ENLIVE PO LIQD
237.0000 mL | Freq: Two times a day (BID) | ORAL | Status: DC
  Administered 2023-06-01 – 2023-06-06 (×10): 237 mL via ORAL

## 2023-05-31 MED ORDER — IOHEXOL 350 MG/ML SOLN
100.0000 mL | Freq: Once | INTRAVENOUS | Status: AC | PRN
  Administered 2023-05-31: 100 mL via INTRAVENOUS

## 2023-05-31 MED ORDER — LACTATED RINGERS IV BOLUS: 1000.0000 mL | Freq: Once | INTRAVENOUS | Status: DC

## 2023-05-31 MED ORDER — METRONIDAZOLE 500 MG/100ML IV SOLN
500.0000 mg | Freq: Two times a day (BID) | INTRAVENOUS | Status: DC
  Administered 2023-05-31: 500 mg via INTRAVENOUS
  Filled 2023-05-31: qty 100

## 2023-05-31 MED ORDER — APIXABAN 5 MG PO TABS
5.0000 mg | ORAL_TABLET | Freq: Two times a day (BID) | ORAL | Status: DC
  Administered 2023-05-31 – 2023-06-03 (×6): 5 mg via ORAL
  Filled 2023-05-31 (×6): qty 1

## 2023-05-31 MED ORDER — METHYLPREDNISOLONE SODIUM SUCC 125 MG IJ SOLR
125.0000 mg | Freq: Once | INTRAMUSCULAR | Status: AC
  Administered 2023-05-31: 125 mg via INTRAVENOUS
  Filled 2023-05-31: qty 2

## 2023-05-31 MED ORDER — LACTATED RINGERS IV BOLUS (SEPSIS): 1000.0000 mL | Freq: Once | INTRAVENOUS | Status: DC

## 2023-05-31 MED ORDER — ORAL CARE MOUTH RINSE: 15.0000 mL | OROMUCOSAL | Status: DC | PRN

## 2023-05-31 MED ORDER — ALBUTEROL SULFATE (2.5 MG/3ML) 0.083% IN NEBU: 10.0000 mg | INHALATION_SOLUTION | Freq: Once | RESPIRATORY_TRACT | Status: AC

## 2023-05-31 MED ORDER — METHYLPREDNISOLONE SODIUM SUCC 125 MG IJ SOLR
120.0000 mg | Freq: Every day | INTRAMUSCULAR | Status: DC
  Administered 2023-06-01: 120 mg via INTRAVENOUS
  Filled 2023-05-31: qty 2

## 2023-05-31 MED ORDER — VANCOMYCIN HCL 1750 MG/350ML IV SOLN
1750.0000 mg | Freq: Once | INTRAVENOUS | Status: AC
  Administered 2023-05-31: 1750 mg via INTRAVENOUS
  Filled 2023-05-31: qty 350

## 2023-05-31 MED ORDER — VANCOMYCIN HCL 1750 MG/350ML IV SOLN
1750.0000 mg | INTRAVENOUS | Status: DC
  Administered 2023-06-01 – 2023-06-02 (×2): 1750 mg via INTRAVENOUS
  Filled 2023-05-31 (×2): qty 350

## 2023-05-31 MED ORDER — ALBUTEROL SULFATE (2.5 MG/3ML) 0.083% IN NEBU: 2.5000 mg | INHALATION_SOLUTION | RESPIRATORY_TRACT | Status: DC | PRN

## 2023-05-31 MED ORDER — SODIUM CHLORIDE 0.9 % IV SOLN: 2.0000 g | INTRAVENOUS | Status: DC

## 2023-05-31 MED ORDER — SODIUM CHLORIDE 0.9 % IV SOLN: INTRAVENOUS | Status: DC

## 2023-05-31 MED ORDER — LACTATED RINGERS IV SOLN: INTRAVENOUS | Status: DC

## 2023-05-31 MED ORDER — KETAMINE HCL 10 MG/ML IJ SOLN
INTRAMUSCULAR | Status: AC
Start: 2023-05-31 — End: 2023-05-31
  Administered 2023-05-31: 80 mg
  Filled 2023-05-31: qty 1

## 2023-05-31 MED ORDER — SODIUM CHLORIDE 0.9 % IV BOLUS
1000.0000 mL | Freq: Once | INTRAVENOUS | Status: AC
  Administered 2023-05-31: 1000 mL via INTRAVENOUS

## 2023-05-31 MED ORDER — TIOTROPIUM BROMIDE MONOHYDRATE 2.5 MCG/ACT IN AERS: 2.0000 | INHALATION_SPRAY | Freq: Every day | RESPIRATORY_TRACT | Status: DC

## 2023-05-31 MED ORDER — ONDANSETRON HCL 4 MG PO TABS: 4.0000 mg | ORAL_TABLET | Freq: Four times a day (QID) | ORAL | Status: DC | PRN

## 2023-05-31 MED ORDER — DOCUSATE SODIUM 100 MG PO CAPS
200.0000 mg | ORAL_CAPSULE | Freq: Every day | ORAL | Status: DC
  Administered 2023-06-01 – 2023-06-06 (×6): 200 mg via ORAL
  Filled 2023-05-31 (×6): qty 2

## 2023-05-31 MED ORDER — LORAZEPAM 2 MG/ML IJ SOLN
0.5000 mg | Freq: Once | INTRAMUSCULAR | Status: AC
  Administered 2023-05-31: 0.5 mg via INTRAVENOUS
  Filled 2023-05-31: qty 1

## 2023-05-31 MED ORDER — UMECLIDINIUM BROMIDE 62.5 MCG/ACT IN AEPB
1.0000 | INHALATION_SPRAY | Freq: Every day | RESPIRATORY_TRACT | Status: DC
  Administered 2023-06-02 – 2023-06-06 (×5): 1 via RESPIRATORY_TRACT
  Filled 2023-05-31 (×2): qty 7

## 2023-05-31 NOTE — ED Notes (Signed)
 Pt has become confused and grabbing at things in the air that are not visible. EDP aware and new orders received for RT to get a ABG. RT aware.

## 2023-05-31 NOTE — ED Triage Notes (Signed)
 Pt BIB [facility] from [facility] for c/o abnormal labs  The facility states pt had labwork done yesterday and the results today look like pt is still having septic readings  Pt is being treated for UTI and has been on 2 rounds of antibiotics  Pt has no complaints at this time

## 2023-05-31 NOTE — ED Provider Notes (Addendum)
 [place] EMERGENCY DEPARTMENT AT [facility] Provider Note   CSN: [id number] Arrival date & time: 05/31/23  1347     History Chief Complaint  Patient presents with   Abnormal Lab    Randy Russo is a 83 y.o. male.  Patient past history significant for COPD, hypertension, anemia presents the ED with concerns of abnormal labs.  Patient brought in from [facility] who reports concerns of abnormal labs that are concerning for possible sepsis.  Patient was always unsure what these lab results are but states that he is not having acute symptoms at this time.  He has been treated for a UTI and has been on treated antibiotics.  Patient himself denies any dysuria, hematuria, increased urinary frequency or urgency.  No labs are available.  Attempted to call to care facility for patient report but unable to get a hold of facility.  Given concern of possible urosepsis, will initiate with sepsis labs.  Code sepsis not activated at this time as patient is afebrile and heart rate is at 93.  Patient's blood pressure is a little low on intake but at room at bedside, patient is normotensive.  Doubtful of septic shock.   Abnormal Lab      Home Medications Prior to Admission medications   Medication Sig Start Date End Date Taking? Authorizing Provider  acetaminophen (TYLENOL) 325 MG tablet Take 650 mg by mouth every 6 (six) hours as needed for mild pain (pain score 1-3). Do not exceed 3,000 mg in 24 hours   Yes [provider]  albuterol (PROVENTIL) (2.5 MG/3ML) 0.083% nebulizer solution Take 3 mLs (2.5 mg total) by nebulization every 6 (six) hours as needed for wheezing or shortness of breath. 04/14/23  Yes Vassie Loll, MD  apixaban (ELIQUIS) 5 MG TABS tablet Take 2 tablets (10 mg total) by mouth 2 (two) times daily for 6 days, THEN 1 tablet (5 mg total) 2 (two) times daily. 04/25/23 05/31/23 Yes Shahmehdi, Gemma Payor, MD  B Complex-C (B-COMPLEX WITH VITAMIN C) tablet Take 1 tablet by  mouth daily. 04/15/23  Yes Vassie Loll, MD  Cholecalciferol 25 MCG (1000 UT) capsule Take 1 capsule (1,000 Units total) by mouth daily. 04/14/23  Yes Vassie Loll, MD  docusate sodium (COLACE) 100 MG capsule Take 2 capsules (200 mg total) by mouth daily. 04/25/23  Yes Shahmehdi, Gemma Payor, MD  ferrous sulfate 325 (65 FE) MG tablet Take 1 tablet (325 mg total) by mouth 2 (two) times daily with a meal. 04/25/23 06/24/23 Yes Shahmehdi, Seyed A, MD  furosemide (LASIX) 20 MG tablet Take 20 mg by mouth daily.   Yes [provider]  latanoprost (XALATAN) 0.005 % ophthalmic solution Place 1 drop into both eyes at bedtime.   Yes [provider]  loratadine (CLARITIN) 10 MG tablet Take 1 tablet (10 mg total) by mouth daily. 04/15/23  Yes Vassie Loll, MD  nitrofurantoin, macrocrystal-monohydrate, (MACROBID) 100 MG capsule Take 100 mg by mouth 2 (two) times daily. 05/25/23  Yes [provider]  Nutritional Supplements (NUTRITIONAL SUPPLEMENT PLUS) LIQD Take 120 mLs by mouth daily.   Yes [provider]  SPIRIVA RESPIMAT 2.5 MCG/ACT AERS Inhale 2 puffs into the lungs daily. 04/14/23  Yes [provider]      Allergies    Patient has no known allergies.    Review of Systems   Review of Systems  Constitutional:  Positive for fatigue.  All other systems reviewed and are negative.   Physical  Exam Updated Vital Signs BP (!) 107/49   Pulse (!) 104   Temp (!) 100.6 F (38.1 C) (Rectal)   Resp 14   Ht 5\' 6"  (1.676 m)   Wt 85.7 kg   SpO2 100%   BMI 30.49 kg/m  Physical Exam Vitals and nursing note reviewed.  Constitutional:      General: He is not in acute distress.    Appearance: He is well-developed. He is ill-appearing. He is not toxic-appearing or diaphoretic.  HENT:     Head: Normocephalic and atraumatic.  Eyes:     Conjunctiva/sclera: Conjunctivae normal.  Cardiovascular:     Rate and Rhythm: Normal rate and regular rhythm.     Heart sounds: No  murmur heard. Pulmonary:     Effort: Pulmonary effort is normal. No respiratory distress.     Breath sounds: Normal breath sounds.  Abdominal:     General: Bowel sounds are normal. There is no distension.     Palpations: Abdomen is soft.     Tenderness: There is no abdominal tenderness. There is no guarding.  Genitourinary:    Rectum: Normal.  Musculoskeletal:        General: No swelling.     Cervical back: Neck supple.  Skin:    General: Skin is warm and dry.     Capillary Refill: Capillary refill takes less than 2 seconds.  Neurological:     Mental Status: He is alert.  Psychiatric:        Mood and Affect: Mood normal.     ED Results / Procedures / Treatments   Labs (all labs ordered are listed, but only abnormal results are displayed) Labs Reviewed  COMPREHENSIVE METABOLIC PANEL - Abnormal; Notable for the following components:      Result Value   Potassium 3.2 (*)    Glucose, Bld 115 (*)    Calcium 8.1 (*)    Total Protein 6.4 (*)    Albumin 1.7 (*)    Total Bilirubin 3.4 (*)    All other components within normal limits  CBC WITH DIFFERENTIAL/PLATELET - Abnormal; Notable for the following components:   WBC 24.8 (*)    RBC 2.15 (*)    Hemoglobin 6.1 (*)    HCT 20.2 (*)    RDW 23.0 (*)    Platelets 478 (*)    nRBC 0.7 (*)    Neutro Abs 21.8 (*)    nRBC 1 (*)    Abs Immature Granulocytes 0.70 (*)    All other components within normal limits  PROTIME-INR - Abnormal; Notable for the following components:   Prothrombin Time 21.3 (*)    INR 1.8 (*)    All other components within normal limits  APTT - Abnormal; Notable for the following components:   aPTT 43 (*)    All other components within normal limits  URINALYSIS, W/ REFLEX TO CULTURE (INFECTION SUSPECTED) - Abnormal; Notable for the following components:   Color, Urine AMBER (*)    APPearance HAZY (*)    Bilirubin Urine SMALL (*)    Leukocytes,Ua MODERATE (*)    Bacteria, UA RARE (*)    All other  components within normal limits  BLOOD GAS, ARTERIAL - Abnormal; Notable for the following components:   pCO2 arterial 30 (*)    pO2, Arterial 198 (*)    Bicarbonate 18.4 (*)    Acid-base deficit 5.0 (*)    All other components within normal limits  CBG MONITORING, ED - Abnormal; Notable for the  following components:   Glucose-Capillary 133 (*)    All other components within normal limits  CULTURE, BLOOD (ROUTINE X 2)  CULTURE, BLOOD (ROUTINE X 2)  URINE CULTURE  RESP PANEL BY RT-PCR (RSV, FLU A&B, COVID)  RVPGX2  LACTIC ACID, PLASMA  LACTIC ACID, PLASMA  AMMONIA  POC OCCULT BLOOD, ED  TYPE AND SCREEN  PREPARE RBC (CROSSMATCH)    EKG EKG Interpretation Date/Time:  Saturday May 31 2023 17:43:08 EST Ventricular Rate:  148 PR Interval:  95 QRS Duration:  87 QT Interval:  288 QTC Calculation: 452 R Axis:   46  Text Interpretation: Supraventricular tachycardia Multiform ventricular premature complexes Low voltage, precordial leads Abnormal R-wave progression, early transition Artifact in lead(s) I II III aVR aVL V1 Confirmed by Eber Hong ([id number]) on 05/31/2023 6:07:27 PM  Radiology CT Angio Chest PE W and/or Wo Contrast Result Date: 05/31/2023 CLINICAL DATA:  Sepsis, altered mental status history of prostate cancer * Tracking Code: BO * EXAM: CT ANGIOGRAPHY CHEST CT ABDOMEN AND PELVIS WITH CONTRAST TECHNIQUE: Multidetector CT imaging of the chest was performed using the standard protocol during bolus administration of intravenous contrast. Multiplanar CT image reconstructions and MIPs were obtained to evaluate the vascular anatomy. Multidetector CT imaging of the abdomen and pelvis was performed using the standard protocol during bolus administration of intravenous contrast. RADIATION DOSE REDUCTION: This exam was performed according to the departmental dose-optimization program which includes automated exposure control, adjustment of the mA and/or kV according to patient size and/or  use of iterative reconstruction technique. CONTRAST:  OMNIPAQUE IOHEXOL 350 MG/ML SOLN COMPARISON:  CT abdomen pelvis, 04/22/2023 FINDINGS: CT CHEST ANGIOGRAM FINDINGS Cardiovascular: Examination limited by breath motion artifact. Within this limitation, no central or proximal segmental pulmonary embolus. Evaluation of more distal vessels is nondiagnostic. Normal heart size. No pericardial effusion. Mediastinum/Nodes: No enlarged mediastinal, hilar, or axillary lymph nodes. Thyroid gland, trachea, and esophagus demonstrate no significant findings. Lungs/Pleura: Bibasilar scarring or atelectasis. Evaluation of the lung parenchyma significantly limited by breath motion artifact. No pleural effusion or pneumothorax. Musculoskeletal: No chest wall abnormality. No acute osseous findings. Review of the MIP images confirms the above findings. CT ABDOMEN PELVIS FINDINGS Evaluation limited by breath motion artifact. Hepatobiliary: No solid liver abnormality is seen. No gallstones, gallbladder wall thickening, or biliary dilatation. Pancreas: Unremarkable. No pancreatic ductal dilatation or surrounding inflammatory changes. Spleen: Normal in size without significant abnormality. Adrenals/Urinary Tract: Adrenal glands are unremarkable. Simple, benign bilateral renal cortical cysts, including a large exophytic cyst arising from the anterior midportion of the right kidney. No obvious calculi. No hydronephrosis. Bladder is unremarkable. Stomach/Bowel: Stomach is within normal limits. Appendix appears normal. No evidence of bowel wall thickening, distention, or inflammatory changes. Descending and sigmoid diverticulosis Vascular/Lymphatic: Previously noted portal venous thrombus not clearly seen on today's examination (series 6, image 30). No enlarged abdominal or pelvic lymph nodes. Reproductive: Biopsy marking clips or fiducials in the expected vicinity of the prostate, assessment of the low pelvis limited by motion and  right hip arthroplasty. Other: Midline ventral hernia containing nonobstructed mid small bowel (series 2, image 54). No ascites. Musculoskeletal: Status post right hip total arthroplasty. IMPRESSION: 1. Examination pulmonary embolism significantly limited by breath motion artifact. Within this limitation, no central or proximal segmental pulmonary embolus. 2. No acute CT findings of the chest, abdomen, or pelvis to explain sepsis within significant limitations of breath motion, patient motion, and streak artifact. 3. Descending and sigmoid diverticulosis without evidence of acute diverticulitis. 4. Midline ventral  hernia containing nonobstructed mid small bowel. 5. Previously noted portal venous thrombus is not appreciated on today's examination and presumably resolved. Electronically Signed   By: Jearld Lesch M.D.   On: 05/31/2023 19:16   CT ABDOMEN PELVIS W CONTRAST Result Date: 05/31/2023 CLINICAL DATA:  Sepsis, altered mental status history of prostate cancer * Tracking Code: BO * EXAM: CT ANGIOGRAPHY CHEST CT ABDOMEN AND PELVIS WITH CONTRAST TECHNIQUE: Multidetector CT imaging of the chest was performed using the standard protocol during bolus administration of intravenous contrast. Multiplanar CT image reconstructions and MIPs were obtained to evaluate the vascular anatomy. Multidetector CT imaging of the abdomen and pelvis was performed using the standard protocol during bolus administration of intravenous contrast. RADIATION DOSE REDUCTION: This exam was performed according to the departmental dose-optimization program which includes automated exposure control, adjustment of the mA and/or kV according to patient size and/or use of iterative reconstruction technique. CONTRAST:  OMNIPAQUE IOHEXOL 350 MG/ML SOLN COMPARISON:  CT abdomen pelvis, 04/22/2023 FINDINGS: CT CHEST ANGIOGRAM FINDINGS Cardiovascular: Examination limited by breath motion artifact. Within this limitation, no central or proximal  segmental pulmonary embolus. Evaluation of more distal vessels is nondiagnostic. Normal heart size. No pericardial effusion. Mediastinum/Nodes: No enlarged mediastinal, hilar, or axillary lymph nodes. Thyroid gland, trachea, and esophagus demonstrate no significant findings. Lungs/Pleura: Bibasilar scarring or atelectasis. Evaluation of the lung parenchyma significantly limited by breath motion artifact. No pleural effusion or pneumothorax. Musculoskeletal: No chest wall abnormality. No acute osseous findings. Review of the MIP images confirms the above findings. CT ABDOMEN PELVIS FINDINGS Evaluation limited by breath motion artifact. Hepatobiliary: No solid liver abnormality is seen. No gallstones, gallbladder wall thickening, or biliary dilatation. Pancreas: Unremarkable. No pancreatic ductal dilatation or surrounding inflammatory changes. Spleen: Normal in size without significant abnormality. Adrenals/Urinary Tract: Adrenal glands are unremarkable. Simple, benign bilateral renal cortical cysts, including a large exophytic cyst arising from the anterior midportion of the right kidney. No obvious calculi. No hydronephrosis. Bladder is unremarkable. Stomach/Bowel: Stomach is within normal limits. Appendix appears normal. No evidence of bowel wall thickening, distention, or inflammatory changes. Descending and sigmoid diverticulosis Vascular/Lymphatic: Previously noted portal venous thrombus not clearly seen on today's examination (series 6, image 30). No enlarged abdominal or pelvic lymph nodes. Reproductive: Biopsy marking clips or fiducials in the expected vicinity of the prostate, assessment of the low pelvis limited by motion and right hip arthroplasty. Other: Midline ventral hernia containing nonobstructed mid small bowel (series 2, image 54). No ascites. Musculoskeletal: Status post right hip total arthroplasty. IMPRESSION: 1. Examination pulmonary embolism significantly limited by breath motion artifact.  Within this limitation, no central or proximal segmental pulmonary embolus. 2. No acute CT findings of the chest, abdomen, or pelvis to explain sepsis within significant limitations of breath motion, patient motion, and streak artifact. 3. Descending and sigmoid diverticulosis without evidence of acute diverticulitis. 4. Midline ventral hernia containing nonobstructed mid small bowel. 5. Previously noted portal venous thrombus is not appreciated on today's examination and presumably resolved. Electronically Signed   By: Jearld Lesch M.D.   On: 05/31/2023 19:16   DG Chest Port 1 View Result Date: 05/31/2023 CLINICAL DATA:  Possible sepsis. EXAM: PORTABLE CHEST 1 VIEW COMPARISON:  April 22, 2023. FINDINGS: Stable cardiomediastinal silhouette. Both lungs are clear. The visualized skeletal structures are unremarkable. IMPRESSION: No active disease. Electronically Signed   By: Lupita Raider M.D.   On: 05/31/2023 15:42    Procedures .Critical Care  Performed by: Smitty Knudsen, PA-C Authorized  by: Smitty Knudsen, PA-C   Critical care provider statement:    Critical care time (minutes):  75   Critical care start time:  05/31/2023 5:15 PM   Critical care end time:  05/31/2023 6:30 PM   Critical care time was exclusive of:  Separately billable procedures and treating other patients   Critical care was necessary to treat or prevent imminent or life-threatening deterioration of the following conditions:  Sepsis   Critical care was time spent personally by me on the following activities:  Development of treatment plan with patient or surrogate, discussions with consultants, evaluation of patient's response to treatment, examination of patient, ordering and review of radiographic studies, ordering and review of laboratory studies and ordering and performing treatments and interventions   I assumed direction of critical care for this patient from another provider in my specialty: no     Care discussed with:  admitting provider       Medications Ordered in ED Medications  0.9 %  sodium chloride infusion ( Intravenous New Bag/Given 05/31/23 1931)  0.9 %  sodium chloride infusion (Manually program via Guardrails IV Fluids) (0 mLs Intravenous Hold 05/31/23 1903)  metroNIDAZOLE (FLAGYL) IVPB 500 mg (500 mg Intravenous New Bag/Given 05/31/23 1935)  vancomycin (VANCOREADY) IVPB 1750 mg/350 mL (1,750 mg Intravenous New Bag/Given 05/31/23 1935)  vancomycin (VANCOREADY) IVPB 1750 mg/350 mL (has no administration in time range)  cefTRIAXone (ROCEPHIN) 2 g in sodium chloride 0.9 % 100 mL IVPB (0 g Intravenous Stopped 05/31/23 1647)  sodium chloride 0.9 % bolus 1,000 mL (0 mLs Intravenous Stopped 05/31/23 1926)  sodium chloride 0.9 % bolus 1,000 mL (0 mLs Intravenous Stopped 05/31/23 1926)    And  sodium chloride 0.9 % bolus 1,000 mL (0 mLs Intravenous Stopped 05/31/23 1926)    And  sodium chloride 0.9 % bolus 1,000 mL (1,000 mLs Intravenous New Bag/Given 05/31/23 1906)  LORazepam (ATIVAN) injection 0.5 mg (0.5 mg Intravenous Given 05/31/23 1736)  ketamine (KETALAR) 10 MG/ML injection (80 mg  Given 05/31/23 1829)  iohexol (OMNIPAQUE) 350 MG/ML injection 100 mL (100 mLs Intravenous Contrast Given 05/31/23 1841)  albuterol (PROVENTIL) (2.5 MG/3ML) 0.083% nebulizer solution 10 mg (10 mg Nebulization Given 05/31/23 1904)    ED Course/ Medical Decision Making/ A&P                                  Medical Decision Making Amount and/or Complexity of Data Reviewed Labs: ordered. Decision-making details documented in ED Course. Radiology: ordered.  Risk Prescription drug management. Decision regarding hospitalization.   This patient presents to the ED for concern of abnormal labs.  Differential diagnosis includes sepsis, urosepsis, GI bleed, URI   Lab Tests:  I Ordered, and personally interpreted labs.  The pertinent results include: CBC with white count at 24.8, hemoglobin down to 6.1 concerning for possible GI bleed,  lactic normal at 1.5, CMP unremarkable   Imaging Studies ordered:  I ordered imaging studies including chest x-ray I independently visualized and interpreted imaging which showed no acute cardiopulmonary findings I agree with the radiologist interpretation   Medicines ordered and prescription drug management:  I ordered medication including fluids, Rocephin, blood products for sepsis, UTI, hemoglobin less than 7 Reevaluation of the patient after these medicines showed that the patient improved I have reviewed the patients home medicines and have made adjustments as needed   Problem List / ED Course:  Patient with past  history significant for COPD, hypertension, GI bleed, GERD here with concerns of abnormal labs.  Patient brought in by EMS from [facility] with some concerns about abnormal labs.  Patient has reportedly been treated for UTI and is on his second round of antibiotics.  Patient at this time does not have any specific concerns.  Attempted to contact the care facility but was unable to get a hold of patient's nursing staff there.  Unclear what the exact lab concerns were.  Given patient having borderline hypotension but no other signs indicating SIRS, will initiate sepsis workup.  Will hold off on starting antibiotics until labs result.  If patient is acutely septic, suspicion at this time would be for urosepsis given reported concerns for UTI not responding to antibiotics. Patient with concerning labs showing white count of 24.8.  Hemoglobin down to 6.1 which is a considerable decline given normal hemoglobin of 10.8 3 weeks ago.  Patient denies any hematemesis, medic easy, melanotic stools, or other concerns for GI bleed.  Patient is on Eliquis.  Given elevated white count, will start antibiotics and initiate code sepsis. Concern for GI bleed and will obtain hemoccult. Discussed need for blood transfusion with patient and he verbalized agreement for this. Patient with improvement  in blood pressure after fluid boluses.  Blood products still pending to be administered.  UA with evidence of infection with moderate leukocytes and rare bacteria seen.  Will send off for culture.  Received verbal confirmation of patient's Hemoccult is negative.  Result not crossing over to chart at this time. Will place consult to hospitalist for admission. Informed attending, Dr. Hyacinth Meeker, regarding patient condition and plan for admission at this time. Around 5:30PM patient had notable change in mental status. Unable to follow commands and some increasing agitation. No history of dementia on chart and granddaughter states not history of dementia or similar change in behavior like this. Spoke with Dr. Adrian Blackwater, hospitalist, who will evaluate and admit patient. Unclear source of increased agitation and confusion with patient that may ultimately necessitate higher level of care. CTAP and CT angio negative for any acute findings. Motion degraded but no clear source of patient's change in mental status. Spoke with granddaughter, Rinaldo Cloud, who states this is considerable change from baseline but appears that some level of decline started 2 weeks ago around the time the initial UTI was diagnosed. Granddaughter reiterated that patient is DNR and DNI. Proceeding with admission at this time.   Final Clinical Impression(s) / ED Diagnoses Final diagnoses:  Sepsis without acute organ dysfunction, due to unspecified organism [facility])  Hypokalemia  Symptomatic anemia    Rx / DC Orders ED Discharge Orders     None         Smitty Knudsen, PA-C 05/31/23 2039    Smitty Knudsen, PA-C 05/31/23 2040    Eber Hong, MD 05/31/23 2043

## 2023-05-31 NOTE — Sepsis Progress Note (Signed)
 Following for sepsis monitoring ?

## 2023-05-31 NOTE — H&P (Signed)
 History and Physical    Patient: Randy Russo KGM:[id number] DOB: 06-Sep-1940 DOA: 05/31/2023 DOS: the patient was seen and examined on 05/31/2023 PCP: Assunta Found, MD  Patient coming from: SNF  Chief Complaint:  Chief Complaint  Patient presents with   Abnormal Lab   HPI: Randy Russo is a 83 y.o. 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  Patient unable to give history due to encephalopathy.  Patient is a resident of a skilled nursing facility he was brought here due to "his sepsis labs being abnormal".  Here, the patient was initially fairly lucid.  UA showed a bladder infection, he was started on Rocephin.  CBC showed anemia but his rectal exam was negative.  Shortly after his labs returned, he had an abrupt decline and became encephalopathic.  He also became tachycardic and tachypneic and quite agitated.  He was given ketamine with good effect.  CT of chest and CT of abdomen pelvis were done and were negative for acute infection.  Due to his anemia, a blood transfusion was started.  Review of Systems: As mentioned in the history of present illness. All other systems reviewed and are negative. Past Medical History:  Diagnosis Date   Arthritis    Cancer [facility])    Prostate   COPD (chronic obstructive pulmonary disease) (HCC)    Diverticulitis    Falls frequently    Hypertension    Lower GI bleed 12/27/2017   Past Surgical History:  Procedure Laterality Date   CATARACT EXTRACTION W/PHACO Left 11/16/2012   Procedure: CATARACT EXTRACTION PHACO AND INTRAOCULAR LENS PLACEMENT (IOC);  Surgeon: Gemma Payor, MD;  Location: [facility];  Service: Ophthalmology;  Laterality: Left;  CDE: 13.66   CATARACT EXTRACTION W/PHACO Right 01/18/2013   Procedure: CATARACT EXTRACTION PHACO AND INTRAOCULAR LENS PLACEMENT (IOC);  Surgeon:  Gemma Payor, MD;  Location: [facility];  Service: Ophthalmology;  Laterality: Right;  CDE:8.79   CERVICAL FUSION  4 yrs ago   COLONOSCOPY N/A 12/28/2017   Procedure: COLONOSCOPY;  Surgeon: Malissa Hippo, MD;  Location: [facility];  Service: Endoscopy;  Laterality: N/A;   COLONOSCOPY N/A 01/01/2018   Procedure: COLONOSCOPY;  Surgeon: Malissa Hippo, MD;  Location: [facility];  Service: Endoscopy;  Laterality: N/A;   ESOPHAGOGASTRODUODENOSCOPY N/A 01/01/2018   Procedure: ESOPHAGOGASTRODUODENOSCOPY (EGD);  Surgeon: Malissa Hippo, MD;  [facility];  Service: Endoscopy;  Laterality: N/A;   INGUINAL HERNIA REPAIR Right 03/12/2021   Procedure: HERNIA REPAIR INGUINAL ADULT;  Surgeon: Franky Macho, MD;  Location: [facility];  Service: General;  Laterality: Right;   JOINT REPLACEMENT Left 5 yrs ago   knee   PARTIAL COLECTOMY N/A 01/01/2018   Procedure: PARTIAL COLECTOMY;  Surgeon: Franky Macho, MD;  Location: [facility];  Service: General;  Laterality: N/A;   right hip replacement Right 3 yrs ago   Social History:  reports that he has quit smoking. His smoking use included cigars. He has never used smokeless tobacco. He reports that he does not drink alcohol and does not use drugs.  No Known Allergies  Family History  Problem Relation Age of Onset   Diabetes Mother    Dementia Father     Prior to Admission medications   Medication Sig Start Date End Date Taking? Authorizing Provider  acetaminophen (TYLENOL) 325 MG tablet Take 650 mg by mouth every 6 (  six) hours as needed for mild pain (pain score 1-3). Do not exceed 3,000 mg in 24 hours   Yes [provider]  albuterol (PROVENTIL) (2.5 MG/3ML) 0.083% nebulizer solution Take 3 mLs (2.5 mg total) by nebulization every 6 (six) hours as needed for wheezing or shortness of breath. 04/14/23  Yes Vassie Loll, MD  apixaban (ELIQUIS) 5 MG TABS tablet Take 2 tablets (10 mg total) by mouth 2 (two) times daily for 6 days, THEN 1  tablet (5 mg total) 2 (two) times daily. 04/25/23 05/31/23 Yes Shahmehdi, Gemma Payor, MD  B Complex-C (B-COMPLEX WITH VITAMIN C) tablet Take 1 tablet by mouth daily. 04/15/23  Yes Vassie Loll, MD  Cholecalciferol 25 MCG (1000 UT) capsule Take 1 capsule (1,000 Units total) by mouth daily. 04/14/23  Yes Vassie Loll, MD  docusate sodium (COLACE) 100 MG capsule Take 2 capsules (200 mg total) by mouth daily. 04/25/23  Yes Shahmehdi, Gemma Payor, MD  ferrous sulfate 325 (65 FE) MG tablet Take 1 tablet (325 mg total) by mouth 2 (two) times daily with a meal. 04/25/23 06/24/23 Yes Shahmehdi, Seyed A, MD  furosemide (LASIX) 20 MG tablet Take 20 mg by mouth daily.   Yes [provider]  latanoprost (XALATAN) 0.005 % ophthalmic solution Place 1 drop into both eyes at bedtime.   Yes [provider]  loratadine (CLARITIN) 10 MG tablet Take 1 tablet (10 mg total) by mouth daily. 04/15/23  Yes Vassie Loll, MD  nitrofurantoin, macrocrystal-monohydrate, (MACROBID) 100 MG capsule Take 100 mg by mouth 2 (two) times daily. 05/25/23  Yes [provider]  Nutritional Supplements (NUTRITIONAL SUPPLEMENT PLUS) LIQD Take 120 mLs by mouth daily.   Yes [provider]  SPIRIVA RESPIMAT 2.5 MCG/ACT AERS Inhale 2 puffs into the lungs daily. 04/14/23  Yes [provider]    Physical Exam: Vitals:   05/31/23 1913 05/31/23 1915 05/31/23 1930 05/31/23 1951  BP: (!) 119/53 (!) 132/42 (!) 105/52   Pulse: (!) 114 (!) 114 (!) 119   Resp: (!) 33 (!) 28 19   Temp: 99.5 F (37.5 C)   (!) 100.6 F (38.1 C)  TempSrc: Axillary   Rectal  SpO2: 100% 100% 100%   Weight:      Height:       General: elderly male. somnolent. No acute cardiopulmonary distress.  HEENT: Normocephalic atraumatic.  Right and left ears normal in appearance.  Pupils equal, round, reactive to light. Extraocular muscles are intact. Sclerae anicteric and noninjected.  Moist mucosal membranes. No mucosal lesions.  Neck: Neck  supple without lymphadenopathy. No carotid bruits. No masses palpated.  Cardiovascular: tachycardia rate with normal S1-S2 sounds. No murmurs, rubs, gallops auscultated. No JVD.  Respiratory: Good respiratory effort with no wheezes, rales, rhonchi. Lungs clear to auscultation bilaterally.  No accessory muscle use. Abdomen: Soft, nontender, nondistended. Active bowel sounds. No masses or hepatosplenomegaly  Skin: No rashes, lesions, or ulcerations.  Dry, warm to touch. 2+ dorsalis pedis and radial pulses. Musculoskeletal: No calf or leg pain. All major joints not erythematous nontender.  No upper or lower joint deformation.  Good ROM.  No contractures  Psychiatric: Intact judgment and insight. Pleasant and cooperative. Neurologic: No focal neurological deficits. Strength is 5/5 and symmetric in upper and lower extremities.  Cranial nerves II through XII are grossly intact.  Data Reviewed: Results for orders placed or performed during the hospital encounter of 05/31/23 (from the past 24 hours)  Lactic acid, plasma     Status:  None   Collection Time: 05/31/23  2:58 PM  Result Value Ref Range   Lactic Acid, Venous 1.5 0.5 - 1.9 mmol/L  Comprehensive metabolic panel     Status: Abnormal   Collection Time: 05/31/23  2:58 PM  Result Value Ref Range   Sodium 136 135 - 145 mmol/L   Potassium 3.2 (L) 3.5 - 5.1 mmol/L   Chloride 103 98 - 111 mmol/L   CO2 23 22 - 32 mmol/L   Glucose, Bld 115 (H) 70 - 99 mg/dL   BUN 21 8 - 23 mg/dL   Creatinine, Ser 1.91 0.61 - 1.24 mg/dL   Calcium 8.1 (L) 8.9 - 10.3 mg/dL   Total Protein 6.4 (L) 6.5 - 8.1 g/dL   Albumin 1.7 (L) 3.5 - 5.0 g/dL   AST 37 15 - 41 U/L   ALT 36 0 - 44 U/L   Alkaline Phosphatase 120 38 - 126 U/L   Total Bilirubin 3.4 (H) 0.0 - 1.2 mg/dL   GFR, Estimated >47 >82 mL/min   Anion gap 10 5 - 15  CBC with Differential     Status: Abnormal   Collection Time: 05/31/23  2:58 PM  Result Value Ref Range   WBC 24.8 (H) 4.0 - 10.5 K/uL   RBC  2.15 (L) 4.22 - 5.81 MIL/uL   Hemoglobin 6.1 (LL) 13.0 - 17.0 g/dL   HCT 95.6 (L) 21.3 - 08.6 %   MCV 94.0 80.0 - 100.0 fL   MCH 28.4 26.0 - 34.0 pg   MCHC 30.2 30.0 - 36.0 g/dL   RDW 57.8 (H) 46.9 - 62.9 %   Platelets 478 (H) 150 - 400 K/uL   nRBC 0.7 (H) 0.0 - 0.2 %   Neutrophils Relative % 88 %   Neutro Abs 21.8 (H) 1.7 - 7.7 K/uL   Lymphocytes Relative 5 %   Lymphs Abs 1.2 0.7 - 4.0 K/uL   Monocytes Relative 3 %   Monocytes Absolute 0.7 0.1 - 1.0 K/uL   Eosinophils Relative 1 %   Eosinophils Absolute 0.2 0.0 - 0.5 K/uL   Basophils Relative 0 %   Basophils Absolute 0.0 0.0 - 0.1 K/uL   WBC Morphology Mild Left Shift (1-5% metas, occ myelo)    RBC Morphology ANISOCYTOSIS    Smear Review MORPHOLOGY UNREMARKABLE    nRBC 1 (H) 0 /100 WBC   Metamyelocytes Relative 1 %   Myelocytes 2 %   Abs Immature Granulocytes 0.70 (H) 0.00 - 0.07 K/uL   Polychromasia PRESENT   Protime-INR     Status: Abnormal   Collection Time: 05/31/23  2:58 PM  Result Value Ref Range   Prothrombin Time 21.3 (H) 11.4 - 15.2 seconds   INR 1.8 (H) 0.8 - 1.2  APTT     Status: Abnormal   Collection Time: 05/31/23  2:58 PM  Result Value Ref Range   aPTT 43 (H) 24 - 36 seconds  Blood Culture (routine x 2)     Status: None (Preliminary result)   Collection Time: 05/31/23  2:58 PM   Specimen: Vein; Blood  Result Value Ref Range   Specimen Description      RIGHT ANTECUBITAL BOTTLES DRAWN AEROBIC AND ANAEROBIC   Special Requests      Blood Culture adequate volume Performed at [facility], [address] [place], [place] [address]    Culture PENDING    Report Status PENDING   Blood Culture (routine x 2)     Status: None (Preliminary result)  Collection Time: 05/31/23  2:58 PM   Specimen: Vein; Blood  Result Value Ref Range   Specimen Description      BLOOD RIGHT HAND BOTTLES DRAWN AEROBIC AND ANAEROBIC   Special Requests      Blood Culture adequate volume Performed at [facility], [address], [place], [place] [address]    Culture PENDING    Report Status PENDING   Urinalysis, w/ Reflex to Culture (Infection Suspected) -Urine, Catheterized     Status: Abnormal   Collection Time: 05/31/23  4:18 PM  Result Value Ref Range   Specimen Source URINE, CATHETERIZED    Color, Urine AMBER (A) YELLOW   APPearance HAZY (A) CLEAR   Specific Gravity, Urine 1.019 1.005 - 1.030   pH 5.0 5.0 - 8.0   Glucose, UA NEGATIVE NEGATIVE mg/dL   Hgb urine dipstick NEGATIVE NEGATIVE   Bilirubin Urine SMALL (A) NEGATIVE   Ketones, ur NEGATIVE NEGATIVE mg/dL   Protein, ur NEGATIVE NEGATIVE mg/dL   Nitrite NEGATIVE NEGATIVE   Leukocytes,Ua MODERATE (A) NEGATIVE   RBC / HPF 6-10 0 - 5 RBC/hpf   WBC, UA 21-50 0 - 5 WBC/hpf   Bacteria, UA RARE (A) NONE SEEN   Squamous Epithelial / HPF 0-5 0 - 5 /HPF   Mucus PRESENT    Budding Yeast PRESENT   Lactic acid, plasma     Status: None   Collection Time: 05/31/23  4:48 PM  Result Value Ref Range   Lactic Acid, Venous 1.5 0.5 - 1.9 mmol/L  Type and screen [facility]     Status: None (Preliminary result)   Collection Time: 05/31/23  4:48 PM  Result Value Ref Range   ABO/RH(D) B POS    Antibody Screen NEG    Sample Expiration 06/03/2023,2359    Unit Number [id number]    Blood Component Type RED CELLS,LR    Unit division 00    Status of Unit ISSUED    Transfusion Status OK TO TRANSFUSE    Crossmatch Result      Compatible Performed at [facility], [address], [place], [place] [address]   Prepare RBC (crossmatch)     Status: None   Collection Time: 05/31/23  4:48 PM  Result Value Ref Range   Order Confirmation      ORDER PROCESSED BY BLOOD BANK Performed at [facility], [address], [place], [place] [address]   POC occult blood, ED Provider will collect     Status: None   Collection Time: 05/31/23  6:04 PM  Result Value Ref Range   Fecal Occult Bld NEGATIVE NEGATIVE  POC CBG, ED     Status: Abnormal   Collection Time: 05/31/23   6:20 PM  Result Value Ref Range   Glucose-Capillary 133 (H) 70 - 99 mg/dL  Blood gas, arterial (at [facility])     Status: Abnormal   Collection Time: 05/31/23  6:30 PM  Result Value Ref Range   pH, Arterial 7.4 7.35 - 7.45   pCO2 arterial 30 (L) 32 - 48 mmHg   pO2, Arterial 198 (H) 83 - 108 mmHg   Bicarbonate 18.4 (L) 20.0 - 28.0 mmol/L   Acid-base deficit 5.0 (H) 0.0 - 2.0 mmol/L   O2 Saturation 100 %   Patient temperature 37.7    Collection site RIGHT RADIAL    Drawn by DR.MILLER    Allens test (pass/fail) PASS PASS  Ammonia     Status: None   Collection Time:  05/31/23  6:58 PM  Result Value Ref Range   Ammonia 21 9 - 35 umol/L    CT Angio Chest PE W and/or Wo Contrast Result Date: 05/31/2023 CLINICAL DATA:  Sepsis, altered mental status history of prostate cancer * Tracking Code: BO * EXAM: CT ANGIOGRAPHY CHEST CT ABDOMEN AND PELVIS WITH CONTRAST TECHNIQUE: Multidetector CT imaging of the chest was performed using the standard protocol during bolus administration of intravenous contrast. Multiplanar CT image reconstructions and MIPs were obtained to evaluate the vascular anatomy. Multidetector CT imaging of the abdomen and pelvis was performed using the standard protocol during bolus administration of intravenous contrast. RADIATION DOSE REDUCTION: This exam was performed according to the departmental dose-optimization program which includes automated exposure control, adjustment of the mA and/or kV according to patient size and/or use of iterative reconstruction technique. CONTRAST:  OMNIPAQUE IOHEXOL 350 MG/ML SOLN COMPARISON:  CT abdomen pelvis, 04/22/2023 FINDINGS: CT CHEST ANGIOGRAM FINDINGS Cardiovascular: Examination limited by breath motion artifact. Within this limitation, no central or proximal segmental pulmonary embolus. Evaluation of more distal vessels is nondiagnostic. Normal heart size. No pericardial effusion. Mediastinum/Nodes: No enlarged mediastinal, hilar, or  axillary lymph nodes. Thyroid gland, trachea, and esophagus demonstrate no significant findings. Lungs/Pleura: Bibasilar scarring or atelectasis. Evaluation of the lung parenchyma significantly limited by breath motion artifact. No pleural effusion or pneumothorax. Musculoskeletal: No chest wall abnormality. No acute osseous findings. Review of the MIP images confirms the above findings. CT ABDOMEN PELVIS FINDINGS Evaluation limited by breath motion artifact. Hepatobiliary: No solid liver abnormality is seen. No gallstones, gallbladder wall thickening, or biliary dilatation. Pancreas: Unremarkable. No pancreatic ductal dilatation or surrounding inflammatory changes. Spleen: Normal in size without significant abnormality. Adrenals/Urinary Tract: Adrenal glands are unremarkable. Simple, benign bilateral renal cortical cysts, including a large exophytic cyst arising from the anterior midportion of the right kidney. No obvious calculi. No hydronephrosis. Bladder is unremarkable. Stomach/Bowel: Stomach is within normal limits. Appendix appears normal. No evidence of bowel wall thickening, distention, or inflammatory changes. Descending and sigmoid diverticulosis Vascular/Lymphatic: Previously noted portal venous thrombus not clearly seen on today's examination (series 6, image 30). No enlarged abdominal or pelvic lymph nodes. Reproductive: Biopsy marking clips or fiducials in the expected vicinity of the prostate, assessment of the low pelvis limited by motion and right hip arthroplasty. Other: Midline ventral hernia containing nonobstructed mid small bowel (series 2, image 54). No ascites. Musculoskeletal: Status post right hip total arthroplasty. IMPRESSION: 1. Examination pulmonary embolism significantly limited by breath motion artifact. Within this limitation, no central or proximal segmental pulmonary embolus. 2. No acute CT findings of the chest, abdomen, or pelvis to explain sepsis within significant limitations  of breath motion, patient motion, and streak artifact. 3. Descending and sigmoid diverticulosis without evidence of acute diverticulitis. 4. Midline ventral hernia containing nonobstructed mid small bowel. 5. Previously noted portal venous thrombus is not appreciated on today's examination and presumably resolved. Electronically Signed   By: Jearld Lesch M.D.   On: 05/31/2023 19:16   CT ABDOMEN PELVIS W CONTRAST Result Date: 05/31/2023 CLINICAL DATA:  Sepsis, altered mental status history of prostate cancer * Tracking Code: BO * EXAM: CT ANGIOGRAPHY CHEST CT ABDOMEN AND PELVIS WITH CONTRAST TECHNIQUE: Multidetector CT imaging of the chest was performed using the standard protocol during bolus administration of intravenous contrast. Multiplanar CT image reconstructions and MIPs were obtained to evaluate the vascular anatomy. Multidetector CT imaging of the abdomen and pelvis was performed using the standard protocol during bolus administration of  intravenous contrast. RADIATION DOSE REDUCTION: This exam was performed according to the departmental dose-optimization program which includes automated exposure control, adjustment of the mA and/or kV according to patient size and/or use of iterative reconstruction technique. CONTRAST:  OMNIPAQUE IOHEXOL 350 MG/ML SOLN COMPARISON:  CT abdomen pelvis, 04/22/2023 FINDINGS: CT CHEST ANGIOGRAM FINDINGS Cardiovascular: Examination limited by breath motion artifact. Within this limitation, no central or proximal segmental pulmonary embolus. Evaluation of more distal vessels is nondiagnostic. Normal heart size. No pericardial effusion. Mediastinum/Nodes: No enlarged mediastinal, hilar, or axillary lymph nodes. Thyroid gland, trachea, and esophagus demonstrate no significant findings. Lungs/Pleura: Bibasilar scarring or atelectasis. Evaluation of the lung parenchyma significantly limited by breath motion artifact. No pleural effusion or pneumothorax. Musculoskeletal: No  chest wall abnormality. No acute osseous findings. Review of the MIP images confirms the above findings. CT ABDOMEN PELVIS FINDINGS Evaluation limited by breath motion artifact. Hepatobiliary: No solid liver abnormality is seen. No gallstones, gallbladder wall thickening, or biliary dilatation. Pancreas: Unremarkable. No pancreatic ductal dilatation or surrounding inflammatory changes. Spleen: Normal in size without significant abnormality. Adrenals/Urinary Tract: Adrenal glands are unremarkable. Simple, benign bilateral renal cortical cysts, including a large exophytic cyst arising from the anterior midportion of the right kidney. No obvious calculi. No hydronephrosis. Bladder is unremarkable. Stomach/Bowel: Stomach is within normal limits. Appendix appears normal. No evidence of bowel wall thickening, distention, or inflammatory changes. Descending and sigmoid diverticulosis Vascular/Lymphatic: Previously noted portal venous thrombus not clearly seen on today's examination (series 6, image 30). No enlarged abdominal or pelvic lymph nodes. Reproductive: Biopsy marking clips or fiducials in the expected vicinity of the prostate, assessment of the low pelvis limited by motion and right hip arthroplasty. Other: Midline ventral hernia containing nonobstructed mid small bowel (series 2, image 54). No ascites. Musculoskeletal: Status post right hip total arthroplasty. IMPRESSION: 1. Examination pulmonary embolism significantly limited by breath motion artifact. Within this limitation, no central or proximal segmental pulmonary embolus. 2. No acute CT findings of the chest, abdomen, or pelvis to explain sepsis within significant limitations of breath motion, patient motion, and streak artifact. 3. Descending and sigmoid diverticulosis without evidence of acute diverticulitis. 4. Midline ventral hernia containing nonobstructed mid small bowel. 5. Previously noted portal venous thrombus is not appreciated on today's  examination and presumably resolved. Electronically Signed   By: Jearld Lesch M.D.   On: 05/31/2023 19:16   DG Chest Port 1 View Result Date: 05/31/2023 CLINICAL DATA:  Possible sepsis. EXAM: PORTABLE CHEST 1 VIEW COMPARISON:  April 22, 2023. FINDINGS: Stable cardiomediastinal silhouette. Both lungs are clear. The visualized skeletal structures are unremarkable. IMPRESSION: No active disease. Electronically Signed   By: Lupita Raider M.D.   On: 05/31/2023 15:42     Assessment and Plan: No notes have been filed under this hospital service. Service: Hospitalist  Principal Problem:   Sepsis secondary to UTI [facility]) Active Problems:   COPD (chronic obstructive pulmonary disease) (HCC)   Essential hypertension   Acute anemia  Sepsis secondary to UTI with encephalopathy Blood cultures and urine cultures obtained Continue broad-spectrum antibiotics CBC in the morning Check flu, RSV, COVID Repeat CMP, phosphorus, calcium in the morning Hypertension Continue blood pressure control Acute anemia Transfuse 2 units Repeat CBC in the morning COPD   Advance Care Planning:   Code Status: Limited: Do not attempt resuscitation (DNR) -DNR-LIMITED -Do Not Intubate/DNI    Consults: none  Family Communication:   Severity of Illness: The appropriate patient status for this patient is INPATIENT. Inpatient  status is judged to be reasonable and necessary in order to provide the required intensity of service to ensure the patient's safety. The patient's presenting symptoms, physical exam findings, and initial radiographic and laboratory data in the context of their chronic comorbidities is felt to place them at high risk for further clinical deterioration. Furthermore, it is not anticipated that the patient will be medically stable for discharge from the hospital within 2 midnights of admission.   * I certify that at the point of admission it is my clinical judgment that the patient will require  inpatient hospital care spanning beyond 2 midnights from the point of admission due to high intensity of service, high risk for further deterioration and high frequency of surveillance required.*  Author: Levie Heritage, DO 05/31/2023 8:02 PM  For on call review [email or web address].

## 2023-05-31 NOTE — Consult Note (Signed)
 Pharmacy Antibiotic Note  Randy Russo is a 83 y.o. male admitted on 05/31/2023 with sepsis.  Pharmacy has been consulted for vancomycin dosing.  Plan: Start vancomycin 1750 mg IV every 24 hours Estimated AUC 491.3, Cmin 10.6 IBW, Scr rounded to 0.8, Vd 0.72 Vancomycin levels at steady state or as clinically indicated Flagyl 500 mg IV every 12 hours per provider  Height: 5\' 6"  (167.6 cm) Weight: 85.7 kg (188 lb 15 oz) IBW/kg (Calculated) : 63.8  Temp (24hrs), Avg:98.8 F (37.1 C), Min:97.8 F (36.6 C), Max:99.9 F (37.7 C)  Recent Labs  Lab 05/31/23 1458 05/31/23 1648  WBC 24.8*  --   CREATININE 0.78  --   LATICACIDVEN 1.5 1.5    Estimated Creatinine Clearance: 73.1 mL/min (by C-G formula based on SCr of 0.78 mg/dL).    No Known Allergies  Antimicrobials this admission: vancomycin 3/8 >>  Flagyl 3/8 >> Ceftriaxone x 1 3/8  Dose adjustments this admission: N/A  Microbiology results: 3/8 BCx: pending 3/8 UCx: pending    Thank you for allowing pharmacy to be a part of this patient's care.  Barrie Folk, PharmD 05/31/2023 6:35 PM

## 2023-05-31 NOTE — Progress Notes (Signed)
 RSV positive. Due to tachypnea, will start on solumedrol

## 2023-05-31 NOTE — Progress Notes (Signed)
 1829 Dr. Hyacinth Meeker at bedside. Obtained ABG & 40mg  IV ketamine given for agitation.  1843 Patient taken to CT. Dr. Hyacinth Meeker at bedside. 40mg  IV ketamine given.  1858 Patient back to room. Fluid bolus restarted. Blood transfusion started. Duoneb administered. On 15L NRB. Tolerating blood.  Night shift RN now at bedside.

## 2023-06-01 DIAGNOSIS — B338 Other specified viral diseases: Secondary | ICD-10-CM | POA: Diagnosis present

## 2023-06-01 DIAGNOSIS — N390 Urinary tract infection, site not specified: Secondary | ICD-10-CM

## 2023-06-01 DIAGNOSIS — A419 Sepsis, unspecified organism: Secondary | ICD-10-CM | POA: Diagnosis not present

## 2023-06-01 LAB — TYPE AND SCREEN
ABO/RH(D): B POS
Antibody Screen: NEGATIVE
Unit division: 0
Unit division: 0

## 2023-06-01 LAB — CBC
HCT: 24.4 % — ABNORMAL LOW (ref 39.0–52.0)
Hemoglobin: 7.5 g/dL — ABNORMAL LOW (ref 13.0–17.0)
MCH: 28.4 pg (ref 26.0–34.0)
MCHC: 30.7 g/dL (ref 30.0–36.0)
MCV: 92.4 fL (ref 80.0–100.0)
Platelets: 416 10*3/uL — ABNORMAL HIGH (ref 150–400)
RBC: 2.64 MIL/uL — ABNORMAL LOW (ref 4.22–5.81)
RDW: 23.8 % — ABNORMAL HIGH (ref 11.5–15.5)
WBC: 28.1 10*3/uL — ABNORMAL HIGH (ref 4.0–10.5)
nRBC: 0.3 % — ABNORMAL HIGH (ref 0.0–0.2)

## 2023-06-01 LAB — COMPREHENSIVE METABOLIC PANEL
ALT: 32 U/L (ref 0–44)
AST: 22 U/L (ref 15–41)
Albumin: 1.6 g/dL — ABNORMAL LOW (ref 3.5–5.0)
Alkaline Phosphatase: 105 U/L (ref 38–126)
Anion gap: 4 — ABNORMAL LOW (ref 5–15)
BUN: 15 mg/dL (ref 8–23)
CO2: 20 mmol/L — ABNORMAL LOW (ref 22–32)
Calcium: 7.2 mg/dL — ABNORMAL LOW (ref 8.9–10.3)
Chloride: 114 mmol/L — ABNORMAL HIGH (ref 98–111)
Creatinine, Ser: 0.57 mg/dL — ABNORMAL LOW (ref 0.61–1.24)
GFR, Estimated: >60 mL/min (ref >60)
Glucose, Bld: 141 mg/dL — ABNORMAL HIGH (ref 70–99)
Potassium: 3.4 mmol/L — ABNORMAL LOW (ref 3.5–5.1)
Sodium: 138 mmol/L (ref 135–145)
Total Bilirubin: 2.7 mg/dL — ABNORMAL HIGH (ref 0.0–1.2)
Total Protein: 5.9 g/dL — ABNORMAL LOW (ref 6.5–8.1)

## 2023-06-01 LAB — BPAM RBC
Blood Product Expiration Date: 202504012359
Blood Product Expiration Date: 202504032359
ISSUE DATE / TIME: 202503081828
ISSUE DATE / TIME: 202503082133
Unit Type and Rh: 1700
Unit Type and Rh: 1700

## 2023-06-01 LAB — IRON AND TIBC
Iron: 24 ug/dL — ABNORMAL LOW (ref 45–182)
Saturation Ratios: 21 % (ref 17.9–39.5)
TIBC: 115 ug/dL — ABNORMAL LOW (ref 250–450)
UIBC: 91 ug/dL

## 2023-06-01 LAB — VITAMIN B12: Vitamin B-12: 1055 pg/mL — ABNORMAL HIGH (ref 180–914)

## 2023-06-01 LAB — BRAIN NATRIURETIC PEPTIDE: B Natriuretic Peptide: 118 pg/mL — ABNORMAL HIGH (ref 0.0–100.0)

## 2023-06-01 LAB — PHOSPHORUS: Phosphorus: 3.6 mg/dL (ref 2.5–4.6)

## 2023-06-01 LAB — PROCALCITONIN: Procalcitonin: 3.17 ng/mL

## 2023-06-01 LAB — FOLATE: Folate: 10.4 ng/mL (ref >5.9)

## 2023-06-01 LAB — MAGNESIUM: Magnesium: 1.9 mg/dL (ref 1.7–2.4)

## 2023-06-01 MED ORDER — LACTATED RINGERS IV SOLN: INTRAVENOUS | Status: AC

## 2023-06-01 MED ORDER — VITAMIN C 500 MG PO TABS
500.0000 mg | ORAL_TABLET | Freq: Every day | ORAL | Status: DC
  Administered 2023-06-01 – 2023-06-06 (×6): 500 mg via ORAL
  Filled 2023-06-01 (×6): qty 1

## 2023-06-01 MED ORDER — METHYLPREDNISOLONE SODIUM SUCC 40 MG IJ SOLR
40.0000 mg | Freq: Two times a day (BID) | INTRAMUSCULAR | Status: DC
  Administered 2023-06-01: 40 mg via INTRAVENOUS
  Filled 2023-06-01: qty 1

## 2023-06-01 MED ORDER — ACETAMINOPHEN 325 MG PO TABS
650.0000 mg | ORAL_TABLET | Freq: Four times a day (QID) | ORAL | Status: DC | PRN
  Administered 2023-06-06: 650 mg via ORAL
  Filled 2023-06-01: qty 2

## 2023-06-01 MED ORDER — PIPERACILLIN-TAZOBACTAM 3.375 G IVPB
3.3750 g | Freq: Three times a day (TID) | INTRAVENOUS | Status: DC
  Administered 2023-06-01 – 2023-06-04 (×9): 3.375 g via INTRAVENOUS
  Filled 2023-06-01 (×9): qty 50

## 2023-06-01 MED ORDER — GUAIFENESIN-DM 100-10 MG/5ML PO SYRP
10.0000 mL | ORAL_SOLUTION | Freq: Three times a day (TID) | ORAL | Status: DC
  Administered 2023-06-01 – 2023-06-06 (×13): 10 mL via ORAL
  Filled 2023-06-01 (×14): qty 10

## 2023-06-01 MED ORDER — PIPERACILLIN-TAZOBACTAM 3.375 G IVPB 30 MIN
3.3750 g | Freq: Once | INTRAVENOUS | Status: AC
  Administered 2023-06-01: 3.375 g via INTRAVENOUS
  Filled 2023-06-01: qty 50

## 2023-06-01 NOTE — Progress Notes (Signed)
 SLP Cancellation Note  Patient Details Name: Randy Russo MRN: [id number] DOB: March 21, 1941   Cancelled treatment:       Reason Eval/Treat Not Completed: SLP screened, no needs identified, will sign off. Pt's speech, language and cognition are functioning at baseline, our service will sign off. Thank you,  Randy Russo, Randy Russo Speech Language Pathologist    Georgetta Haber 06/01/2023, 11:56 AM

## 2023-06-01 NOTE — Assessment & Plan Note (Signed)
 On admission. - Continue Eliquis. -COVID-negative  06-02-2023 to 06-03-2023 see Dr. Windell Norfolk documentation   06-04-2023 off Eliquis for possible EGD/colonoscopy.  On admission, prior portal vein thrombosis has resolved. Can stop Eliquis at discharge.  06-06-2023 resolved. Will stop Eliquis at discharge.

## 2023-06-01 NOTE — Assessment & Plan Note (Signed)
 On admission. Upper respiratory symptoms likely due to RSV. -Wheezing, with rhonchi, -Currently on room air, satting 91%. -Continue O2 supplement via nasal cannula, maintaining O2 sat greater than 92%. -As needed DuoNeb bronchodilator treatment, as needed mucolytics  06-02-2023 to 06-03-2023 see Dr. Windell Norfolk documentation   06-04-2023 on RA. No wheezing. Continue inhalers and prn nebs.  06-05-2023 chronic. Stable.

## 2023-06-01 NOTE — Plan of Care (Signed)
  Problem: Acute Rehab PT Goals(only PT should resolve) Goal: Pt Will Go Supine/Side To Sit Outcome: Progressing Flowsheets (Taken 06/01/2023 1259) Pt will go Supine/Side to Sit:  with minimal assist  with moderate assist Goal: Patient Will Transfer Sit To/From Stand Outcome: Progressing Flowsheets (Taken 06/01/2023 1259) Patient will transfer sit to/from stand:  with minimal assist  with moderate assist Goal: Pt Will Transfer Bed To Chair/Chair To Bed Outcome: Progressing Flowsheets (Taken 06/01/2023 1259) Pt will Transfer Bed to Chair/Chair to Bed: with mod assist Goal: Pt Will Ambulate Outcome: Progressing Flowsheets (Taken 06/01/2023 1259) Pt will Ambulate:  10 feet  with moderate assist  with rolling walker   12:59 PM, 06/01/23 Ocie Bob, MPT Physical Therapist with [facility] [phone] office [phone] mobile phone

## 2023-06-01 NOTE — Assessment & Plan Note (Signed)
 On admission. - Hypertensive, with holding home BP meds. Ending IV fluid hydration  06-02-2023 to 06-03-2023 see Dr. Windell Norfolk documentation   06-04-2023 stable. BP meds on hold due to borderline low BP.  06-05-2023 Stable. Not on any HTN meds for now.

## 2023-06-01 NOTE — Assessment & Plan Note (Addendum)
 On admission. Patient met sepsis criteria on admission-cephalopathic, tachypneic, tachycardic, WBC 24.8  >> 28.1, lactic acid 1.5, -Still meeting sepsis criteria, tachypneic, tachycardic, blood pressure soft. -Likely source urinary-UTI and RSV infection. -Will follow-up with blood and urine cultures. -Continue on broad-spectrum antibiotics: Zosyn, Flagyl, vancomycin for now with a quick de-escalation 24 hours, based on cultures. -Sepsis protocol, s/p IV hydration -continue maintenance IV fluid with LR.  06-02-2023 to 06-03-2023 see Dr. Windell Norfolk documentation  06-04-2023  urine cx negative. Has had 72 hours of IV zosyn. Should be sufficient for culture negative UTI.  06-05-2023 resolved.

## 2023-06-01 NOTE — Assessment & Plan Note (Signed)
-   Continue wound care per RN-consult placed -Rotating patient every 2 hours,

## 2023-06-01 NOTE — Plan of Care (Signed)
  Problem: Nutrition: Goal: Adequate nutrition will be maintained Outcome: Progressing   Problem: Elimination: Goal: Will not experience complications related to urinary retention Outcome: Progressing   Problem: Pain Managment: Goal: General experience of comfort will improve and/or be controlled Outcome: Progressing   Problem: Skin Integrity: Goal: Risk for impaired skin integrity will decrease Outcome: Progressing

## 2023-06-01 NOTE — Assessment & Plan Note (Signed)
 On admission. - Continue supportive care. -Supplemental oxygen as needed. -Continue as needed Tylenol, mucolytics. -IV steroids with quick taper  06-02-2023 to 06-03-2023 see Dr. Windell Norfolk documentation   06-04-2023 on RA. Off steroids.  06-05-2023 resolved. On RA and off steroids.

## 2023-06-01 NOTE — Hospital Course (Addendum)
 Randy Russo is a 83 y.o. 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   Patient unable to give history due to encephalopathy.  Patient is a resident of a skilled nursing facility he was brought here due to "his sepsis labs being abnormal".  Here, the patient was initially fairly lucid.  UA showed a bladder infection, he was started on Rocephin.  CBC showed anemia but his rectal exam was negative.  Shortly after his labs returned, he had an abrupt decline and became encephalopathic.  He also became tachycardic and tachypneic and quite agitated.  He was given ketamine with good effect.   CT of chest and CT of abdomen pelvis were done and were negative for acute infection.   Due to his anemia, a blood transfusion was started. Respiratory panel: SARS-CoV-2, influenza A/B-negative, RSV positive UA: Hazy, leukocyte Estrace, WBC 21-50 Fecal Hemoccult negative

## 2023-06-01 NOTE — Assessment & Plan Note (Signed)
 On admission. - Anemia of chronic disease, on Eliquis. - POA: Globin down from 10.8  >> 6.1. -Hemoccult negative. -Overnight 06/01/2023 was transfused with 2U PRBCs. -Trend H&H closely, transfuse as needed     Latest Ref Rng & Units 06/01/2023    4:03 AM 05/31/2023    2:58 PM 05/08/2023   10:12 AM  CBC  WBC 4.0 - 10.5 K/uL 28.1  24.8  5.8   Hemoglobin 13.0 - 17.0 g/dL 7.5  6.1  46.9   Hematocrit 39.0 - 52.0 % 24.4  20.2  35.5   Platelets 150 - 400 K/uL 416  478  365    06-02-2023 to 06-03-2023 see Dr. Windell Norfolk documentation   06-04-2023 GI consulted on 06-02-2023. GI trying to decide if they are going to proceed with EGD/colonoscopy. Eliquis has been held. Last dose 06-03-2023 @ 0916  06-05-2023 for EGD/colonoscopy today. Hopefully DC to SNF tomorrow.  06-06-2023 EGD/Colonoscopy canceled yesterday by GI and anesthesia. They did not feel the benefits of procedure outweighed risks. They felt that risks were too great given stable HgB and no evidence of bleeding.   DC to home. He can f/u with outpatient GI if family want to pursue outpatient endoscopy. DC hgB of 8.8.  will need repeat HgB in 1 week at SNF. Forward results to [facility].

## 2023-06-01 NOTE — TOC Initial Note (Signed)
 Transition of Care [facility]) - Initial/Assessment Note    Patient Details  Name: Randy Russo MRN: [id number] Date of Birth: [birth year]  [facility]) CM/SW Contact:    Leitha Bleak, RN Phone Number: 06/01/2023, 4:33 PM  Clinical Narrative:          Patient is from [facility], multiple admissions. PT is saying SNF. TOC updated Debbie, Patient is not medically ready, he will need INS AUTH. TOC following.          Expected Discharge Plan: Skilled Nursing Facility Barriers to Discharge: Continued Medical Work up   Patient Goals and CMS Choice Patient states their goals for this hospitalization and ongoing recovery are:: return to [facility].gov Compare Post Acute Care list provided to:: Patient        Expected Discharge Plan and Services      Living arrangements for the past 2 months: Skilled Nursing Facility                   Prior Living Arrangements/Services Living arrangements for the past 2 months: Skilled Nursing Facility     Activities of Daily Living   ADL Screening (condition at time of admission) Independently performs ADLs?: No Does the patient have a NEW difficulty with bathing/dressing/toileting/self-feeding that is expected to last >3 days?: Yes (Initiates electronic notice to provider for possible OT consult) Does the patient have a NEW difficulty with getting in/out of bed, walking, or climbing stairs that is expected to last >3 days?: Yes (Initiates electronic notice to provider for possible PT consult) Does the patient have a NEW difficulty with communication that is expected to last >3 days?: Yes (Initiates electronic notice to provider for possible SLP consult) Is the patient deaf or have difficulty hearing?: No Does the patient have difficulty seeing, even when wearing glasses/contacts?: No Does the patient have difficulty concentrating, remembering, or making decisions?: Yes  Permission Sought/Granted      Admission  diagnosis:  Hypokalemia [E87.6] Sepsis (HCC) [A41.9] Symptomatic anemia [D64.9] Sepsis without acute organ dysfunction, due to unspecified organism [facility]) [A41.9] Patient Active Problem List   Diagnosis Date Noted   RSV infection 06/01/2023   Sepsis secondary to UTI (HCC) 05/31/2023   Acute anemia 04/24/2023   Acute febrile illness 04/23/2023   Portal vein thrombosis 04/22/2023   Ambulatory dysfunction 04/12/2023   Acute lower GI bleeding 11/01/2021   Pressure injury of skin 11/01/2021   Right inguinal hernia    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   Rectal bleeding 11/08/2018   Colitis 11/08/2018   Odynophagia    Gastroesophageal reflux disease without esophagitis    Acute blood loss anemia 01/02/2018   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    Syncope and collapse    COPD (chronic obstructive pulmonary disease) (HCC) 12/27/2017   Essential hypertension 12/27/2017   PCP:  Assunta Found, MD Pharmacy:   [facility] [address] [place] - [address] [place] [address] Phone: [phone] Fax: [phone]  [facility] - [place], [place] - [address] [place] [address] Phone: [phone] Fax: [phone]     Social Drivers of Health (SDOH) Social History: SDOH Screenings   Food Insecurity: No Food Insecurity (05/31/2023)  Recent Concern: Food Insecurity - Food Insecurity Present (04/22/2023)  Housing: Low Risk  (05/31/2023)  Recent Concern: Housing - High Risk (04/13/2023)  Transportation Needs: No Transportation Needs (05/31/2023)  Utilities: Not  At Risk (05/31/2023)  Financial Resource Strain: Low Risk  (11/30/2022)   [facility]  Physical Activity: Unknown (12/27/2017)  Social Connections: Patient Unable To Answer (05/31/2023)  Tobacco Use: Medium Risk (05/31/2023)  Health Literacy: Medium Risk (11/30/2022)   Received from [facility]   SDOH  Interventions:     Readmission Risk Interventions    06/01/2023    4:32 PM  Readmission Risk Prevention Plan  Transportation Screening Complete  PCP or Specialist Appt within 3-5 Days Complete  HRI or Home Care Consult Complete  Social Work Consult for Recovery Care Planning/Counseling Complete  Palliative Care Screening Not Applicable  Medication Review [employer]) Complete

## 2023-06-01 NOTE — Evaluation (Signed)
 Physical Therapy Evaluation Patient Details Name: Randy Russo MRN: [id number] DOB: 01-Mar-1941 Today's Date: 06/01/2023  History of Present Illness  Randy Russo is a 83 y.o. 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  Patient unable to give history due to encephalopathy.  Patient is a resident of a skilled nursing facility he was brought here due to "his sepsis labs being abnormal".  Here, the patient was initially fairly lucid.  UA showed a bladder infection, he was started on Rocephin.  CBC showed anemia but his rectal exam was negative.  Shortly after his labs returned, he had an abrupt decline and became encephalopathic.  He also became tachycardic and tachypneic and quite agitated.  He was given ketamine with good effect.  CT of chest and CT of abdomen pelvis were done and were negative for acute infection.  Due to his anemia, a blood transfusion was started.   Clinical Impression  Patient very unsteady on feet with right foot frequently sliding forward when standing, unable to transfer using to RW due to buckling of knees/BLE weakness, required Max assist stand pivot with knees blocked for completing chair/bed transfers. Patient tolerated sitting up in chair after therapy. Patient will benefit from continued skilled physical therapy in hospital and recommended venue below to increase strength, balance, endurance for safe ADLs and gait.          If plan is discharge home, recommend the following: A lot of help with bathing/dressing/bathroom;A lot of help with walking and/or transfers;Help with stairs or ramp for entrance;Assistance with cooking/housework   Can travel by private vehicle   No    Equipment Recommendations None recommended by PT  Recommendations for Other Services       Functional Status Assessment  Patient has had a recent decline in their functional status and/or demonstrates limited ability to make significant improvements in function in a reasonable and predictable amount of time     Precautions / Restrictions Precautions Precautions: Fall Restrictions Weight Bearing Restrictions Per Provider Order: No      Mobility  Bed Mobility Overal bed mobility: Needs Assistance Bed Mobility: Supine to Sit, Sit to Supine     Supine to sit: Mod assist Sit to supine: Mod assist   General bed mobility comments: slow labored movement    Transfers Overall transfer level: Needs assistance Equipment used: Rolling walker (2 wheels), 1 person hand held assist Transfers: Sit to/from Stand, Bed to chair/wheelchair/BSC Sit to Stand: Mod assist, Max assist Stand pivot transfers: Max assist         General transfer comment: unable to maintain standing balance using RW, required stand pviot with knees blocked to transfer to chair    Ambulation/Gait Ambulation/Gait assistance: Max assist Gait Distance (Feet): 2 Feet Assistive device: Rolling walker (2 wheels) Gait Pattern/deviations: Decreased step length - right, Decreased step length - left, Decreased stride length, Knees buckling, Shuffle Gait velocity: slow     General Gait Details: limited to a couple of shuffling side steps before having to sit due to knees buckling  Stairs            Wheelchair Mobility     Tilt Bed    Modified Rankin (Stroke Patients Only)       Balance Overall balance assessment: Needs assistance Sitting-balance support: Feet supported Sitting balance-Leahy Scale: Fair Sitting balance - Comments: fair/good seated at EOB  Standing balance support: Reliant on assistive device for balance, During functional activity, Bilateral upper extremity supported Standing balance-Leahy Scale: Poor Standing balance comment: using RW                             Pertinent Vitals/Pain Pain  Assessment Pain Assessment: No/denies pain    Home Living Family/patient expects to be discharged to:: Skilled nursing facility                        Prior Function Prior Level of Function : Needs assist       Physical Assist : Mobility (physical);ADLs (physical)     Mobility Comments: very short household distances using RW, uses wheelchair mostly, 2 person assist for transfers, "per patient" ADLs Comments: Assisted by SNF staff     Extremity/Trunk Assessment   Upper Extremity Assessment Upper Extremity Assessment: Generalized weakness    Lower Extremity Assessment Lower Extremity Assessment: Generalized weakness    Cervical / Trunk Assessment Cervical / Trunk Assessment: Normal  Communication   Communication Communication: No apparent difficulties    Cognition Arousal: Alert Behavior During Therapy: WFL for tasks assessed/performed   PT - Cognitive impairments: No apparent impairments                         Following commands: Intact       Cueing       General Comments      Exercises     Assessment/Plan    PT Assessment Patient needs continued PT services  PT Problem List Decreased strength;Decreased activity tolerance;Decreased balance;Decreased mobility       PT Treatment Interventions DME instruction;Gait training;Functional mobility training;Therapeutic activities;Therapeutic exercise;Balance training;Patient/family education    PT Goals (Current goals can be found in the Care Plan section)  Acute Rehab PT Goals Patient Stated Goal: return home with SNF staff to assist PT Goal Formulation: With patient Time For Goal Achievement: 06/15/23 Potential to Achieve Goals: Good    Frequency Min 2X/week     Co-evaluation               AM-PAC PT "6 Clicks" Mobility  Outcome Measure Help needed turning from your back to your side while in a flat bed without using bedrails?: A Lot Help needed moving from lying on your  back to sitting on the side of a flat bed without using bedrails?: A Lot Help needed moving to and from a bed to a chair (including a wheelchair)?: A Lot Help needed standing up from a chair using your arms (e.g., wheelchair or bedside chair)?: A Lot Help needed to walk in hospital room?: A Lot Help needed climbing 3-5 steps with a railing? : Total 6 Click Score: 11    End of Session   Activity Tolerance: Patient tolerated treatment well;Patient limited by fatigue Patient left: in chair;with call bell/phone within reach Nurse Communication: Mobility status PT Visit Diagnosis: Unsteadiness on feet (R26.81);Other abnormalities of gait and mobility (R26.89);Muscle weakness (generalized) (M62.81)    Time: 6578-4696 PT Time Calculation (min) (ACUTE ONLY): 30 min   Charges:   PT Evaluation $PT Eval Moderate Complexity: 1 Mod PT Treatments $Therapeutic Activity: 23-37 mins PT General Charges $$ ACUTE PT VISIT: 1 Visit         12:58 PM, 06/01/23 Ocie Bob, MPT Physical Therapist with [facility] [phone] office [phone] mobile phone

## 2023-06-01 NOTE — Progress Notes (Signed)
 PROGRESS NOTE    Patient: Randy Russo                            PCP: Assunta Found, MD                    DOB: 09/26/1940            DOA: 05/31/2023 UEA:[id number]             DOS: 06/01/2023, 7:41 AM   LOS: 1 day   Date of Service: The patient was seen and examined on 06/01/2023  Subjective:   The patient was seen and examined this morning. Afebrile 97.9 Tmax 100.6, tachypneic RR 27, tachycardic heart rate 104, BP 99/74 Satting 91% on room air Still meeting sepsis criteria  Brief Narrative:    Randy Russo is a 83 y.o. 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   Patient unable to give history due to encephalopathy.  Patient is a resident of a skilled nursing facility he was brought here due to "his sepsis labs being abnormal".  Here, the patient was initially fairly lucid.  UA showed a bladder infection, he was started on Rocephin.  CBC showed anemia but his rectal exam was negative.  Shortly after his labs returned, he had an abrupt decline and became encephalopathic.  He also became tachycardic and tachypneic and quite agitated.  He was given ketamine with good effect.   CT of chest and CT of abdomen pelvis were done and were negative for acute infection.   Due to his anemia, a blood transfusion was started. Respiratory panel: SARS-CoV-2, influenza A/B-negative, RSV positive UA: Hazy, leukocyte Estrace, WBC 21-50 Fecal Hemoccult negative     Assessment & Plan:   Principal Problem:   Sepsis secondary to UTI [facility]) Active Problems:   COPD (chronic obstructive pulmonary disease) (HCC)   Essential hypertension   Gastroesophageal reflux disease without esophagitis   Pressure injury of skin   Ambulatory dysfunction   Portal vein thrombosis   Acute anemia     Assessment and Plan: * Sepsis secondary to  UTI [facility]) -Patient met sepsis criteria on admission-cephalopathic, tachypneic, tachycardic, WBC 24.8  >> 28.1, lactic acid 1.5, -Still meeting sepsis criteria, tachypneic, tachycardic, blood pressure soft -Likely source urinary-UTI and RSV infection -Will follow-up with blood and urine cultures -Continue on broad-spectrum antibiotics: Zosyn, Flagyl, vancomycin for now with a quick de-escalation 24 hours, based on cultures -Sepsis protocol, s/p IV hydration -continue maintenance IV fluid with LR   Acute anemia - Anemia of chronic disease, on Eliquis - POA: Globin down from 10.8  >> 6.1 -Hemoccult negative -Overnight 06/01/2023 was transfused with 2U PRBCs -Trend H&H closely, transfuse as needed     Latest Ref Rng & Units 06/01/2023    4:03 AM 05/31/2023    2:58 PM 05/08/2023   10:12 AM  CBC  WBC 4.0 - 10.5 K/uL 28.1  24.8  5.8   Hemoglobin 13.0 - 17.0 g/dL 7.5  6.1  47.8   Hematocrit 39.0 - 52.0 % 24.4  20.2  35.5   Platelets 150 - 400 K/uL 416  478  365      Portal vein thrombosis - Continue Eliquis -COVID-negative  Pressure injury of skin - Continue wound care per RN-consult placed -Rotating patient every 2 hours,  Essential hypertension - Hypertensive, with holding home BP meds Ending IV fluid hydration  COPD (chronic obstructive pulmonary disease) (HCC) - Upper respiratory symptoms likely due to RSV -Wheezing, with rhonchi, -Currently on room air, satting 91% -Continue O2 supplement via nasal cannula, maintaining O2 sat greater than 92% -As needed DuoNeb bronchodilator treatment, as needed mucolytics      ----------------------------------------------------------------------------------------------------------------------------------------------- Nutritional status:  The patient's BMI is: Body mass index is 30.67 kg/m. I agree with the assessment and plan as outlined  Skin Assessment: I have examined the patient's skin and I agree with the wound assessment as  performed by wound care team As outlined belowe: Pressure Injury 11/01/21 Rectum Right;Lower Stage 2 -  Partial thickness loss of dermis presenting as a shallow open injury with a red, pink wound bed without slough. (Active)  11/01/21 0538  Location: Rectum  Location Orientation: Right;Lower  Staging: Stage 2 -  Partial thickness loss of dermis presenting as a shallow open injury with a red, pink wound bed without slough.  Wound Description (Comments):   Present on Admission: Yes     ---------------------------------------------------------------------------------------------------------------------------------------------------- Cultures; Blood Cultures x 2 >> NGT Sputum Culture >> NGT    ------------------------------------------------------------------------------------------------------------------------------------------------  DVT prophylaxis:   apixaban (ELIQUIS) tablet 5 mg   Code Status:   Code Status: Limited: Do not attempt resuscitation (DNR) -DNR-LIMITED -Do Not Intubate/DNI   Family Communication: No family member present at bedside- attempt will be made to update daily  -Advance care planning has been discussed.   Admission status:   Status is: Inpatient Remains inpatient appropriate because: Treatment for sepsis, with IV fluids, IV antibiotics   Disposition: From  - home             Planning for discharge in 1-2 days: to   Procedures:   No admission procedures for hospital encounter.   Antimicrobials:  Anti-infectives (From admission, onward)    Start     Dose/Rate Route Frequency Ordered Stop   06/01/23 1830  vancomycin (VANCOREADY) IVPB 1750 mg/350 mL        1,750 mg 175 mL/hr over 120 Minutes Intravenous Every 24 hours 05/31/23 1839     06/01/23 1800  cefTRIAXone (ROCEPHIN) 2 g in sodium chloride 0.9 % 100 mL IVPB  Status:  Discontinued        2 g 200 mL/hr over 30 Minutes Intravenous Every 24 hours 05/31/23 2226 06/01/23 0719   05/31/23 1845   metroNIDAZOLE (FLAGYL) IVPB 500 mg        500 mg 100 mL/hr over 60 Minutes Intravenous 2 times daily 05/31/23 1830     05/31/23 1845  vancomycin (VANCOREADY) IVPB 1750 mg/350 mL        1,750 mg 175 mL/hr over 120 Minutes Intravenous  Once 05/31/23 1834 05/31/23 2151   05/31/23 1600  cefTRIAXone (ROCEPHIN) 2 g in sodium chloride 0.9 % 100 mL IVPB        2 g 200 mL/hr over 30 Minutes Intravenous Once 05/31/23 1549 05/31/23 1647        Medication:   sodium chloride   Intravenous Once   sodium chloride   Intravenous Once   apixaban  5 mg Oral BID   Chlorhexidine Gluconate Cloth  6 each Topical Daily   docusate sodium  200 mg Oral Daily   feeding supplement  237 mL Oral BID BM   latanoprost  1 drop Both Eyes QHS   methylPREDNISolone (SOLU-MEDROL) injection  120 mg Intravenous Daily   umeclidinium bromide  1  puff Inhalation Daily    albuterol, ondansetron **OR** ondansetron (ZOFRAN) IV, mouth rinse   Objective:   Vitals:   06/01/23 0300 06/01/23 0400 06/01/23 0500 06/01/23 0600  BP:  (!) 106/57 (!) 103/46 99/74  Pulse:      Resp:  (!) 29 (!) 23 (!) 27  Temp: 97.9 F (36.6 C)     TempSrc: Oral     SpO2:      Weight:      Height:        Intake/Output Summary (Last 24 hours) at 06/01/2023 0741 Last data filed at 06/01/2023 1610 Gross per 24 hour  Intake 1666.21 ml  Output 600 ml  Net 1066.21 ml   Filed Weights   05/31/23 1356 05/31/23 2226  Weight: 85.7 kg 86.2 kg     Physical examination:   Constitution: Somnolent, confused, but awake,   HEENT:        Normocephalic, PERRL, otherwise with in Normal limits  Chest:         Chest symmetric Cardio vascular:  S1/S2, RRR, No murmure, No Rubs or Gallops  pulmonary: Clear to auscultation bilaterally, respirations unlabored, negative wheezes / crackles Abdomen: Soft, non-tender, non-distended, bowel sounds,no masses, no organomegaly Muscular skeletal: Severe-generalized global weakness, Unable to ambulate at this point ?   Bedbound Limited exam - in bed, able to move all 4 extremities,   Neuro: CNII-XII intact. , normal motor and sensation, reflexes intact  Extremities: No pitting edema lower extremities, +2 pulses  Skin: Dry, warm to touch, negative for any Rashes, No open wounds Wounds: per nursing documentation   ------------------------------------------------------------------------------------------------------------------------------------------    LABs:     Latest Ref Rng & Units 06/01/2023    4:03 AM 05/31/2023    2:58 PM 05/08/2023   10:12 AM  CBC  WBC 4.0 - 10.5 K/uL 28.1  24.8  5.8   Hemoglobin 13.0 - 17.0 g/dL 7.5  6.1  96.0   Hematocrit 39.0 - 52.0 % 24.4  20.2  35.5   Platelets 150 - 400 K/uL 416  478  365       Latest Ref Rng & Units 06/01/2023    4:03 AM 05/31/2023    2:58 PM 05/08/2023   10:12 AM  CMP  Glucose 70 - 99 mg/dL 454  098  93   BUN 8 - 23 mg/dL 15  21  15    Creatinine 0.61 - 1.24 mg/dL 1.19  1.47  8.29   Sodium 135 - 145 mmol/L 138  136  140   Potassium 3.5 - 5.1 mmol/L 3.4  3.2  3.8   Chloride 98 - 111 mmol/L 114  103  108   CO2 22 - 32 mmol/L 20  23  25    Calcium 8.9 - 10.3 mg/dL 7.2  8.1  9.2   Total Protein 6.5 - 8.1 g/dL 5.9  6.4  7.4   Total Bilirubin 0.0 - 1.2 mg/dL 2.7  3.4  0.8   Alkaline Phos 38 - 126 U/L 105  120  82   AST 15 - 41 U/L 22  37  24   ALT 0 - 44 U/L 32  36  38        Micro Results Recent Results (from the past 240 hours)  Blood Culture (routine x 2)     Status: None (Preliminary result)   Collection Time: 05/31/23  2:58 PM   Specimen: Right Antecubital; Blood  Result Value Ref Range Status   Specimen Description   Final  RIGHT ANTECUBITAL BOTTLES DRAWN AEROBIC AND ANAEROBIC   Special Requests Blood Culture adequate volume  Final   Culture   Final    NO GROWTH < 24 HOURS Performed at [facility], [address], [place], [place] [address]    Report Status PENDING  Incomplete  Blood Culture (routine x 2)     Status: None  (Preliminary result)   Collection Time: 05/31/23  2:58 PM   Specimen: BLOOD RIGHT HAND  Result Value Ref Range Status   Specimen Description   Final    BLOOD RIGHT HAND BOTTLES DRAWN AEROBIC AND ANAEROBIC   Special Requests Blood Culture adequate volume  Final   Culture   Final    NO GROWTH < 24 HOURS Performed at [facility], [address] [place], [place] [address]    Report Status PENDING  Incomplete  Resp panel by RT-PCR (RSV, Flu A&B, Covid) Anterior Nasal Swab     Status: Abnormal   Collection Time: 05/31/23  8:07 PM   Specimen: Anterior Nasal Swab  Result Value Ref Range Status   SARS Coronavirus 2 by RT PCR NEGATIVE NEGATIVE Final    Comment: (NOTE) SARS-CoV-2 target nucleic acids are NOT DETECTED.  The SARS-CoV-2 RNA is generally detectable in upper respiratory specimens during the acute phase of infection. The lowest concentration of SARS-CoV-2 viral copies this assay can detect is 138 copies/mL. A negative result does not preclude SARS-Cov-2 infection and should not be used as the sole basis for treatment or other patient management decisions. A negative result may occur with  improper specimen collection/handling, submission of specimen other than nasopharyngeal swab, presence of viral mutation(s) within the areas targeted by this assay, and inadequate number of viral copies(<138 copies/mL). A negative result must be combined with clinical observations, patient history, and epidemiological information. The expected result is Negative.  Fact Sheet for Patients:  BloggerCourse.com  Fact Sheet for Healthcare Providers:  SeriousBroker.it  This test is no t yet approved or cleared by the [place] FDA and  has been authorized for detection and/or diagnosis of SARS-CoV-2 by FDA under an Emergency Use Authorization (EUA). This EUA will remain  in effect (meaning this test can be used) for the duration of  the COVID-19 declaration under Section 564(b)(1) of the Act, 21 U.S.C.section 360bbb-3(b)(1), unless the authorization is terminated  or revoked sooner.       Influenza A by PCR NEGATIVE NEGATIVE Final   Influenza B by PCR NEGATIVE NEGATIVE Final    Comment: (NOTE) The Xpert Xpress SARS-CoV-2/FLU/RSV plus assay is intended as an aid in the diagnosis of influenza from Nasopharyngeal swab specimens and should not be used as a sole basis for treatment. Nasal washings and aspirates are unacceptable for Xpert Xpress SARS-CoV-2/FLU/RSV testing.  Fact Sheet for Patients: BloggerCourse.com  Fact Sheet for Healthcare Providers: SeriousBroker.it  This test is not yet approved or cleared by the [place] FDA and has been authorized for detection and/or diagnosis of SARS-CoV-2 by FDA under an Emergency Use Authorization (EUA). This EUA will remain in effect (meaning this test can be used) for the duration of the COVID-19 declaration under Section 564(b)(1) of the Act, 21 U.S.C. section 360bbb-3(b)(1), unless the authorization is terminated or revoked.     Resp Syncytial Virus by PCR POSITIVE (A) NEGATIVE Final    Comment: (NOTE) Fact Sheet for Patients: BloggerCourse.com  Fact Sheet for Healthcare Providers: SeriousBroker.it  This test is not yet approved or cleared by the [place] FDA and has  been authorized for detection and/or diagnosis of SARS-CoV-2 by FDA under an Emergency Use Authorization (EUA). This EUA will remain in effect (meaning this test can be used) for the duration of the COVID-19 declaration under Section 564(b)(1) of the Act, 21 U.S.C. section 360bbb-3(b)(1), unless the authorization is terminated or revoked.  Performed at [facility], [address], [place], [place] [address]     Radiology Reports CT Angio Chest PE W and/or Wo Contrast Result Date:  05/31/2023 CLINICAL DATA:  Sepsis, altered mental status history of prostate cancer * Tracking Code: BO * EXAM: CT ANGIOGRAPHY CHEST CT ABDOMEN AND PELVIS WITH CONTRAST TECHNIQUE: Multidetector CT imaging of the chest was performed using the standard protocol during bolus administration of intravenous contrast. Multiplanar CT image reconstructions and MIPs were obtained to evaluate the vascular anatomy. Multidetector CT imaging of the abdomen and pelvis was performed using the standard protocol during bolus administration of intravenous contrast. RADIATION DOSE REDUCTION: This exam was performed according to the departmental dose-optimization program which includes automated exposure control, adjustment of the mA and/or kV according to patient size and/or use of iterative reconstruction technique. CONTRAST:  OMNIPAQUE IOHEXOL 350 MG/ML SOLN COMPARISON:  CT abdomen pelvis, 04/22/2023 FINDINGS: CT CHEST ANGIOGRAM FINDINGS Cardiovascular: Examination limited by breath motion artifact. Within this limitation, no central or proximal segmental pulmonary embolus. Evaluation of more distal vessels is nondiagnostic. Normal heart size. No pericardial effusion. Mediastinum/Nodes: No enlarged mediastinal, hilar, or axillary lymph nodes. Thyroid gland, trachea, and esophagus demonstrate no significant findings. Lungs/Pleura: Bibasilar scarring or atelectasis. Evaluation of the lung parenchyma significantly limited by breath motion artifact. No pleural effusion or pneumothorax. Musculoskeletal: No chest wall abnormality. No acute osseous findings. Review of the MIP images confirms the above findings. CT ABDOMEN PELVIS FINDINGS Evaluation limited by breath motion artifact. Hepatobiliary: No solid liver abnormality is seen. No gallstones, gallbladder wall thickening, or biliary dilatation. Pancreas: Unremarkable. No pancreatic ductal dilatation or surrounding inflammatory changes. Spleen: Normal in size without significant  abnormality. Adrenals/Urinary Tract: Adrenal glands are unremarkable. Simple, benign bilateral renal cortical cysts, including a large exophytic cyst arising from the anterior midportion of the right kidney. No obvious calculi. No hydronephrosis. Bladder is unremarkable. Stomach/Bowel: Stomach is within normal limits. Appendix appears normal. No evidence of bowel wall thickening, distention, or inflammatory changes. Descending and sigmoid diverticulosis Vascular/Lymphatic: Previously noted portal venous thrombus not clearly seen on today's examination (series 6, image 30). No enlarged abdominal or pelvic lymph nodes. Reproductive: Biopsy marking clips or fiducials in the expected vicinity of the prostate, assessment of the low pelvis limited by motion and right hip arthroplasty. Other: Midline ventral hernia containing nonobstructed mid small bowel (series 2, image 54). No ascites. Musculoskeletal: Status post right hip total arthroplasty. IMPRESSION: 1. Examination pulmonary embolism significantly limited by breath motion artifact. Within this limitation, no central or proximal segmental pulmonary embolus. 2. No acute CT findings of the chest, abdomen, or pelvis to explain sepsis within significant limitations of breath motion, patient motion, and streak artifact. 3. Descending and sigmoid diverticulosis without evidence of acute diverticulitis. 4. Midline ventral hernia containing nonobstructed mid small bowel. 5. Previously noted portal venous thrombus is not appreciated on today's examination and presumably resolved. Electronically Signed   By: Jearld Lesch M.D.   On: 05/31/2023 19:16   CT ABDOMEN PELVIS W CONTRAST Result Date: 05/31/2023 CLINICAL DATA:  Sepsis, altered mental status history of prostate cancer * Tracking Code: BO * EXAM: CT ANGIOGRAPHY CHEST CT ABDOMEN AND PELVIS WITH CONTRAST  TECHNIQUE: Multidetector CT imaging of the chest was performed using the standard protocol during bolus  administration of intravenous contrast. Multiplanar CT image reconstructions and MIPs were obtained to evaluate the vascular anatomy. Multidetector CT imaging of the abdomen and pelvis was performed using the standard protocol during bolus administration of intravenous contrast. RADIATION DOSE REDUCTION: This exam was performed according to the departmental dose-optimization program which includes automated exposure control, adjustment of the mA and/or kV according to patient size and/or use of iterative reconstruction technique. CONTRAST:  OMNIPAQUE IOHEXOL 350 MG/ML SOLN COMPARISON:  CT abdomen pelvis, 04/22/2023 FINDINGS: CT CHEST ANGIOGRAM FINDINGS Cardiovascular: Examination limited by breath motion artifact. Within this limitation, no central or proximal segmental pulmonary embolus. Evaluation of more distal vessels is nondiagnostic. Normal heart size. No pericardial effusion. Mediastinum/Nodes: No enlarged mediastinal, hilar, or axillary lymph nodes. Thyroid gland, trachea, and esophagus demonstrate no significant findings. Lungs/Pleura: Bibasilar scarring or atelectasis. Evaluation of the lung parenchyma significantly limited by breath motion artifact. No pleural effusion or pneumothorax. Musculoskeletal: No chest wall abnormality. No acute osseous findings. Review of the MIP images confirms the above findings. CT ABDOMEN PELVIS FINDINGS Evaluation limited by breath motion artifact. Hepatobiliary: No solid liver abnormality is seen. No gallstones, gallbladder wall thickening, or biliary dilatation. Pancreas: Unremarkable. No pancreatic ductal dilatation or surrounding inflammatory changes. Spleen: Normal in size without significant abnormality. Adrenals/Urinary Tract: Adrenal glands are unremarkable. Simple, benign bilateral renal cortical cysts, including a large exophytic cyst arising from the anterior midportion of the right kidney. No obvious calculi. No hydronephrosis. Bladder is unremarkable.  Stomach/Bowel: Stomach is within normal limits. Appendix appears normal. No evidence of bowel wall thickening, distention, or inflammatory changes. Descending and sigmoid diverticulosis Vascular/Lymphatic: Previously noted portal venous thrombus not clearly seen on today's examination (series 6, image 30). No enlarged abdominal or pelvic lymph nodes. Reproductive: Biopsy marking clips or fiducials in the expected vicinity of the prostate, assessment of the low pelvis limited by motion and right hip arthroplasty. Other: Midline ventral hernia containing nonobstructed mid small bowel (series 2, image 54). No ascites. Musculoskeletal: Status post right hip total arthroplasty. IMPRESSION: 1. Examination pulmonary embolism significantly limited by breath motion artifact. Within this limitation, no central or proximal segmental pulmonary embolus. 2. No acute CT findings of the chest, abdomen, or pelvis to explain sepsis within significant limitations of breath motion, patient motion, and streak artifact. 3. Descending and sigmoid diverticulosis without evidence of acute diverticulitis. 4. Midline ventral hernia containing nonobstructed mid small bowel. 5. Previously noted portal venous thrombus is not appreciated on today's examination and presumably resolved. Electronically Signed   By: Jearld Lesch M.D.   On: 05/31/2023 19:16   DG Chest Port 1 View Result Date: 05/31/2023 CLINICAL DATA:  Possible sepsis. EXAM: PORTABLE CHEST 1 VIEW COMPARISON:  April 22, 2023. FINDINGS: Stable cardiomediastinal silhouette. Both lungs are clear. The visualized skeletal structures are unremarkable. IMPRESSION: No active disease. Electronically Signed   By: Lupita Raider M.D.   On: 05/31/2023 15:42    SIGNED: Kendell Bane, MD, FHM. FAAFP. [facility] Critical care time spent - 55 min.  In seeing, evaluating and examining the patient. Reviewing medical records, labs, drawn plan of care. [facility],  Pager (please use [email or web address] to page/ text) Please use Epic Secure Chat for non-urgent communication (7AM-7PM)  If 7PM-7AM, please contact night-coverage [email or web address], 06/01/2023, 7:41 AM

## 2023-06-02 ENCOUNTER — Encounter (HOSPITAL_COMMUNITY): Payer: Self-pay | Admitting: Primary Care

## 2023-06-02 ENCOUNTER — Other Ambulatory Visit: Payer: Self-pay

## 2023-06-02 DIAGNOSIS — D649 Anemia, unspecified: Secondary | ICD-10-CM | POA: Diagnosis not present

## 2023-06-02 DIAGNOSIS — N390 Urinary tract infection, site not specified: Secondary | ICD-10-CM | POA: Diagnosis not present

## 2023-06-02 DIAGNOSIS — Z515 Encounter for palliative care: Secondary | ICD-10-CM | POA: Diagnosis not present

## 2023-06-02 DIAGNOSIS — Z8719 Personal history of other diseases of the digestive system: Secondary | ICD-10-CM

## 2023-06-02 DIAGNOSIS — R17 Unspecified jaundice: Secondary | ICD-10-CM | POA: Diagnosis not present

## 2023-06-02 DIAGNOSIS — Z7189 Other specified counseling: Secondary | ICD-10-CM | POA: Diagnosis not present

## 2023-06-02 DIAGNOSIS — A419 Sepsis, unspecified organism: Secondary | ICD-10-CM | POA: Diagnosis not present

## 2023-06-02 LAB — CBC WITH DIFFERENTIAL/PLATELET
Abs Immature Granulocytes: 0.8 10*3/uL — ABNORMAL HIGH (ref 0.00–0.07)
Band Neutrophils: 1 %
Basophils Absolute: 0 10*3/uL (ref 0.0–0.1)
Basophils Relative: 0 %
Eosinophils Absolute: 0 10*3/uL (ref 0.0–0.5)
Eosinophils Relative: 0 %
HCT: 27.2 % — ABNORMAL LOW (ref 39.0–52.0)
Hemoglobin: 8.1 g/dL — ABNORMAL LOW (ref 13.0–17.0)
Lymphocytes Relative: 5 %
Lymphs Abs: 1.3 10*3/uL (ref 0.7–4.0)
MCH: 28 pg (ref 26.0–34.0)
MCHC: 29.8 g/dL — ABNORMAL LOW (ref 30.0–36.0)
MCV: 94.1 fL (ref 80.0–100.0)
Metamyelocytes Relative: 2 %
Monocytes Absolute: 0 10*3/uL — ABNORMAL LOW (ref 0.1–1.0)
Monocytes Relative: 0 %
Myelocytes: 1 %
Neutro Abs: 24 10*3/uL — ABNORMAL HIGH (ref 1.7–7.7)
Neutrophils Relative %: 91 %
Platelets: 446 10*3/uL — ABNORMAL HIGH (ref 150–400)
RBC: 2.89 MIL/uL — ABNORMAL LOW (ref 4.22–5.81)
RDW: 24.6 % — ABNORMAL HIGH (ref 11.5–15.5)
WBC: 26.1 10*3/uL — ABNORMAL HIGH (ref 4.0–10.5)
nRBC: 0.7 % — ABNORMAL HIGH (ref 0.0–0.2)
nRBC: 1 /100{WBCs} — ABNORMAL HIGH

## 2023-06-02 LAB — COMPREHENSIVE METABOLIC PANEL
ALT: 32 U/L (ref 0–44)
AST: 23 U/L (ref 15–41)
Albumin: 1.9 g/dL — ABNORMAL LOW (ref 3.5–5.0)
Alkaline Phosphatase: 110 U/L (ref 38–126)
Anion gap: 7 (ref 5–15)
BUN: 17 mg/dL (ref 8–23)
CO2: 21 mmol/L — ABNORMAL LOW (ref 22–32)
Calcium: 8.3 mg/dL — ABNORMAL LOW (ref 8.9–10.3)
Chloride: 112 mmol/L — ABNORMAL HIGH (ref 98–111)
Creatinine, Ser: 0.68 mg/dL (ref 0.61–1.24)
GFR, Estimated: >60 mL/min (ref >60)
Glucose, Bld: 121 mg/dL — ABNORMAL HIGH (ref 70–99)
Potassium: 4.1 mmol/L (ref 3.5–5.1)
Sodium: 140 mmol/L (ref 135–145)
Total Bilirubin: 1.8 mg/dL — ABNORMAL HIGH (ref 0.0–1.2)
Total Protein: 6.4 g/dL — ABNORMAL LOW (ref 6.5–8.1)

## 2023-06-02 LAB — URINE CULTURE: Culture: NO GROWTH

## 2023-06-02 LAB — OCCULT BLOOD, POC DEVICE: Fecal Occult Bld: NEGATIVE

## 2023-06-02 LAB — BILIRUBIN, DIRECT: Bilirubin, Direct: 0.8 mg/dL — ABNORMAL HIGH (ref 0.0–0.2)

## 2023-06-02 LAB — MRSA NEXT GEN BY PCR, NASAL: MRSA by PCR Next Gen: DETECTED — AB

## 2023-06-02 MED ORDER — PANTOPRAZOLE SODIUM 40 MG IV SOLR
40.0000 mg | INTRAVENOUS | Status: DC
  Administered 2023-06-02 – 2023-06-04 (×3): 40 mg via INTRAVENOUS
  Filled 2023-06-02 (×3): qty 10

## 2023-06-02 MED ORDER — MUPIROCIN 2 % EX OINT
1.0000 | TOPICAL_OINTMENT | Freq: Two times a day (BID) | CUTANEOUS | Status: AC
  Administered 2023-06-02 – 2023-06-06 (×10): 1 via NASAL
  Filled 2023-06-02 (×4): qty 22

## 2023-06-02 NOTE — Progress Notes (Signed)
 PROGRESS NOTE    Randy Russo  ZOX:[id number] DOB: 83-Jun-1942 DOA: 05/31/2023 PCP: Assunta Found, MD    Brief Narrative:  83 year old with history of chronic anemia, GI bleeding, hypertension, BPH, diverticulosis, history of prostate cancer, COPD hypertension and portal vein thrombosis on anticoagulation brought from long-term nursing home with complaints of "sepsis labs being abnormal".  Patient was noted to be encephalopathic on presentation.  UA with possible infection.  WBC count was 26,000.  RSV was positive.  UA was abnormal.  Hemoccult was negative.  Hemoglobin was 6.1. Admitted with sepsis, UTI and anemia.  Subjective: Patient seen and examined in the morning rounds.  He is quite talkative.  Patient tells me that he lives in the hospital.  He tells me he is here because he had some pain on his back.  Remains afebrile overnight.  Blood pressures are stable.  WC count is still elevated.  Assessment & Plan:   Sepsis present on admission secondary to UTI: Patient presented encephalopathic, tachypneic and tachycardic.  WBC count 24,000.  Lactic acid was 1.5.  Likely due to combination of UTI and RSV. Blood cultures and urine cultures are negative so far.  Patient is on Zosyn, Flagyl and vancomycin.  MRSA was positive.  Given severity of symptoms, reasonable to continue vancomycin and Zosyn today.  Discontinue Flagyl. CT scan abdomen pelvis without evidence of hydronephrosis or complicated renal system. WBC count is persistently elevated, will cut down on steroids also.  COPD with RSV infection: Symptomatic management.  Breathing treatments.  Discontinue steroids.  Bronchodilators, mucolytic's.  Acute on chronic anemia of chronic disease: Presented with hemoglobin of 6.1.  Received 2 units of PRBC transfusion.  Hemoccult was negative.  Remains stable since then.  Will continue to monitor.  Patient is also on Eliquis.  Pressure injury: Stage II pressure injury present on admission.  Wound  care.  Portal vein thrombosis: Therapeutic on Eliquis.  Monitor for bleeding.  Essential hypertension: Blood pressures stable.  Holding antihypertensives.         DVT prophylaxis:  apixaban (ELIQUIS) tablet 5 mg   Code Status: DNR with limited intervention Family Communication: None.  Patient tells me he has no family. Disposition Plan: Status is: Inpatient Remains inpatient appropriate because: Treated with IV antibiotics     Consultants:  None  Procedures:  None  Antimicrobials:  Vancomycin Flagyl and Zosyn 3/8--     Objective: Vitals:   06/02/23 0700 06/02/23 0800 06/02/23 0817 06/02/23 0859  BP: 138/65 (!) 154/99    Pulse: 79 96    Resp: 16 18    Temp:   98.1 F (36.7 C)   TempSrc:   Oral   SpO2: 100% 99%  98%  Weight:      Height:        Intake/Output Summary (Last 24 hours) at 06/02/2023 0920 Last data filed at 06/02/2023 0819 Gross per 24 hour  Intake 1882.07 ml  Output 1450 ml  Net 432.07 ml   Filed Weights   05/31/23 1356 05/31/23 2226  Weight: 85.7 kg 86.2 kg    Examination:  General exam: Appears calm , talkative.  Alert awake and mostly oriented. Respiratory system: Clear to auscultation.  Dry cough on deep breathing. Cardiovascular system: S1 & S2 heard, RRR. No pedal edema. Gastrointestinal system: Soft.  Nontender.  He has a large midline incision with healthy scars.  Bowel sound present. Central nervous system: Alert and awake.  Mostly oriented.  Occasionally drifts off conversation. Extremities: Symmetric 5 x  5 power.  Gross generalized weakness.    Data Reviewed: I have personally reviewed following labs and imaging studies  CBC: Recent Labs  Lab 05/31/23 1458 06/01/23 0403 06/02/23 0440  WBC 24.8* 28.1* 26.1*  NEUTROABS 21.8*  --  24.0*  HGB 6.1* 7.5* 8.1*  HCT 20.2* 24.4* 27.2*  MCV 94.0 92.4 94.1  PLT 478* 416* 446*   Basic Metabolic Panel: Recent Labs  Lab 05/31/23 1458 06/01/23 0403 06/02/23 0440  NA 136  138 140  K 3.2* 3.4* 4.1  CL 103 114* 112*  CO2 23 20* 21*  GLUCOSE 115* 141* 121*  BUN 21 15 17   CREATININE 0.78 0.57* 0.68  CALCIUM 8.1* 7.2* 8.3*  MG  --  1.9  --   PHOS  --  3.6  --    GFR: Estimated Creatinine Clearance: 73.3 mL/min (by C-G formula based on SCr of 0.68 mg/dL). Liver Function Tests: Recent Labs  Lab 05/31/23 1458 06/01/23 0403 06/02/23 0440  AST 37 22 23  ALT 36 32 32  ALKPHOS 120 105 110  BILITOT 3.4* 2.7* 1.8*  PROT 6.4* 5.9* 6.4*  ALBUMIN 1.7* 1.6* 1.9*   No results for input(s): "LIPASE", "AMYLASE" in the last 168 hours. Recent Labs  Lab 05/31/23 1858  AMMONIA 21   Coagulation Profile: Recent Labs  Lab 05/31/23 1458  INR 1.8*   Cardiac Enzymes: No results for input(s): "CKTOTAL", "CKMB", "CKMBINDEX", "TROPONINI" in the last 168 hours. BNP (last 3 results) No results for input(s): "PROBNP" in the last 8760 hours. HbA1C: No results for input(s): "HGBA1C" in the last 72 hours. CBG: Recent Labs  Lab 05/31/23 1820  GLUCAP 133*   Lipid Profile: No results for input(s): "CHOL", "HDL", "LDLCALC", "TRIG", "CHOLHDL", "LDLDIRECT" in the last 72 hours. Thyroid Function Tests: No results for input(s): "TSH", "T4TOTAL", "FREET4", "T3FREE", "THYROIDAB" in the last 72 hours. Anemia Panel: Recent Labs    06/01/23 0748 06/01/23 1536  VITAMINB12  --  1,055*  FOLATE  --  10.4  TIBC 115*  --   IRON 24*  --    Sepsis Labs: Recent Labs  Lab 05/31/23 1458 05/31/23 1648 06/01/23 0401  PROCALCITON  --   --  3.17  LATICACIDVEN 1.5 1.5  --     Recent Results (from the past 240 hours)  Blood Culture (routine x 2)     Status: None (Preliminary result)   Collection Time: 05/31/23  2:58 PM   Specimen: Right Antecubital; Blood  Result Value Ref Range Status   Specimen Description   Final    RIGHT ANTECUBITAL BOTTLES DRAWN AEROBIC AND ANAEROBIC   Special Requests Blood Culture adequate volume  Final   Culture   Final    NO GROWTH 2  DAYS Performed at [facility], [address], [place], [place] [address]    Report Status PENDING  Incomplete  Blood Culture (routine x 2)     Status: None (Preliminary result)   Collection Time: 05/31/23  2:58 PM   Specimen: BLOOD RIGHT HAND  Result Value Ref Range Status   Specimen Description   Final    BLOOD RIGHT HAND BOTTLES DRAWN AEROBIC AND ANAEROBIC   Special Requests Blood Culture adequate volume  Final   Culture   Final    NO GROWTH 2 DAYS Performed at [facility], [address] [place], [place] [address]    Report Status PENDING  Incomplete  Resp panel by RT-PCR (RSV, Flu A&B, Covid) Anterior Nasal Swab  Status: Abnormal   Collection Time: 05/31/23  8:07 PM   Specimen: Anterior Nasal Swab  Result Value Ref Range Status   SARS Coronavirus 2 by RT PCR NEGATIVE NEGATIVE Final    Comment: (NOTE) SARS-CoV-2 target nucleic acids are NOT DETECTED.  The SARS-CoV-2 RNA is generally detectable in upper respiratory specimens during the acute phase of infection. The lowest concentration of SARS-CoV-2 viral copies this assay can detect is 138 copies/mL. A negative result does not preclude SARS-Cov-2 infection and should not be used as the sole basis for treatment or other patient management decisions. A negative result may occur with  improper specimen collection/handling, submission of specimen other than nasopharyngeal swab, presence of viral mutation(s) within the areas targeted by this assay, and inadequate number of viral copies(<138 copies/mL). A negative result must be combined with clinical observations, patient history, and epidemiological information. The expected result is Negative.  Fact Sheet for Patients:  BloggerCourse.com  Fact Sheet for Healthcare Providers:  SeriousBroker.it  This test is no t yet approved or cleared by the [place] FDA and  has been authorized for detection and/or diagnosis of  SARS-CoV-2 by FDA under an Emergency Use Authorization (EUA). This EUA will remain  in effect (meaning this test can be used) for the duration of the COVID-19 declaration under Section 564(b)(1) of the Act, 21 U.S.C.section 360bbb-3(b)(1), unless the authorization is terminated  or revoked sooner.       Influenza A by PCR NEGATIVE NEGATIVE Final   Influenza B by PCR NEGATIVE NEGATIVE Final    Comment: (NOTE) The Xpert Xpress SARS-CoV-2/FLU/RSV plus assay is intended as an aid in the diagnosis of influenza from Nasopharyngeal swab specimens and should not be used as a sole basis for treatment. Nasal washings and aspirates are unacceptable for Xpert Xpress SARS-CoV-2/FLU/RSV testing.  Fact Sheet for Patients: BloggerCourse.com  Fact Sheet for Healthcare Providers: SeriousBroker.it  This test is not yet approved or cleared by the [place] FDA and has been authorized for detection and/or diagnosis of SARS-CoV-2 by FDA under an Emergency Use Authorization (EUA). This EUA will remain in effect (meaning this test can be used) for the duration of the COVID-19 declaration under Section 564(b)(1) of the Act, 21 U.S.C. section 360bbb-3(b)(1), unless the authorization is terminated or revoked.     Resp Syncytial Virus by PCR POSITIVE (A) NEGATIVE Final    Comment: (NOTE) Fact Sheet for Patients: BloggerCourse.com  Fact Sheet for Healthcare Providers: SeriousBroker.it  This test is not yet approved or cleared by the [place] FDA and has been authorized for detection and/or diagnosis of SARS-CoV-2 by FDA under an Emergency Use Authorization (EUA). This EUA will remain in effect (meaning this test can be used) for the duration of the COVID-19 declaration under Section 564(b)(1) of the Act, 21 U.S.C. section 360bbb-3(b)(1), unless the authorization is terminated  or revoked.  Performed at [facility], [address] [place], [place] [address]   MRSA Next Gen by PCR, Nasal     Status: Abnormal   Collection Time: 05/31/23  9:11 PM   Specimen: Nasal Mucosa; Nasal Swab  Result Value Ref Range Status   MRSA by PCR Next Gen DETECTED (A) NOT DETECTED Final    Comment: RESULT CALLED TO, READ BACK BY AND VERIFIED WITH: ESTOCE,R ON 06/02/23 AT 0037 BY PURDIE,J        The GeneXpert MRSA Assay (FDA approved for NASAL specimens only), is one component of a comprehensive MRSA colonization surveillance program. It is not  intended to diagnose MRSA infection nor to guide or monitor treatment for MRSA infections. Performed at [facility], [address] [place], [place] [address]          Radiology Studies: CT Angio Chest PE W and/or Wo Contrast Result Date: 05/31/2023 CLINICAL DATA:  Sepsis, altered mental status history of prostate cancer * Tracking Code: BO * EXAM: CT ANGIOGRAPHY CHEST CT ABDOMEN AND PELVIS WITH CONTRAST TECHNIQUE: Multidetector CT imaging of the chest was performed using the standard protocol during bolus administration of intravenous contrast. Multiplanar CT image reconstructions and MIPs were obtained to evaluate the vascular anatomy. Multidetector CT imaging of the abdomen and pelvis was performed using the standard protocol during bolus administration of intravenous contrast. RADIATION DOSE REDUCTION: This exam was performed according to the departmental dose-optimization program which includes automated exposure control, adjustment of the mA and/or kV according to patient size and/or use of iterative reconstruction technique. CONTRAST:  OMNIPAQUE IOHEXOL 350 MG/ML SOLN COMPARISON:  CT abdomen pelvis, 04/22/2023 FINDINGS: CT CHEST ANGIOGRAM FINDINGS Cardiovascular: Examination limited by breath motion artifact. Within this limitation, no central or proximal segmental pulmonary embolus. Evaluation of more distal vessels is  nondiagnostic. Normal heart size. No pericardial effusion. Mediastinum/Nodes: No enlarged mediastinal, hilar, or axillary lymph nodes. Thyroid gland, trachea, and esophagus demonstrate no significant findings. Lungs/Pleura: Bibasilar scarring or atelectasis. Evaluation of the lung parenchyma significantly limited by breath motion artifact. No pleural effusion or pneumothorax. Musculoskeletal: No chest wall abnormality. No acute osseous findings. Review of the MIP images confirms the above findings. CT ABDOMEN PELVIS FINDINGS Evaluation limited by breath motion artifact. Hepatobiliary: No solid liver abnormality is seen. No gallstones, gallbladder wall thickening, or biliary dilatation. Pancreas: Unremarkable. No pancreatic ductal dilatation or surrounding inflammatory changes. Spleen: Normal in size without significant abnormality. Adrenals/Urinary Tract: Adrenal glands are unremarkable. Simple, benign bilateral renal cortical cysts, including a large exophytic cyst arising from the anterior midportion of the right kidney. No obvious calculi. No hydronephrosis. Bladder is unremarkable. Stomach/Bowel: Stomach is within normal limits. Appendix appears normal. No evidence of bowel wall thickening, distention, or inflammatory changes. Descending and sigmoid diverticulosis Vascular/Lymphatic: Previously noted portal venous thrombus not clearly seen on today's examination (series 6, image 30). No enlarged abdominal or pelvic lymph nodes. Reproductive: Biopsy marking clips or fiducials in the expected vicinity of the prostate, assessment of the low pelvis limited by motion and right hip arthroplasty. Other: Midline ventral hernia containing nonobstructed mid small bowel (series 2, image 54). No ascites. Musculoskeletal: Status post right hip total arthroplasty. IMPRESSION: 1. Examination pulmonary embolism significantly limited by breath motion artifact. Within this limitation, no central or proximal segmental pulmonary  embolus. 2. No acute CT findings of the chest, abdomen, or pelvis to explain sepsis within significant limitations of breath motion, patient motion, and streak artifact. 3. Descending and sigmoid diverticulosis without evidence of acute diverticulitis. 4. Midline ventral hernia containing nonobstructed mid small bowel. 5. Previously noted portal venous thrombus is not appreciated on today's examination and presumably resolved. Electronically Signed   By: Jearld Lesch M.D.   On: 05/31/2023 19:16   CT ABDOMEN PELVIS W CONTRAST Result Date: 05/31/2023 CLINICAL DATA:  Sepsis, altered mental status history of prostate cancer * Tracking Code: BO * EXAM: CT ANGIOGRAPHY CHEST CT ABDOMEN AND PELVIS WITH CONTRAST TECHNIQUE: Multidetector CT imaging of the chest was performed using the standard protocol during bolus administration of intravenous contrast. Multiplanar CT image reconstructions and MIPs were obtained to evaluate the vascular anatomy. Multidetector CT  imaging of the abdomen and pelvis was performed using the standard protocol during bolus administration of intravenous contrast. RADIATION DOSE REDUCTION: This exam was performed according to the departmental dose-optimization program which includes automated exposure control, adjustment of the mA and/or kV according to patient size and/or use of iterative reconstruction technique. CONTRAST:  OMNIPAQUE IOHEXOL 350 MG/ML SOLN COMPARISON:  CT abdomen pelvis, 04/22/2023 FINDINGS: CT CHEST ANGIOGRAM FINDINGS Cardiovascular: Examination limited by breath motion artifact. Within this limitation, no central or proximal segmental pulmonary embolus. Evaluation of more distal vessels is nondiagnostic. Normal heart size. No pericardial effusion. Mediastinum/Nodes: No enlarged mediastinal, hilar, or axillary lymph nodes. Thyroid gland, trachea, and esophagus demonstrate no significant findings. Lungs/Pleura: Bibasilar scarring or atelectasis. Evaluation of the lung  parenchyma significantly limited by breath motion artifact. No pleural effusion or pneumothorax. Musculoskeletal: No chest wall abnormality. No acute osseous findings. Review of the MIP images confirms the above findings. CT ABDOMEN PELVIS FINDINGS Evaluation limited by breath motion artifact. Hepatobiliary: No solid liver abnormality is seen. No gallstones, gallbladder wall thickening, or biliary dilatation. Pancreas: Unremarkable. No pancreatic ductal dilatation or surrounding inflammatory changes. Spleen: Normal in size without significant abnormality. Adrenals/Urinary Tract: Adrenal glands are unremarkable. Simple, benign bilateral renal cortical cysts, including a large exophytic cyst arising from the anterior midportion of the right kidney. No obvious calculi. No hydronephrosis. Bladder is unremarkable. Stomach/Bowel: Stomach is within normal limits. Appendix appears normal. No evidence of bowel wall thickening, distention, or inflammatory changes. Descending and sigmoid diverticulosis Vascular/Lymphatic: Previously noted portal venous thrombus not clearly seen on today's examination (series 6, image 30). No enlarged abdominal or pelvic lymph nodes. Reproductive: Biopsy marking clips or fiducials in the expected vicinity of the prostate, assessment of the low pelvis limited by motion and right hip arthroplasty. Other: Midline ventral hernia containing nonobstructed mid small bowel (series 2, image 54). No ascites. Musculoskeletal: Status post right hip total arthroplasty. IMPRESSION: 1. Examination pulmonary embolism significantly limited by breath motion artifact. Within this limitation, no central or proximal segmental pulmonary embolus. 2. No acute CT findings of the chest, abdomen, or pelvis to explain sepsis within significant limitations of breath motion, patient motion, and streak artifact. 3. Descending and sigmoid diverticulosis without evidence of acute diverticulitis. 4. Midline ventral hernia  containing nonobstructed mid small bowel. 5. Previously noted portal venous thrombus is not appreciated on today's examination and presumably resolved. Electronically Signed   By: Jearld Lesch M.D.   On: 05/31/2023 19:16   DG Chest Port 1 View Result Date: 05/31/2023 CLINICAL DATA:  Possible sepsis. EXAM: PORTABLE CHEST 1 VIEW COMPARISON:  April 22, 2023. FINDINGS: Stable cardiomediastinal silhouette. Both lungs are clear. The visualized skeletal structures are unremarkable. IMPRESSION: No active disease. Electronically Signed   By: Lupita Raider M.D.   On: 05/31/2023 15:42        Scheduled Meds:  sodium chloride   Intravenous Once   sodium chloride   Intravenous Once   apixaban  5 mg Oral BID   ascorbic acid  500 mg Oral Daily   Chlorhexidine Gluconate Cloth  6 each Topical Daily   docusate sodium  200 mg Oral Daily   feeding supplement  237 mL Oral BID BM   guaiFENesin-dextromethorphan  10 mL Oral Q8H   latanoprost  1 drop Both Eyes QHS   mupirocin ointment  1 Application Nasal BID   umeclidinium bromide  1 puff Inhalation Daily   Continuous Infusions:  piperacillin-tazobactam (ZOSYN)  IV 3.375 g (06/02/23 0500)  vancomycin Stopped (06/01/23 1953)     LOS: 2 days    Time spent: 55 minutes.    Dorcas Carrow, MD [facility]

## 2023-06-02 NOTE — TOC Progression Note (Signed)
 Transition of Care [facility]) - Progression Note    Patient Details  Name: Randy Russo MRN: [id number] Date of Birth: 07/14/1940  [facility] of [facility]) CM/SW Contact  Isabella Bowens, [place] Phone Number: 06/02/2023, 2:29 PM  Clinical Narrative:     Patient is a long-term resident at [facility]. CSW spoke with Eunice Blase about when pt would be ready to return. Debbie shared that pt could return back, but will need Auth. Berkley Harvey was started today. TOC will continue to follow.   Expected Discharge Plan: Skilled Nursing Facility Barriers to Discharge: Continued Medical Work up  Expected Discharge Plan and Services       Living arrangements for the past 2 months: Skilled Nursing Facility                                       Social Determinants of Health (SDOH) Interventions SDOH Screenings   Food Insecurity: No Food Insecurity (05/31/2023)  Recent Concern: Food Insecurity - Food Insecurity Present (04/22/2023)  Housing: Low Risk  (05/31/2023)  Recent Concern: Housing - High Risk (04/13/2023)  Transportation Needs: No Transportation Needs (05/31/2023)  Utilities: Not At Risk (05/31/2023)  Financial Resource Strain: Low Risk  (11/30/2022)   Received from [facility]  Physical Activity: Unknown (12/27/2017)  Social Connections: Patient Unable To Answer (05/31/2023)  Tobacco Use: Medium Risk (05/31/2023)  Health Literacy: Medium Risk (11/30/2022)   Received from [facility]    Readmission Risk Interventions    06/02/2023    2:28 PM 06/01/2023    4:32 PM  Readmission Risk Prevention Plan  Transportation Screening Complete Complete  PCP or Specialist Appt within 3-5 Days  Complete  HRI or Home Care Consult Complete Complete  Social Work Consult for Recovery Care Planning/Counseling Complete Complete  Palliative Care Screening  Not Applicable  Medication Review Oceanographer) Complete Complete

## 2023-06-02 NOTE — Consult Note (Signed)
 Consultation Note Date: 06/02/2023   Patient Name: Randy Russo  DOB: 12-16-1940  MRN: [id number]  Age / Sex: 83 y.o., male  PCP: Assunta Found, MD Referring Physician: Dorcas Carrow, MD  Reason for Consultation: Establishing goals of care  HPI/Patient Profile: 83 y.o. male  with past medical history of chronic anemia, GI bleeding, hypertension, BPH, diverticulosis, history of prostate cancer, COPD, portal vein thrombosis on anticoagulation, resident of [facility] under long-term care admitted on 05/31/2023 with sepsis present on admission likely secondary to UTI.   Clinical Assessment and Goals of Care: I have reviewed medical records including EPIC notes, labs and imaging, received report from RN, assessed the patient.  Randy Russo is lying in bed.  He appears acutely/chronically ill and frail.  Somewhat limited conversation today secondary to confusion and difficulty with speech possibly related to lack of dentures.  Call to granddaughter, Randy Russo, to discuss diagnosis prognosis, GOC, EOL wishes, disposition and options.  I introduced Palliative Medicine as specialized medical care for people living with serious illness. It focuses on providing relief from the symptoms and stress of a serious illness. The goal is to improve quality of life for both the patient and the family.  We discussed a brief life review of the patient.  Randy Russo had been living independently until August or September 2024.  Family shares that he has had a decline since then.  She states that his daughter was his main caregiver at that time but she moved away to [place] and Randy Russo refused to move with her.  Randy Russo attempted to have him come to [place] with her, but again he preferred to live in [place] on his own.  He had been renting a home which has been closed up.  He has been hospitalized 3 times since January 18,  and is currently under long-term care at [facility].  We then focused on their current illness.  Randy Russo and I talk in detail about UTI and the treatment plan, RSV, stage IV prostate cancer and the treatment plan, anemia and the treatment plan/recommendations from GI service, portal vein thrombosis with Eliquis being held due to acute on chronic anemia.  The natural disease trajectory and expectations at EOL were discussed.  PMT to follow  Advanced directives, concepts specific to code status, artifical feeding and hydration, and rehospitalization were considered and discussed.  Randy Russo endorses DNR, but states that her grandfather was not want to discuss his wishes for his last days.  We talked about the chronic illness pathway, what is normal and expected.  Hospice and Palliative Care services outpatient were explained and offered.  We talked about the benefits of outpatient palliative and hospice care.  Randy Russo is agreeable for "treat the treatable" hospice care at long-term care facility [facility].  Provider choice offered, she chooses [place].  Discussed the importance of continued conversation with family and the medical providers regarding overall plan of care and treatment options, ensuring decisions are within the context of the patient's values and GOCs.  Questions and concerns were addressed.  The family was encouraged to call with questions or concerns.  PMT will continue to support holistically.  Conference with attending, bedside nursing staff, transition of care team related to patient condition, needs, goals of care, disposition.   HCPOA NEXT OF KIN -granddaughter, Randy Russo.  Randy Russo has a son and a daughter but Randy Russo is the healthcare agent.    SUMMARY OF RECOMMENDATIONS   Continue to treat the treatable but no CPR or intubation Optimize treatment Return to [facility] under long-term care/skillable days if qualified "Treat the treatable" hospice care at  long-term care   Code Status/Advance Care Planning: DNR  Symptom Management:  Per hospitalist, no additional needs at this time.  Palliative Prophylaxis:  Frequent Pain Assessment, Oral Care, and Turn Reposition  Additional Recommendations (Limitations, Scope, Preferences): Continue to treat, time for outcomes  Psycho-social/Spiritual:  Desire for further Chaplaincy support:no Additional Recommendations: Caregiving  Support/Resources  Prognosis:  Unable to determine, based on outcomes.  Guarded at this point.  Discharge Planning: Anticipate return to [facility] under long-term care with skillable days/rehab       Primary Diagnoses: Present on Admission:  Sepsis secondary to UTI [facility])  Acute anemia  COPD (chronic obstructive pulmonary disease) (HCC)  Essential hypertension  Pressure injury of skin  Portal vein thrombosis  Gastroesophageal reflux disease without esophagitis  Ambulatory dysfunction  RSV infection   I have reviewed the medical record, interviewed the patient and family, and examined the patient. The following aspects are pertinent.  Past Medical History:  Diagnosis Date   Arthritis    Cancer [facility])    Prostate   COPD (chronic obstructive pulmonary disease) (HCC)    Diverticulitis    Falls frequently    Hypertension    Lower GI bleed 12/27/2017   Social History   Socioeconomic History   Marital status: Married    Spouse name: Not on file   Number of children: Not on file   Years of education: Not on file   Highest education level: Not on file  Occupational History   Not on file  Tobacco Use   Smoking status: Former    Types: Cigars   Smokeless tobacco: Never  Vaping Use   Vaping status: Never Used  Substance and Sexual Activity   Alcohol use: No   Drug use: No   Sexual activity: Not Currently  Other Topics Concern   Not on file  Social History Narrative   Not on file   Social Drivers of Health   Financial Resource Strain:  Low Risk  (11/30/2022)   Received from [facility]   Overall Financial Resource Strain (CARDIA)    Difficulty of Paying Living Expenses: Not very hard  Food Insecurity: No Food Insecurity (05/31/2023)   Hunger Vital Sign    Worried About Running Out of Food in the Last Year: Never true    Ran Out of Food in the Last Year: Never true  Recent Concern: Food Insecurity - Food Insecurity Present (04/22/2023)   Hunger Vital Sign    Worried About Running Out of Food in the Last Year: Never true    Ran Out of Food in the Last Year: Sometimes true  Transportation Needs: No Transportation Needs (05/31/2023)   PRAPARE - Administrator, Civil Service (Medical): No    Lack of Transportation (Non-Medical): No  Physical Activity: Unknown (12/27/2017)   Exercise Vital Sign    Days of Exercise per Week:  Patient declined    Minutes of Exercise per Session: Patient declined  Stress: Not on file  Social Connections: Patient Unable To Answer (05/31/2023)   Social Connection and Isolation Panel [NHANES]    Frequency of Communication with Friends and Family: Patient unable to answer    Frequency of Social Gatherings with Friends and Family: Patient unable to answer    Attends Religious Services: Patient unable to answer    Active Member of Clubs or Organizations: Patient unable to answer    Attends Banker Meetings: Patient unable to answer    Marital Status: Patient unable to answer   Family History  Problem Relation Age of Onset   Diabetes Mother    Dementia Father    Scheduled Meds:  sodium chloride   Intravenous Once   sodium chloride   Intravenous Once   apixaban  5 mg Oral BID   ascorbic acid  500 mg Oral Daily   Chlorhexidine Gluconate Cloth  6 each Topical Daily   docusate sodium  200 mg Oral Daily   feeding supplement  237 mL Oral BID BM   guaiFENesin-dextromethorphan  10 mL Oral Q8H   latanoprost  1 drop Both Eyes QHS   mupirocin ointment  1 Application Nasal BID    pantoprazole (PROTONIX) IV  40 mg Intravenous Q24H   umeclidinium bromide  1 puff Inhalation Daily   Continuous Infusions:  piperacillin-tazobactam (ZOSYN)  IV 3.375 g (06/02/23 1422)   vancomycin Stopped (06/01/23 1953)   PRN Meds:.acetaminophen, albuterol, ondansetron **OR** ondansetron (ZOFRAN) IV, mouth rinse Medications Prior to Admission:  Prior to Admission medications   Medication Sig Start Date End Date Taking? Authorizing Provider  acetaminophen (TYLENOL) 325 MG tablet Take 650 mg by mouth every 6 (six) hours as needed for mild pain (pain score 1-3). Do not exceed 3,000 mg in 24 hours   Yes [provider]  albuterol (PROVENTIL) (2.5 MG/3ML) 0.083% nebulizer solution Take 3 mLs (2.5 mg total) by nebulization every 6 (six) hours as needed for wheezing or shortness of breath. 04/14/23  Yes Vassie Loll, MD  apixaban (ELIQUIS) 5 MG TABS tablet Take 2 tablets (10 mg total) by mouth 2 (two) times daily for 6 days, THEN 1 tablet (5 mg total) 2 (two) times daily. 04/25/23 05/31/23 Yes Shahmehdi, Gemma Payor, MD  B Complex-C (B-COMPLEX WITH VITAMIN C) tablet Take 1 tablet by mouth daily. 04/15/23  Yes Vassie Loll, MD  Cholecalciferol 25 MCG (1000 UT) capsule Take 1 capsule (1,000 Units total) by mouth daily. 04/14/23  Yes Vassie Loll, MD  docusate sodium (COLACE) 100 MG capsule Take 2 capsules (200 mg total) by mouth daily. 04/25/23  Yes Shahmehdi, Gemma Payor, MD  ferrous sulfate 325 (65 FE) MG tablet Take 1 tablet (325 mg total) by mouth 2 (two) times daily with a meal. 04/25/23 06/24/23 Yes Shahmehdi, Seyed A, MD  furosemide (LASIX) 20 MG tablet Take 20 mg by mouth daily.   Yes [provider]  latanoprost (XALATAN) 0.005 % ophthalmic solution Place 1 drop into both eyes at bedtime.   Yes [provider]  loratadine (CLARITIN) 10 MG tablet Take 1 tablet (10 mg total) by mouth daily. 04/15/23  Yes Vassie Loll, MD  nitrofurantoin, macrocrystal-monohydrate, (MACROBID) 100 MG  capsule Take 100 mg by mouth 2 (two) times daily. 05/25/23  Yes [provider]  Nutritional Supplements (NUTRITIONAL SUPPLEMENT PLUS) LIQD Take 120 mLs by mouth daily.   Yes [provider]  SPIRIVA RESPIMAT 2.5 MCG/ACT  AERS Inhale 2 puffs into the lungs daily. 04/14/23  Yes [provider]   No Known Allergies Review of Systems  Unable to perform ROS: Acuity of condition    Physical Exam Vitals and nursing note reviewed.  Pulmonary:     Effort: Pulmonary effort is normal. No respiratory distress.  Skin:    General: Skin is warm and dry.  Neurological:     Mental Status: He is alert.  Psychiatric:     Comments: Calm, not fearful     Vital Signs: BP 136/62   Pulse 82   Temp 97.9 F (36.6 C) (Oral)   Resp (!) 21   Ht 5\' 6"  (1.676 m)   Wt 86.2 kg   SpO2 99%   BMI 30.67 kg/m  Pain Scale: 0-10   Pain Score: 0-No pain   SpO2: SpO2: 99 % O2 Device:SpO2: 99 % O2 Flow Rate: .O2 Flow Rate (L/min): 15 L/min  IO: Intake/output summary:  Intake/Output Summary (Last 24 hours) at 06/02/2023 1432 Last data filed at 06/02/2023 1247 Gross per 24 hour  Intake 1067.31 ml  Output 1750 ml  Net -682.69 ml    LBM: Last BM Date : 06/01/23 Baseline Weight: Weight: 85.7 kg Most recent weight: Weight: 86.2 kg     Palliative Assessment/Data:     Time In: 1330 Time Out: 1445 Time Total: 75 minutes  Greater than 50%  of this time was spent counseling and coordinating care related to the above assessment and plan.  Signed by: Katheran Awe, NP   Please contact Palliative Medicine Team phone at [phone] for questions and concerns.  For individual provider: See Loretha Stapler

## 2023-06-02 NOTE — Consult Note (Signed)
 Gastroenterology Consult   Referring Provider: Dr. Flossie Dibble Primary Care Physician:  Assunta Found, MD Primary Gastroenterologist:  Dr. Levon Hedger   Patient ID: Randy Russo; [id number]; 31-Jul-1940   Admit date: 05/31/2023  LOS: 2 days   Date of Consultation: 06/02/2023  Reason for Consultation:  Anemia with Hgb 6.1   History of Present Illness   Randy Russo is an 83 y.o. 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 GI now consulted due to acute on chronic anemia.   He was last seen at [facility] on 05/20/23 for routine follow-up and was febrile. Plans for colonoscopy/EGD as outpatient once over acute illness. Chronic anemia felt to be multifactorial with trend towards ID.   In the ED: Hgb 6.1, down from 9/10 baseline and last Hgb as outpatient 10.8. iron low at 24, Tbili 3.4 but transaminases normal. INR 1.8, blood cultures collected with no growth thus far, heme negative. Ammonia level normal at 21.   Received 2 units PRBCs with improvement in Hgb to 7.5.   CT abd/pelvis with contrast: no acute findings, full report below. He does have a midline ventral hernia that contains nonobstructed mid small bowel,  CTA for PE: significantly limited by motion    Patient denies abdominal pain, N/V, overt GI bleeding. Difficult to obtain any significant history from him. He chatters and is difficult to understand with edentulous status. Unable to provide meaningful history. Nursing states this is consistent. Unclear baseline mental status.   Last EGD:12/2017- Normal esophagus.                           - Z-line irregular, 40 cm from the incisors.                           - Non-bleeding erosive gastropathy.                           - Normal duodenal bulb and second portion of the                             duodenum.                           - No specimens collected. Last Colonoscopy: 12/2017- Diverticulosis in the sigmoid colon, at the                            hepatic flexure, in the ascending colon and in the                            cecum.                           - External hemorrhoids.                           - No specimens collected.  Comment: Colonic diverticular bleed.   Past Medical History:  Diagnosis Date   Arthritis    Cancer [facility])    Prostate   COPD (chronic obstructive pulmonary disease) (HCC)    Diverticulitis    Falls frequently    Hypertension    Lower GI bleed 12/27/2017    Past Surgical History:  Procedure Laterality Date   CATARACT EXTRACTION W/PHACO Left 11/16/2012   Procedure: CATARACT EXTRACTION PHACO AND INTRAOCULAR LENS PLACEMENT (IOC);  Surgeon: Gemma Payor, MD;  Location: [facility];  Service: Ophthalmology;  Laterality: Left;  CDE: 13.66   CATARACT EXTRACTION W/PHACO Right 01/18/2013   Procedure: CATARACT EXTRACTION PHACO AND INTRAOCULAR LENS PLACEMENT (IOC);  Surgeon: Gemma Payor, MD;  Location: [facility];  Service: Ophthalmology;  Laterality: Right;  CDE:8.79   CERVICAL FUSION  4 yrs ago   COLONOSCOPY N/A 12/28/2017   Procedure: COLONOSCOPY;  Surgeon: Malissa Hippo, MD;  Location: [facility];  Service: Endoscopy;  Laterality: N/A;   COLONOSCOPY N/A 01/01/2018   Procedure: COLONOSCOPY;  Surgeon: Malissa Hippo, MD;  Location: [facility];  Service: Endoscopy;  Laterality: N/A;   ESOPHAGOGASTRODUODENOSCOPY N/A 01/01/2018   Procedure: ESOPHAGOGASTRODUODENOSCOPY (EGD);  Surgeon: Malissa Hippo, MD;  Location: [facility];  Service: Endoscopy;  Laterality: N/A;   INGUINAL HERNIA REPAIR Right 03/12/2021   Procedure: HERNIA REPAIR INGUINAL ADULT;  Surgeon: Franky Macho, MD;  Location: [facility];  Service: General;  Laterality: Right;   JOINT REPLACEMENT Left 5 yrs ago   knee   PARTIAL COLECTOMY N/A  01/01/2018   Procedure: PARTIAL COLECTOMY;  Surgeon: Franky Macho, MD;  Location: [facility];  Service: General;  Laterality: N/A;   right hip replacement Right 3 yrs ago    Prior to Admission medications   Medication Sig Start Date End Date Taking? Authorizing Provider  acetaminophen (TYLENOL) 325 MG tablet Take 650 mg by mouth every 6 (six) hours as needed for mild pain (pain score 1-3). Do not exceed 3,000 mg in 24 hours   Yes [provider]  albuterol (PROVENTIL) (2.5 MG/3ML) 0.083% nebulizer solution Take 3 mLs (2.5 mg total) by nebulization every 6 (six) hours as needed for wheezing or shortness of breath. 04/14/23  Yes Vassie Loll, MD  apixaban (ELIQUIS) 5 MG TABS tablet Take 2 tablets (10 mg total) by mouth 2 (two) times daily for 6 days, THEN 1 tablet (5 mg total) 2 (two) times daily. 04/25/23 05/31/23 Yes Shahmehdi, Gemma Payor, MD  B Complex-C (B-COMPLEX WITH VITAMIN C) tablet Take 1 tablet by mouth daily. 04/15/23  Yes Vassie Loll, MD  Cholecalciferol 25 MCG (1000 UT) capsule Take 1 capsule (1,000 Units total) by mouth daily. 04/14/23  Yes Vassie Loll, MD  docusate sodium (COLACE) 100 MG capsule Take 2 capsules (200 mg total) by mouth daily. 04/25/23  Yes Shahmehdi, Gemma Payor, MD  ferrous sulfate 325 (65 FE) MG tablet Take 1 tablet (325 mg total) by mouth 2 (two) times daily with a meal. 04/25/23 06/24/23 Yes Shahmehdi, Seyed A, MD  furosemide (LASIX) 20 MG tablet Take 20 mg by mouth daily.   Yes [provider]  latanoprost (XALATAN) 0.005 % ophthalmic solution Place 1 drop into both eyes at bedtime.   Yes [provider]  loratadine (CLARITIN) 10 MG tablet Take 1 tablet (10 mg total) by mouth daily. 04/15/23  Yes Vassie Loll, MD  nitrofurantoin, macrocrystal-monohydrate, (MACROBID) 100 MG capsule Take 100 mg by mouth 2 (two) times daily. 05/25/23  Yes [provider]  Nutritional Supplements (NUTRITIONAL SUPPLEMENT PLUS) LIQD Take 120 mLs by mouth daily.    Yes [provider]  SPIRIVA RESPIMAT 2.5 MCG/ACT AERS Inhale 2 puffs into the lungs daily. 04/14/23  Yes [provider]    Current Facility-Administered Medications  Medication Dose Route Frequency Provider Last Rate Last Admin   0.9 %  sodium chloride infusion (Manually program via Guardrails IV Fluids)   Intravenous Once Levie Heritage, DO   Held at 05/31/23 1903   0.9 %  sodium chloride infusion (Manually program via Guardrails IV Fluids)   Intravenous Once Levie Heritage, DO   Held at 05/31/23 2158   acetaminophen (TYLENOL) tablet 650 mg  650 mg Oral Q6H PRN Shahmehdi, Gemma Payor, MD       albuterol (PROVENTIL) (2.5 MG/3ML) 0.083% nebulizer solution 2.5 mg  2.5 mg Nebulization Q2H PRN Levie Heritage, DO       apixaban Everlene Balls) tablet 5 mg  5 mg Oral BID Levie Heritage, DO   5 mg at 06/01/23 2137   ascorbic acid (VITAMIN C) tablet 500 mg  500 mg Oral Daily Nevin Bloodgood A, MD   500 mg at 06/01/23 0815   Chlorhexidine Gluconate Cloth 2 % PADS 6 each  6 each Topical Daily Levie Heritage, DO   6 each at 06/01/23 1610   docusate sodium (COLACE) capsule 200 mg  200 mg Oral Daily Levie Heritage, DO   200 mg at 06/01/23 9604   feeding supplement (ENSURE ENLIVE / ENSURE PLUS) liquid 237 mL  237 mL Oral BID BM Levie Heritage, DO   237 mL at 06/01/23 1558   guaiFENesin-dextromethorphan (ROBITUSSIN DM) 100-10 MG/5ML syrup 10 mL  10 mL Oral Q8H Shahmehdi, Seyed A, MD   10 mL at 06/02/23 0455   latanoprost (XALATAN) 0.005 % ophthalmic solution 1 drop  1 drop Both Eyes QHS Levie Heritage, DO   1 drop at 06/01/23 2144   methylPREDNISolone sodium succinate (SOLU-MEDROL) 40 mg/mL injection 40 mg  40 mg Intravenous Q12H Shahmehdi, Guadalupe Maple A, MD   40 mg at 06/01/23 2137   mupirocin ointment (BACTROBAN) 2 % 1 Application  1 Application Nasal BID Frankey Shown, DO   1 Application at 06/02/23 0455   ondansetron (ZOFRAN) tablet 4 mg  4 mg Oral Q6H PRN Levie Heritage, DO        Or   ondansetron [facility]) injection 4 mg  4 mg Intravenous Q6H PRN Levie Heritage, DO       Oral care mouth rinse  15 mL Mouth Rinse PRN Levie Heritage, DO       piperacillin-tazobactam (ZOSYN) IVPB 3.375 g  3.375 g Intravenous Q8H Shahmehdi, Seyed A, MD 12.5 mL/hr at 06/02/23 0500 3.375 g at 06/02/23 0500   umeclidinium bromide (INCRUSE ELLIPTA) 62.5 MCG/ACT 1 puff  1 puff Inhalation Daily Levie Heritage, DO       vancomycin (VANCOREADY) IVPB 1750 mg/350 mL  1,750 mg Intravenous Q24H Levie Heritage, DO   Stopped at 06/01/23 1953    Allergies as of 05/31/2023   (No Known Allergies)    Family History  Problem Relation Age of Onset   Diabetes Mother    Dementia Father     Social History   Socioeconomic History   Marital status: Married    Spouse name: Not on file   Number of children: Not on file   Years of education: Not on file  Highest education level: Not on file  Occupational History   Not on file  Tobacco Use   Smoking status: Former    Types: Cigars   Smokeless tobacco: Never  Vaping Use   Vaping status: Never Used  Substance and Sexual Activity   Alcohol use: No   Drug use: No   Sexual activity: Not Currently  Other Topics Concern   Not on file  Social History Narrative   Not on file   Social Drivers of Health   Financial Resource Strain: Low Risk  (11/30/2022)   Received from [facility]   Overall Financial Resource Strain (CARDIA)    Difficulty of Paying Living Expenses: Not very hard  Food Insecurity: No Food Insecurity (05/31/2023)   Hunger Vital Sign    Worried About Running Out of Food in the Last Year: Never true    Ran Out of Food in the Last Year: Never true  Recent Concern: Food Insecurity - Food Insecurity Present (04/22/2023)   Hunger Vital Sign    Worried About Running Out of Food in the Last Year: Never true    Ran Out of Food in the Last Year: Sometimes true  Transportation Needs: No Transportation Needs (05/31/2023)   PRAPARE -  Administrator, Civil Service (Medical): No    Lack of Transportation (Non-Medical): No  Physical Activity: Unknown (12/27/2017)   Exercise Vital Sign    Days of Exercise per Week: Patient declined    Minutes of Exercise per Session: Patient declined  Stress: Not on file  Social Connections: Patient Unable To Answer (05/31/2023)   Social Connection and Isolation Panel [NHANES]    Frequency of Communication with Friends and Family: Patient unable to answer    Frequency of Social Gatherings with Friends and Family: Patient unable to answer    Attends Religious Services: Patient unable to answer    Active Member of Clubs or Organizations: Patient unable to answer    Attends Banker Meetings: Patient unable to answer    Marital Status: Patient unable to answer  Intimate Partner Violence: Patient Unable To Answer (05/31/2023)   Humiliation, Afraid, Rape, and Kick questionnaire    Fear of Current or Ex-Partner: Patient unable to answer    Emotionally Abused: Patient unable to answer    Physically Abused: Patient unable to answer    Sexually Abused: Patient unable to answer     Review of Systems   Limited due to mental status.   Physical Exam   Vital Signs in last 24 hours: Temp:  [98 F (36.7 C)-98.4 F (36.9 C)] 98.1 F (36.7 C) (03/10 0817) Pulse Rate:  [74-96] 96 (03/10 0800) Resp:  [13-29] 18 (03/10 0800) BP: (118-154)/(27-99) 154/99 (03/10 0800) SpO2:  [97 %-100 %] 99 % (03/10 0800) Last BM Date : 06/01/23 (Per pt)  General:   Alert,  oriented to person, Well-developed, well-nourished,  Head:  Normocephalic and atraumatic. Eyes:  Sclera clear, no icterus.    Mouth:  Edentulous Neck:  Supple; no masses Lungs:  scattered rhonchi bilaterally Heart:  S1 S2 present without murmurs Abdomen:  Soft, nontender and nondistended. No rebound or guarding Rectal: deferred   Msk:  Symmetrical without gross deformities. Normal posture. Extremities:  1+ lower  extremity edema Neurologic:  Alert and  oriented to person and place, not situation   Intake/Output from previous day: 03/09 0701 - 03/10 0700 In: 2233.5 [P.O.:480; I.V.:1271.6; IV Piggyback:481.9] Out: 1850 [Urine:1850] Intake/Output this shift: Total I/O In:  120 [P.O.:120] Out: -    Labs/Studies   Recent Labs Recent Labs    05/31/23 1458 06/01/23 0403 06/02/23 0440  WBC 24.8* 28.1* 26.1*  HGB 6.1* 7.5* 8.1*  HCT 20.2* 24.4* 27.2*  PLT 478* 416* 446*   BMET Recent Labs    05/31/23 1458 06/01/23 0403 06/02/23 0440  NA 136 138 140  K 3.2* 3.4* 4.1  CL 103 114* 112*  CO2 23 20* 21*  GLUCOSE 115* 141* 121*  BUN 21 15 17   CREATININE 0.78 0.57* 0.68  CALCIUM 8.1* 7.2* 8.3*   LFT Recent Labs    05/31/23 1458 06/01/23 0403 06/02/23 0440  PROT 6.4* 5.9* 6.4*  ALBUMIN 1.7* 1.6* 1.9*  AST 37 22 23  ALT 36 32 32  ALKPHOS 120 105 110  BILITOT 3.4* 2.7* 1.8*   PT/INR Recent Labs    05/31/23 1458  LABPROT 21.3*  INR 1.8*     Radiology/Studies CT Angio Chest PE W and/or Wo Contrast Result Date: 05/31/2023 CLINICAL DATA:  Sepsis, altered mental status history of prostate cancer * Tracking Code: BO * EXAM: CT ANGIOGRAPHY CHEST CT ABDOMEN AND PELVIS WITH CONTRAST TECHNIQUE: Multidetector CT imaging of the chest was performed using the standard protocol during bolus administration of intravenous contrast. Multiplanar CT image reconstructions and MIPs were obtained to evaluate the vascular anatomy. Multidetector CT imaging of the abdomen and pelvis was performed using the standard protocol during bolus administration of intravenous contrast. RADIATION DOSE REDUCTION: This exam was performed according to the departmental dose-optimization program which includes automated exposure control, adjustment of the mA and/or kV according to patient size and/or use of iterative reconstruction technique. CONTRAST:  OMNIPAQUE IOHEXOL 350 MG/ML SOLN COMPARISON:  CT abdomen  pelvis, 04/22/2023 FINDINGS: CT CHEST ANGIOGRAM FINDINGS Cardiovascular: Examination limited by breath motion artifact. Within this limitation, no central or proximal segmental pulmonary embolus. Evaluation of more distal vessels is nondiagnostic. Normal heart size. No pericardial effusion. Mediastinum/Nodes: No enlarged mediastinal, hilar, or axillary lymph nodes. Thyroid gland, trachea, and esophagus demonstrate no significant findings. Lungs/Pleura: Bibasilar scarring or atelectasis. Evaluation of the lung parenchyma significantly limited by breath motion artifact. No pleural effusion or pneumothorax. Musculoskeletal: No chest wall abnormality. No acute osseous findings. Review of the MIP images confirms the above findings. CT ABDOMEN PELVIS FINDINGS Evaluation limited by breath motion artifact. Hepatobiliary: No solid liver abnormality is seen. No gallstones, gallbladder wall thickening, or biliary dilatation. Pancreas: Unremarkable. No pancreatic ductal dilatation or surrounding inflammatory changes. Spleen: Normal in size without significant abnormality. Adrenals/Urinary Tract: Adrenal glands are unremarkable. Simple, benign bilateral renal cortical cysts, including a large exophytic cyst arising from the anterior midportion of the right kidney. No obvious calculi. No hydronephrosis. Bladder is unremarkable. Stomach/Bowel: Stomach is within normal limits. Appendix appears normal. No evidence of bowel wall thickening, distention, or inflammatory changes. Descending and sigmoid diverticulosis Vascular/Lymphatic: Previously noted portal venous thrombus not clearly seen on today's examination (series 6, image 30). No enlarged abdominal or pelvic lymph nodes. Reproductive: Biopsy marking clips or fiducials in the expected vicinity of the prostate, assessment of the low pelvis limited by motion and right hip arthroplasty. Other: Midline ventral hernia containing nonobstructed mid small bowel (series 2, image 54). No  ascites. Musculoskeletal: Status post right hip total arthroplasty. IMPRESSION: 1. Examination pulmonary embolism significantly limited by breath motion artifact. Within this limitation, no central or proximal segmental pulmonary embolus. 2. No acute CT findings of the chest, abdomen, or pelvis to explain sepsis within significant limitations  of breath motion, patient motion, and streak artifact. 3. Descending and sigmoid diverticulosis without evidence of acute diverticulitis. 4. Midline ventral hernia containing nonobstructed mid small bowel. 5. Previously noted portal venous thrombus is not appreciated on today's examination and presumably resolved. Electronically Signed   By: Jearld Lesch M.D.   On: 05/31/2023 19:16   CT ABDOMEN PELVIS W CONTRAST Result Date: 05/31/2023 CLINICAL DATA:  Sepsis, altered mental status history of prostate cancer * Tracking Code: BO * EXAM: CT ANGIOGRAPHY CHEST CT ABDOMEN AND PELVIS WITH CONTRAST TECHNIQUE: Multidetector CT imaging of the chest was performed using the standard protocol during bolus administration of intravenous contrast. Multiplanar CT image reconstructions and MIPs were obtained to evaluate the vascular anatomy. Multidetector CT imaging of the abdomen and pelvis was performed using the standard protocol during bolus administration of intravenous contrast. RADIATION DOSE REDUCTION: This exam was performed according to the departmental dose-optimization program which includes automated exposure control, adjustment of the mA and/or kV according to patient size and/or use of iterative reconstruction technique. CONTRAST:  OMNIPAQUE IOHEXOL 350 MG/ML SOLN COMPARISON:  CT abdomen pelvis, 04/22/2023 FINDINGS: CT CHEST ANGIOGRAM FINDINGS Cardiovascular: Examination limited by breath motion artifact. Within this limitation, no central or proximal segmental pulmonary embolus. Evaluation of more distal vessels is nondiagnostic. Normal heart size. No pericardial  effusion. Mediastinum/Nodes: No enlarged mediastinal, hilar, or axillary lymph nodes. Thyroid gland, trachea, and esophagus demonstrate no significant findings. Lungs/Pleura: Bibasilar scarring or atelectasis. Evaluation of the lung parenchyma significantly limited by breath motion artifact. No pleural effusion or pneumothorax. Musculoskeletal: No chest wall abnormality. No acute osseous findings. Review of the MIP images confirms the above findings. CT ABDOMEN PELVIS FINDINGS Evaluation limited by breath motion artifact. Hepatobiliary: No solid liver abnormality is seen. No gallstones, gallbladder wall thickening, or biliary dilatation. Pancreas: Unremarkable. No pancreatic ductal dilatation or surrounding inflammatory changes. Spleen: Normal in size without significant abnormality. Adrenals/Urinary Tract: Adrenal glands are unremarkable. Simple, benign bilateral renal cortical cysts, including a large exophytic cyst arising from the anterior midportion of the right kidney. No obvious calculi. No hydronephrosis. Bladder is unremarkable. Stomach/Bowel: Stomach is within normal limits. Appendix appears normal. No evidence of bowel wall thickening, distention, or inflammatory changes. Descending and sigmoid diverticulosis Vascular/Lymphatic: Previously noted portal venous thrombus not clearly seen on today's examination (series 6, image 30). No enlarged abdominal or pelvic lymph nodes. Reproductive: Biopsy marking clips or fiducials in the expected vicinity of the prostate, assessment of the low pelvis limited by motion and right hip arthroplasty. Other: Midline ventral hernia containing nonobstructed mid small bowel (series 2, image 54). No ascites. Musculoskeletal: Status post right hip total arthroplasty. IMPRESSION: 1. Examination pulmonary embolism significantly limited by breath motion artifact. Within this limitation, no central or proximal segmental pulmonary embolus. 2. No acute CT findings of the chest,  abdomen, or pelvis to explain sepsis within significant limitations of breath motion, patient motion, and streak artifact. 3. Descending and sigmoid diverticulosis without evidence of acute diverticulitis. 4. Midline ventral hernia containing nonobstructed mid small bowel. 5. Previously noted portal venous thrombus is not appreciated on today's examination and presumably resolved. Electronically Signed   By: Jearld Lesch M.D.   On: 05/31/2023 19:16   DG Chest Port 1 View Result Date: 05/31/2023 CLINICAL DATA:  Possible sepsis. EXAM: PORTABLE CHEST 1 VIEW COMPARISON:  April 22, 2023. FINDINGS: Stable cardiomediastinal silhouette. Both lungs are clear. The visualized skeletal structures are unremarkable. IMPRESSION: No active disease. Electronically Signed   By: Roque Lias  Jr M.D.   On: 05/31/2023 15:42     Assessment   Randy Russo is an 83 y.o. 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 GI now consulted due to acute on chronic anemia. Heme negative in ED.   Acute on chronic anemia: no overt GI bleeding, heme negative, and Hgb improved to 7.5 s/p 2 units PRBCs initially and now 8.1 this morning. Last EGD and colonoscopy in 2019, performed due to acute diverticular bleeding and underwent right hemicolectomy while inpatient. In setting of Eliquis, could be bleeding anywhere in GI tract. He does have a documented IDA component. Recommend colonoscopy/EGD once he clinically improves from acute illness.    Elevated Tbili: improving. 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 Will fractionate bilirubin.   Difficult historian. Recommend POA to sign consent once he is appropriate for procedures. Unclear his baseline mental status.    Plan / Recommendations    Recommend holding Eliquis for now Add PPI daily Will  reassess through hospitalization and plan on colonoscopy/EGD prior to discharge when clinically appropriate.  Fractionate bilirubin     06/02/2023, 8:25 AM  Gelene Mink, PhD, [facility] Gastroenterology

## 2023-06-03 DIAGNOSIS — R17 Unspecified jaundice: Secondary | ICD-10-CM | POA: Diagnosis not present

## 2023-06-03 DIAGNOSIS — D649 Anemia, unspecified: Secondary | ICD-10-CM | POA: Diagnosis not present

## 2023-06-03 DIAGNOSIS — N390 Urinary tract infection, site not specified: Secondary | ICD-10-CM | POA: Diagnosis not present

## 2023-06-03 DIAGNOSIS — A419 Sepsis, unspecified organism: Secondary | ICD-10-CM | POA: Diagnosis not present

## 2023-06-03 DIAGNOSIS — Z515 Encounter for palliative care: Secondary | ICD-10-CM | POA: Diagnosis not present

## 2023-06-03 DIAGNOSIS — Z7901 Long term (current) use of anticoagulants: Secondary | ICD-10-CM

## 2023-06-03 DIAGNOSIS — Z7189 Other specified counseling: Secondary | ICD-10-CM | POA: Diagnosis not present

## 2023-06-03 LAB — CBC WITH DIFFERENTIAL/PLATELET
Abs Immature Granulocytes: 0.9 10*3/uL — ABNORMAL HIGH (ref 0.00–0.07)
Band Neutrophils: 2 %
Basophils Absolute: 0 10*3/uL (ref 0.0–0.1)
Basophils Relative: 0 %
Eosinophils Absolute: 0 10*3/uL (ref 0.0–0.5)
Eosinophils Relative: 0 %
HCT: 28.8 % — ABNORMAL LOW (ref 39.0–52.0)
Hemoglobin: 8.7 g/dL — ABNORMAL LOW (ref 13.0–17.0)
Lymphocytes Relative: 5 %
Lymphs Abs: 1.2 10*3/uL (ref 0.7–4.0)
MCH: 28.5 pg (ref 26.0–34.0)
MCHC: 30.2 g/dL (ref 30.0–36.0)
MCV: 94.4 fL (ref 80.0–100.0)
Metamyelocytes Relative: 1 %
Monocytes Absolute: 2.5 10*3/uL — ABNORMAL HIGH (ref 0.1–1.0)
Monocytes Relative: 11 %
Myelocytes: 1 %
Neutro Abs: 18.4 10*3/uL — ABNORMAL HIGH (ref 1.7–7.7)
Neutrophils Relative %: 78 %
Platelets: 458 10*3/uL — ABNORMAL HIGH (ref 150–400)
Promyelocytes Relative: 2 %
RBC: 3.05 MIL/uL — ABNORMAL LOW (ref 4.22–5.81)
RDW: 25.3 % — ABNORMAL HIGH (ref 11.5–15.5)
WBC: 23 10*3/uL — ABNORMAL HIGH (ref 4.0–10.5)
nRBC: 1 /100{WBCs} — ABNORMAL HIGH
nRBC: 1.7 % — ABNORMAL HIGH (ref 0.0–0.2)

## 2023-06-03 LAB — COMPREHENSIVE METABOLIC PANEL
ALT: 45 U/L — ABNORMAL HIGH (ref 0–44)
AST: 31 U/L (ref 15–41)
Albumin: 1.8 g/dL — ABNORMAL LOW (ref 3.5–5.0)
Alkaline Phosphatase: 117 U/L (ref 38–126)
Anion gap: 6 (ref 5–15)
BUN: 13 mg/dL (ref 8–23)
CO2: 22 mmol/L (ref 22–32)
Calcium: 8.1 mg/dL — ABNORMAL LOW (ref 8.9–10.3)
Chloride: 110 mmol/L (ref 98–111)
Creatinine, Ser: 0.67 mg/dL (ref 0.61–1.24)
GFR, Estimated: >60 mL/min (ref >60)
Glucose, Bld: 93 mg/dL (ref 70–99)
Potassium: 3.6 mmol/L (ref 3.5–5.1)
Sodium: 138 mmol/L (ref 135–145)
Total Bilirubin: 1.4 mg/dL — ABNORMAL HIGH (ref 0.0–1.2)
Total Protein: 6 g/dL — ABNORMAL LOW (ref 6.5–8.1)

## 2023-06-03 MED ORDER — LOPERAMIDE HCL 2 MG PO CAPS
2.0000 mg | ORAL_CAPSULE | Freq: Four times a day (QID) | ORAL | Status: DC | PRN
  Administered 2023-06-03: 2 mg via ORAL
  Filled 2023-06-03: qty 1

## 2023-06-03 NOTE — Plan of Care (Signed)
  Problem: Nutrition: Goal: Adequate nutrition will be maintained Outcome: Progressing   Problem: Education: Goal: Knowledge of General Education information will improve Description: Including pain rating scale, medication(s)/side effects and non-pharmacologic comfort measures Outcome: Not Progressing   Problem: Health Behavior/Discharge Planning: Goal: Ability to manage health-related needs will improve Outcome: Not Progressing   Problem: Activity: Goal: Risk for activity intolerance will decrease Outcome: Not Progressing   

## 2023-06-03 NOTE — Progress Notes (Signed)
 Subjective: Reports he feels ok. Ready to eat lunch. No abdominal pain, nausea, vomiting, brbpr, or melena. Confirmed no overt GI bleeding with nursing staff.   Objective: Vital signs in last 24 hours: Temp:  [97.8 F (36.6 C)-98 F (36.7 C)] 98 F (36.7 C) (03/11 0344) Pulse Rate:  [82-90] 84 (03/11 0344) Resp:  [22-25] 22 (03/10 2346) BP: (120-141)/(57-65) 121/65 (03/10 2346) SpO2:  [92 %-100 %] 93 % (03/11 0844) Last BM Date : 06/01/23 General:   Alert and oriented, pleasant, NAD.  Head:  Normocephalic and atraumatic. Eyes:  No icterus, sclera clear. Conjuctiva pink.  Heart:  S1, S2 present, no murmurs noted.  Lungs: Clear to auscultation bilaterally, without wheezing, rales, or rhonchi.  Abdomen:  Bowel sounds present, soft, non-tender, non-distended. No HSM or hernias noted. No rebound or guarding. No masses appreciated .  Msk:  Symmetrical without gross deformities. Normal posture. Pulses:  Normal pulses noted. Extremities:  Without edema. Psych:  Normal mood and affect.  Intake/Output from previous day: 03/10 0701 - 03/11 0700 In: 120 [P.O.:120] Out: 2700 [Urine:2700] Intake/Output this shift: No intake/output data recorded.  Lab Results: Recent Labs    06/01/23 0403 06/02/23 0440 06/03/23 0413  WBC 28.1* 26.1* 23.0*  HGB 7.5* 8.1* 8.7*  HCT 24.4* 27.2* 28.8*  PLT 416* 446* 458*   BMET Recent Labs    06/01/23 0403 06/02/23 0440 06/03/23 0413  NA 138 140 138  K 3.4* 4.1 3.6  CL 114* 112* 110  CO2 20* 21* 22  GLUCOSE 141* 121* 93  BUN 15 17 13   CREATININE 0.57* 0.68 0.67  CALCIUM 7.2* 8.3* 8.1*   LFT Recent Labs    06/01/23 0403 06/02/23 0440 06/02/23 1352 06/03/23 0413  PROT 5.9* 6.4*  --  6.0*  ALBUMIN 1.6* 1.9*  --  1.8*  AST 22 23  --  31  ALT 32 32  --  45*  ALKPHOS 105 110  --  117  BILITOT 2.7* 1.8*  --  1.4*  BILIDIR  --   --  0.8*  --    PT/INR Recent Labs    05/31/23 1458  LABPROT 21.3*  INR 1.8*      Assessment: 83 y.o. 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 GI now consulted due to acute on chronic anemia. Heme negative in ED.   Acute on chronic anemia: Hemoglobin 6.1 on admission, down from 10.8, 3 weeks prior.  Patient has had no overt GI bleeding and was heme-negative in the ED. however, I do note that iron and iron saturation have been low.  Ferritin was normal in January.  No B12 or folate deficiency.  He received 2 units PRBCs and hemoglobin improved to 7.5 initially, up to 8.7 today.  Last dose of Eliquis was this morning.  Now placed on hold.  We have recommended EGD and colonoscopy once improved from acute illness.   From sepsis standpoint, he has improved. Respiratory status essentially at baseline. On room air. Continues to have some coughing. Remains on IV antibiotics for UTI. WBC count is improving. Vitals without significant abnormality. Should be able to due procedures Thursday to allow for colon prep tomorrow and Eliquis wash out.    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  Primarily  indirect.  No biliary abnormalities on CT this admission.  Bilirubin is improving, 1.8 this morning, down from max of 3.4.     Plan: Reassess tomorrow for candidacy for EGD and colonoscopy possibly Thursday.  Clear liquid diet starting at midnight in anticipation for colon prep tomorrow.  Continue to hold Eliquis.  Continue pantoprazole 40 mg daily. Continue to monitor H&H and for overt GI bleeding.   LOS: 3 days    06/03/2023, 11:29 AM   Ermalinda Memos, PA-C [facility]

## 2023-06-03 NOTE — Progress Notes (Signed)
 Palliative: Face-to-face discussion with transition of care team and bedside nursing staff related to patient condition, needs.  Randy Russo is lying quietly in bed.  He appears chronically ill and somewhat frail.  He is alert, able to tell me his name and where we are.  He initially shares that we are in the evening but revises this to [place].  I do not ask further orientation questions.  Randy Russo is difficult to understand at times because of pressured speech and edentulous.  He seems fixated that he is being kept here against his will.  I reassured him that we are caring for him and we will do what we can to meet his needs.  Reassurance provided.  Randy Russo granddaughter, Randy Russo, is very involved in his care.  She is requesting that we optimize his treatment here and transfer him back to Randy Russo under long-term care with "treat the treatable" hospice care.  No call made to Randy Russo today, as there have been no fundamental changes in [name]'s condition or plan..  Conference with attending, bedside nursing staff, transition of care team related to patient condition, needs, goals of care, disposition.  Plan: Continue to treat the treatable but no CPR or intubation.  Return to Randy Russo under long-term care with the benefits of "treat the treatable" hospice services through Randy Russo.          35 minutes Randy Carmel, NP Palliative medicine team Team phone [phone]

## 2023-06-03 NOTE — Progress Notes (Signed)
 PROGRESS NOTE    Randy Russo  MVH:[id number] DOB: 12-23-1940 DOA: 05/31/2023 PCP: Assunta Found, MD    Brief Narrative:  83 year old with history of chronic anemia, GI bleeding, hypertension, BPH, diverticulosis, history of prostate cancer, COPD hypertension and portal vein thrombosis on anticoagulation brought from long-term nursing home with complaints of "sepsis labs being abnormal".  Patient was noted to be encephalopathic on presentation.  UA with possible infection.  WBC count was 26,000.  RSV was positive.  UA was abnormal.  Hemoccult was negative.  Hemoglobin was 6.1. Admitted with sepsis, UTI and anemia. Remains in the hospital.  White count elevated, no positive cultures yet.  Subjective:  Patient seen and examined.  No overnight events.  Today he is more conversant.  On room air.  Afebrile overnight.  WBC 23,000.  Assessment & Plan:   Sepsis present on admission secondary to UTI: Patient presented encephalopathic, tachypneic and tachycardic.  WBC count 24,000.  Lactic acid was 1.5.  Likely due to combination of UTI and RSV. Blood cultures and urine cultures are negative so far. Day 3 on vancomycin and Zosyn. MRSA nasal swab was positive.   Urine cultures and blood cultures negative.   Will continue Zosyn today treating for UTI.  Discontinue vancomycin and monitor.   CT scan abdomen pelvis without evidence of hydronephrosis or complicated renal system. WBC count is trending down.  Steroids discontinued.  COPD with RSV infection: Symptomatic management.  Breathing treatments.  Discontinue steroids.  Bronchodilators, mucolytic's.  Acute on chronic anemia of chronic disease: Presented with hemoglobin of 6.1.  Received 2 units of PRBC transfusion.  Hemoccult was negative.  Remains stable since then.  Will continue to monitor.  Patient is also on Eliquis. Consulted by GI.  Anticipating EGD/colonoscopy either in the hospital or as outpatient. Hemoglobin 8.7 today.  Pressure  injury: Stage II pressure injury present on admission.  Wound care.  Portal vein thrombosis: Therapeutic on Eliquis.  Tolerating. If GI deciding for endoscopies, will need to hold Eliquis.  Essential hypertension: Blood pressures stable.  Holding antihypertensives.         DVT prophylaxis:    Code Status: DNR with limited intervention Family Communication: grand daughter called and notified. Disposition Plan: Status is: Inpatient Remains inpatient appropriate because: Treated with IV antibiotics     Consultants:  None  Procedures:  None  Antimicrobials:  Vancomycin Flagyl and Zosyn 3/8--3/11 Zosyn 3/11---     Objective: Vitals:   06/02/23 2100 06/02/23 2346 06/03/23 0344 06/03/23 0844  BP: (!) 141/63 121/65    Pulse: 90 87 84   Resp: (!) 25 (!) 22    Temp:  97.8 F (36.6 C) 98 F (36.7 C)   TempSrc:  Oral Axillary   SpO2: 100% 95% 92% 93%  Weight:      Height:        Intake/Output Summary (Last 24 hours) at 06/03/2023 1012 Last data filed at 06/03/2023 0345 Gross per 24 hour  Intake --  Output 2700 ml  Net -2700 ml   Filed Weights   05/31/23 1356 05/31/23 2226  Weight: 85.7 kg 86.2 kg    Examination:  General exam: Appears calm , flat affect.  Mostly oriented. Respiratory system: Clear to auscultation.  Dry cough on deep breathing. Cardiovascular system: S1 & S2 heard, RRR. No pedal edema. Gastrointestinal system: Soft.  Nontender.  He has a large midline incision with healthy scars.  Bowel sound present. Central nervous system: Alert and awake.  Mostly oriented.  Occasionally drifts off conversation. Extremities: Symmetric 5 x 5 power.  Gross generalized weakness.    Data Reviewed: I have personally reviewed following labs and imaging studies  CBC: Recent Labs  Lab 05/31/23 1458 06/01/23 0403 06/02/23 0440 06/03/23 0413  WBC 24.8* 28.1* 26.1* 23.0*  NEUTROABS 21.8*  --  24.0* 18.4*  HGB 6.1* 7.5* 8.1* 8.7*  HCT 20.2* 24.4* 27.2*  28.8*  MCV 94.0 92.4 94.1 94.4  PLT 478* 416* 446* 458*   Basic Metabolic Panel: Recent Labs  Lab 05/31/23 1458 06/01/23 0403 06/02/23 0440 06/03/23 0413  NA 136 138 140 138  K 3.2* 3.4* 4.1 3.6  CL 103 114* 112* 110  CO2 23 20* 21* 22  GLUCOSE 115* 141* 121* 93  BUN 21 15 17 13   CREATININE 0.78 0.57* 0.68 0.67  CALCIUM 8.1* 7.2* 8.3* 8.1*  MG  --  1.9  --   --   PHOS  --  3.6  --   --    GFR: Estimated Creatinine Clearance: 73.3 mL/min (by C-G formula based on SCr of 0.67 mg/dL). Liver Function Tests: Recent Labs  Lab 05/31/23 1458 06/01/23 0403 06/02/23 0440 06/03/23 0413  AST 37 22 23 31   ALT 36 32 32 45*  ALKPHOS 120 105 110 117  BILITOT 3.4* 2.7* 1.8* 1.4*  PROT 6.4* 5.9* 6.4* 6.0*  ALBUMIN 1.7* 1.6* 1.9* 1.8*   No results for input(s): "LIPASE", "AMYLASE" in the last 168 hours. Recent Labs  Lab 05/31/23 1858  AMMONIA 21   Coagulation Profile: Recent Labs  Lab 05/31/23 1458  INR 1.8*   Cardiac Enzymes: No results for input(s): "CKTOTAL", "CKMB", "CKMBINDEX", "TROPONINI" in the last 168 hours. BNP (last 3 results) No results for input(s): "PROBNP" in the last 8760 hours. HbA1C: No results for input(s): "HGBA1C" in the last 72 hours. CBG: Recent Labs  Lab 05/31/23 1820  GLUCAP 133*   Lipid Profile: No results for input(s): "CHOL", "HDL", "LDLCALC", "TRIG", "CHOLHDL", "LDLDIRECT" in the last 72 hours. Thyroid Function Tests: No results for input(s): "TSH", "T4TOTAL", "FREET4", "T3FREE", "THYROIDAB" in the last 72 hours. Anemia Panel: Recent Labs    06/01/23 0748 06/01/23 1536  VITAMINB12  --  1,055*  FOLATE  --  10.4  TIBC 115*  --   IRON 24*  --    Sepsis Labs: Recent Labs  Lab 05/31/23 1458 05/31/23 1648 06/01/23 0401  PROCALCITON  --   --  3.17  LATICACIDVEN 1.5 1.5  --     Recent Results (from the past 240 hours)  Blood Culture (routine x 2)     Status: None (Preliminary result)   Collection Time: 05/31/23  2:58 PM    Specimen: Right Antecubital; Blood  Result Value Ref Range Status   Specimen Description   Final    RIGHT ANTECUBITAL BOTTLES DRAWN AEROBIC AND ANAEROBIC   Special Requests Blood Culture adequate volume  Final   Culture   Final    NO GROWTH 3 DAYS Performed at [facility], [address] [place], [place] [address]    Report Status PENDING  Incomplete  Blood Culture (routine x 2)     Status: None (Preliminary result)   Collection Time: 05/31/23  2:58 PM   Specimen: BLOOD RIGHT HAND  Result Value Ref Range Status   Specimen Description   Final    BLOOD RIGHT HAND BOTTLES DRAWN AEROBIC AND ANAEROBIC   Special Requests Blood Culture adequate volume  Final   Culture   Final  NO GROWTH 3 DAYS Performed at [facility], [address], [place], [place] [address]    Report Status PENDING  Incomplete  Urine Culture     Status: None   Collection Time: 05/31/23  4:18 PM   Specimen: Urine, Random  Result Value Ref Range Status   Specimen Description   Final    URINE, RANDOM Performed at [facility], [address] [place], [place] [address]    Special Requests   Final    NONE Reflexed from [id number] Performed at [facility], [address] [place], [place] [address]    Culture   Final    NO GROWTH Performed at [facility], [address] [place], [place] [address]    Report Status 06/02/2023 FINAL  Final  Resp panel by RT-PCR (RSV, Flu A&B, Covid) Anterior Nasal Swab     Status: Abnormal   Collection Time: 05/31/23  8:07 PM   Specimen: Anterior Nasal Swab  Result Value Ref Range Status   SARS Coronavirus 2 by RT PCR NEGATIVE NEGATIVE Final    Comment: (NOTE) SARS-CoV-2 target nucleic acids are NOT DETECTED.  The SARS-CoV-2 RNA is generally detectable in upper respiratory specimens during the acute phase of infection. The lowest concentration of SARS-CoV-2 viral copies this assay can detect is 138 copies/mL. A negative result does not preclude SARS-Cov-2 infection and  should not be used as the sole basis for treatment or other patient management decisions. A negative result may occur with  improper specimen collection/handling, submission of specimen other than nasopharyngeal swab, presence of viral mutation(s) within the areas targeted by this assay, and inadequate number of viral copies(<138 copies/mL). A negative result must be combined with clinical observations, patient history, and epidemiological information. The expected result is Negative.  Fact Sheet for Patients:  BloggerCourse.com  Fact Sheet for Healthcare Providers:  SeriousBroker.it  This test is no t yet approved or cleared by the [place] FDA and  has been authorized for detection and/or diagnosis of SARS-CoV-2 by FDA under an Emergency Use Authorization (EUA). This EUA will remain  in effect (meaning this test can be used) for the duration of the COVID-19 declaration under Section 564(b)(1) of the Act, 21 U.S.C.section 360bbb-3(b)(1), unless the authorization is terminated  or revoked sooner.       Influenza A by PCR NEGATIVE NEGATIVE Final   Influenza B by PCR NEGATIVE NEGATIVE Final    Comment: (NOTE) The Xpert Xpress SARS-CoV-2/FLU/RSV plus assay is intended as an aid in the diagnosis of influenza from Nasopharyngeal swab specimens and should not be used as a sole basis for treatment. Nasal washings and aspirates are unacceptable for Xpert Xpress SARS-CoV-2/FLU/RSV testing.  Fact Sheet for Patients: BloggerCourse.com  Fact Sheet for Healthcare Providers: SeriousBroker.it  This test is not yet approved or cleared by the [place] FDA and has been authorized for detection and/or diagnosis of SARS-CoV-2 by FDA under an Emergency Use Authorization (EUA). This EUA will remain in effect (meaning this test can be used) for the duration of the COVID-19 declaration  under Section 564(b)(1) of the Act, 21 U.S.C. section 360bbb-3(b)(1), unless the authorization is terminated or revoked.     Resp Syncytial Virus by PCR POSITIVE (A) NEGATIVE Final    Comment: (NOTE) Fact Sheet for Patients: BloggerCourse.com  Fact Sheet for Healthcare Providers: SeriousBroker.it  This test is not yet approved or cleared by the [place] FDA and has been authorized for detection and/or diagnosis of SARS-CoV-2 by FDA under  an Emergency Use Authorization (EUA). This EUA will remain in effect (meaning this test can be used) for the duration of the COVID-19 declaration under Section 564(b)(1) of the Act, 21 U.S.C. section 360bbb-3(b)(1), unless the authorization is terminated or revoked.  Performed at [facility], [address], [place], [place] [address]   MRSA Next Gen by PCR, Nasal     Status: Abnormal   Collection Time: 05/31/23  9:11 PM   Specimen: Nasal Mucosa; Nasal Swab  Result Value Ref Range Status   MRSA by PCR Next Gen DETECTED (A) NOT DETECTED Final    Comment: RESULT CALLED TO, READ BACK BY AND VERIFIED WITH: ESTOCE,R ON 06/02/23 AT 0037 BY PURDIE,J        The GeneXpert MRSA Assay (FDA approved for NASAL specimens only), is one component of a comprehensive MRSA colonization surveillance program. It is not intended to diagnose MRSA infection nor to guide or monitor treatment for MRSA infections. Performed at [facility], [address], [place], [place] [address]          Radiology Studies: No results found.       Scheduled Meds:  sodium chloride   Intravenous Once   sodium chloride   Intravenous Once   ascorbic acid  500 mg Oral Daily   Chlorhexidine Gluconate Cloth  6 each Topical Daily   docusate sodium  200 mg Oral Daily   feeding supplement  237 mL Oral BID BM   guaiFENesin-dextromethorphan  10 mL Oral Q8H   latanoprost  1 drop Both Eyes QHS   mupirocin ointment  1  Application Nasal BID   pantoprazole (PROTONIX) IV  40 mg Intravenous Q24H   umeclidinium bromide  1 puff Inhalation Daily   Continuous Infusions:  piperacillin-tazobactam (ZOSYN)  IV 3.375 g (06/03/23 0517)     LOS: 3 days    Time spent: 40 minutes    Dorcas Carrow, MD [facility]

## 2023-06-03 NOTE — Care Management Important Message (Signed)
 Important Message  Patient Details  Name: ZAELYN BARBARY MRN: [id number] Date of Birth: 1940/09/14   Important Message Given:  Yes - Medicare IM (spoke with granddaughter Lum Babe at [phone] to review letter)     Corey Harold 06/03/2023, 4:44 PM

## 2023-06-04 DIAGNOSIS — J449 Chronic obstructive pulmonary disease, unspecified: Secondary | ICD-10-CM

## 2023-06-04 DIAGNOSIS — Z515 Encounter for palliative care: Secondary | ICD-10-CM | POA: Diagnosis not present

## 2023-06-04 DIAGNOSIS — B338 Other specified viral diseases: Secondary | ICD-10-CM | POA: Diagnosis not present

## 2023-06-04 DIAGNOSIS — D649 Anemia, unspecified: Secondary | ICD-10-CM | POA: Diagnosis not present

## 2023-06-04 DIAGNOSIS — A419 Sepsis, unspecified organism: Secondary | ICD-10-CM | POA: Diagnosis not present

## 2023-06-04 DIAGNOSIS — Z7189 Other specified counseling: Secondary | ICD-10-CM | POA: Diagnosis not present

## 2023-06-04 DIAGNOSIS — I10 Essential (primary) hypertension: Secondary | ICD-10-CM

## 2023-06-04 DIAGNOSIS — I81 Portal vein thrombosis: Secondary | ICD-10-CM | POA: Diagnosis not present

## 2023-06-04 DIAGNOSIS — Z66 Do not resuscitate: Secondary | ICD-10-CM

## 2023-06-04 DIAGNOSIS — Z7901 Long term (current) use of anticoagulants: Secondary | ICD-10-CM

## 2023-06-04 LAB — CBC WITH DIFFERENTIAL/PLATELET
Abs Immature Granulocytes: 0.5 10*3/uL — ABNORMAL HIGH (ref 0.00–0.07)
Band Neutrophils: 3 %
Basophils Absolute: 0 10*3/uL (ref 0.0–0.1)
Basophils Relative: 0 %
Eosinophils Absolute: 0 10*3/uL (ref 0.0–0.5)
Eosinophils Relative: 0 %
HCT: 29.5 % — ABNORMAL LOW (ref 39.0–52.0)
Hemoglobin: 8.6 g/dL — ABNORMAL LOW (ref 13.0–17.0)
Lymphocytes Relative: 11 %
Lymphs Abs: 1.5 10*3/uL (ref 0.7–4.0)
MCH: 28 pg (ref 26.0–34.0)
MCHC: 29.2 g/dL — ABNORMAL LOW (ref 30.0–36.0)
MCV: 96.1 fL (ref 80.0–100.0)
Metamyelocytes Relative: 2 %
Monocytes Absolute: 0.7 10*3/uL (ref 0.1–1.0)
Monocytes Relative: 5 %
Myelocytes: 2 %
Neutro Abs: 10.8 10*3/uL — ABNORMAL HIGH (ref 1.7–7.7)
Neutrophils Relative %: 77 %
Platelets: 381 10*3/uL (ref 150–400)
RBC: 3.07 MIL/uL — ABNORMAL LOW (ref 4.22–5.81)
RDW: 25.3 % — ABNORMAL HIGH (ref 11.5–15.5)
Smear Review: ADEQUATE
WBC: 13.5 10*3/uL — ABNORMAL HIGH (ref 4.0–10.5)
nRBC: 1.7 % — ABNORMAL HIGH (ref 0.0–0.2)
nRBC: 2 /100{WBCs} — ABNORMAL HIGH

## 2023-06-04 LAB — COMPREHENSIVE METABOLIC PANEL
ALT: 45 U/L — ABNORMAL HIGH (ref 0–44)
AST: 26 U/L (ref 15–41)
Albumin: 1.8 g/dL — ABNORMAL LOW (ref 3.5–5.0)
Alkaline Phosphatase: 113 U/L (ref 38–126)
Anion gap: 6 (ref 5–15)
BUN: 11 mg/dL (ref 8–23)
CO2: 21 mmol/L — ABNORMAL LOW (ref 22–32)
Calcium: 8 mg/dL — ABNORMAL LOW (ref 8.9–10.3)
Chloride: 111 mmol/L (ref 98–111)
Creatinine, Ser: 0.68 mg/dL (ref 0.61–1.24)
GFR, Estimated: >60 mL/min (ref >60)
Glucose, Bld: 104 mg/dL — ABNORMAL HIGH (ref 70–99)
Potassium: 3.8 mmol/L (ref 3.5–5.1)
Sodium: 138 mmol/L (ref 135–145)
Total Bilirubin: 1.2 mg/dL (ref 0.0–1.2)
Total Protein: 5.7 g/dL — ABNORMAL LOW (ref 6.5–8.1)

## 2023-06-04 MED ORDER — PEG 3350-KCL-NA BICARB-NACL 420 G PO SOLR
4000.0000 mL | Freq: Once | ORAL | Status: AC
  Administered 2023-06-04: 4000 mL via ORAL
  Filled 2023-06-04: qty 4000

## 2023-06-04 NOTE — Plan of Care (Signed)
   Problem: Education: Goal: Knowledge of General Education information will improve Description: Including pain rating scale, medication(s)/side effects and non-pharmacologic comfort measures Outcome: Progressing   Problem: Health Behavior/Discharge Planning: Goal: Ability to manage health-related needs will improve Outcome: Progressing   Problem: Nutrition: Goal: Adequate nutrition will be maintained Outcome: Progressing

## 2023-06-04 NOTE — Progress Notes (Signed)
 Subjective: Patient states he is feeling much better today. Denies any GI complaints. Had a BM this morning that was normal for him. He is amenable to proceeding with Colonoscopy and EGD tomorrow. Feels he is breathing much easier.   Objective: Vital signs in last 24 hours: Temp:  [98 F (36.7 C)-98.5 F (36.9 C)] 98 F (36.7 C) (03/12 0314) Pulse Rate:  [79-86] 86 (03/12 0314) BP: (99-108)/(62-93) 99/62 (03/12 0314) SpO2:  [98 %-100 %] 98 % (03/12 0847) Last BM Date : 06/01/23 General:   Alert and oriented, pleasant Head:  Normocephalic and atraumatic. Eyes:  No icterus, sclera clear. Conjuctiva pink.  Mouth:  Without lesions, mucosa pink and moist. .  Heart:  S1, S2 present, no murmurs noted.  Lungs: Clear to auscultation bilaterally, without wheezing, rales, or rhonchi.  Abdomen:  Bowel sounds present, soft, non-tender, non-distended. No HSM or hernias noted. No rebound or guarding. No masses appreciated  Neurologic:  Alert and  oriented x4;  grossly normal neurologically. Skin:  Warm and dry, intact without significant lesions.  Psych:  Alert and cooperative. Normal mood and affect.  Intake/Output from previous day: 03/11 0701 - 03/12 0700 In: -  Out: 1350 [Urine:1350] Intake/Output this shift: No intake/output data recorded.  Lab Results: Recent Labs    06/02/23 0440 06/03/23 0413 06/04/23 0413  WBC 26.1* 23.0* 13.5*  HGB 8.1* 8.7* 8.6*  HCT 27.2* 28.8* 29.5*  PLT 446* 458* 381   BMET Recent Labs    06/02/23 0440 06/03/23 0413 06/04/23 0413  NA 140 138 138  K 4.1 3.6 3.8  CL 112* 110 111  CO2 21* 22 21*  GLUCOSE 121* 93 104*  BUN 17 13 11   CREATININE 0.68 0.67 0.68  CALCIUM 8.3* 8.1* 8.0*   LFT Recent Labs    06/02/23 0440 06/02/23 1352 06/03/23 0413 06/04/23 0413  PROT 6.4*  --  6.0* 5.7*  ALBUMIN 1.9*  --  1.8* 1.8*  AST 23  --  31 26  ALT 32  --  45* 45*  ALKPHOS 110  --  117 113  BILITOT 1.8*  --  1.4* 1.2  BILIDIR  --  0.8*  --   --      Assessment: Randy Russo is an 83 year old male with 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  GI consulted for anemia, heme negative stool in the ED.  Acute on chronic anemia: No overt GI bleeding.  FOBT negative.  Status post 2 units packed red blood cells with hemoglobin improved, 8.6 this morning.  Iron studies with low iron and saturation.  Ferritin normal in January.  Last dose of Eliquis was Tuesday morning and now on hold.  Patient recommended to have EGD and colonoscopy once clinically improved.  He is feeling much better today. Feels breathing is much improved. No supplemental O2, oxygen sat 98%. WBC down from 23 to 13.5.  Notably, patient and family have decided to transition to hospice care with plans to "treat the treatable." Indications, risks and benefits of procedure discussed in detail with patient. Patient verbalized understanding and is in agreement to proceed with EGD and Colonoscopy tomorrow which I think would be reasonable given his clinical improvement. I connected with patient's grand daugther Randy Russo via telephone as well to discuss plans for EGD  and Colonoscopy tomorrow and she is also still in agreement for patient to undergo endoscopic evaluations as initially planned.   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  Primarily indirect.  Noted biliary abnormalities on CT this admission.  T. bili max 3.4, normal at 1.2 this morning   Plan: Trend H&H, monitor for overt GI bleeding Continue to hold Eliquis Plan for EGD and colonoscopy on Thursday, prep today  PPI daily Clear liquids today, n.p.o. midnight   LOS: 4 days    06/04/2023, 9:24 AM   Randy Russo L. Jeanmarie Hubert, MSN, APRN, AGNP-C Adult-Gerontology Nurse Practitioner [facility]  Gastroenterology at [facility]

## 2023-06-04 NOTE — Progress Notes (Signed)
 Palliative: Face-to-face discussion with transition of care team and bedside nursing staff related to patient condition, needs, disposition.  Mr. Waterworth is resting quietly in bed.  He appears chronically ill.  He does not respond to gentle voice or touch.  He has had periods of confusion and agitation therefore I let him rest.  Call to granddaughter/responsible party, Lum Babe.  We talk about Mr. Reddish's acute health concerns.  We talk about his respiratory status which has improved and he is now on room air.  We talked about his UTI which is complicated in the end and likely to recur, Rinaldo Cloud states that she expects this.  We talk about resolved portal vein thrombosis no longer needing Eliquis at discharge.  We also talk about GI plan for scoping tomorrow and trending H&H.  Overall, family is knowledgeable about her grandfather acute and chronic health concerns.  All questions answered.  PMT to shadow.  Conference with attending, bedside nursing staff, transition of care team related to patient condition, needs, goals of care, disposition.  Plan: At this point continue to treat the treatable but no CPR or intubation.  Return to [facility] under long-term care, skilled level if possible initially.  "Treat the treatable" hospice services through [place].  50 minutes  Lillia Carmel, NP Palliative medicine team Team phone [phone]

## 2023-06-04 NOTE — Assessment & Plan Note (Signed)
 Made DNR/DNI on admission.

## 2023-06-04 NOTE — H&P (View-Only) (Signed)
 Subjective: Patient states he is feeling much better today. Denies any GI complaints. Had a BM this morning that was normal for him. He is amenable to proceeding with Colonoscopy and EGD tomorrow. Feels he is breathing much easier.   Objective: Vital signs in last 24 hours: Temp:  [98 F (36.7 C)-98.5 F (36.9 C)] 98 F (36.7 C) (03/12 0314) Pulse Rate:  [79-86] 86 (03/12 0314) BP: (99-108)/(62-93) 99/62 (03/12 0314) SpO2:  [98 %-100 %] 98 % (03/12 0847) Last BM Date : 06/01/23 General:   Alert and oriented, pleasant Head:  Normocephalic and atraumatic. Eyes:  No icterus, sclera clear. Conjuctiva pink.  Mouth:  Without lesions, mucosa pink and moist. .  Heart:  S1, S2 present, no murmurs noted.  Lungs: Clear to auscultation bilaterally, without wheezing, rales, or rhonchi.  Abdomen:  Bowel sounds present, soft, non-tender, non-distended. No HSM or hernias noted. No rebound or guarding. No masses appreciated  Neurologic:  Alert and  oriented x4;  grossly normal neurologically. Skin:  Warm and dry, intact without significant lesions.  Psych:  Alert and cooperative. Normal mood and affect.  Intake/Output from previous day: 03/11 0701 - 03/12 0700 In: -  Out: 1350 [Urine:1350] Intake/Output this shift: No intake/output data recorded.  Lab Results: Recent Labs    06/02/23 0440 06/03/23 0413 06/04/23 0413  WBC 26.1* 23.0* 13.5*  HGB 8.1* 8.7* 8.6*  HCT 27.2* 28.8* 29.5*  PLT 446* 458* 381   BMET Recent Labs    06/02/23 0440 06/03/23 0413 06/04/23 0413  NA 140 138 138  K 4.1 3.6 3.8  CL 112* 110 111  CO2 21* 22 21*  GLUCOSE 121* 93 104*  BUN 17 13 11   CREATININE 0.68 0.67 0.68  CALCIUM 8.3* 8.1* 8.0*   LFT Recent Labs    06/02/23 0440 06/02/23 1352 06/03/23 0413 06/04/23 0413  PROT 6.4*  --  6.0* 5.7*  ALBUMIN 1.9*  --  1.8* 1.8*  AST 23  --  31 26  ALT 32  --  45* 45*  ALKPHOS 110  --  117 113  BILITOT 1.8*  --  1.4* 1.2  BILIDIR  --  0.8*  --   --      Assessment: Randy Russo is an 83 year old male with 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  GI consulted for anemia, heme negative stool in the ED.  Acute on chronic anemia: No overt GI bleeding.  FOBT negative.  Status post 2 units packed red blood cells with hemoglobin improved, 8.6 this morning.  Iron studies with low iron and saturation.  Ferritin normal in January.  Last dose of Eliquis was Tuesday morning and now on hold.  Patient recommended to have EGD and colonoscopy once clinically improved.  He is feeling much better today. Feels breathing is much improved. No supplemental O2, oxygen sat 98%. WBC down from 23 to 13.5.  Notably, patient and family have decided to transition to hospice care with plans to "treat the treatable." Indications, risks and benefits of procedure discussed in detail with patient. Patient verbalized understanding and is in agreement to proceed with EGD and Colonoscopy tomorrow which I think would be reasonable given his clinical improvement. I connected with patient's grand daugther Randy Russo via telephone as well to discuss plans for EGD  and Colonoscopy tomorrow and she is also still in agreement for patient to undergo endoscopic evaluations as initially planned.   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  Primarily indirect.  Noted biliary abnormalities on CT this admission.  T. bili max 3.4, normal at 1.2 this morning   Plan: Trend H&H, monitor for overt GI bleeding Continue to hold Eliquis Plan for EGD and colonoscopy on Thursday, prep today  PPI daily Clear liquids today, n.p.o. midnight   LOS: 4 days    06/04/2023, 9:24 AM   Randy Russo L. Jeanmarie Hubert, MSN, APRN, AGNP-C Adult-Gerontology Nurse Practitioner [facility]  Gastroenterology at [facility]

## 2023-06-04 NOTE — Subjective & Objective (Signed)
 Pt seen and examined. No family at bedside. EGD/Colonoscopy canceled yesterday by GI and anesthesia. They did not feel the benefits of procedure outweighed risks. They felt that risks were too great given stable HgB and no evidence of bleeding.

## 2023-06-04 NOTE — TOC Progression Note (Signed)
 Transition of Care [facility]) - Progression Note    Patient Details  Name: Randy Russo MRN: [id number] Date of Birth: 21-Sep-1940  Transition of Care [facility]) CM/SW Contact  Isabella Bowens, [place] Phone Number: 06/04/2023, 11:51 AM  Clinical Narrative:    CSW spoke with [facility] with [facility] and Palliative about pt referral that was sent. Rae Halsted shared that she was going to take care of it. CSW then called Eunice Blase and asked if pt could return without Auth and Eunice Blase stated to wait until it was officially denied since pt is medicaid pending and once it is denied they will take him back. TOC will continue to follow.    Expected Discharge Plan: Skilled Nursing Facility Barriers to Discharge: Insurance Authorization  Expected Discharge Plan and Services   Living arrangements for the past 2 months: Skilled Nursing Facility       Social Determinants of Health (SDOH) Interventions SDOH Screenings   Food Insecurity: No Food Insecurity (05/31/2023)  Recent Concern: Food Insecurity - Food Insecurity Present (04/22/2023)  Housing: Low Risk  (05/31/2023)  Recent Concern: Housing - High Risk (04/13/2023)  Transportation Needs: No Transportation Needs (05/31/2023)  Utilities: Not At Risk (05/31/2023)  Financial Resource Strain: Low Risk  (11/30/2022)   Received from [facility]  Physical Activity: Unknown (12/27/2017)  Social Connections: Patient Unable To Answer (05/31/2023)  Tobacco Use: Medium Risk (06/02/2023)  Health Literacy: Medium Risk (11/30/2022)   Received from [facility]    Readmission Risk Interventions    06/04/2023   11:51 AM 06/02/2023    2:28 PM 06/01/2023    4:32 PM  Readmission Risk Prevention Plan  Transportation Screening Complete Complete Complete  PCP or Specialist Appt within 3-5 Days   Complete  HRI or Home Care Consult Complete Complete Complete  Social Work Consult for Recovery Care Planning/Counseling Complete Complete Complete  Palliative Care Screening  Complete  Not Applicable  Medication Review Oceanographer) Complete Complete Complete

## 2023-06-04 NOTE — Progress Notes (Signed)
 OT Cancellation Note  Patient Details Name: ABYAN CADMAN MRN: [id number] DOB: [birth year]   Cancelled Treatment:    Reason Eval/Treat Not Completed: OT screened, no needs identified, will sign off. Per chart review, pt is going on hospice care and is not appropriate for OT services. Pt will be removed from the OT list. Thank you.   Jamilynn Whitacre OT, MOT   Danie Chandler 06/04/2023, 8:03 AM

## 2023-06-04 NOTE — Progress Notes (Signed)
 PROGRESS NOTE    Randy Russo  ZOX:[id number] DOB: April 13, 1940 DOA: 05/31/2023 PCP: Assunta Found, MD  Subjective: Pt seen and examined. Chronically ill appearing. No family at bedside. Wants to eat breakfast. On RA.   Hospital Course: HPI:  Randy Russo is a 83 y.o. 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  Patient unable to give history due to encephalopathy.  Patient is a resident of a skilled nursing facility he was brought here due to "his sepsis labs being abnormal".  Here, the patient was initially fairly lucid.  UA showed a bladder infection, he was started on Rocephin.  CBC showed anemia but his rectal exam was negative.  Shortly after his labs returned, he had an abrupt decline and became encephalopathic.  He also became tachycardic and tachypneic and quite agitated.  He was given ketamine with good effect.  CT of chest and CT of abdomen pelvis were done and were negative for acute infection.  Due to his anemia, a blood transfusion was started.   Significant Events: Admitted 05/31/2023 for anemia   Significant Labs: Lactic acid 1.5 Na 136, K 3.2, CO2 of 23, BUN 21, Scr 0.78, glu 115 WBC 24.8, HgB 6.1, plt 478 INR 1.8 Hemoccult Negative Procalcitonin 3.17 BNP 118 Fe 24, TIBC 115, %sat 21 B12 1055 RBC folate 10.4  Significant Imaging Studies: CXR No active disease  CTPA/CT abd/pelvis Examination pulmonary embolism significantly limited by breath  motion artifact. Within this limitation, no central or proximal segmental pulmonary embolus. 2. No acute CT findings of the chest,  abdomen, or pelvis to explain sepsis within significant limitations of breath motion, patient motion, and streak artifact. 3. Descending and sigmoid diverticulosis without evidence of acute diverticulitis. 4. Midline ventral hernia  containing nonobstructed mid small bowel. 5. Previously noted portal venous thrombus is not appreciated on today's examination and presumably resolved   Antibiotic Therapy: Anti-infectives (From admission, onward)    Start     Dose/Rate Route Frequency Ordered Stop   06/01/23 1830  vancomycin (VANCOREADY) IVPB 1750 mg/350 mL  Status:  Discontinued        1,750 mg 175 mL/hr over 120 Minutes Intravenous Every 24 hours 05/31/23 1839 06/03/23 1012   06/01/23 1800  cefTRIAXone (ROCEPHIN) 2 g in sodium chloride 0.9 % 100 mL IVPB  Status:  Discontinued        2 g 200 mL/hr over 30 Minutes Intravenous Every 24 hours 05/31/23 2226 06/01/23 0719   06/01/23 1400  piperacillin-tazobactam (ZOSYN) IVPB 3.375 g        3.375 g 12.5 mL/hr over 240 Minutes Intravenous Every 8 hours 06/01/23 1113     06/01/23 0830  piperacillin-tazobactam (ZOSYN) IVPB 3.375 g        3.375 g 100 mL/hr over 30 Minutes Intravenous  Once 06/01/23 0742 06/01/23 0834   05/31/23 1845  metroNIDAZOLE (FLAGYL) IVPB 500 mg  Status:  Discontinued        500 mg 100 mL/hr over 60 Minutes Intravenous 2 times daily 05/31/23 1830 06/01/23 0742   05/31/23 1845  vancomycin (VANCOREADY) IVPB 1750 mg/350 mL        1,750 mg 175 mL/hr over 120 Minutes Intravenous  Once 05/31/23 1834 05/31/23 2151   05/31/23 1600  cefTRIAXone (ROCEPHIN) 2 g in sodium chloride 0.9 % 100 mL IVPB        2 g 200  mL/hr over 30 Minutes Intravenous Once 05/31/23 1549 05/31/23 1647       Procedures:   Consultants: GI Palliative care    Assessment and Plan: * Sepsis secondary to UTI [facility]) On admission. Patient met sepsis criteria on admission-cephalopathic, tachypneic, tachycardic, WBC 24.8  >> 28.1, lactic acid 1.5, -Still meeting sepsis criteria, tachypneic, tachycardic, blood pressure soft. -Likely source urinary-UTI and RSV infection. -Will follow-up with blood and urine cultures. -Continue on broad-spectrum antibiotics: Zosyn, Flagyl, vancomycin for now  with a quick de-escalation 24 hours, based on cultures. -Sepsis protocol, s/p IV hydration -continue maintenance IV fluid with LR.  06-02-2023 to 06-03-2023 see Dr. Windell Norfolk documentation  06-04-2023  urine cx negative. Has had 72 hours of IV zosyn. Should be sufficient for culture negative UTI.  RSV infection On admission. - Continue supportive care. -Supplemental oxygen as needed. -Continue as needed Tylenol, mucolytics. -IV steroids with quick taper  06-02-2023 to 06-03-2023 see Dr. Windell Norfolk documentation   06-04-2023 on RA. Off steroids.  Acute on chronic anemia On admission. - Anemia of chronic disease, on Eliquis. - POA: Globin down from 10.8  >> 6.1. -Hemoccult negative. -Overnight 06/01/2023 was transfused with 2U PRBCs. -Trend H&H closely, transfuse as needed     Latest Ref Rng & Units 06/01/2023    4:03 AM 05/31/2023    2:58 PM 05/08/2023   10:12 AM  CBC  WBC 4.0 - 10.5 K/uL 28.1  24.8  5.8   Hemoglobin 13.0 - 17.0 g/dL 7.5  6.1  40.9   Hematocrit 39.0 - 52.0 % 24.4  20.2  35.5   Platelets 150 - 400 K/uL 416  478  365    06-02-2023 to 06-03-2023 see Dr. Windell Norfolk documentation   06-04-2023 GI consulted on 06-02-2023. GI trying to decide if they are going to proceed with EGD/colonoscopy. Eliquis has been held. Last dose 06-03-2023 @ 0916  Portal vein thrombosis On admission. - Continue Eliquis. -COVID-negative  06-02-2023 to 06-03-2023 see Dr. Windell Norfolk documentation   06-04-2023 off Eliquis for possible EGD/colonoscopy.  On admission, prior portal vein thrombosis has resolved. Can stop Eliquis at discharge.  Chronic anticoagulation 06-04-2023 portal vein thrombosis has resolved. Can stop eliquis at discharge.  Essential hypertension On admission. - Hypertensive, with holding home BP meds. Ending IV fluid hydration  06-02-2023 to 06-03-2023 see Dr. Windell Norfolk documentation   06-04-2023 stable. BP meds on hold due to borderline low BP.  COPD (chronic obstructive pulmonary  disease) (HCC) On admission. Upper respiratory symptoms likely due to RSV. -Wheezing, with rhonchi, -Currently on room air, satting 91%. -Continue O2 supplement via nasal cannula, maintaining O2 sat greater than 92%. -As needed DuoNeb bronchodilator treatment, as needed mucolytics  06-02-2023 to 06-03-2023 see Dr. Windell Norfolk documentation   06-04-2023 on RA. No wheezing. Continue inhalers and prn nebs.  DNR (do not resuscitate)/DNI(Do Not Intubate) Made DNR/DNI on admission.  Pressure injury of skin-resolved as of 06/04/2023 - Continue wound care per RN-consult placed -Rotating patient every 2 hours,   DVT prophylaxis: SCD    Code Status: Limited: Do not attempt resuscitation (DNR) -DNR-LIMITED -Do Not Intubate/DNI  Family Communication: no family at bedside Disposition Plan: SNF Reason for continuing need for hospitalization: GI deciding on whether to pursue inpatient EGD/colonoscopy.  Objective: Vitals:   06/03/23 1603 06/03/23 1921 06/04/23 0314 06/04/23 0847  BP: (!) 108/93 108/77 99/62   Pulse: 79 84 86   Resp:      Temp: 98.5 F (36.9 C) 98.2 F (36.8 C) 98 F (36.7 C)  TempSrc: Oral Oral Axillary   SpO2: 100% 100% 100% 98%  Weight:      Height:        Intake/Output Summary (Last 24 hours) at 06/04/2023 1136 Last data filed at 06/04/2023 0315 Gross per 24 hour  Intake --  Output 800 ml  Net -800 ml   Filed Weights   05/31/23 1356 05/31/23 2226  Weight: 85.7 kg 86.2 kg    Examination:  Physical Exam Vitals and nursing note reviewed.  Constitutional:      Comments: Chronically ill appearing  HENT:     Head: Normocephalic and atraumatic.  Eyes:     General: No scleral icterus. Cardiovascular:     Rate and Rhythm: Normal rate and regular rhythm.  Pulmonary:     Effort: Pulmonary effort is normal.     Breath sounds: Normal breath sounds.  Abdominal:     General: Abdomen is flat. Bowel sounds are normal. There is no distension.     Palpations: Abdomen is  soft.  Musculoskeletal:     Right lower leg: No edema.     Left lower leg: No edema.  Skin:    General: Skin is warm and dry.     Capillary Refill: Capillary refill takes less than 2 seconds.  Neurological:     Mental Status: He is disoriented.     Data Reviewed: I have personally reviewed following labs and imaging studies  CBC: Recent Labs  Lab 05/31/23 1458 06/01/23 0403 06/02/23 0440 06/03/23 0413 06/04/23 0413  WBC 24.8* 28.1* 26.1* 23.0* 13.5*  NEUTROABS 21.8*  --  24.0* 18.4* 10.8*  HGB 6.1* 7.5* 8.1* 8.7* 8.6*  HCT 20.2* 24.4* 27.2* 28.8* 29.5*  MCV 94.0 92.4 94.1 94.4 96.1  PLT 478* 416* 446* 458* 381   Basic Metabolic Panel: Recent Labs  Lab 05/31/23 1458 06/01/23 0403 06/02/23 0440 06/03/23 0413 06/04/23 0413  NA 136 138 140 138 138  K 3.2* 3.4* 4.1 3.6 3.8  CL 103 114* 112* 110 111  CO2 23 20* 21* 22 21*  GLUCOSE 115* 141* 121* 93 104*  BUN 21 15 17 13 11   CREATININE 0.78 0.57* 0.68 0.67 0.68  CALCIUM 8.1* 7.2* 8.3* 8.1* 8.0*  MG  --  1.9  --   --   --   PHOS  --  3.6  --   --   --    GFR: Estimated Creatinine Clearance: 73.3 mL/min (by C-G formula based on SCr of 0.68 mg/dL). Liver Function Tests: Recent Labs  Lab 05/31/23 1458 06/01/23 0403 06/02/23 0440 06/03/23 0413 06/04/23 0413  AST 37 22 23 31 26   ALT 36 32 32 45* 45*  ALKPHOS 120 105 110 117 113  BILITOT 3.4* 2.7* 1.8* 1.4* 1.2  PROT 6.4* 5.9* 6.4* 6.0* 5.7*  ALBUMIN 1.7* 1.6* 1.9* 1.8* 1.8*    Recent Labs  Lab 05/31/23 1858  AMMONIA 21   Coagulation Profile: Recent Labs  Lab 05/31/23 1458  INR 1.8*   BNP (last 3 results) Recent Labs    04/12/23 1822 06/01/23 0401  BNP 58.0 118.0*   CBG: Recent Labs  Lab 05/31/23 1820  GLUCAP 133*   Anemia Panel: Recent Labs    06/01/23 1536  VITAMINB12 1,055*  FOLATE 10.4   Sepsis Labs: Recent Labs  Lab 05/31/23 1458 05/31/23 1648 06/01/23 0401  PROCALCITON  --   --  3.17  LATICACIDVEN 1.5 1.5  --      Recent Results (from the past 240 hours)  Blood Culture (routine x 2)     Status: None (Preliminary result)   Collection Time: 05/31/23  2:58 PM   Specimen: Right Antecubital; Blood  Result Value Ref Range Status   Specimen Description   Final    RIGHT ANTECUBITAL BOTTLES DRAWN AEROBIC AND ANAEROBIC   Special Requests Blood Culture adequate volume  Final   Culture   Final    NO GROWTH 4 DAYS Performed at [facility], [address] [place], [place] [address]    Report Status PENDING  Incomplete  Blood Culture (routine x 2)     Status: None (Preliminary result)   Collection Time: 05/31/23  2:58 PM   Specimen: BLOOD RIGHT HAND  Result Value Ref Range Status   Specimen Description   Final    BLOOD RIGHT HAND BOTTLES DRAWN AEROBIC AND ANAEROBIC   Special Requests Blood Culture adequate volume  Final   Culture   Final    NO GROWTH 4 DAYS Performed at [facility], [address], [place], [place] [address]    Report Status PENDING  Incomplete  Urine Culture     Status: None   Collection Time: 05/31/23  4:18 PM   Specimen: Urine, Random  Result Value Ref Range Status   Specimen Description   Final    URINE, RANDOM Performed at [facility], [address] [place], [place] [address]    Special Requests   Final    NONE Reflexed from [id number] Performed at [facility], [address], [place], [place] [address]    Culture   Final    NO GROWTH Performed at [facility], [address], [place], [place] [address]    Report Status 06/02/2023 FINAL  Final  Resp panel by RT-PCR (RSV, Flu A&B, Covid) Anterior Nasal Swab     Status: Abnormal   Collection Time: 05/31/23  8:07 PM   Specimen: Anterior Nasal Swab  Result Value Ref Range Status   SARS Coronavirus 2 by RT PCR NEGATIVE NEGATIVE Final    Comment: (NOTE) SARS-CoV-2 target nucleic acids are NOT DETECTED.  The SARS-CoV-2 RNA is generally detectable in upper respiratory specimens during the acute phase of infection.  The lowest concentration of SARS-CoV-2 viral copies this assay can detect is 138 copies/mL. A negative result does not preclude SARS-Cov-2 infection and should not be used as the sole basis for treatment or other patient management decisions. A negative result may occur with  improper specimen collection/handling, submission of specimen other than nasopharyngeal swab, presence of viral mutation(s) within the areas targeted by this assay, and inadequate number of viral copies(<138 copies/mL). A negative result must be combined with clinical observations, patient history, and epidemiological information. The expected result is Negative.  Fact Sheet for Patients:  BloggerCourse.com  Fact Sheet for Healthcare Providers:  SeriousBroker.it  This test is no t yet approved or cleared by the [place] FDA and  has been authorized for detection and/or diagnosis of SARS-CoV-2 by FDA under an Emergency Use Authorization (EUA). This EUA will remain  in effect (meaning this test can be used) for the duration of the COVID-19 declaration under Section 564(b)(1) of the Act, 21 U.S.C.section 360bbb-3(b)(1), unless the authorization is terminated  or revoked sooner.       Influenza A by PCR NEGATIVE NEGATIVE Final   Influenza B by PCR NEGATIVE NEGATIVE Final    Comment: (NOTE) The Xpert Xpress SARS-CoV-2/FLU/RSV plus assay is intended as an aid in the diagnosis of influenza from Nasopharyngeal  swab specimens and should not be used as a sole basis for treatment. Nasal washings and aspirates are unacceptable for Xpert Xpress SARS-CoV-2/FLU/RSV testing.  Fact Sheet for Patients: BloggerCourse.com  Fact Sheet for Healthcare Providers: SeriousBroker.it  This test is not yet approved or cleared by the [place] FDA and has been authorized for detection and/or diagnosis of SARS-CoV-2 by FDA  under an Emergency Use Authorization (EUA). This EUA will remain in effect (meaning this test can be used) for the duration of the COVID-19 declaration under Section 564(b)(1) of the Act, 21 U.S.C. section 360bbb-3(b)(1), unless the authorization is terminated or revoked.     Resp Syncytial Virus by PCR POSITIVE (A) NEGATIVE Final    Comment: (NOTE) Fact Sheet for Patients: BloggerCourse.com  Fact Sheet for Healthcare Providers: SeriousBroker.it  This test is not yet approved or cleared by the [place] FDA and has been authorized for detection and/or diagnosis of SARS-CoV-2 by FDA under an Emergency Use Authorization (EUA). This EUA will remain in effect (meaning this test can be used) for the duration of the COVID-19 declaration under Section 564(b)(1) of the Act, 21 U.S.C. section 360bbb-3(b)(1), unless the authorization is terminated or revoked.  Performed at [facility], [address] [place], [place] [address]   MRSA Next Gen by PCR, Nasal     Status: Abnormal   Collection Time: 05/31/23  9:11 PM   Specimen: Nasal Mucosa; Nasal Swab  Result Value Ref Range Status   MRSA by PCR Next Gen DETECTED (A) NOT DETECTED Final    Comment: RESULT CALLED TO, READ BACK BY AND VERIFIED WITH: ESTOCE,R ON 06/02/23 AT 0037 BY PURDIE,J        The GeneXpert MRSA Assay (FDA approved for NASAL specimens only), is one component of a comprehensive MRSA colonization surveillance program. It is not intended to diagnose MRSA infection nor to guide or monitor treatment for MRSA infections. Performed at [facility], [address], [place], [place] [address]      Radiology Studies: No results found.  Scheduled Meds:  ascorbic acid  500 mg Oral Daily   Chlorhexidine Gluconate Cloth  6 each Topical Daily   docusate sodium  200 mg Oral Daily   feeding supplement  237 mL Oral BID BM   guaiFENesin-dextromethorphan  10 mL Oral Q8H    latanoprost  1 drop Both Eyes QHS   mupirocin ointment  1 Application Nasal BID   pantoprazole (PROTONIX) IV  40 mg Intravenous Q24H   umeclidinium bromide  1 puff Inhalation Daily   Continuous Infusions:   LOS: 4 days   Time spent: 45 minutes  Carollee Herter, DO  [facility]  06/04/2023, 11:36 AM

## 2023-06-04 NOTE — Assessment & Plan Note (Signed)
 06-04-2023 portal vein thrombosis has resolved. Can stop eliquis at discharge.

## 2023-06-05 ENCOUNTER — Inpatient Hospital Stay (HOSPITAL_COMMUNITY): Admitting: Certified Registered Nurse Anesthetist

## 2023-06-05 ENCOUNTER — Encounter (HOSPITAL_COMMUNITY): Payer: Self-pay

## 2023-06-05 ENCOUNTER — Encounter (HOSPITAL_COMMUNITY)
Admission: EM | Disposition: A | Discharge status: Skilled Nursing Facility | Payer: Self-pay | Source: Skilled Nursing Facility | Attending: Internal Medicine

## 2023-06-05 DIAGNOSIS — I81 Portal vein thrombosis: Secondary | ICD-10-CM | POA: Diagnosis not present

## 2023-06-05 DIAGNOSIS — A419 Sepsis, unspecified organism: Secondary | ICD-10-CM | POA: Diagnosis not present

## 2023-06-05 DIAGNOSIS — D649 Anemia, unspecified: Secondary | ICD-10-CM | POA: Diagnosis not present

## 2023-06-05 DIAGNOSIS — B338 Other specified viral diseases: Secondary | ICD-10-CM | POA: Diagnosis not present

## 2023-06-05 LAB — CBC WITH DIFFERENTIAL/PLATELET
Abs Immature Granulocytes: 0.8 10*3/uL — ABNORMAL HIGH (ref 0.00–0.07)
Abs Immature Granulocytes: 0.35 10*3/uL — ABNORMAL HIGH (ref 0.00–0.07)
Band Neutrophils: 2 %
Basophils Absolute: 0 10*3/uL (ref 0.0–0.1)
Basophils Absolute: 0.1 10*3/uL (ref 0.0–0.1)
Basophils Relative: 0 %
Basophils Relative: 1 %
Eosinophils Absolute: 0.1 10*3/uL (ref 0.0–0.5)
Eosinophils Absolute: 0.1 10*3/uL (ref 0.0–0.5)
Eosinophils Relative: 1 %
Eosinophils Relative: 1 %
HCT: 29.3 % — ABNORMAL LOW (ref 39.0–52.0)
HCT: 31.2 % — ABNORMAL LOW (ref 39.0–52.0)
Hemoglobin: 8.7 g/dL — ABNORMAL LOW (ref 13.0–17.0)
Hemoglobin: 9.3 g/dL — ABNORMAL LOW (ref 13.0–17.0)
Immature Granulocytes: 4 %
Lymphocytes Relative: 22 %
Lymphocytes Relative: 13 %
Lymphs Abs: 2.2 10*3/uL (ref 0.7–4.0)
Lymphs Abs: 1.1 10*3/uL (ref 0.7–4.0)
MCH: 28.3 pg (ref 26.0–34.0)
MCH: 28.7 pg (ref 26.0–34.0)
MCHC: 29.7 g/dL — ABNORMAL LOW (ref 30.0–36.0)
MCHC: 29.8 g/dL — ABNORMAL LOW (ref 30.0–36.0)
MCV: 95.4 fL (ref 80.0–100.0)
MCV: 96.3 fL (ref 80.0–100.0)
Metamyelocytes Relative: 4 %
Monocytes Absolute: 0.9 10*3/uL (ref 0.1–1.0)
Monocytes Absolute: 0.7 10*3/uL (ref 0.1–1.0)
Monocytes Relative: 9 %
Monocytes Relative: 9 %
Myelocytes: 4 %
Neutro Abs: 5.9 10*3/uL (ref 1.7–7.7)
Neutro Abs: 5.9 10*3/uL (ref 1.7–7.7)
Neutrophils Relative %: 58 %
Neutrophils Relative %: 72 %
Platelets: 347 10*3/uL (ref 150–400)
Platelets: 367 10*3/uL (ref 150–400)
RBC: 3.07 MIL/uL — ABNORMAL LOW (ref 4.22–5.81)
RBC: 3.24 MIL/uL — ABNORMAL LOW (ref 4.22–5.81)
RDW: 25.4 % — ABNORMAL HIGH (ref 11.5–15.5)
RDW: 25.2 % — ABNORMAL HIGH (ref 11.5–15.5)
Smear Review: ADEQUATE
Smear Review: NORMAL
WBC: 9.9 10*3/uL (ref 4.0–10.5)
WBC: 8.1 10*3/uL (ref 4.0–10.5)
nRBC: 0.8 % — ABNORMAL HIGH (ref 0.0–0.2)
nRBC: 0.4 % — ABNORMAL HIGH (ref 0.0–0.2)

## 2023-06-05 LAB — COMPREHENSIVE METABOLIC PANEL
ALT: 36 U/L (ref 0–44)
AST: 17 U/L (ref 15–41)
Albumin: 1.8 g/dL — ABNORMAL LOW (ref 3.5–5.0)
Alkaline Phosphatase: 102 U/L (ref 38–126)
Anion gap: 4 — ABNORMAL LOW (ref 5–15)
BUN: 8 mg/dL (ref 8–23)
CO2: 21 mmol/L — ABNORMAL LOW (ref 22–32)
Calcium: 7.8 mg/dL — ABNORMAL LOW (ref 8.9–10.3)
Chloride: 112 mmol/L — ABNORMAL HIGH (ref 98–111)
Creatinine, Ser: 0.61 mg/dL (ref 0.61–1.24)
GFR, Estimated: >60 mL/min (ref >60)
Glucose, Bld: 90 mg/dL (ref 70–99)
Potassium: 3.4 mmol/L — ABNORMAL LOW (ref 3.5–5.1)
Sodium: 137 mmol/L (ref 135–145)
Total Bilirubin: 1.2 mg/dL (ref 0.0–1.2)
Total Protein: 5.6 g/dL — ABNORMAL LOW (ref 6.5–8.1)

## 2023-06-05 LAB — CULTURE, BLOOD (ROUTINE X 2)
Special Requests: ADEQUATE
Special Requests: ADEQUATE

## 2023-06-05 SURGERY — EGD (ESOPHAGOGASTRODUODENOSCOPY)
Anesthesia: General

## 2023-06-05 MED ORDER — POTASSIUM CHLORIDE 10 MEQ/100ML IV SOLN
10.0000 meq | INTRAVENOUS | Status: AC
Start: 2023-06-05 — End: 2023-06-05
  Administered 2023-06-05 (×3): 10 meq via INTRAVENOUS
  Filled 2023-06-05 (×3): qty 100

## 2023-06-05 MED ORDER — SODIUM CHLORIDE 0.9 % IV SOLN: INTRAVENOUS | Status: DC

## 2023-06-05 MED ORDER — LACTATED RINGERS IV SOLN: INTRAVENOUS | Status: DC

## 2023-06-05 NOTE — Progress Notes (Signed)
 PROGRESS NOTE    Randy Russo  UJW:[id number] DOB: 1941-02-09 DOA: 05/31/2023 PCP: Assunta Found, MD  Subjective: Pt seen and examined. Chronically ill appearing. No family at bedside. On scheduled for EGD/colonoscopy today.   Hospital Course: HPI:  Randy Russo is a 83 y.o. 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  Patient unable to give history due to encephalopathy.  Patient is a resident of a skilled nursing facility he was brought here due to "his sepsis labs being abnormal".  Here, the patient was initially fairly lucid.  UA showed a bladder infection, he was started on Rocephin.  CBC showed anemia but his rectal exam was negative.  Shortly after his labs returned, he had an abrupt decline and became encephalopathic.  He also became tachycardic and tachypneic and quite agitated.  He was given ketamine with good effect.  CT of chest and CT of abdomen pelvis were done and were negative for acute infection.  Due to his anemia, a blood transfusion was started.   Significant Events: Admitted 05/31/2023 for anemia   Significant Labs: Lactic acid 1.5 Na 136, K 3.2, CO2 of 23, BUN 21, Scr 0.78, glu 115 WBC 24.8, HgB 6.1, plt 478 INR 1.8 Hemoccult Negative Procalcitonin 3.17 BNP 118 Fe 24, TIBC 115, %sat 21 B12 1055 RBC folate 10.4  Significant Imaging Studies: CXR No active disease  CTPA/CT abd/pelvis Examination pulmonary embolism significantly limited by breath  motion artifact. Within this limitation, no central or proximal segmental pulmonary embolus. 2. No acute CT findings of the chest,  abdomen, or pelvis to explain sepsis within significant limitations of breath motion, patient motion, and streak artifact. 3. Descending and sigmoid diverticulosis without evidence of acute diverticulitis. 4. Midline ventral  hernia containing nonobstructed mid small bowel. 5. Previously noted portal venous thrombus is not appreciated on today's examination and presumably resolved   Antibiotic Therapy: Anti-infectives (From admission, onward)    Start     Dose/Rate Route Frequency Ordered Stop   06/01/23 1830  vancomycin (VANCOREADY) IVPB 1750 mg/350 mL  Status:  Discontinued        1,750 mg 175 mL/hr over 120 Minutes Intravenous Every 24 hours 05/31/23 1839 06/03/23 1012   06/01/23 1800  cefTRIAXone (ROCEPHIN) 2 g in sodium chloride 0.9 % 100 mL IVPB  Status:  Discontinued        2 g 200 mL/hr over 30 Minutes Intravenous Every 24 hours 05/31/23 2226 06/01/23 0719   06/01/23 1400  piperacillin-tazobactam (ZOSYN) IVPB 3.375 g        3.375 g 12.5 mL/hr over 240 Minutes Intravenous Every 8 hours 06/01/23 1113     06/01/23 0830  piperacillin-tazobactam (ZOSYN) IVPB 3.375 g        3.375 g 100 mL/hr over 30 Minutes Intravenous  Once 06/01/23 0742 06/01/23 0834   05/31/23 1845  metroNIDAZOLE (FLAGYL) IVPB 500 mg  Status:  Discontinued        500 mg 100 mL/hr over 60 Minutes Intravenous 2 times daily 05/31/23 1830 06/01/23 0742   05/31/23 1845  vancomycin (VANCOREADY) IVPB 1750 mg/350 mL        1,750 mg 175 mL/hr over 120 Minutes Intravenous  Once 05/31/23 1834 05/31/23 2151   05/31/23 1600  cefTRIAXone (ROCEPHIN) 2 g in sodium chloride 0.9 % 100 mL IVPB        2 g 200 mL/hr  over 30 Minutes Intravenous Once 05/31/23 1549 05/31/23 1647       Procedures:   Consultants: GI Palliative care    Assessment and Plan: * Sepsis secondary [facility] On [facility]. Patient met sepsis criteria on admission-cephalopathic, tachypneic, tachycardic, WBC 24.8  >> 28.1, lactic acid 1.5, -Still meeting sepsis criteria, tachypneic, tachycardic, blood pressure soft. -Likely source urinary-UTI and RSV infection. -Will follow-up with blood and urine cultures. -Continue on broad-spectrum antibiotics: Zosyn, Flagyl, vancomycin  for now with a quick de-escalation 24 hours, based on cultures. -Sepsis protocol, s/p IV hydration -continue maintenance IV fluid with LR.  06-02-2023 to 06-03-2023 see Dr. Windell Norfolk documentation  06-04-2023  urine cx negative. Has had 72 hours of IV zosyn. Should be sufficient for culture negative UTI.  06-05-2023 resolved.  RSV infection On admission. - Continue supportive care. -Supplemental oxygen as needed. -Continue as needed Tylenol, mucolytics. -IV steroids with quick taper  06-02-2023 to 06-03-2023 see Dr. Windell Norfolk documentation   06-04-2023 on RA. Off steroids.  06-05-2023 resolved. On RA and off steroids.  Acute on chronic anemia On admission. - Anemia of chronic disease, on Eliquis. - POA: Globin down from 10.8  >> 6.1. -Hemoccult negative. -Overnight 06/01/2023 was transfused with 2U PRBCs. -Trend H&H closely, transfuse as needed     Latest Ref Rng & Units 06/01/2023    4:03 AM 05/31/2023    2:58 PM 05/08/2023   10:12 AM  CBC  WBC 4.0 - 10.5 K/uL 28.1  24.8  5.8   Hemoglobin 13.0 - 17.0 g/dL 7.5  6.1  16.1   Hematocrit 39.0 - 52.0 % 24.4  20.2  35.5   Platelets 150 - 400 K/uL 416  478  365    06-02-2023 to 06-03-2023 see Dr. Windell Norfolk documentation   06-04-2023 GI consulted on 06-02-2023. GI trying to decide if they are going to proceed with EGD/colonoscopy. Eliquis has been held. Last dose 06-03-2023 @ 0916  06-05-2023 for EGD/colonoscopy today. Hopefully DC to SNF tomorrow.  Portal vein thrombosis On admission. - Continue Eliquis. -COVID-negative  06-02-2023 to 06-03-2023 see Dr. Windell Norfolk documentation   06-04-2023 off Eliquis for possible EGD/colonoscopy.  On admission, prior portal vein thrombosis has resolved. Can stop Eliquis at discharge.  Chronic anticoagulation 06-04-2023 portal vein thrombosis has resolved. Can stop eliquis at discharge.  Essential hypertension On admission. - Hypertensive, with holding home BP meds. Ending IV fluid hydration  06-02-2023  to 06-03-2023 see Dr. Windell Norfolk documentation   06-04-2023 stable. BP meds on hold due to borderline low BP.  06-05-2023 Stable. Not on any HTN meds for now.  COPD (chronic obstructive pulmonary disease) (HCC) On admission. Upper respiratory symptoms likely due to RSV. -Wheezing, with rhonchi, -Currently on room air, satting 91%. -Continue O2 supplement via nasal cannula, maintaining O2 sat greater than 92%. -As needed DuoNeb bronchodilator treatment, as needed mucolytics  06-02-2023 to 06-03-2023 see Dr. Windell Norfolk documentation   06-04-2023 on RA. No wheezing. Continue inhalers and prn nebs.  06-05-2023 chronic. Stable.  DNR (do not resuscitate)/DNI(Do Not Intubate) Made DNR/DNI on admission.  Pressure injury of skin-resolved as of 06/04/2023 - Continue wound care per RN-consult placed -Rotating patient every 2 hours,   DVT prophylaxis: SCD     Code Status: Limited: Do not attempt resuscitation (DNR) -DNR-LIMITED -Do Not Intubate/DNI  Family Communication: no family at bedside Disposition Plan: SNF Reason for continuing need for hospitalization: EGD/colonoscopy today. Plan for DC tomorrow to SNF.  Objective: Vitals:   06/05/23 0200 06/05/23 0328 06/05/23 0400  06/05/23 0912  BP: 112/61  (!) 118/57   Pulse:  81    Resp: 20  18   Temp:  98.8 F (37.1 C)    TempSrc:  Axillary    SpO2:  95%  95%  Weight:      Height:        Intake/Output Summary (Last 24 hours) at 06/05/2023 1141 Last data filed at 06/05/2023 0936 Gross per 24 hour  Intake --  Output 3100 ml  Net -3100 ml   Filed Weights   05/31/23 1356 05/31/23 2226  Weight: 85.7 kg 86.2 kg    Examination:  Physical Exam Vitals and nursing note reviewed.  Constitutional:      General: He is not in acute distress.    Appearance: He is not toxic-appearing.     Comments: Sleeping. On RA.  HENT:     Head: Normocephalic and atraumatic.     Nose: Nose normal.  Cardiovascular:     Rate and Rhythm: Normal rate and  regular rhythm.  Pulmonary:     Effort: Pulmonary effort is normal. No respiratory distress.     Breath sounds: Normal breath sounds.  Abdominal:     General: Bowel sounds are normal. There is no distension.     Palpations: Abdomen is soft.  Musculoskeletal:     Right lower leg: No edema.     Left lower leg: No edema.  Skin:    General: Skin is warm and dry.     Capillary Refill: Capillary refill takes less than 2 seconds.     Data Reviewed: I have personally reviewed following labs and imaging studies  CBC: Recent Labs  Lab 05/31/23 1458 06/01/23 0403 06/02/23 0440 06/03/23 0413 06/04/23 0413 06/05/23 0423  WBC 24.8* 28.1* 26.1* 23.0* 13.5* 9.9  NEUTROABS 21.8*  --  24.0* 18.4* 10.8* 5.9  HGB 6.1* 7.5* 8.1* 8.7* 8.6* 8.7*  HCT 20.2* 24.4* 27.2* 28.8* 29.5* 29.3*  MCV 94.0 92.4 94.1 94.4 96.1 95.4  PLT 478* 416* 446* 458* 381 347   Basic Metabolic Panel: Recent Labs  Lab 06/01/23 0403 06/02/23 0440 06/03/23 0413 06/04/23 0413 06/05/23 0423  NA 138 140 138 138 137  K 3.4* 4.1 3.6 3.8 3.4*  CL 114* 112* 110 111 112*  CO2 20* 21* 22 21* 21*  GLUCOSE 141* 121* 93 104* 90  BUN 15 17 13 11 8   CREATININE 0.57* 0.68 0.67 0.68 0.61  CALCIUM 7.2* 8.3* 8.1* 8.0* 7.8*  MG 1.9  --   --   --   --   PHOS 3.6  --   --   --   --    GFR: Estimated Creatinine Clearance: 73.3 mL/min (by C-G formula based on SCr of 0.61 mg/dL). Liver Function Tests: Recent Labs  Lab 06/01/23 0403 06/02/23 0440 06/03/23 0413 06/04/23 0413 06/05/23 0423  AST 22 23 31 26 17   ALT 32 32 45* 45* 36  ALKPHOS 105 110 117 113 102  BILITOT 2.7* 1.8* 1.4* 1.2 1.2  PROT 5.9* 6.4* 6.0* 5.7* 5.6*  ALBUMIN 1.6* 1.9* 1.8* 1.8* 1.8*    Recent Labs  Lab 05/31/23 1858  AMMONIA 21   Coagulation Profile: Recent Labs  Lab 05/31/23 1458  INR 1.8*   BNP (last 3 results) Recent Labs    04/12/23 1822 06/01/23 0401  BNP 58.0 118.0*   CBG: Recent Labs  Lab 05/31/23 1820  GLUCAP 133*    Sepsis Labs: Recent Labs  Lab 05/31/23 1458 05/31/23 1648 06/01/23  0401  PROCALCITON  --   --  3.17  LATICACIDVEN 1.5 1.5  --     Recent Results (from the past 240 hours)  Blood Culture (routine x 2)     Status: None   Collection Time: 05/31/23  2:58 PM   Specimen: Right Antecubital; Blood  Result Value Ref Range Status   Specimen Description   Final    RIGHT ANTECUBITAL BOTTLES DRAWN AEROBIC AND ANAEROBIC   Special Requests Blood Culture adequate volume  Final   Culture   Final    NO GROWTH 5 DAYS Performed at [facility], [address] [place], [place] [address]    Report Status 06/05/2023 FINAL  Final  Blood Culture (routine x 2)     Status: None   Collection Time: 05/31/23  2:58 PM   Specimen: BLOOD RIGHT HAND  Result Value Ref Range Status   Specimen Description   Final    BLOOD RIGHT HAND BOTTLES DRAWN AEROBIC AND ANAEROBIC   Special Requests Blood Culture adequate volume  Final   Culture   Final    NO GROWTH 5 DAYS Performed at [facility], [address], [place], [place] [address]    Report Status 06/05/2023 FINAL  Final  Urine Culture     Status: None   Collection Time: 05/31/23  4:18 PM   Specimen: Urine, Random  Result Value Ref Range Status   Specimen Description   Final    URINE, RANDOM Performed at [facility], [address], [place], [place] [address]    Special Requests   Final    NONE Reflexed from [id number] Performed at [facility], [address] [place], [place] [address]    Culture   Final    NO GROWTH Performed at [facility], [address], [place], [place] [address]    Report Status 06/02/2023 FINAL  Final  Resp panel by RT-PCR (RSV, Flu A&B, Covid) Anterior Nasal Swab     Status: Abnormal   Collection Time: 05/31/23  8:07 PM   Specimen: Anterior Nasal Swab  Result Value Ref Range Status   SARS Coronavirus 2 by RT PCR NEGATIVE NEGATIVE Final    Comment: (NOTE) SARS-CoV-2 target nucleic acids are NOT DETECTED.  The  SARS-CoV-2 RNA is generally detectable in upper respiratory specimens during the acute phase of infection. The lowest concentration of SARS-CoV-2 viral copies this assay can detect is 138 copies/mL. A negative result does not preclude SARS-Cov-2 infection and should not be used as the sole basis for treatment or other patient management decisions. A negative result may occur with  improper specimen collection/handling, submission of specimen other than nasopharyngeal swab, presence of viral mutation(s) within the areas targeted by this assay, and inadequate number of viral copies(<138 copies/mL). A negative result must be combined with clinical observations, patient history, and epidemiological information. The expected result is Negative.  Fact Sheet for Patients:  BloggerCourse.com  Fact Sheet for Healthcare Providers:  SeriousBroker.it  This test is no t yet approved or cleared by the [place] FDA and  has been authorized for detection and/or diagnosis of SARS-CoV-2 by FDA under an Emergency Use Authorization (EUA). This EUA will remain  in effect (meaning this test can be used) for the duration of the COVID-19 declaration under Section 564(b)(1) of the Act, 21 U.S.C.section 360bbb-3(b)(1), unless the authorization is terminated  or revoked sooner.       Influenza A by PCR NEGATIVE NEGATIVE Final   Influenza B by PCR  NEGATIVE NEGATIVE Final    Comment: (NOTE) The Xpert Xpress SARS-CoV-2/FLU/RSV plus assay is intended as an aid in the diagnosis of influenza from Nasopharyngeal swab specimens and should not be used as a sole basis for treatment. Nasal washings and aspirates are unacceptable for Xpert Xpress SARS-CoV-2/FLU/RSV testing.  Fact Sheet for Patients: BloggerCourse.com  Fact Sheet for Healthcare Providers: SeriousBroker.it  This test is not yet approved or  cleared by the [place] FDA and has been authorized for detection and/or diagnosis of SARS-CoV-2 by FDA under an Emergency Use Authorization (EUA). This EUA will remain in effect (meaning this test can be used) for the duration of the COVID-19 declaration under Section 564(b)(1) of the Act, 21 U.S.C. section 360bbb-3(b)(1), unless the authorization is terminated or revoked.     Resp Syncytial Virus by PCR POSITIVE (A) NEGATIVE Final    Comment: (NOTE) Fact Sheet for Patients: BloggerCourse.com  Fact Sheet for Healthcare Providers: SeriousBroker.it  This test is not yet approved or cleared by the [place] FDA and has been authorized for detection and/or diagnosis of SARS-CoV-2 by FDA under an Emergency Use Authorization (EUA). This EUA will remain in effect (meaning this test can be used) for the duration of the COVID-19 declaration under Section 564(b)(1) of the Act, 21 U.S.C. section 360bbb-3(b)(1), unless the authorization is terminated or revoked.  Performed at [facility], [address] [place], [place] [address]   MRSA Next Gen by PCR, Nasal     Status: Abnormal   Collection Time: 05/31/23  9:11 PM   Specimen: Nasal Mucosa; Nasal Swab  Result Value Ref Range Status   MRSA by PCR Next Gen DETECTED (A) NOT DETECTED Final    Comment: RESULT CALLED TO, READ BACK BY AND VERIFIED WITH: ESTOCE,R ON 06/02/23 AT 0037 BY PURDIE,J        The GeneXpert MRSA Assay (FDA approved for NASAL specimens only), is one component of a comprehensive MRSA colonization surveillance program. It is not intended to diagnose MRSA infection nor to guide or monitor treatment for MRSA infections. Performed at [facility], [address], [place], [place] [address]     Scheduled Meds:  ascorbic acid  500 mg Oral Daily   docusate sodium  200 mg Oral Daily   feeding supplement  237 mL Oral BID BM   guaiFENesin-dextromethorphan  10 mL  Oral Q8H   latanoprost  1 drop Both Eyes QHS   mupirocin ointment  1 Application Nasal BID   pantoprazole (PROTONIX) IV  40 mg Intravenous Q24H   umeclidinium bromide  1 puff Inhalation Daily   Continuous Infusions:  potassium chloride 10 mEq (06/05/23 1129)     LOS: 5 days   Time spent: 45 minutes  Carollee Herter, DO  [facility]  06/05/2023, 11:41 AM

## 2023-06-05 NOTE — Progress Notes (Signed)
 Central  Tele called to report an 8 beat run of [facility] at 0620. Pt is currently resting comfortably and in NSR. Kellogg RN

## 2023-06-05 NOTE — Plan of Care (Signed)

## 2023-06-05 NOTE — Plan of Care (Signed)
   Problem: Education: Goal: Knowledge of General Education information will improve Description Including pain rating scale, medication(s)/side effects and non-pharmacologic comfort measures Outcome: Progressing   Problem: Health Behavior/Discharge Planning: Goal: Ability to manage health-related needs will improve Outcome: Progressing

## 2023-06-05 NOTE — Anesthesia Preprocedure Evaluation (Signed)
 Anesthesia Evaluation  Patient identified by MRN, date of birth, ID band Patient awake    Reviewed: Allergy & Precautions, H&P , NPO status , Patient's Chart, lab work & pertinent test results, reviewed documented beta blocker date and time   Airway Mallampati: II  TM Distance: >3 FB Neck ROM: full    Dental no notable dental hx.    Pulmonary neg pulmonary ROS, COPD, former smoker   Pulmonary exam normal breath sounds clear to auscultation       Cardiovascular Exercise Tolerance: Good hypertension, negative cardio ROS  Rhythm:regular Rate:Normal     Neuro/Psych negative neurological ROS  negative psych ROS   GI/Hepatic negative GI ROS, Neg liver ROS,GERD  ,,  Endo/Other  negative endocrine ROS    Renal/GU negative Renal ROS  negative genitourinary   Musculoskeletal   Abdominal   Peds  Hematology negative hematology ROS (+) Blood dyscrasia, anemia   Anesthesia Other Findings   Reproductive/Obstetrics negative OB ROS                             Anesthesia Physical Anesthesia Plan  ASA: 3 and emergent  Anesthesia Plan: General   Post-op Pain Management:    Induction:   PONV Risk Score and Plan: Propofol infusion  Airway Management Planned:   Additional Equipment:   Intra-op Plan:   Post-operative Plan:   Informed Consent: I have reviewed the patients History and Physical, chart, labs and discussed the procedure including the risks, benefits and alternatives for the proposed anesthesia with the patient or authorized representative who has indicated his/her understanding and acceptance.     Dental Advisory Given  Plan Discussed with: CRNA  Anesthesia Plan Comments:        Anesthesia Quick Evaluation

## 2023-06-05 NOTE — Progress Notes (Signed)
 This is a 83 year old male who was planned for bidirectional endoscopy today for acute on chronic anemia where he presented with hemoglobin of 6.1.  Patient was reassessed before the procedure again for the second time by anesthesia (Dr Johnnette Litter) and myself and clinically does not appear to be adequate for endoscopic intervention and anesthesia.  Patient appears to be dyspneic and is not able to complete 1 sentence without being short of breath.  Also although patient had anemia his hemoglobin has been stable without further transfusion requirements and there has been no overt bleed while he has been in the hospital with negative Hemoccult as well.  Patient is hospice candidate but as per last palliative care note family wants " treat the treatable ".  Given there is no overt bleeding without any further transfusion requirements and patient clinically does not appear to be fit for endoscopic intervention we decided to defer this procedure.  Can restart diet and repeat hemoglobin tonight, if any downtrend is reported keep n.p.o. past midnight to assess again for tomorrow morning.  Also I had a lengthy discussion with the patient granddaughter Lum Babe over the phone regarding patient and his goals of care.  She is very clear that if "blood count are stable and coming up "and there is no active overt bleeding we can hold onto the procedure.  She is very clear that procedures to be pursued only if absolutely necessary.   booker,pamela (Granddaughter) [phone] ([place])

## 2023-06-05 NOTE — Interval H&P Note (Addendum)
 History and Physical Interval Note:  06/05/2023 3:12 PM  Randy Russo  has presented today for surgery, with the diagnosis of iron deficiency anemia.  The various methods of treatment have been discussed with the patient and family. After consideration of risks, benefits and other options for treatment, the patient has consented to  Procedure(s): EGD (ESOPHAGOGASTRODUODENOSCOPY) (N/A) COLONOSCOPY (N/A) as a surgical intervention.  The patient's history has been reviewed, patient examined, no change in status, stable for surgery.  I have reviewed the patient's chart and labs.  Questions were answered to the patient's satisfaction.    [name]

## 2023-06-05 NOTE — Progress Notes (Signed)
 Patient seen and evaluated in pre-op.  As discussed previously, this patient is acutely ill with a number of chronic conditions.  On exam, he is tachypnic and somewhat confused.  He is RSV+.  Clearly any anesthetic has risks/benefits.  In this situation, the proposed anesthetic which will last 45-60 minutes in duration puts the patient at high risk of respiratory or circulatory collapse.  In light of a stable Hgb and with negative FOBT, I recommended to the GI service to cancel the proposed procedure and treat the patient conservatively instead.

## 2023-06-06 ENCOUNTER — Other Ambulatory Visit: Payer: Self-pay

## 2023-06-06 ENCOUNTER — Telehealth: Payer: Self-pay | Admitting: Gastroenterology

## 2023-06-06 DIAGNOSIS — I81 Portal vein thrombosis: Secondary | ICD-10-CM | POA: Diagnosis not present

## 2023-06-06 DIAGNOSIS — A419 Sepsis, unspecified organism: Secondary | ICD-10-CM | POA: Diagnosis not present

## 2023-06-06 DIAGNOSIS — D649 Anemia, unspecified: Secondary | ICD-10-CM

## 2023-06-06 DIAGNOSIS — B338 Other specified viral diseases: Secondary | ICD-10-CM | POA: Diagnosis not present

## 2023-06-06 LAB — CBC
HCT: 29.6 % — ABNORMAL LOW (ref 39.0–52.0)
Hemoglobin: 8.8 g/dL — ABNORMAL LOW (ref 13.0–17.0)
MCH: 28.4 pg (ref 26.0–34.0)
MCHC: 29.7 g/dL — ABNORMAL LOW (ref 30.0–36.0)
MCV: 95.5 fL (ref 80.0–100.0)
Platelets: 362 10*3/uL (ref 150–400)
RBC: 3.1 MIL/uL — ABNORMAL LOW (ref 4.22–5.81)
RDW: 25 % — ABNORMAL HIGH (ref 11.5–15.5)
WBC: 7.5 10*3/uL (ref 4.0–10.5)
nRBC: 0 % (ref 0.0–0.2)

## 2023-06-06 MED ORDER — MUPIROCIN 2 % EX OINT: 1.0000 | TOPICAL_OINTMENT | Freq: Every day | CUTANEOUS | Status: AC

## 2023-06-06 MED ORDER — ASCORBIC ACID 500 MG PO TABS: 500.0000 mg | ORAL_TABLET | Freq: Every day | ORAL | Status: AC

## 2023-06-06 NOTE — Telephone Encounter (Addendum)
 Mandy -please arrange hospital follow-up for patient and 3-4 weeks with LSL, AB, KH, or Dr. Levon Hedger.  Tammy -can you please make sure SNF obtain CBC in 1 week.  Brooke Bonito, MSN, APRN, FNP-BC, AGACNP-BC [facility]  Addendum: Angelica Chessman - this was supposed to include myself for follow up. LSL has not seen patient in a while. Can be with KH, AB, myself or Dr. Levon Hedger

## 2023-06-06 NOTE — Progress Notes (Signed)
 Attempted to call report to [facility], no answers.

## 2023-06-06 NOTE — [place] FL2 (Signed)
 [place] MEDICAID FL2 LEVEL OF CARE FORM     IDENTIFICATION  Patient Name: Randy Russo Birthdate: 04/27/1940 Sex: male Admission Date (Current Location): 05/31/2023  [facility] and [place] Number:  [employer] and Address:  [facility],  [address], [place] [address]      Provider Number: [id number]  Attending Physician Name and Address:  Carollee Herter, DO  Relative Name and Phone Number:  Lum Babe ( granddaughter) [phone]    Current Level of Care: Hospital Recommended Level of Care: Skilled Nursing Facility Prior Approval Number:    Date Approved/Denied:   PASRR Number: [id number]  Discharge Plan: SNF    Current Diagnoses: Patient Active Problem List   Diagnosis Date Noted   DNR (do not resuscitate)/DNI(Do Not Intubate) 06/04/2023   Chronic anticoagulation 06/03/2023   Symptomatic anemia 06/03/2023   RSV infection 06/01/2023   Sepsis secondary to UTI (HCC) 05/31/2023   Acute on chronic anemia 04/24/2023   Portal vein thrombosis 04/22/2023   Ambulatory dysfunction 04/12/2023   Right inguinal hernia    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   Gastroesophageal reflux disease without esophagitis    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    COPD (chronic obstructive pulmonary disease) (HCC) 12/27/2017   Essential hypertension 12/27/2017    Orientation RESPIRATION BLADDER Height & Weight     Self, Time, Situation, Place  Normal Incontinent Weight: 190 lb 0.6 oz (86.2 kg) Height:  5\' 6"  (167.6 cm)  BEHAVIORAL SYMPTOMS/MOOD NEUROLOGICAL BOWEL NUTRITION STATUS      Incontinent Diet (See DC summary)  AMBULATORY STATUS COMMUNICATION OF NEEDS Skin   Extensive Assist Verbally Normal                       Personal Care Assistance Level of Assistance  Bathing, Feeding, Dressing Bathing Assistance: Maximum assistance Feeding assistance: Independent Dressing Assistance: Maximum assistance      Functional Limitations Info  Sight, Hearing, Speech Sight Info: Impaired Hearing Info: Adequate Speech Info: Adequate    SPECIAL CARE FACTORS FREQUENCY                       Contractures Contractures Info: Not present    Additional Factors Info  Code Status, Allergies Code Status Info: DNR-Limited Allergies Info: NKA           Current Medications (06/06/2023):  This is the current hospital active medication list Current Facility-Administered Medications  Medication Dose Route Frequency Provider Last Rate Last Admin   acetaminophen (TYLENOL) tablet 650 mg  650 mg Oral Q6H PRN Shahmehdi, Seyed A, MD   650 mg at 06/06/23 1006   albuterol (PROVENTIL) (2.5 MG/3ML) 0.083% nebulizer solution 2.5 mg  2.5 mg Nebulization Q2H PRN Levie Heritage, DO       ascorbic acid (VITAMIN C) tablet 500 mg  500 mg Oral Daily Shahmehdi, Seyed A, MD   500 mg at 06/06/23 1006   docusate sodium (COLACE) capsule 200 mg  200 mg Oral Daily Levie Heritage, DO   200 mg at 06/06/23 1006   feeding supplement (ENSURE ENLIVE / ENSURE PLUS) liquid 237 mL  237 mL Oral BID BM Levie Heritage, DO   237 mL at 06/06/23 1007   guaiFENesin-dextromethorphan (ROBITUSSIN DM) 100-10 MG/5ML syrup 10 mL  10 mL Oral Q8H Shahmehdi, Seyed A, MD   10 mL at 06/05/23 2211   latanoprost (XALATAN) 0.005 % ophthalmic solution 1 drop  1 drop Both Eyes QHS Levie Heritage, DO   1 drop at 06/05/23 2212   loperamide (IMODIUM) capsule 2 mg  2 mg Oral QID PRN Dorcas Carrow, MD   2 mg at 06/03/23 1856   ondansetron (ZOFRAN) tablet 4 mg  4 mg Oral Q6H PRN Levie Heritage, DO       Or   ondansetron [facility]) injection 4 mg  4 mg Intravenous Q6H PRN Levie Heritage, DO       Oral care mouth rinse  15 mL Mouth Rinse PRN Levie Heritage, DO       pantoprazole (PROTONIX) injection 40 mg  40 mg Intravenous Q24H Gelene Mink, NP   40 mg at 06/04/23 1341   umeclidinium bromide (INCRUSE ELLIPTA) 62.5 MCG/ACT 1 puff  1 puff Inhalation  Daily Levie Heritage, DO   1 puff at 06/06/23 0808     Discharge Medications: Please see discharge summary for a list of discharge medications.  Relevant Imaging Results:  Relevant Lab Results:   Additional Information SSN: [id number] [place]

## 2023-06-06 NOTE — Plan of Care (Signed)
   Problem: Education: Goal: Knowledge of General Education information will improve Description Including pain rating scale, medication(s)/side effects and non-pharmacologic comfort measures Outcome: Progressing   Problem: Health Behavior/Discharge Planning: Goal: Ability to manage health-related needs will improve Outcome: Progressing

## 2023-06-06 NOTE — Progress Notes (Addendum)
 PROGRESS NOTE    Randy Russo  KGM:[id number] DOB: 28-Nov-1940 DOA: 05/31/2023 PCP: Assunta Found, MD  Subjective: Pt seen and examined. No family at bedside. EGD/Colonoscopy canceled yesterday by GI and anesthesia. They did not feel the benefits of procedure outweighed risks. They felt that risks were too great given stable HgB and no evidence of bleeding.   Hospital Course: HPI:  Randy Russo is a 83 y.o. 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  Patient unable to give history due to encephalopathy.  Patient is a resident of a skilled nursing facility he was brought here due to "his sepsis labs being abnormal".  Here, the patient was initially fairly lucid.  UA showed a bladder infection, he was started on Rocephin.  CBC showed anemia but his rectal exam was negative.  Shortly after his labs returned, he had an abrupt decline and became encephalopathic.  He also became tachycardic and tachypneic and quite agitated.  He was given ketamine with good effect.  CT of chest and CT of abdomen pelvis were done and were negative for acute infection.  Due to his anemia, a blood transfusion was started.   Significant Events: Admitted 05/31/2023 for anemia   Significant Labs: Lactic acid 1.5 Na 136, K 3.2, CO2 of 23, BUN 21, Scr 0.78, glu 115 WBC 24.8, HgB 6.1, plt 478 INR 1.8 Hemoccult Negative Procalcitonin 3.17 BNP 118 Fe 24, TIBC 115, %sat 21 B12 1055 RBC folate 10.4  Significant Imaging Studies: CXR No active disease  CTPA/CT abd/pelvis Examination pulmonary embolism significantly limited by breath  motion artifact. Within this limitation, no central or proximal segmental pulmonary embolus. 2. No acute CT findings of the chest,  abdomen, or pelvis to explain sepsis within significant limitations of breath motion,  patient motion, and streak artifact. 3. Descending and sigmoid diverticulosis without evidence of acute diverticulitis. 4. Midline ventral hernia containing nonobstructed mid small bowel. 5. Previously noted portal venous thrombus is not appreciated on today's examination and presumably resolved   Antibiotic Therapy: Anti-infectives (From admission, onward)    Start     Dose/Rate Route Frequency Ordered Stop   06/01/23 1830  vancomycin (VANCOREADY) IVPB 1750 mg/350 mL  Status:  Discontinued        1,750 mg 175 mL/hr over 120 Minutes Intravenous Every 24 hours 05/31/23 1839 06/03/23 1012   06/01/23 1800  cefTRIAXone (ROCEPHIN) 2 g in sodium chloride 0.9 % 100 mL IVPB  Status:  Discontinued        2 g 200 mL/hr over 30 Minutes Intravenous Every 24 hours 05/31/23 2226 06/01/23 0719   06/01/23 1400  piperacillin-tazobactam (ZOSYN) IVPB 3.375 g        3.375 g 12.5 mL/hr over 240 Minutes Intravenous Every 8 hours 06/01/23 1113     06/01/23 0830  piperacillin-tazobactam (ZOSYN) IVPB 3.375 g        3.375 g 100 mL/hr over 30 Minutes Intravenous  Once 06/01/23 0742 06/01/23 0834   05/31/23 1845  metroNIDAZOLE (FLAGYL) IVPB 500 mg  Status:  Discontinued        500 mg 100 mL/hr over 60 Minutes Intravenous 2 times daily 05/31/23 1830 06/01/23 0742   05/31/23 1845  vancomycin (VANCOREADY) IVPB 1750 mg/350 mL        1,750 mg 175 mL/hr over 120 Minutes Intravenous  Once 05/31/23 1834 05/31/23 2151   05/31/23 1600  cefTRIAXone (ROCEPHIN) 2 g in sodium chloride 0.9 % 100 mL IVPB        2 g 200 mL/hr over 30 Minutes Intravenous Once 05/31/23 1549 05/31/23 1647       Procedures:   Consultants: GI Palliative care    Assessment and Plan: * Sepsis secondary to UTI [facility]) On admission. Patient met sepsis criteria on admission-cephalopathic, tachypneic, tachycardic, WBC 24.8  >> 28.1, lactic acid 1.5, -Still meeting sepsis criteria, tachypneic, tachycardic, blood pressure soft. -Likely source  urinary-UTI and RSV infection. -Will follow-up with blood and urine cultures. -Continue on broad-spectrum antibiotics: Zosyn, Flagyl, vancomycin for now with a quick de-escalation 24 hours, based on cultures. -Sepsis protocol, s/p IV hydration -continue maintenance IV fluid with LR.  06-02-2023 to 06-03-2023 see Dr. Windell Norfolk documentation  06-04-2023  urine cx negative. Has had 72 hours of IV zosyn. Should be sufficient for culture negative UTI.  06-05-2023 resolved.  RSV infection On admission. - Continue supportive care. -Supplemental oxygen as needed. -Continue as needed Tylenol, mucolytics. -IV steroids with quick taper  06-02-2023 to 06-03-2023 see Dr. Windell Norfolk documentation   06-04-2023 on RA. Off steroids.  06-05-2023 resolved. On RA and off steroids.  Acute on chronic anemia On admission. - Anemia of chronic disease, on Eliquis. - POA: Globin down from 10.8  >> 6.1. -Hemoccult negative. -Overnight 06/01/2023 was transfused with 2U PRBCs. -Trend H&H closely, transfuse as needed     Latest Ref Rng & Units 06/01/2023    4:03 AM 05/31/2023    2:58 PM 05/08/2023   10:12 AM  CBC  WBC 4.0 - 10.5 K/uL 28.1  24.8  5.8   Hemoglobin 13.0 - 17.0 g/dL 7.5  6.1  56.3   Hematocrit 39.0 - 52.0 % 24.4  20.2  35.5   Platelets 150 - 400 K/uL 416  478  365    06-02-2023 to 06-03-2023 see Dr. Windell Norfolk documentation   06-04-2023 GI consulted on 06-02-2023. GI trying to decide if they are going to proceed with EGD/colonoscopy. Eliquis has been held. Last dose 06-03-2023 @ 0916  06-05-2023 for EGD/colonoscopy today. Hopefully DC to SNF tomorrow.  06-06-2023 EGD/Colonoscopy canceled yesterday by GI and anesthesia. They did not feel the benefits of procedure outweighed risks. They felt that risks were too great given stable HgB and no evidence of bleeding.   DC to home. He can f/u with outpatient GI if family want to pursue outpatient endoscopy. DC hgB of 8.8.  will need repeat HgB in 1 week at SNF.  Forward results to [facility].  Portal vein thrombosis On admission. - Continue Eliquis. -COVID-negative  06-02-2023 to 06-03-2023 see Dr. Windell Norfolk documentation   06-04-2023 off Eliquis for possible EGD/colonoscopy.  On admission, prior portal vein thrombosis has resolved. Can stop Eliquis at discharge.  06-06-2023 resolved. Will stop Eliquis at discharge.  Chronic anticoagulation 06-04-2023 portal vein thrombosis has resolved. Can stop eliquis at discharge.  Essential hypertension On admission. - Hypertensive, with holding home BP meds. Ending IV fluid hydration  06-02-2023 to 06-03-2023 see Dr. Windell Norfolk documentation   06-04-2023 stable. BP meds on hold due to borderline low BP.  06-05-2023 Stable. Not on any HTN meds for now.  COPD (chronic obstructive pulmonary disease) (HCC) On admission. Upper respiratory symptoms likely due to RSV. -Wheezing, with rhonchi, -Currently on room air, satting 91%. -Continue O2 supplement via nasal cannula, maintaining O2 sat greater than 92%. -As needed DuoNeb bronchodilator treatment, as needed mucolytics  06-02-2023 to 06-03-2023 see Dr. Windell Norfolk documentation  06-04-2023 on RA. No wheezing. Continue inhalers and prn nebs.  06-05-2023 chronic. Stable.  DNR (do not resuscitate)/DNI(Do Not Intubate) Made DNR/DNI on admission.    Pressure injury of skin-resolved as of 06/04/2023 - Continue wound care per RN-consult placed -Rotating patient every 2 hours,   DVT prophylaxis:  SCD    Code Status: Limited: Do not attempt resuscitation (DNR) -DNR-LIMITED -Do Not Intubate/DNI  Family Communication: no family at bedside Disposition Plan: SNF Reason for continuing need for hospitalization: medically stable for DC.  Objective: Vitals:   06/05/23 1315 06/05/23 1604 06/05/23 2300 06/06/23 0500  BP: 115/67 115/65 118/60 103/76  Pulse: 78 75 80 70  Resp: 14 16 14 16   Temp: 98.1 F (36.7 C) 98.3 F (36.8 C) 98.5 F (36.9 C) 98.2 F  (36.8 C)  TempSrc: Oral Oral Oral Oral  SpO2: 100% 100% 100% 100%  Weight: 86.2 kg     Height: 5\' 6"  (1.676 m)       Intake/Output Summary (Last 24 hours) at 06/06/2023 1018 Last data filed at 06/06/2023 0500 Gross per 24 hour  Intake 1740 ml  Output 940 ml  Net 800 ml   Filed Weights   05/31/23 1356 05/31/23 2226 06/05/23 1315  Weight: 85.7 kg 86.2 kg 86.2 kg    Examination:  Physical Exam Vitals and nursing note reviewed.  Constitutional:      General: He is not in acute distress.    Appearance: He is not toxic-appearing or diaphoretic.  HENT:     Head: Normocephalic and atraumatic.     Nose: Nose normal.  Eyes:     General: No scleral icterus. Cardiovascular:     Rate and Rhythm: Normal rate and regular rhythm.  Pulmonary:     Effort: Pulmonary effort is normal. No respiratory distress.     Breath sounds: Normal breath sounds.  Abdominal:     General: Bowel sounds are normal. There is no distension.     Palpations: Abdomen is soft.  Musculoskeletal:     Right lower leg: No edema.     Left lower leg: No edema.  Skin:    General: Skin is warm and dry.     Capillary Refill: Capillary refill takes less than 2 seconds.  Neurological:     Mental Status: He is alert.     Comments: Mild dysarthria. Edentulous. Makes him hard to understand.     Data Reviewed: I have personally reviewed following labs and imaging studies  CBC: Recent Labs  Lab 06/02/23 0440 06/03/23 0413 06/04/23 0413 06/05/23 0423 06/05/23 1908 06/06/23 0420  WBC 26.1* 23.0* 13.5* 9.9 8.1 7.5  NEUTROABS 24.0* 18.4* 10.8* 5.9 5.9  --   HGB 8.1* 8.7* 8.6* 8.7* 9.3* 8.8*  HCT 27.2* 28.8* 29.5* 29.3* 31.2* 29.6*  MCV 94.1 94.4 96.1 95.4 96.3 95.5  PLT 446* 458* 381 347 367 362   Basic Metabolic Panel: Recent Labs  Lab 06/01/23 0403 06/02/23 0440 06/03/23 0413 06/04/23 0413 06/05/23 0423  NA 138 140 138 138 137  K 3.4* 4.1 3.6 3.8 3.4*  CL 114* 112* 110 111 112*  CO2 20* 21* 22 21*  21*  GLUCOSE 141* 121* 93 104* 90  BUN 15 17 13 11 8   CREATININE 0.57* 0.68 0.67 0.68 0.61  CALCIUM 7.2* 8.3* 8.1* 8.0* 7.8*  MG 1.9  --   --   --   --   PHOS 3.6  --   --   --   --  GFR: Estimated Creatinine Clearance: 73.3 mL/min (by C-G formula based on SCr of 0.61 mg/dL). Liver Function Tests: Recent Labs  Lab 06/01/23 0403 06/02/23 0440 06/03/23 0413 06/04/23 0413 06/05/23 0423  AST 22 23 31 26 17   ALT 32 32 45* 45* 36  ALKPHOS 105 110 117 113 102  BILITOT 2.7* 1.8* 1.4* 1.2 1.2  PROT 5.9* 6.4* 6.0* 5.7* 5.6*  ALBUMIN 1.6* 1.9* 1.8* 1.8* 1.8*    Recent Labs  Lab 05/31/23 1858  AMMONIA 21   Coagulation Profile: Recent Labs  Lab 05/31/23 1458  INR 1.8*   BNP (last 3 results) Recent Labs    04/12/23 1822 06/01/23 0401  BNP 58.0 118.0*   CBG: Recent Labs  Lab 05/31/23 1820  GLUCAP 133*   Sepsis Labs: Recent Labs  Lab 05/31/23 1458 05/31/23 1648 06/01/23 0401  PROCALCITON  --   --  3.17  LATICACIDVEN 1.5 1.5  --     Recent Results (from the past 240 hours)  Blood Culture (routine x 2)     Status: None   Collection Time: 05/31/23  2:58 PM   Specimen: Right Antecubital; Blood  Result Value Ref Range Status   Specimen Description   Final    RIGHT ANTECUBITAL BOTTLES DRAWN AEROBIC AND ANAEROBIC   Special Requests Blood Culture adequate volume  Final   Culture   Final    NO GROWTH 5 DAYS Performed at [facility], [address] [place], [place] [address]    Report Status 06/05/2023 FINAL  Final  Blood Culture (routine x 2)     Status: None   Collection Time: 05/31/23  2:58 PM   Specimen: BLOOD RIGHT HAND  Result Value Ref Range Status   Specimen Description   Final    BLOOD RIGHT HAND BOTTLES DRAWN AEROBIC AND ANAEROBIC   Special Requests Blood Culture adequate volume  Final   Culture   Final    NO GROWTH 5 DAYS Performed at [facility], [address] [place], [place] [address]    Report Status 06/05/2023 FINAL  Final  Urine Culture      Status: None   Collection Time: 05/31/23  4:18 PM   Specimen: Urine, Random  Result Value Ref Range Status   Specimen Description   Final    URINE, RANDOM Performed at [facility], [address] [place], [place] [address]    Special Requests   Final    NONE Reflexed from [id number] Performed at [facility], [address] [place], [place] [address]    Culture   Final    NO GROWTH Performed at [facility], [address] [place], [place] [address]    Report Status 06/02/2023 FINAL  Final  Resp panel by RT-PCR (RSV, Flu A&B, Covid) Anterior Nasal Swab     Status: Abnormal   Collection Time: 05/31/23  8:07 PM   Specimen: Anterior Nasal Swab  Result Value Ref Range Status   SARS Coronavirus 2 by RT PCR NEGATIVE NEGATIVE Final    Comment: (NOTE) SARS-CoV-2 target nucleic acids are NOT DETECTED.  The SARS-CoV-2 RNA is generally detectable in upper respiratory specimens during the acute phase of infection. The lowest concentration of SARS-CoV-2 viral copies this assay can detect is 138 copies/mL. A negative result does not preclude SARS-Cov-2 infection and should not be used as the sole basis for treatment or other patient management decisions. A negative result may occur with  improper specimen collection/handling, submission of specimen other than nasopharyngeal swab,  presence of viral mutation(s) within the areas targeted by this assay, and inadequate number of viral copies(<138 copies/mL). A negative result must be combined with clinical observations, patient history, and epidemiological information. The expected result is Negative.  Fact Sheet for Patients:  BloggerCourse.com  Fact Sheet for Healthcare Providers:  SeriousBroker.it  This test is no t yet approved or cleared by the [place] FDA and  has been authorized for detection and/or diagnosis of SARS-CoV-2 by FDA under an Emergency Use Authorization  (EUA). This EUA will remain  in effect (meaning this test can be used) for the duration of the COVID-19 declaration under Section 564(b)(1) of the Act, 21 U.S.C.section 360bbb-3(b)(1), unless the authorization is terminated  or revoked sooner.       Influenza A by PCR NEGATIVE NEGATIVE Final   Influenza B by PCR NEGATIVE NEGATIVE Final    Comment: (NOTE) The Xpert Xpress SARS-CoV-2/FLU/RSV plus assay is intended as an aid in the diagnosis of influenza from Nasopharyngeal swab specimens and should not be used as a sole basis for treatment. Nasal washings and aspirates are unacceptable for Xpert Xpress SARS-CoV-2/FLU/RSV testing.  Fact Sheet for Patients: BloggerCourse.com  Fact Sheet for Healthcare Providers: SeriousBroker.it  This test is not yet approved or cleared by the [place] FDA and has been authorized for detection and/or diagnosis of SARS-CoV-2 by FDA under an Emergency Use Authorization (EUA). This EUA will remain in effect (meaning this test can be used) for the duration of the COVID-19 declaration under Section 564(b)(1) of the Act, 21 U.S.C. section 360bbb-3(b)(1), unless the authorization is terminated or revoked.     Resp Syncytial Virus by PCR POSITIVE (A) NEGATIVE Final    Comment: (NOTE) Fact Sheet for Patients: BloggerCourse.com  Fact Sheet for Healthcare Providers: SeriousBroker.it  This test is not yet approved or cleared by the [place] FDA and has been authorized for detection and/or diagnosis of SARS-CoV-2 by FDA under an Emergency Use Authorization (EUA). This EUA will remain in effect (meaning this test can be used) for the duration of the COVID-19 declaration under Section 564(b)(1) of the Act, 21 U.S.C. section 360bbb-3(b)(1), unless the authorization is terminated or revoked.  Performed at [facility], [address]  [place], [place] [address]   MRSA Next Gen by PCR, Nasal     Status: Abnormal   Collection Time: 05/31/23  9:11 PM   Specimen: Nasal Mucosa; Nasal Swab  Result Value Ref Range Status   MRSA by PCR Next Gen DETECTED (A) NOT DETECTED Final    Comment: RESULT CALLED TO, READ BACK BY AND VERIFIED WITH: ESTOCE,R ON 06/02/23 AT 0037 BY PURDIE,J        The GeneXpert MRSA Assay (FDA approved for NASAL specimens only), is one component of a comprehensive MRSA colonization surveillance program. It is not intended to diagnose MRSA infection nor to guide or monitor treatment for MRSA infections. Performed at [facility], [address] [place], [place] [address]     Scheduled Meds:  ascorbic acid  500 mg Oral Daily   docusate sodium  200 mg Oral Daily   feeding supplement  237 mL Oral BID BM   guaiFENesin-dextromethorphan  10 mL Oral Q8H   latanoprost  1 drop Both Eyes QHS   pantoprazole (PROTONIX) IV  40 mg Intravenous Q24H   umeclidinium bromide  1 puff Inhalation Daily   Continuous Infusions:   LOS: 6 days   Time spent: 45 minutes  Carollee Herter, DO  [facility]  06/06/2023, 10:18 AM

## 2023-06-06 NOTE — Consult Note (Signed)
 [facility] Liaison Note  06/06/2023  Randy Russo 1940/07/24 [id number]  Location: RN Hospital Liaison screened the patient remotely at [facility].  Insurance: [employer] HMO   Randy Russo is a 83 y.o. male who is a Primary Care Patient of Assunta Found, MD [facility]. The patient was screened for readmission hospitalization with noted high risk score for unplanned readmission risk with 3 IP in 6 months due to green banner note on chart review.  The patient was assessed for potential Care Management service needs for post hospital transition for care coordination. Review of patient's electronic medical record reveals patient was admitted for Sepsis. Pt will return to [facility] today. No anticipated needs presented at this time. This provider dose not participate in VBCI services at this time.   VBCI Care Management/Population Health does not replace or interfere with any arrangements made by the Inpatient Transition of Care team.   For questions contact:   Elliot Cousin, RN, BSN Hospital Liaison [place]   [facility], Population Health Office Hours MTWF  8:00 am-6:00 pm Direct Dial: [phone] mobile [email or web address]

## 2023-06-06 NOTE — Telephone Encounter (Signed)
 No. We do not need the labs I ordered now that he was readmitted.

## 2023-06-06 NOTE — Telephone Encounter (Signed)
 Pt is currently inpatient and will be discharged today. Brooke Bonito has ordered a cbc to be completed in one week, do you want the iron panel and lft done then as well?

## 2023-06-06 NOTE — Telephone Encounter (Signed)
 Mandy: I did not see the patient during hospitalization, please try and schedule his with one of the other providers listed who did see him during the hospital stay. He had lengthy hospital stay that I am unfamiliar with. Thanks.

## 2023-06-06 NOTE — Progress Notes (Addendum)
 Gastroenterology Progress Note   Referring Provider: No ref. provider found Primary Care Physician:  Assunta Found, MD Primary Gastroenterologist:  Dolores Frame, MD  Patient ID: Randy Russo; [id number]; 16-Jan-1941    Subjective   Feeling well this morning. Denies pain, melena, brbpr, lightheadedness, chest pain, shortness of breath. Is hungry and ready for breakfast.   Breathing is normal and mental status appropriate this morning.   Objective   Vital signs in last 24 hours Temp:  [98.1 F (36.7 C)-98.5 F (36.9 C)] 98.2 F (36.8 C) (03/14 0500) Pulse Rate:  [70-80] 70 (03/14 0500) Resp:  [14-16] 16 (03/14 0500) BP: (103-118)/(60-76) 103/76 (03/14 0500) SpO2:  [95 %-100 %] 100 % (03/14 0500) FiO2 (%):  [99 %] 99 % (03/14 0808) Weight:  [86.2 kg] 86.2 kg (03/13 1315) Last BM Date : 06/05/23  Physical Exam General:   Alert and oriented, pleasant Head:  Normocephalic and atraumatic. Eyes:  No icterus, sclera clear. Conjuctiva pink.  Neck:  Supple, without thyromegaly or masses.  Abdomen:  Bowel sounds present, soft, non-tender, non-distended. No HSM or hernias noted. No rebound or guarding. No masses appreciated  Neurologic:  Alert and  oriented x4;  grossly normal neurologically. Psych:  Alert and cooperative. Normal mood and affect.  Intake/Output from previous day: 03/13 0701 - 03/14 0700 In: 1740 [P.O.:1440; IV Piggyback:300] Out: 1540 [Urine:1500; Stool:40] Intake/Output this shift: No intake/output data recorded.  Lab Results  Recent Labs    06/05/23 0423 06/05/23 1908 06/06/23 0420  WBC 9.9 8.1 7.5  HGB 8.7* 9.3* 8.8*  HCT 29.3* 31.2* 29.6*  PLT 347 367 362   BMET Recent Labs    06/04/23 0413 06/05/23 0423  NA 138 137  K 3.8 3.4*  CL 111 112*  CO2 21* 21*  GLUCOSE 104* 90  BUN 11 8  CREATININE 0.68 0.61  CALCIUM 8.0* 7.8*   LFT Recent Labs    06/04/23 0413 06/05/23 0423  PROT 5.7* 5.6*  ALBUMIN 1.8* 1.8*  AST 26 17   ALT 45* 36  ALKPHOS 113 102  BILITOT 1.2 1.2   PT/INR No results for input(s): "LABPROT", "INR" in the last 72 hours. Hepatitis Panel No results for input(s): "HEPBSAG", "HCVAB", "HEPAIGM", "HEPBIGM" in the last 72 hours.  Studies/Results CT Angio Chest PE W and/or Wo Contrast Result Date: 05/31/2023 CLINICAL DATA:  Sepsis, altered mental status history of prostate cancer * Tracking Code: BO * EXAM: CT ANGIOGRAPHY CHEST CT ABDOMEN AND PELVIS WITH CONTRAST TECHNIQUE: Multidetector CT imaging of the chest was performed using the standard protocol during bolus administration of intravenous contrast. Multiplanar CT image reconstructions and MIPs were obtained to evaluate the vascular anatomy. Multidetector CT imaging of the abdomen and pelvis was performed using the standard protocol during bolus administration of intravenous contrast. RADIATION DOSE REDUCTION: This exam was performed according to the departmental dose-optimization program which includes automated exposure control, adjustment of the mA and/or kV according to patient size and/or use of iterative reconstruction technique. CONTRAST:  OMNIPAQUE IOHEXOL 350 MG/ML SOLN COMPARISON:  CT abdomen pelvis, 04/22/2023 FINDINGS: CT CHEST ANGIOGRAM FINDINGS Cardiovascular: Examination limited by breath motion artifact. Within this limitation, no central or proximal segmental pulmonary embolus. Evaluation of more distal vessels is nondiagnostic. Normal heart size. No pericardial effusion. Mediastinum/Nodes: No enlarged mediastinal, hilar, or axillary lymph nodes. Thyroid gland, trachea, and esophagus demonstrate no significant findings. Lungs/Pleura: Bibasilar scarring or atelectasis. Evaluation of the lung parenchyma significantly limited by breath motion artifact. No pleural effusion  or pneumothorax. Musculoskeletal: No chest wall abnormality. No acute osseous findings. Review of the MIP images confirms the above findings. CT ABDOMEN PELVIS FINDINGS  Evaluation limited by breath motion artifact. Hepatobiliary: No solid liver abnormality is seen. No gallstones, gallbladder wall thickening, or biliary dilatation. Pancreas: Unremarkable. No pancreatic ductal dilatation or surrounding inflammatory changes. Spleen: Normal in size without significant abnormality. Adrenals/Urinary Tract: Adrenal glands are unremarkable. Simple, benign bilateral renal cortical cysts, including a large exophytic cyst arising from the anterior midportion of the right kidney. No obvious calculi. No hydronephrosis. Bladder is unremarkable. Stomach/Bowel: Stomach is within normal limits. Appendix appears normal. No evidence of bowel wall thickening, distention, or inflammatory changes. Descending and sigmoid diverticulosis Vascular/Lymphatic: Previously noted portal venous thrombus not clearly seen on today's examination (series 6, image 30). No enlarged abdominal or pelvic lymph nodes. Reproductive: Biopsy marking clips or fiducials in the expected vicinity of the prostate, assessment of the low pelvis limited by motion and right hip arthroplasty. Other: Midline ventral hernia containing nonobstructed mid small bowel (series 2, image 54). No ascites. Musculoskeletal: Status post right hip total arthroplasty. IMPRESSION: 1. Examination pulmonary embolism significantly limited by breath motion artifact. Within this limitation, no central or proximal segmental pulmonary embolus. 2. No acute CT findings of the chest, abdomen, or pelvis to explain sepsis within significant limitations of breath motion, patient motion, and streak artifact. 3. Descending and sigmoid diverticulosis without evidence of acute diverticulitis. 4. Midline ventral hernia containing nonobstructed mid small bowel. 5. Previously noted portal venous thrombus is not appreciated on today's examination and presumably resolved. Electronically Signed   By: Jearld Lesch M.D.   On: 05/31/2023 19:16   CT ABDOMEN PELVIS W  CONTRAST Result Date: 05/31/2023 CLINICAL DATA:  Sepsis, altered mental status history of prostate cancer * Tracking Code: BO * EXAM: CT ANGIOGRAPHY CHEST CT ABDOMEN AND PELVIS WITH CONTRAST TECHNIQUE: Multidetector CT imaging of the chest was performed using the standard protocol during bolus administration of intravenous contrast. Multiplanar CT image reconstructions and MIPs were obtained to evaluate the vascular anatomy. Multidetector CT imaging of the abdomen and pelvis was performed using the standard protocol during bolus administration of intravenous contrast. RADIATION DOSE REDUCTION: This exam was performed according to the departmental dose-optimization program which includes automated exposure control, adjustment of the mA and/or kV according to patient size and/or use of iterative reconstruction technique. CONTRAST:  OMNIPAQUE IOHEXOL 350 MG/ML SOLN COMPARISON:  CT abdomen pelvis, 04/22/2023 FINDINGS: CT CHEST ANGIOGRAM FINDINGS Cardiovascular: Examination limited by breath motion artifact. Within this limitation, no central or proximal segmental pulmonary embolus. Evaluation of more distal vessels is nondiagnostic. Normal heart size. No pericardial effusion. Mediastinum/Nodes: No enlarged mediastinal, hilar, or axillary lymph nodes. Thyroid gland, trachea, and esophagus demonstrate no significant findings. Lungs/Pleura: Bibasilar scarring or atelectasis. Evaluation of the lung parenchyma significantly limited by breath motion artifact. No pleural effusion or pneumothorax. Musculoskeletal: No chest wall abnormality. No acute osseous findings. Review of the MIP images confirms the above findings. CT ABDOMEN PELVIS FINDINGS Evaluation limited by breath motion artifact. Hepatobiliary: No solid liver abnormality is seen. No gallstones, gallbladder wall thickening, or biliary dilatation. Pancreas: Unremarkable. No pancreatic ductal dilatation or surrounding inflammatory changes. Spleen: Normal in size  without significant abnormality. Adrenals/Urinary Tract: Adrenal glands are unremarkable. Simple, benign bilateral renal cortical cysts, including a large exophytic cyst arising from the anterior midportion of the right kidney. No obvious calculi. No hydronephrosis. Bladder is unremarkable. Stomach/Bowel: Stomach is within normal limits. Appendix appears normal. No  evidence of bowel wall thickening, distention, or inflammatory changes. Descending and sigmoid diverticulosis Vascular/Lymphatic: Previously noted portal venous thrombus not clearly seen on today's examination (series 6, image 30). No enlarged abdominal or pelvic lymph nodes. Reproductive: Biopsy marking clips or fiducials in the expected vicinity of the prostate, assessment of the low pelvis limited by motion and right hip arthroplasty. Other: Midline ventral hernia containing nonobstructed mid small bowel (series 2, image 54). No ascites. Musculoskeletal: Status post right hip total arthroplasty. IMPRESSION: 1. Examination pulmonary embolism significantly limited by breath motion artifact. Within this limitation, no central or proximal segmental pulmonary embolus. 2. No acute CT findings of the chest, abdomen, or pelvis to explain sepsis within significant limitations of breath motion, patient motion, and streak artifact. 3. Descending and sigmoid diverticulosis without evidence of acute diverticulitis. 4. Midline ventral hernia containing nonobstructed mid small bowel. 5. Previously noted portal venous thrombus is not appreciated on today's examination and presumably resolved. Electronically Signed   By: Jearld Lesch M.D.   On: 05/31/2023 19:16   DG Chest Port 1 View Result Date: 05/31/2023 CLINICAL DATA:  Possible sepsis. EXAM: PORTABLE CHEST 1 VIEW COMPARISON:  April 22, 2023. FINDINGS: Stable cardiomediastinal silhouette. Both lungs are clear. The visualized skeletal structures are unremarkable. IMPRESSION: No active disease. Electronically  Signed   By: Lupita Raider M.D.   On: 05/31/2023 15:42    Assessment  83 y.o. 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  GI consulted for further evaluation of anemia despite heme-negative stool.  Acute on chronic anemia: -Continues without any overt bleeding. 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 Johnnette Litter.  Dr. Tasia Catchings had lengthy discussion with patient's granddaughter Randy Russo who agreed that given he is on treat the treatable hospice care and that he has continued to be without any overt bleeding that they would like to hold on procedures for now given his high risk nature and only perform if overt bleeding identified.   He was made n.p.o. overnight just in case large drop in hemoglobin or any overt bleeding occurred to be able to perform procedures today.  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     LOS: 6 days    06/06/2023, 8:45 AM   Brooke Bonito, MSN, FNP-BC, AGACNP-BC [facility]

## 2023-06-06 NOTE — Telephone Encounter (Signed)
 Noted.

## 2023-06-06 NOTE — TOC Transition Note (Addendum)
 Transition of Care [facility]) - Discharge Note   Patient Details  Name: Randy Russo MRN: [id number] Date of Birth: 06/15/1940  [facility]) CM/SW Contact:  Isabella Bowens, LCSWA Phone Number: 06/06/2023, 10:31 AM   Clinical Narrative:    Patient is scheduled to DC back to [facility] this morning. CSW spoke with Eunice Blase and was provided with room number ( C29-1) and call report number [phone]. Rinaldo Cloud , granddaughter was also made aware of patient DC back to [facility]. Nurse updated to call report . Med necessity form completed.   EMS called.  Final next level of care: Skilled Nursing Facility Barriers to Discharge: Barriers Resolved   Patient Goals and CMS Choice Patient states their goals for this hospitalization and ongoing recovery are:: return back to [facility] CMS Medicare.gov Compare Post Acute Care list provided to:: Patient Represenative (must comment) Rinaldo Cloud - granddaughter) Choice offered to / list presented to :  Rinaldo Cloud- Granddaughter)      Discharge Placement              Patient chooses bed at:  [facility]) Patient to be transferred to facility by: Ambulance Name of family member notified: Rinaldo Cloud - Granddaughter Patient and family notified of of transfer: 06/06/23  Discharge Plan and Services Additional resources added to the After Visit Summary for                                       Social Drivers of Health (SDOH) Interventions SDOH Screenings   Food Insecurity: No Food Insecurity (05/31/2023)  Recent Concern: Food Insecurity - Food Insecurity Present (04/22/2023)  Housing: Low Risk  (05/31/2023)  Recent Concern: Housing - High Risk (04/13/2023)  Transportation Needs: No Transportation Needs (05/31/2023)  Utilities: Not At Risk (05/31/2023)  Financial Resource Strain: Low Risk  (11/30/2022)   Received from [facility]  Physical Activity: Unknown (12/27/2017)  Social Connections: Patient Unable To Answer (05/31/2023)  Tobacco Use: Medium Risk  (06/05/2023)  Health Literacy: Medium Risk (11/30/2022)   Received from [facility]     Readmission Risk Interventions    06/06/2023   10:24 AM 06/04/2023   11:51 AM 06/02/2023    2:28 PM  Readmission Risk Prevention Plan  Transportation Screening Complete Complete Complete  HRI or Home Care Consult Complete Complete Complete  Social Work Consult for Recovery Care Planning/Counseling Complete Complete Complete  Palliative Care Screening Complete Complete   Medication Review Oceanographer) Complete Complete Complete

## 2023-06-06 NOTE — Discharge Summary (Addendum)
 [facility] Phys[facility] Discharge Summary   Patient name: Randy Russo  Admit date:     05/31/2023  Discharge date: 06/06/2023  Attending Physician: Levie Heritage [[id number]]  Discharge Physician: Carollee Herter   PCP: Assunta Found, MD  Admitted From: SNF [facility] Disposition:   [facility]  Recommendations for Outpatient Follow-up:  Follow up with PCP in 1-2 weeks Repeat CBC in 1 week. Forward results to [facility]  Home Health:No Equipment/Devices: None    Discharge Condition:Stable CODE STATUS:DNR/DNI Diet recommendation: Heart Healthy Fluid Restriction: None  Hospital Summary: HPI:  Randy Russo is a 83 y.o. 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  Patient unable to give history due to encephalopathy.  Patient is a resident of a skilled nursing facility he was brought here due to "his sepsis labs being abnormal".  Here, the patient was initially fairly lucid.  UA showed a bladder infection, he was started on Rocephin.  CBC showed anemia but his rectal exam was negative.  Shortly after his labs returned, he had an abrupt decline and became encephalopathic.  He also became tachycardic and tachypneic and quite agitated.  He was given ketamine with good effect.  CT of chest and CT of abdomen pelvis were done and were negative for acute infection.  Due to his anemia, a blood transfusion was started.   Significant Events: Admitted 05/31/2023 for anemia   Significant Labs: Lactic acid 1.5 Na 136, K 3.2, CO2 of 23, BUN 21, Scr 0.78, glu 115 WBC 24.8, HgB 6.1, plt 478 INR 1.8 Hemoccult Negative Procalcitonin 3.17 BNP 118 Fe 24, TIBC 115, %sat 21 B12 1055 RBC folate 10.4  Significant Imaging Studies: CXR No active disease  CTPA/CT abd/pelvis Examination  pulmonary embolism significantly limited by breath  motion artifact. Within this limitation, no central or proximal segmental pulmonary embolus. 2. No acute CT findings of the chest,  abdomen, or pelvis to explain sepsis within significant limitations of breath motion, patient motion, and streak artifact. 3. Descending and sigmoid diverticulosis without evidence of acute diverticulitis. 4. Midline ventral hernia containing nonobstructed mid small bowel. 5. Previously noted portal venous thrombus is not appreciated on today's examination and presumably resolved   Antibiotic Therapy: Anti-infectives (From admission, onward)    Start     Dose/Rate Route Frequency Ordered Stop   06/01/23 1830  vancomycin (VANCOREADY) IVPB 1750 mg/350 mL  Status:  Discontinued        1,750 mg 175 mL/hr over 120 Minutes Intravenous Every 24 hours 05/31/23 1839 06/03/23 1012   06/01/23 1800  cefTRIAXone (ROCEPHIN) 2 g in sodium chloride 0.9 % 100 mL IVPB  Status:  Discontinued        2 g 200 mL/hr over 30 Minutes Intravenous Every 24 hours 05/31/23 2226 06/01/23 0719   06/01/23 1400  piperacillin-tazobactam (ZOSYN) IVPB 3.375 g        3.375 g 12.5 mL/hr over 240 Minutes Intravenous Every 8 hours 06/01/23 1113     06/01/23 0830  piperacillin-tazobactam (ZOSYN) IVPB 3.375 g        3.375 g 100 mL/hr over 30 Minutes Intravenous  Once 06/01/23 0742 06/01/23 0834   05/31/23 1845  metroNIDAZOLE (FLAGYL) IVPB 500 mg  Status:  Discontinued        500 mg 100 mL/hr over 60 Minutes Intravenous 2 times daily 05/31/23 1830 06/01/23 0742  05/31/23 1845  vancomycin (VANCOREADY) IVPB 1750 mg/350 mL        1,750 mg 175 mL/hr over 120 Minutes Intravenous  Once 05/31/23 1834 05/31/23 2151   05/31/23 1600  cefTRIAXone (ROCEPHIN) 2 g in sodium chloride 0.9 % 100 mL IVPB        2 g 200 mL/hr over 30 Minutes Intravenous Once 05/31/23 1549 05/31/23 1647       Procedures:   Consultants: GI Palliative care   Hospital Course by  Problem: * Sepsis secondary to UTI [facility]) On admission. Patient met sepsis criteria on admission-cephalopathic, tachypneic, tachycardic, WBC 24.8  >> 28.1, lactic acid 1.5, -Still meeting sepsis criteria, tachypneic, tachycardic, blood pressure soft. -Likely source urinary-UTI and RSV infection. -Will follow-up with blood and urine cultures. -Continue on broad-spectrum antibiotics: Zosyn, Flagyl, vancomycin for now with a quick de-escalation 24 hours, based on cultures. -Sepsis protocol, s/p IV hydration -continue maintenance IV fluid with LR.  06-02-2023 to 06-03-2023 see Dr. Windell Norfolk documentation  06-04-2023  urine cx negative. Has had 72 hours of IV zosyn. Should be sufficient for culture negative UTI.  06-05-2023 resolved.  RSV infection On admission. - Continue supportive care. -Supplemental oxygen as needed. -Continue as needed Tylenol, mucolytics. -IV steroids with quick taper  06-02-2023 to 06-03-2023 see Dr. Windell Norfolk documentation   06-04-2023 on RA. Off steroids.  06-05-2023 resolved. On RA and off steroids.  Acute on chronic anemia On admission. - Anemia of chronic disease, on Eliquis. - POA: Globin down from 10.8  >> 6.1. -Hemoccult negative. -Overnight 06/01/2023 was transfused with 2U PRBCs. -Trend H&H closely, transfuse as needed     Latest Ref Rng & Units 06/01/2023    4:03 AM 05/31/2023    2:58 PM 05/08/2023   10:12 AM  CBC  WBC 4.0 - 10.5 K/uL 28.1  24.8  5.8   Hemoglobin 13.0 - 17.0 g/dL 7.5  6.1  40.9   Hematocrit 39.0 - 52.0 % 24.4  20.2  35.5   Platelets 150 - 400 K/uL 416  478  365    06-02-2023 to 06-03-2023 see Dr. Windell Norfolk documentation   06-04-2023 GI consulted on 06-02-2023. GI trying to decide if they are going to proceed with EGD/colonoscopy. Eliquis has been held. Last dose 06-03-2023 @ 0916  06-05-2023 for EGD/colonoscopy today. Hopefully DC to SNF tomorrow.  06-06-2023 EGD/Colonoscopy canceled yesterday by GI and anesthesia. They did not feel the  benefits of procedure outweighed risks. They felt that risks were too great given stable HgB and no evidence of bleeding.   DC to home. He can f/u with outpatient GI if family want to pursue outpatient endoscopy. DC hgB of 8.8.  will need repeat HgB in 1 week at SNF. Forward results to [facility].  Portal vein thrombosis On admission. - Continue Eliquis. -COVID-negative  06-02-2023 to 06-03-2023 see Dr. Windell Norfolk documentation   06-04-2023 off Eliquis for possible EGD/colonoscopy.  On admission, prior portal vein thrombosis has resolved. Can stop Eliquis at discharge.  06-06-2023 resolved. Will stop Eliquis at discharge.  Chronic anticoagulation 06-04-2023 portal vein thrombosis has resolved. Can stop eliquis at discharge.  Essential hypertension On admission. - Hypertensive, with holding home BP meds. Ending IV fluid hydration  06-02-2023 to 06-03-2023 see Dr. Windell Norfolk documentation   06-04-2023 stable. BP meds on hold due to borderline low BP.  06-05-2023 Stable. Not on any HTN meds for now.  COPD (chronic obstructive pulmonary disease) (HCC) On admission. Upper respiratory symptoms likely due to RSV. -Wheezing, with rhonchi, -  Currently on room air, satting 91%. -Continue O2 supplement via nasal cannula, maintaining O2 sat greater than 92%. -As needed DuoNeb bronchodilator treatment, as needed mucolytics  06-02-2023 to 06-03-2023 see Dr. Windell Norfolk documentation   06-04-2023 on RA. No wheezing. Continue inhalers and prn nebs.  06-05-2023 chronic. Stable.  DNR (do not resuscitate)/DNI(Do Not Intubate) Made DNR/DNI on admission.    Pressure injury of skin-resolved as of 06/04/2023 - Continue wound care per RN-consult placed -Rotating patient every 2 hours,    Discharge Diagnoses:  Principal Problem:   Sepsis secondary to UTI [facility]) Active Problems:   Acute on chronic anemia   RSV infection   Portal vein thrombosis   COPD (chronic obstructive pulmonary disease) (HCC)    Essential hypertension   Chronic anticoagulation   DNR (do not resuscitate)/DNI(Do Not Intubate)   Discharge Instructions  Discharge Instructions     Call MD for:  difficulty breathing, headache or visual disturbances   Complete by: As directed    Call MD for:  extreme fatigue   Complete by: As directed    Call MD for:  hives   Complete by: As directed    Call MD for:  persistant dizziness or light-headedness   Complete by: As directed    Call MD for:  persistant nausea and vomiting   Complete by: As directed    Call MD for:  redness, tenderness, or signs of infection (pain, swelling, redness, odor or green/yellow discharge around incision site)   Complete by: As directed    Call MD for:  severe uncontrolled pain   Complete by: As directed    Call MD for:  temperature >100.4   Complete by: As directed    Diet - low sodium heart healthy   Complete by: As directed    Discharge instructions   Complete by: As directed    1. Repeat CBC on 06-12-2023. Forward results to [facility] 2. Follow up with your primary care provider in 1-2 weeks following discharge from hospital.   Discharge wound care:   Complete by: As directed    1.   Pressure Injury 11/01/21 Rectum Right;Lower Stage 2 -  Partial thickness loss of dermis presenting as a shallow open injury with a red, pink wound bed without slough.  1. Clean wound bed with warm water and soap once a day. Pat rub. Do NOT rub dry. 2. Apply bactroban ointment to wound bed. Cover with non-adherent dressing like Xeroform and vaseline gauze.  Cover with dry dressing. 3. Change dressing at least once a day or when soiled.   Increase activity slowly   Complete by: As directed       Allergies as of 06/06/2023   No Known Allergies      Medication List     STOP taking these medications    apixaban 5 MG Tabs tablet Commonly known as: ELIQUIS   nitrofurantoin (macrocrystal-monohydrate) 100 MG  capsule Commonly known as: MACROBID   sodium chloride 0.9 % infusion       TAKE these medications    acetaminophen 325 MG tablet Commonly known as: TYLENOL Take 650 mg by mouth every 6 (six) hours as needed for mild pain (pain score 1-3). Do not exceed 3,000 mg in 24 hours   albuterol (2.5 MG/3ML) 0.083% nebulizer solution Commonly known as: PROVENTIL Take 3 mLs (2.5 mg total) by nebulization every 6 (six) hours as needed for wheezing or shortness of breath.   ascorbic acid 500 MG tablet Commonly known as:  VITAMIN C Take 1 tablet (500 mg total) by mouth daily. Start taking on: June 07, 2023   B-complex with vitamin C tablet Take 1 tablet by mouth daily.   Cholecalciferol 25 MCG (1000 UT) capsule Take 1 capsule (1,000 Units total) by mouth daily.   docusate sodium 100 MG capsule Commonly known as: COLACE Take 2 capsules (200 mg total) by mouth daily.   ferrous sulfate 325 (65 FE) MG tablet Take 1 tablet (325 mg total) by mouth 2 (two) times daily with a meal.   furosemide 20 MG tablet Commonly known as: LASIX Take 20 mg by mouth daily.   latanoprost 0.005 % ophthalmic solution Commonly known as: XALATAN Place 1 drop into both eyes at bedtime.   loratadine 10 MG tablet Commonly known as: CLARITIN Take 1 tablet (10 mg total) by mouth daily.   mupirocin ointment 2 % Commonly known as: BACTROBAN Apply 1 Application topically daily.   Nutritional Supplement Plus Liqd Take 120 mLs by mouth daily.   Spiriva Respimat 2.5 MCG/ACT Aers Generic drug: Tiotropium Bromide Monohydrate Inhale 2 puffs into the lungs daily.               Discharge Care Instructions  (From admission, onward)           Start     Ordered   06/06/23 0000  Discharge wound care:       Comments: 1.   06/06/23 1011            No Known Allergies  Discharge Exam: Vitals:   06/05/23 2300 06/06/23 0500  BP: 118/60 103/76  Pulse: 80 70  Resp: 14 16  Temp: 98.5 F (36.9  C) 98.2 F (36.8 C)  SpO2: 100% 100%    Physical Exam Vitals and nursing note reviewed.  Constitutional:      General: He is not in acute distress.    Appearance: He is not toxic-appearing or diaphoretic.  HENT:     Head: Normocephalic and atraumatic.     Nose: Nose normal.  Eyes:     General: No scleral icterus. Cardiovascular:     Rate and Rhythm: Normal rate and regular rhythm.  Pulmonary:     Effort: Pulmonary effort is normal. No respiratory distress.     Breath sounds: Normal breath sounds.  Abdominal:     General: Bowel sounds are normal. There is no distension.     Palpations: Abdomen is soft.  Musculoskeletal:     Right lower leg: No edema.     Left lower leg: No edema.  Skin:    General: Skin is warm and dry.     Capillary Refill: Capillary refill takes less than 2 seconds.  Neurological:     Mental Status: He is alert.     Comments: Mild dysarthria. Edentulous. Makes him hard to understand.     The results of significant diagnostics from this hospitalization (including imaging, microbiology, ancillary and laboratory) are listed below for reference.    Microbiology: Recent Results (from the past 240 hours)  Blood Culture (routine x 2)     Status: None   Collection Time: 05/31/23  2:58 PM   Specimen: Right Antecubital; Blood  Result Value Ref Range Status   Specimen Description   Final    RIGHT ANTECUBITAL BOTTLES DRAWN AEROBIC AND ANAEROBIC   Special Requests Blood Culture adequate volume  Final   Culture   Final    NO GROWTH 5 DAYS Performed at [facility], [address], [place], [place] [address]    Report Status 06/05/2023 FINAL  Final  Blood Culture (routine x 2)     Status: None   Collection Time: 05/31/23  2:58 PM   Specimen: BLOOD RIGHT HAND  Result Value Ref Range Status   Specimen Description   Final    BLOOD RIGHT HAND BOTTLES DRAWN AEROBIC AND ANAEROBIC   Special Requests Blood Culture adequate volume  Final   Culture   Final    NO  GROWTH 5 DAYS Performed at [facility], [address], [place], [place] [address]    Report Status 06/05/2023 FINAL  Final  Urine Culture     Status: None   Collection Time: 05/31/23  4:18 PM   Specimen: Urine, Random  Result Value Ref Range Status   Specimen Description   Final    URINE, RANDOM Performed at [facility], [address] [place], [place] [address]    Special Requests   Final    NONE Reflexed from [id number] Performed at [facility], [address], [place], [place] [address]    Culture   Final    NO GROWTH Performed at [facility], [address], [place], [place] [address]    Report Status 06/02/2023 FINAL  Final  Resp panel by RT-PCR (RSV, Flu A&B, Covid) Anterior Nasal Swab     Status: Abnormal   Collection Time: 05/31/23  8:07 PM   Specimen: Anterior Nasal Swab  Result Value Ref Range Status   SARS Coronavirus 2 by RT PCR NEGATIVE NEGATIVE Final    Comment: (NOTE) SARS-CoV-2 target nucleic acids are NOT DETECTED.  The SARS-CoV-2 RNA is generally detectable in upper respiratory specimens during the acute phase of infection. The lowest concentration of SARS-CoV-2 viral copies this assay can detect is 138 copies/mL. A negative result does not preclude SARS-Cov-2 infection and should not be used as the sole basis for treatment or other patient management decisions. A negative result may occur with  improper specimen collection/handling, submission of specimen other than nasopharyngeal swab, presence of viral mutation(s) within the areas targeted by this assay, and inadequate number of viral copies(<138 copies/mL). A negative result must be combined with clinical observations, patient history, and epidemiological information. The expected result is Negative.  Fact Sheet for Patients:  BloggerCourse.com  Fact Sheet for Healthcare Providers:  SeriousBroker.it  This test is no t yet approved or cleared by  the [place] FDA and  has been authorized for detection and/or diagnosis of SARS-CoV-2 by FDA under an Emergency Use Authorization (EUA). This EUA will remain  in effect (meaning this test can be used) for the duration of the COVID-19 declaration under Section 564(b)(1) of the Act, 21 U.S.C.section 360bbb-3(b)(1), unless the authorization is terminated  or revoked sooner.       Influenza A by PCR NEGATIVE NEGATIVE Final   Influenza B by PCR NEGATIVE NEGATIVE Final    Comment: (NOTE) The Xpert Xpress SARS-CoV-2/FLU/RSV plus assay is intended as an aid in the diagnosis of influenza from Nasopharyngeal swab specimens and should not be used as a sole basis for treatment. Nasal washings and aspirates are unacceptable for Xpert Xpress SARS-CoV-2/FLU/RSV testing.  Fact Sheet for Patients: BloggerCourse.com  Fact Sheet for Healthcare Providers: SeriousBroker.it  This test is not yet approved or cleared by the [place] FDA and has been authorized for detection and/or diagnosis of SARS-CoV-2 by FDA under an Emergency Use Authorization (EUA). This EUA will remain in effect (meaning this test can  be used) for the duration of the COVID-19 declaration under Section 564(b)(1) of the Act, 21 U.S.C. section 360bbb-3(b)(1), unless the authorization is terminated or revoked.     Resp Syncytial Virus by PCR POSITIVE (A) NEGATIVE Final    Comment: (NOTE) Fact Sheet for Patients: BloggerCourse.com  Fact Sheet for Healthcare Providers: SeriousBroker.it  This test is not yet approved or cleared by the [place] FDA and has been authorized for detection and/or diagnosis of SARS-CoV-2 by FDA under an Emergency Use Authorization (EUA). This EUA will remain in effect (meaning this test can be used) for the duration of the COVID-19 declaration under Section 564(b)(1) of the Act, 21  U.S.C. section 360bbb-3(b)(1), unless the authorization is terminated or revoked.  Performed at [facility], [address], [place], [place] [address]   MRSA Next Gen by PCR, Nasal     Status: Abnormal   Collection Time: 05/31/23  9:11 PM   Specimen: Nasal Mucosa; Nasal Swab  Result Value Ref Range Status   MRSA by PCR Next Gen DETECTED (A) NOT DETECTED Final    Comment: RESULT CALLED TO, READ BACK BY AND VERIFIED WITH: ESTOCE,R ON 06/02/23 AT 0037 BY PURDIE,J        The GeneXpert MRSA Assay (FDA approved for NASAL specimens only), is one component of a comprehensive MRSA colonization surveillance program. It is not intended to diagnose MRSA infection nor to guide or monitor treatment for MRSA infections. Performed at [facility], [address], [place], [place] [address]      Labs: BNP (last 3 results) Recent Labs    04/12/23 1822 06/01/23 0401  BNP 58.0 118.0*   Basic Metabolic Panel: Recent Labs  Lab 06/01/23 0403 06/02/23 0440 06/03/23 0413 06/04/23 0413 06/05/23 0423  NA 138 140 138 138 137  K 3.4* 4.1 3.6 3.8 3.4*  CL 114* 112* 110 111 112*  CO2 20* 21* 22 21* 21*  GLUCOSE 141* 121* 93 104* 90  BUN 15 17 13 11 8   CREATININE 0.57* 0.68 0.67 0.68 0.61  CALCIUM 7.2* 8.3* 8.1* 8.0* 7.8*  MG 1.9  --   --   --   --   PHOS 3.6  --   --   --   --    Liver Function Tests: Recent Labs  Lab 06/01/23 0403 06/02/23 0440 06/03/23 0413 06/04/23 0413 06/05/23 0423  AST 22 23 31 26 17   ALT 32 32 45* 45* 36  ALKPHOS 105 110 117 113 102  BILITOT 2.7* 1.8* 1.4* 1.2 1.2  PROT 5.9* 6.4* 6.0* 5.7* 5.6*  ALBUMIN 1.6* 1.9* 1.8* 1.8* 1.8*    Recent Labs  Lab 05/31/23 1858  AMMONIA 21   CBC: Recent Labs  Lab 06/02/23 0440 06/03/23 0413 06/04/23 0413 06/05/23 0423 06/05/23 1908 06/06/23 0420  WBC 26.1* 23.0* 13.5* 9.9 8.1 7.5  NEUTROABS 24.0* 18.4* 10.8* 5.9 5.9  --   HGB 8.1* 8.7* 8.6* 8.7* 9.3* 8.8*  HCT 27.2* 28.8* 29.5* 29.3* 31.2* 29.6*  MCV  94.1 94.4 96.1 95.4 96.3 95.5  PLT 446* 458* 381 347 367 362   BNP: Recent Labs  Lab 06/01/23 0401  BNP 118.0*   CBG: Recent Labs  Lab 05/31/23 1820  GLUCAP 133*   Urinalysis    Component Value Date/Time   COLORURINE AMBER (A) 05/31/2023 1618   APPEARANCEUR HAZY (A) 05/31/2023 1618   LABSPEC 1.019 05/31/2023 1618   PHURINE 5.0 05/31/2023 1618   GLUCOSEU NEGATIVE 05/31/2023 1618   HGBUR NEGATIVE 05/31/2023  1618   BILIRUBINUR SMALL (A) 05/31/2023 1618   KETONESUR NEGATIVE 05/31/2023 1618   PROTEINUR NEGATIVE 05/31/2023 1618   NITRITE NEGATIVE 05/31/2023 1618   LEUKOCYTESUR MODERATE (A) 05/31/2023 1618   Sepsis Labs Recent Labs  Lab 06/01/23 0401 06/01/23 0403 06/04/23 0413 06/05/23 0423 06/05/23 1908 06/06/23 0420  PROCALCITON 3.17  --   --   --   --   --   WBC  --    < > 13.5* 9.9 8.1 7.5   < > = values in this interval not displayed.    Procedures/Studies: CT Angio Chest PE W and/or Wo Contrast Result Date: 05/31/2023 CLINICAL DATA:  Sepsis, altered mental status history of prostate cancer * Tracking Code: BO * EXAM: CT ANGIOGRAPHY CHEST CT ABDOMEN AND PELVIS WITH CONTRAST TECHNIQUE: Multidetector CT imaging of the chest was performed using the standard protocol during bolus administration of intravenous contrast. Multiplanar CT image reconstructions and MIPs were obtained to evaluate the vascular anatomy. Multidetector CT imaging of the abdomen and pelvis was performed using the standard protocol during bolus administration of intravenous contrast. RADIATION DOSE REDUCTION: This exam was performed according to the departmental dose-optimization program which includes automated exposure control, adjustment of the mA and/or kV according to patient size and/or use of iterative reconstruction technique. CONTRAST:  OMNIPAQUE IOHEXOL 350 MG/ML SOLN COMPARISON:  CT abdomen pelvis, 04/22/2023 FINDINGS: CT CHEST ANGIOGRAM FINDINGS Cardiovascular: Examination limited by  breath motion artifact. Within this limitation, no central or proximal segmental pulmonary embolus. Evaluation of more distal vessels is nondiagnostic. Normal heart size. No pericardial effusion. Mediastinum/Nodes: No enlarged mediastinal, hilar, or axillary lymph nodes. Thyroid gland, trachea, and esophagus demonstrate no significant findings. Lungs/Pleura: Bibasilar scarring or atelectasis. Evaluation of the lung parenchyma significantly limited by breath motion artifact. No pleural effusion or pneumothorax. Musculoskeletal: No chest wall abnormality. No acute osseous findings. Review of the MIP images confirms the above findings. CT ABDOMEN PELVIS FINDINGS Evaluation limited by breath motion artifact. Hepatobiliary: No solid liver abnormality is seen. No gallstones, gallbladder wall thickening, or biliary dilatation. Pancreas: Unremarkable. No pancreatic ductal dilatation or surrounding inflammatory changes. Spleen: Normal in size without significant abnormality. Adrenals/Urinary Tract: Adrenal glands are unremarkable. Simple, benign bilateral renal cortical cysts, including a large exophytic cyst arising from the anterior midportion of the right kidney. No obvious calculi. No hydronephrosis. Bladder is unremarkable. Stomach/Bowel: Stomach is within normal limits. Appendix appears normal. No evidence of bowel wall thickening, distention, or inflammatory changes. Descending and sigmoid diverticulosis Vascular/Lymphatic: Previously noted portal venous thrombus not clearly seen on today's examination (series 6, image 30). No enlarged abdominal or pelvic lymph nodes. Reproductive: Biopsy marking clips or fiducials in the expected vicinity of the prostate, assessment of the low pelvis limited by motion and right hip arthroplasty. Other: Midline ventral hernia containing nonobstructed mid small bowel (series 2, image 54). No ascites. Musculoskeletal: Status post right hip total arthroplasty. IMPRESSION: 1. Examination  pulmonary embolism significantly limited by breath motion artifact. Within this limitation, no central or proximal segmental pulmonary embolus. 2. No acute CT findings of the chest, abdomen, or pelvis to explain sepsis within significant limitations of breath motion, patient motion, and streak artifact. 3. Descending and sigmoid diverticulosis without evidence of acute diverticulitis. 4. Midline ventral hernia containing nonobstructed mid small bowel. 5. Previously noted portal venous thrombus is not appreciated on today's examination and presumably resolved. Electronically Signed   By: Jearld Lesch M.D.   On: 05/31/2023 19:16   CT ABDOMEN PELVIS W CONTRAST  Result Date: 05/31/2023 CLINICAL DATA:  Sepsis, altered mental status history of prostate cancer * Tracking Code: BO * EXAM: CT ANGIOGRAPHY CHEST CT ABDOMEN AND PELVIS WITH CONTRAST TECHNIQUE: Multidetector CT imaging of the chest was performed using the standard protocol during bolus administration of intravenous contrast. Multiplanar CT image reconstructions and MIPs were obtained to evaluate the vascular anatomy. Multidetector CT imaging of the abdomen and pelvis was performed using the standard protocol during bolus administration of intravenous contrast. RADIATION DOSE REDUCTION: This exam was performed according to the departmental dose-optimization program which includes automated exposure control, adjustment of the mA and/or kV according to patient size and/or use of iterative reconstruction technique. CONTRAST:  OMNIPAQUE IOHEXOL 350 MG/ML SOLN COMPARISON:  CT abdomen pelvis, 04/22/2023 FINDINGS: CT CHEST ANGIOGRAM FINDINGS Cardiovascular: Examination limited by breath motion artifact. Within this limitation, no central or proximal segmental pulmonary embolus. Evaluation of more distal vessels is nondiagnostic. Normal heart size. No pericardial effusion. Mediastinum/Nodes: No enlarged mediastinal, hilar, or axillary lymph nodes. Thyroid gland,  trachea, and esophagus demonstrate no significant findings. Lungs/Pleura: Bibasilar scarring or atelectasis. Evaluation of the lung parenchyma significantly limited by breath motion artifact. No pleural effusion or pneumothorax. Musculoskeletal: No chest wall abnormality. No acute osseous findings. Review of the MIP images confirms the above findings. CT ABDOMEN PELVIS FINDINGS Evaluation limited by breath motion artifact. Hepatobiliary: No solid liver abnormality is seen. No gallstones, gallbladder wall thickening, or biliary dilatation. Pancreas: Unremarkable. No pancreatic ductal dilatation or surrounding inflammatory changes. Spleen: Normal in size without significant abnormality. Adrenals/Urinary Tract: Adrenal glands are unremarkable. Simple, benign bilateral renal cortical cysts, including a large exophytic cyst arising from the anterior midportion of the right kidney. No obvious calculi. No hydronephrosis. Bladder is unremarkable. Stomach/Bowel: Stomach is within normal limits. Appendix appears normal. No evidence of bowel wall thickening, distention, or inflammatory changes. Descending and sigmoid diverticulosis Vascular/Lymphatic: Previously noted portal venous thrombus not clearly seen on today's examination (series 6, image 30). No enlarged abdominal or pelvic lymph nodes. Reproductive: Biopsy marking clips or fiducials in the expected vicinity of the prostate, assessment of the low pelvis limited by motion and right hip arthroplasty. Other: Midline ventral hernia containing nonobstructed mid small bowel (series 2, image 54). No ascites. Musculoskeletal: Status post right hip total arthroplasty. IMPRESSION: 1. Examination pulmonary embolism significantly limited by breath motion artifact. Within this limitation, no central or proximal segmental pulmonary embolus. 2. No acute CT findings of the chest, abdomen, or pelvis to explain sepsis within significant limitations of breath motion, patient motion, and  streak artifact. 3. Descending and sigmoid diverticulosis without evidence of acute diverticulitis. 4. Midline ventral hernia containing nonobstructed mid small bowel. 5. Previously noted portal venous thrombus is not appreciated on today's examination and presumably resolved. Electronically Signed   By: Jearld Lesch M.D.   On: 05/31/2023 19:16   DG Chest Port 1 View Result Date: 05/31/2023 CLINICAL DATA:  Possible sepsis. EXAM: PORTABLE CHEST 1 VIEW COMPARISON:  April 22, 2023. FINDINGS: Stable cardiomediastinal silhouette. Both lungs are clear. The visualized skeletal structures are unremarkable. IMPRESSION: No active disease. Electronically Signed   By: Lupita Raider M.D.   On: 05/31/2023 15:42    Time coordinating discharge: 45 mins  SIGNED:  Carollee Herter, DO [facility] 06/06/23, 10:18 AM

## 2023-06-06 NOTE — Progress Notes (Signed)
 EMS at bedside to transport patient back to [facility]. Attempted to call report again, nurse does not answer the phone.

## 2023-06-09 DIAGNOSIS — N390 Urinary tract infection, site not specified: Secondary | ICD-10-CM | POA: Diagnosis not present

## 2023-06-09 DIAGNOSIS — F4322 Adjustment disorder with anxiety: Secondary | ICD-10-CM | POA: Diagnosis not present

## 2023-06-09 DIAGNOSIS — R262 Difficulty in walking, not elsewhere classified: Secondary | ICD-10-CM | POA: Diagnosis not present

## 2023-06-09 DIAGNOSIS — M6281 Muscle weakness (generalized): Secondary | ICD-10-CM | POA: Diagnosis not present

## 2023-06-09 DIAGNOSIS — E559 Vitamin D deficiency, unspecified: Secondary | ICD-10-CM | POA: Diagnosis not present

## 2023-06-09 DIAGNOSIS — H402230 Chronic angle-closure glaucoma, bilateral, stage unspecified: Secondary | ICD-10-CM | POA: Diagnosis not present

## 2023-06-10 NOTE — Telephone Encounter (Signed)
 Please contact the facility and arrange a hospital follow up.

## 2023-06-11 DIAGNOSIS — H402230 Chronic angle-closure glaucoma, bilateral, stage unspecified: Secondary | ICD-10-CM | POA: Diagnosis not present

## 2023-06-11 DIAGNOSIS — E559 Vitamin D deficiency, unspecified: Secondary | ICD-10-CM | POA: Diagnosis not present

## 2023-06-11 DIAGNOSIS — D649 Anemia, unspecified: Secondary | ICD-10-CM | POA: Diagnosis not present

## 2023-06-11 DIAGNOSIS — N390 Urinary tract infection, site not specified: Secondary | ICD-10-CM | POA: Diagnosis not present

## 2023-06-11 DIAGNOSIS — R262 Difficulty in walking, not elsewhere classified: Secondary | ICD-10-CM | POA: Diagnosis not present

## 2023-06-11 DIAGNOSIS — M6281 Muscle weakness (generalized): Secondary | ICD-10-CM | POA: Diagnosis not present

## 2023-06-11 NOTE — Telephone Encounter (Signed)
 Faxed over lab order and appointment details to SNF.

## 2023-06-12 ENCOUNTER — Other Ambulatory Visit: Payer: Medicare HMO

## 2023-06-12 DIAGNOSIS — I10 Essential (primary) hypertension: Secondary | ICD-10-CM | POA: Diagnosis not present

## 2023-06-13 DIAGNOSIS — H402230 Chronic angle-closure glaucoma, bilateral, stage unspecified: Secondary | ICD-10-CM | POA: Diagnosis not present

## 2023-06-13 DIAGNOSIS — E559 Vitamin D deficiency, unspecified: Secondary | ICD-10-CM | POA: Diagnosis not present

## 2023-06-13 DIAGNOSIS — D649 Anemia, unspecified: Secondary | ICD-10-CM | POA: Diagnosis not present

## 2023-06-13 DIAGNOSIS — N390 Urinary tract infection, site not specified: Secondary | ICD-10-CM | POA: Diagnosis not present

## 2023-06-13 DIAGNOSIS — R262 Difficulty in walking, not elsewhere classified: Secondary | ICD-10-CM | POA: Diagnosis not present

## 2023-06-13 DIAGNOSIS — M6281 Muscle weakness (generalized): Secondary | ICD-10-CM | POA: Diagnosis not present

## 2023-06-16 DIAGNOSIS — N390 Urinary tract infection, site not specified: Secondary | ICD-10-CM | POA: Diagnosis not present

## 2023-06-16 DIAGNOSIS — R262 Difficulty in walking, not elsewhere classified: Secondary | ICD-10-CM | POA: Diagnosis not present

## 2023-06-16 DIAGNOSIS — M6281 Muscle weakness (generalized): Secondary | ICD-10-CM | POA: Diagnosis not present

## 2023-06-16 DIAGNOSIS — E559 Vitamin D deficiency, unspecified: Secondary | ICD-10-CM | POA: Diagnosis not present

## 2023-06-16 DIAGNOSIS — F4322 Adjustment disorder with anxiety: Secondary | ICD-10-CM | POA: Diagnosis not present

## 2023-06-16 DIAGNOSIS — H402230 Chronic angle-closure glaucoma, bilateral, stage unspecified: Secondary | ICD-10-CM | POA: Diagnosis not present

## 2023-06-16 DIAGNOSIS — D649 Anemia, unspecified: Secondary | ICD-10-CM | POA: Diagnosis not present

## 2023-06-17 DIAGNOSIS — M79675 Pain in left toe(s): Secondary | ICD-10-CM | POA: Diagnosis not present

## 2023-06-17 DIAGNOSIS — B351 Tinea unguium: Secondary | ICD-10-CM | POA: Diagnosis not present

## 2023-06-17 DIAGNOSIS — M79674 Pain in right toe(s): Secondary | ICD-10-CM | POA: Diagnosis not present

## 2023-06-18 DIAGNOSIS — R262 Difficulty in walking, not elsewhere classified: Secondary | ICD-10-CM | POA: Diagnosis not present

## 2023-06-18 DIAGNOSIS — D649 Anemia, unspecified: Secondary | ICD-10-CM | POA: Diagnosis not present

## 2023-06-18 DIAGNOSIS — M6281 Muscle weakness (generalized): Secondary | ICD-10-CM | POA: Diagnosis not present

## 2023-06-18 DIAGNOSIS — N390 Urinary tract infection, site not specified: Secondary | ICD-10-CM | POA: Diagnosis not present

## 2023-06-18 DIAGNOSIS — E559 Vitamin D deficiency, unspecified: Secondary | ICD-10-CM | POA: Diagnosis not present

## 2023-06-18 DIAGNOSIS — H402230 Chronic angle-closure glaucoma, bilateral, stage unspecified: Secondary | ICD-10-CM | POA: Diagnosis not present

## 2023-06-19 ENCOUNTER — Ambulatory Visit: Payer: Medicare HMO | Admitting: Oncology

## 2023-06-19 DIAGNOSIS — D649 Anemia, unspecified: Secondary | ICD-10-CM | POA: Diagnosis not present

## 2023-06-19 DIAGNOSIS — H402230 Chronic angle-closure glaucoma, bilateral, stage unspecified: Secondary | ICD-10-CM | POA: Diagnosis not present

## 2023-06-19 DIAGNOSIS — E559 Vitamin D deficiency, unspecified: Secondary | ICD-10-CM | POA: Diagnosis not present

## 2023-06-19 DIAGNOSIS — R262 Difficulty in walking, not elsewhere classified: Secondary | ICD-10-CM | POA: Diagnosis not present

## 2023-06-19 DIAGNOSIS — N390 Urinary tract infection, site not specified: Secondary | ICD-10-CM | POA: Diagnosis not present

## 2023-06-19 DIAGNOSIS — M6281 Muscle weakness (generalized): Secondary | ICD-10-CM | POA: Diagnosis not present

## 2023-06-20 DIAGNOSIS — D649 Anemia, unspecified: Secondary | ICD-10-CM | POA: Diagnosis not present

## 2023-06-20 DIAGNOSIS — E559 Vitamin D deficiency, unspecified: Secondary | ICD-10-CM | POA: Diagnosis not present

## 2023-06-20 DIAGNOSIS — N390 Urinary tract infection, site not specified: Secondary | ICD-10-CM | POA: Diagnosis not present

## 2023-06-20 DIAGNOSIS — H402230 Chronic angle-closure glaucoma, bilateral, stage unspecified: Secondary | ICD-10-CM | POA: Diagnosis not present

## 2023-06-20 DIAGNOSIS — M6281 Muscle weakness (generalized): Secondary | ICD-10-CM | POA: Diagnosis not present

## 2023-06-20 DIAGNOSIS — R262 Difficulty in walking, not elsewhere classified: Secondary | ICD-10-CM | POA: Diagnosis not present

## 2023-06-20 DIAGNOSIS — R609 Edema, unspecified: Secondary | ICD-10-CM | POA: Diagnosis not present

## 2023-06-20 DIAGNOSIS — I10 Essential (primary) hypertension: Secondary | ICD-10-CM | POA: Diagnosis not present

## 2023-06-24 DIAGNOSIS — D649 Anemia, unspecified: Secondary | ICD-10-CM | POA: Diagnosis not present

## 2023-06-24 DIAGNOSIS — E559 Vitamin D deficiency, unspecified: Secondary | ICD-10-CM | POA: Diagnosis not present

## 2023-06-24 DIAGNOSIS — N390 Urinary tract infection, site not specified: Secondary | ICD-10-CM | POA: Diagnosis not present

## 2023-06-24 DIAGNOSIS — R262 Difficulty in walking, not elsewhere classified: Secondary | ICD-10-CM | POA: Diagnosis not present

## 2023-06-24 DIAGNOSIS — H402230 Chronic angle-closure glaucoma, bilateral, stage unspecified: Secondary | ICD-10-CM | POA: Diagnosis not present

## 2023-06-24 DIAGNOSIS — M6281 Muscle weakness (generalized): Secondary | ICD-10-CM | POA: Diagnosis not present

## 2023-06-25 ENCOUNTER — Other Ambulatory Visit

## 2023-06-25 ENCOUNTER — Ambulatory Visit: Admitting: Oncology

## 2023-06-25 DIAGNOSIS — E559 Vitamin D deficiency, unspecified: Secondary | ICD-10-CM | POA: Diagnosis not present

## 2023-06-25 DIAGNOSIS — D649 Anemia, unspecified: Secondary | ICD-10-CM | POA: Diagnosis not present

## 2023-06-25 DIAGNOSIS — H402230 Chronic angle-closure glaucoma, bilateral, stage unspecified: Secondary | ICD-10-CM | POA: Diagnosis not present

## 2023-06-25 DIAGNOSIS — N390 Urinary tract infection, site not specified: Secondary | ICD-10-CM | POA: Diagnosis not present

## 2023-06-25 DIAGNOSIS — M6281 Muscle weakness (generalized): Secondary | ICD-10-CM | POA: Diagnosis not present

## 2023-06-25 DIAGNOSIS — R262 Difficulty in walking, not elsewhere classified: Secondary | ICD-10-CM | POA: Diagnosis not present

## 2023-06-26 ENCOUNTER — Inpatient Hospital Stay: Payer: Medicare HMO | Admitting: Oncology

## 2023-06-26 ENCOUNTER — Inpatient Hospital Stay: Payer: Medicare HMO

## 2023-06-27 DIAGNOSIS — M6281 Muscle weakness (generalized): Secondary | ICD-10-CM | POA: Diagnosis not present

## 2023-06-27 DIAGNOSIS — R262 Difficulty in walking, not elsewhere classified: Secondary | ICD-10-CM | POA: Diagnosis not present

## 2023-06-27 DIAGNOSIS — D649 Anemia, unspecified: Secondary | ICD-10-CM | POA: Diagnosis not present

## 2023-06-27 DIAGNOSIS — H402230 Chronic angle-closure glaucoma, bilateral, stage unspecified: Secondary | ICD-10-CM | POA: Diagnosis not present

## 2023-06-27 DIAGNOSIS — E559 Vitamin D deficiency, unspecified: Secondary | ICD-10-CM | POA: Diagnosis not present

## 2023-06-27 DIAGNOSIS — N390 Urinary tract infection, site not specified: Secondary | ICD-10-CM | POA: Diagnosis not present

## 2023-06-30 DIAGNOSIS — R262 Difficulty in walking, not elsewhere classified: Secondary | ICD-10-CM | POA: Diagnosis not present

## 2023-06-30 DIAGNOSIS — E559 Vitamin D deficiency, unspecified: Secondary | ICD-10-CM | POA: Diagnosis not present

## 2023-06-30 DIAGNOSIS — M6281 Muscle weakness (generalized): Secondary | ICD-10-CM | POA: Diagnosis not present

## 2023-06-30 DIAGNOSIS — H402230 Chronic angle-closure glaucoma, bilateral, stage unspecified: Secondary | ICD-10-CM | POA: Diagnosis not present

## 2023-06-30 DIAGNOSIS — D649 Anemia, unspecified: Secondary | ICD-10-CM | POA: Diagnosis not present

## 2023-06-30 DIAGNOSIS — N390 Urinary tract infection, site not specified: Secondary | ICD-10-CM | POA: Diagnosis not present

## 2023-06-30 NOTE — Progress Notes (Deleted)
 Referring Provider: Assunta Found, MD Primary Care Physician:  Assunta Found, MD Primary GI Physician: Dr. Levon Hedger   No chief complaint on file.   HPI:   Randy Russo is a 83 y.o. 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   Patient admitted 3/8-3/14 with sepsis in the setting of UTI, RSV, and found to have acute on chronic anemia with hemoglobin 6.1.  Heme-negative in the ED.  No overt GI bleeding.  However, iron panel showed low serum iron and iron saturation.  Received 2 units PRBCs and Eliquis was placed on hold.  Initially planned for inpatient EGD and colonoscopy, but ultimately deferred due to patient not being fit for endoscopic intervention/anesthesia due to ongoing dyspnea and patient's hemoglobin has been remaining stable without further need for transfusion.  Palliative evaluated patient while hospitalized and noted patient with a hospice candidate, but family wanted to "treat the treatable".  Dr. Tasia Catchings had lengthy discussion with patient's granddaughter, Lum Babe.  Will plan to only pursue procedures if absolutely necessary.  If blood counts stable, not, hold on procedure.  Hemoglobin was stable at 8.8 on day of discharge.  Pelvic on Eliquis was stopped at discharge as it appeared that portal vein thrombus had resolved.  Patient also had elevated bilirubin during admission which ultimately normalized.  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   Today:    Past Medical History:  Diagnosis Date   Arthritis    Cancer (HCC)    Prostate   COPD (chronic obstructive pulmonary disease) (HCC)    Diverticulitis    Falls frequently    Hypertension    Lower GI bleed 12/27/2017    Past Surgical History:  Procedure Laterality Date   CATARACT EXTRACTION W/PHACO Left 11/16/2012   Procedure: CATARACT  EXTRACTION PHACO AND INTRAOCULAR LENS PLACEMENT (IOC);  Surgeon: Gemma Payor, MD;  Location: [facility];  Service: Ophthalmology;  Laterality: Left;  CDE: 13.66   CATARACT EXTRACTION W/PHACO Right 01/18/2013   Procedure: CATARACT EXTRACTION PHACO AND INTRAOCULAR LENS PLACEMENT (IOC);  Surgeon: Gemma Payor, MD;  Location: [facility];  Service: Ophthalmology;  Laterality: Right;  CDE:8.79   CERVICAL FUSION  4 yrs ago   COLONOSCOPY N/A 12/28/2017   Procedure: COLONOSCOPY;  Surgeon: Malissa Hippo, MD;  [facility]: [facility];  Service: Endoscopy;  Laterality: N/A;   COLONOSCOPY N/A 01/01/2018   Procedure: COLONOSCOPY;  Surgeon: Malissa Hippo, MD;  [facility]: [facility];  Service: Endoscopy;  Laterality: N/A;   ESOPHAGOGASTRODUODENOSCOPY N/A 01/01/2018   Procedure: ESOPHAGOGASTRODUODENOSCOPY (EGD);  Surgeon: Malissa Hippo, MD;  [facility]: [facility];  Service: Endoscopy;  Laterality: N/A;   INGUINAL HERNIA REPAIR Right 03/12/2021   Procedure: HERNIA REPAIR INGUINAL ADULT;  Surgeon: Franky Macho, MD;  Location: [facility];  Service: General;  Laterality: Right;   JOINT REPLACEMENT Left 5 yrs ago   knee   PARTIAL COLECTOMY N/A 01/01/2018   Procedure: PARTIAL COLECTOMY;  Surgeon: Franky Macho, MD;  Location: [facility];  Service: General;  Laterality: N/A;   right hip replacement Right 3 yrs ago    Current Outpatient Medications  Medication Sig Dispense Refill   acetaminophen (TYLENOL) 325 MG tablet Take 650 mg by mouth every 6 (six) hours as needed for mild pain (pain score 1-3). Do not exceed 3,000 mg in 24 hours  albuterol (PROVENTIL) (2.5 MG/3ML) 0.083% nebulizer solution Take 3 mLs (2.5 mg total) by nebulization every 6 (six) hours as needed for wheezing or shortness of breath. 75 mL 12   ascorbic acid (VITAMIN C) 500 MG tablet Take 1 tablet (500 mg total) by mouth daily.     B Complex-C (B-COMPLEX WITH VITAMIN C) tablet Take 1 tablet by mouth daily.     Cholecalciferol 25 MCG (1000 UT) capsule  Take 1 capsule (1,000 Units total) by mouth daily.     docusate sodium (COLACE) 100 MG capsule Take 2 capsules (200 mg total) by mouth daily. 10 capsule 0   ferrous sulfate 325 (65 FE) MG tablet Take 1 tablet (325 mg total) by mouth 2 (two) times daily with a meal. 30 tablet 3   furosemide (LASIX) 20 MG tablet Take 20 mg by mouth daily.     latanoprost (XALATAN) 0.005 % ophthalmic solution Place 1 drop into both eyes at bedtime.     loratadine (CLARITIN) 10 MG tablet Take 1 tablet (10 mg total) by mouth daily.     mupirocin ointment (BACTROBAN) 2 % Apply 1 Application topically daily.     Nutritional Supplements (NUTRITIONAL SUPPLEMENT PLUS) LIQD Take 120 mLs by mouth daily.     SPIRIVA RESPIMAT 2.5 MCG/ACT AERS Inhale 2 puffs into the lungs daily.     No current facility-administered medications for this visit.    Allergies as of 07/02/2023   (No Known Allergies)    Family History  Problem Relation Age of Onset   Diabetes Mother    Dementia Father     Social History   Socioeconomic History   Marital status: Married    Spouse name: Not on file   Number of children: Not on file   Years of education: Not on file   Highest education level: Not on file  Occupational History   Not on file  Tobacco Use   Smoking status: Former    Types: Cigars   Smokeless tobacco: Never  Vaping Use   Vaping status: Never Used  Substance and Sexual Activity   Alcohol use: No   Drug use: No   Sexual activity: Not Currently  Other Topics Concern   Not on file  Social History Narrative   Not on file   Social Drivers of Health   Financial Resource Strain: Low Risk  (11/30/2022)   Received [facility]   Overall Financial Resource Strain (CARDIA)    Difficulty of Paying Living Expenses: Not very hard  Food Insecurity: No Food Insecurity (05/31/2023)   Hunger Vital Sign    Worried About Running Out of Food in the Last Year: Never true    Ran Out of Food in the Last Year: Never true   Recent Concern: Food Insecurity - Food Insecurity Present (04/22/2023)   Hunger Vital Sign    Worried About Running Out of Food in the Last Year: Never true    Ran Out of Food in the Last Year: Sometimes true  Transportation Needs: No Transportation Needs (05/31/2023)   PRAPARE - Administrator, Civil Service (Medical): No    Lack of Transportation (Non-Medical): No  Physical Activity: Unknown (12/27/2017)   Exercise Vital Sign    Days of Exercise per Week: Patient declined    Minutes of Exercise per Session: Patient declined  Stress: Not on file  Social Connections: Patient Unable To Answer (05/31/2023)   Social Connection and Isolation Panel [NHANES]    Frequency  of Communication with Friends and Family: Patient unable to answer    Frequency of Social Gatherings with Friends and Family: Patient unable to answer    Attends Religious Services: Patient unable to answer    Active Member of Clubs or Organizations: Patient unable to answer    Attends Banker Meetings: Patient unable to answer    Marital Status: Patient unable to answer    Review of Systems: Gen: Denies fever, chills, anorexia. Denies fatigue, weakness, weight loss.  CV: Denies chest pain, palpitations, syncope, peripheral edema, and claudication. Resp: Denies dyspnea at rest, cough, wheezing, coughing up blood, and pleurisy. GI: Denies vomiting blood, jaundice, and fecal incontinence.   Denies dysphagia or odynophagia. Derm: Denies rash, itching, dry skin Psych: Denies depression, anxiety, memory loss, confusion. No homicidal or suicidal ideation.  Heme: Denies bruising, bleeding, and enlarged lymph nodes.  Physical Exam: There were no vitals taken for this visit. General:   Alert and oriented. No distress noted. Pleasant and cooperative.  Head:  Normocephalic and atraumatic. Eyes:  Conjuctiva clear without scleral icterus. Heart:  S1, S2 present without murmurs appreciated. Lungs:  Clear to  auscultation bilaterally. No wheezes, rales, or rhonchi. No distress.  Abdomen:  +BS, soft, non-tender and non-distended. No rebound or guarding. No HSM or masses noted. Msk:  Symmetrical without gross deformities. Normal posture. Extremities:  Without edema. Neurologic:  Alert and  oriented x4 Psych:  Normal mood and affect.    Assessment:     Plan:  ***   Ermalinda Memos, PA-C [facility] 07/02/2023

## 2023-07-01 DIAGNOSIS — J4489 Other specified chronic obstructive pulmonary disease: Secondary | ICD-10-CM | POA: Diagnosis not present

## 2023-07-01 DIAGNOSIS — M6281 Muscle weakness (generalized): Secondary | ICD-10-CM | POA: Diagnosis not present

## 2023-07-02 ENCOUNTER — Encounter: Payer: Self-pay | Admitting: Gastroenterology

## 2023-07-02 ENCOUNTER — Inpatient Hospital Stay: Admitting: Gastroenterology

## 2023-07-02 DIAGNOSIS — J4489 Other specified chronic obstructive pulmonary disease: Secondary | ICD-10-CM | POA: Diagnosis not present

## 2023-07-02 DIAGNOSIS — R262 Difficulty in walking, not elsewhere classified: Secondary | ICD-10-CM | POA: Diagnosis not present

## 2023-07-02 DIAGNOSIS — D649 Anemia, unspecified: Secondary | ICD-10-CM | POA: Diagnosis not present

## 2023-07-02 DIAGNOSIS — N390 Urinary tract infection, site not specified: Secondary | ICD-10-CM | POA: Diagnosis not present

## 2023-07-02 DIAGNOSIS — E559 Vitamin D deficiency, unspecified: Secondary | ICD-10-CM | POA: Diagnosis not present

## 2023-07-02 DIAGNOSIS — M6281 Muscle weakness (generalized): Secondary | ICD-10-CM | POA: Diagnosis not present

## 2023-07-02 DIAGNOSIS — H402230 Chronic angle-closure glaucoma, bilateral, stage unspecified: Secondary | ICD-10-CM | POA: Diagnosis not present

## 2023-07-03 DIAGNOSIS — M6281 Muscle weakness (generalized): Secondary | ICD-10-CM | POA: Diagnosis not present

## 2023-07-03 DIAGNOSIS — J4489 Other specified chronic obstructive pulmonary disease: Secondary | ICD-10-CM | POA: Diagnosis not present

## 2023-07-04 DIAGNOSIS — H402230 Chronic angle-closure glaucoma, bilateral, stage unspecified: Secondary | ICD-10-CM | POA: Diagnosis not present

## 2023-07-04 DIAGNOSIS — D649 Anemia, unspecified: Secondary | ICD-10-CM | POA: Diagnosis not present

## 2023-07-04 DIAGNOSIS — E559 Vitamin D deficiency, unspecified: Secondary | ICD-10-CM | POA: Diagnosis not present

## 2023-07-04 DIAGNOSIS — N390 Urinary tract infection, site not specified: Secondary | ICD-10-CM | POA: Diagnosis not present

## 2023-07-04 DIAGNOSIS — R262 Difficulty in walking, not elsewhere classified: Secondary | ICD-10-CM | POA: Diagnosis not present

## 2023-07-04 DIAGNOSIS — J4489 Other specified chronic obstructive pulmonary disease: Secondary | ICD-10-CM | POA: Diagnosis not present

## 2023-07-04 DIAGNOSIS — M6281 Muscle weakness (generalized): Secondary | ICD-10-CM | POA: Diagnosis not present

## 2023-07-07 DIAGNOSIS — D649 Anemia, unspecified: Secondary | ICD-10-CM | POA: Diagnosis not present

## 2023-07-07 DIAGNOSIS — J4489 Other specified chronic obstructive pulmonary disease: Secondary | ICD-10-CM | POA: Diagnosis not present

## 2023-07-07 DIAGNOSIS — R262 Difficulty in walking, not elsewhere classified: Secondary | ICD-10-CM | POA: Diagnosis not present

## 2023-07-07 DIAGNOSIS — H402230 Chronic angle-closure glaucoma, bilateral, stage unspecified: Secondary | ICD-10-CM | POA: Diagnosis not present

## 2023-07-07 DIAGNOSIS — N390 Urinary tract infection, site not specified: Secondary | ICD-10-CM | POA: Diagnosis not present

## 2023-07-07 DIAGNOSIS — E559 Vitamin D deficiency, unspecified: Secondary | ICD-10-CM | POA: Diagnosis not present

## 2023-07-07 DIAGNOSIS — M6281 Muscle weakness (generalized): Secondary | ICD-10-CM | POA: Diagnosis not present

## 2023-07-07 DIAGNOSIS — F4322 Adjustment disorder with anxiety: Secondary | ICD-10-CM | POA: Diagnosis not present

## 2023-07-08 DIAGNOSIS — J4489 Other specified chronic obstructive pulmonary disease: Secondary | ICD-10-CM | POA: Diagnosis not present

## 2023-07-08 DIAGNOSIS — M6281 Muscle weakness (generalized): Secondary | ICD-10-CM | POA: Diagnosis not present

## 2023-07-09 DIAGNOSIS — R262 Difficulty in walking, not elsewhere classified: Secondary | ICD-10-CM | POA: Diagnosis not present

## 2023-07-09 DIAGNOSIS — D649 Anemia, unspecified: Secondary | ICD-10-CM | POA: Diagnosis not present

## 2023-07-09 DIAGNOSIS — N390 Urinary tract infection, site not specified: Secondary | ICD-10-CM | POA: Diagnosis not present

## 2023-07-09 DIAGNOSIS — E559 Vitamin D deficiency, unspecified: Secondary | ICD-10-CM | POA: Diagnosis not present

## 2023-07-09 DIAGNOSIS — H402230 Chronic angle-closure glaucoma, bilateral, stage unspecified: Secondary | ICD-10-CM | POA: Diagnosis not present

## 2023-07-09 DIAGNOSIS — J4489 Other specified chronic obstructive pulmonary disease: Secondary | ICD-10-CM | POA: Diagnosis not present

## 2023-07-09 DIAGNOSIS — M6281 Muscle weakness (generalized): Secondary | ICD-10-CM | POA: Diagnosis not present

## 2023-07-10 DIAGNOSIS — J4489 Other specified chronic obstructive pulmonary disease: Secondary | ICD-10-CM | POA: Diagnosis not present

## 2023-07-10 DIAGNOSIS — M6281 Muscle weakness (generalized): Secondary | ICD-10-CM | POA: Diagnosis not present

## 2023-07-11 DIAGNOSIS — H402230 Chronic angle-closure glaucoma, bilateral, stage unspecified: Secondary | ICD-10-CM | POA: Diagnosis not present

## 2023-07-11 DIAGNOSIS — N390 Urinary tract infection, site not specified: Secondary | ICD-10-CM | POA: Diagnosis not present

## 2023-07-11 DIAGNOSIS — E559 Vitamin D deficiency, unspecified: Secondary | ICD-10-CM | POA: Diagnosis not present

## 2023-07-11 DIAGNOSIS — R262 Difficulty in walking, not elsewhere classified: Secondary | ICD-10-CM | POA: Diagnosis not present

## 2023-07-11 DIAGNOSIS — J4489 Other specified chronic obstructive pulmonary disease: Secondary | ICD-10-CM | POA: Diagnosis not present

## 2023-07-11 DIAGNOSIS — D649 Anemia, unspecified: Secondary | ICD-10-CM | POA: Diagnosis not present

## 2023-07-11 DIAGNOSIS — M6281 Muscle weakness (generalized): Secondary | ICD-10-CM | POA: Diagnosis not present

## 2023-07-14 DIAGNOSIS — F4322 Adjustment disorder with anxiety: Secondary | ICD-10-CM | POA: Diagnosis not present

## 2023-07-14 DIAGNOSIS — R262 Difficulty in walking, not elsewhere classified: Secondary | ICD-10-CM | POA: Diagnosis not present

## 2023-07-14 DIAGNOSIS — H402230 Chronic angle-closure glaucoma, bilateral, stage unspecified: Secondary | ICD-10-CM | POA: Diagnosis not present

## 2023-07-14 DIAGNOSIS — N390 Urinary tract infection, site not specified: Secondary | ICD-10-CM | POA: Diagnosis not present

## 2023-07-14 DIAGNOSIS — M6281 Muscle weakness (generalized): Secondary | ICD-10-CM | POA: Diagnosis not present

## 2023-07-14 DIAGNOSIS — D649 Anemia, unspecified: Secondary | ICD-10-CM | POA: Diagnosis not present

## 2023-07-14 DIAGNOSIS — J4489 Other specified chronic obstructive pulmonary disease: Secondary | ICD-10-CM | POA: Diagnosis not present

## 2023-07-14 DIAGNOSIS — E559 Vitamin D deficiency, unspecified: Secondary | ICD-10-CM | POA: Diagnosis not present

## 2023-07-15 DIAGNOSIS — J4489 Other specified chronic obstructive pulmonary disease: Secondary | ICD-10-CM | POA: Diagnosis not present

## 2023-07-15 DIAGNOSIS — M6281 Muscle weakness (generalized): Secondary | ICD-10-CM | POA: Diagnosis not present

## 2023-07-16 DIAGNOSIS — M6281 Muscle weakness (generalized): Secondary | ICD-10-CM | POA: Diagnosis not present

## 2023-07-16 DIAGNOSIS — J4489 Other specified chronic obstructive pulmonary disease: Secondary | ICD-10-CM | POA: Diagnosis not present

## 2023-07-16 DIAGNOSIS — D649 Anemia, unspecified: Secondary | ICD-10-CM | POA: Diagnosis not present

## 2023-07-16 DIAGNOSIS — E559 Vitamin D deficiency, unspecified: Secondary | ICD-10-CM | POA: Diagnosis not present

## 2023-07-16 DIAGNOSIS — R262 Difficulty in walking, not elsewhere classified: Secondary | ICD-10-CM | POA: Diagnosis not present

## 2023-07-16 DIAGNOSIS — H402230 Chronic angle-closure glaucoma, bilateral, stage unspecified: Secondary | ICD-10-CM | POA: Diagnosis not present

## 2023-07-17 DIAGNOSIS — J4489 Other specified chronic obstructive pulmonary disease: Secondary | ICD-10-CM | POA: Diagnosis not present

## 2023-07-17 DIAGNOSIS — M6281 Muscle weakness (generalized): Secondary | ICD-10-CM | POA: Diagnosis not present

## 2023-07-18 DIAGNOSIS — H402230 Chronic angle-closure glaucoma, bilateral, stage unspecified: Secondary | ICD-10-CM | POA: Diagnosis not present

## 2023-07-18 DIAGNOSIS — D649 Anemia, unspecified: Secondary | ICD-10-CM | POA: Diagnosis not present

## 2023-07-18 DIAGNOSIS — E559 Vitamin D deficiency, unspecified: Secondary | ICD-10-CM | POA: Diagnosis not present

## 2023-07-18 DIAGNOSIS — R262 Difficulty in walking, not elsewhere classified: Secondary | ICD-10-CM | POA: Diagnosis not present

## 2023-07-18 DIAGNOSIS — J4489 Other specified chronic obstructive pulmonary disease: Secondary | ICD-10-CM | POA: Diagnosis not present

## 2023-07-18 DIAGNOSIS — M6281 Muscle weakness (generalized): Secondary | ICD-10-CM | POA: Diagnosis not present

## 2023-07-21 DIAGNOSIS — F4322 Adjustment disorder with anxiety: Secondary | ICD-10-CM | POA: Diagnosis not present

## 2023-07-21 DIAGNOSIS — D649 Anemia, unspecified: Secondary | ICD-10-CM | POA: Diagnosis not present

## 2023-07-21 DIAGNOSIS — J4489 Other specified chronic obstructive pulmonary disease: Secondary | ICD-10-CM | POA: Diagnosis not present

## 2023-07-21 DIAGNOSIS — E559 Vitamin D deficiency, unspecified: Secondary | ICD-10-CM | POA: Diagnosis not present

## 2023-07-21 DIAGNOSIS — M6281 Muscle weakness (generalized): Secondary | ICD-10-CM | POA: Diagnosis not present

## 2023-07-21 DIAGNOSIS — R262 Difficulty in walking, not elsewhere classified: Secondary | ICD-10-CM | POA: Diagnosis not present

## 2023-07-21 DIAGNOSIS — H402230 Chronic angle-closure glaucoma, bilateral, stage unspecified: Secondary | ICD-10-CM | POA: Diagnosis not present

## 2023-07-22 DIAGNOSIS — M6281 Muscle weakness (generalized): Secondary | ICD-10-CM | POA: Diagnosis not present

## 2023-07-22 DIAGNOSIS — J4489 Other specified chronic obstructive pulmonary disease: Secondary | ICD-10-CM | POA: Diagnosis not present

## 2023-07-23 DIAGNOSIS — D649 Anemia, unspecified: Secondary | ICD-10-CM | POA: Diagnosis not present

## 2023-07-23 DIAGNOSIS — J4489 Other specified chronic obstructive pulmonary disease: Secondary | ICD-10-CM | POA: Diagnosis not present

## 2023-07-23 DIAGNOSIS — H402230 Chronic angle-closure glaucoma, bilateral, stage unspecified: Secondary | ICD-10-CM | POA: Diagnosis not present

## 2023-07-23 DIAGNOSIS — R262 Difficulty in walking, not elsewhere classified: Secondary | ICD-10-CM | POA: Diagnosis not present

## 2023-07-23 DIAGNOSIS — M6281 Muscle weakness (generalized): Secondary | ICD-10-CM | POA: Diagnosis not present

## 2023-07-23 DIAGNOSIS — E559 Vitamin D deficiency, unspecified: Secondary | ICD-10-CM | POA: Diagnosis not present

## 2023-07-24 DIAGNOSIS — M6281 Muscle weakness (generalized): Secondary | ICD-10-CM | POA: Diagnosis not present

## 2023-07-24 DIAGNOSIS — J4489 Other specified chronic obstructive pulmonary disease: Secondary | ICD-10-CM | POA: Diagnosis not present

## 2023-07-25 DIAGNOSIS — J4489 Other specified chronic obstructive pulmonary disease: Secondary | ICD-10-CM | POA: Diagnosis not present

## 2023-07-25 DIAGNOSIS — M6281 Muscle weakness (generalized): Secondary | ICD-10-CM | POA: Diagnosis not present

## 2023-07-28 DIAGNOSIS — F4322 Adjustment disorder with anxiety: Secondary | ICD-10-CM | POA: Diagnosis not present

## 2023-07-28 DIAGNOSIS — M6281 Muscle weakness (generalized): Secondary | ICD-10-CM | POA: Diagnosis not present

## 2023-07-28 DIAGNOSIS — J4489 Other specified chronic obstructive pulmonary disease: Secondary | ICD-10-CM | POA: Diagnosis not present

## 2023-07-29 DIAGNOSIS — M6281 Muscle weakness (generalized): Secondary | ICD-10-CM | POA: Diagnosis not present

## 2023-07-29 DIAGNOSIS — J4489 Other specified chronic obstructive pulmonary disease: Secondary | ICD-10-CM | POA: Diagnosis not present

## 2023-07-30 DIAGNOSIS — M6281 Muscle weakness (generalized): Secondary | ICD-10-CM | POA: Diagnosis not present

## 2023-07-30 DIAGNOSIS — J4489 Other specified chronic obstructive pulmonary disease: Secondary | ICD-10-CM | POA: Diagnosis not present

## 2023-07-31 DIAGNOSIS — J4489 Other specified chronic obstructive pulmonary disease: Secondary | ICD-10-CM | POA: Diagnosis not present

## 2023-07-31 DIAGNOSIS — M6281 Muscle weakness (generalized): Secondary | ICD-10-CM | POA: Diagnosis not present

## 2023-08-01 DIAGNOSIS — M6281 Muscle weakness (generalized): Secondary | ICD-10-CM | POA: Diagnosis not present

## 2023-08-01 DIAGNOSIS — J4489 Other specified chronic obstructive pulmonary disease: Secondary | ICD-10-CM | POA: Diagnosis not present

## 2023-08-04 DIAGNOSIS — J4489 Other specified chronic obstructive pulmonary disease: Secondary | ICD-10-CM | POA: Diagnosis not present

## 2023-08-04 DIAGNOSIS — M6281 Muscle weakness (generalized): Secondary | ICD-10-CM | POA: Diagnosis not present

## 2023-08-05 DIAGNOSIS — M6281 Muscle weakness (generalized): Secondary | ICD-10-CM | POA: Diagnosis not present

## 2023-08-05 DIAGNOSIS — J4489 Other specified chronic obstructive pulmonary disease: Secondary | ICD-10-CM | POA: Diagnosis not present

## 2023-08-06 DIAGNOSIS — J4489 Other specified chronic obstructive pulmonary disease: Secondary | ICD-10-CM | POA: Diagnosis not present

## 2023-08-06 DIAGNOSIS — M6281 Muscle weakness (generalized): Secondary | ICD-10-CM | POA: Diagnosis not present

## 2023-08-07 DIAGNOSIS — M6281 Muscle weakness (generalized): Secondary | ICD-10-CM | POA: Diagnosis not present

## 2023-08-07 DIAGNOSIS — J4489 Other specified chronic obstructive pulmonary disease: Secondary | ICD-10-CM | POA: Diagnosis not present

## 2023-08-08 DIAGNOSIS — M6281 Muscle weakness (generalized): Secondary | ICD-10-CM | POA: Diagnosis not present

## 2023-08-08 DIAGNOSIS — J4489 Other specified chronic obstructive pulmonary disease: Secondary | ICD-10-CM | POA: Diagnosis not present

## 2023-08-11 DIAGNOSIS — M6281 Muscle weakness (generalized): Secondary | ICD-10-CM | POA: Diagnosis not present

## 2023-08-11 DIAGNOSIS — J4489 Other specified chronic obstructive pulmonary disease: Secondary | ICD-10-CM | POA: Diagnosis not present

## 2023-08-13 DIAGNOSIS — M6281 Muscle weakness (generalized): Secondary | ICD-10-CM | POA: Diagnosis not present

## 2023-08-13 DIAGNOSIS — J4489 Other specified chronic obstructive pulmonary disease: Secondary | ICD-10-CM | POA: Diagnosis not present

## 2023-08-14 DIAGNOSIS — J4489 Other specified chronic obstructive pulmonary disease: Secondary | ICD-10-CM | POA: Diagnosis not present

## 2023-08-14 DIAGNOSIS — M6281 Muscle weakness (generalized): Secondary | ICD-10-CM | POA: Diagnosis not present

## 2023-08-14 DIAGNOSIS — J449 Chronic obstructive pulmonary disease, unspecified: Secondary | ICD-10-CM | POA: Diagnosis not present

## 2023-08-15 DIAGNOSIS — J4489 Other specified chronic obstructive pulmonary disease: Secondary | ICD-10-CM | POA: Diagnosis not present

## 2023-08-15 DIAGNOSIS — M6281 Muscle weakness (generalized): Secondary | ICD-10-CM | POA: Diagnosis not present

## 2023-08-18 DIAGNOSIS — M6281 Muscle weakness (generalized): Secondary | ICD-10-CM | POA: Diagnosis not present

## 2023-08-18 DIAGNOSIS — J4489 Other specified chronic obstructive pulmonary disease: Secondary | ICD-10-CM | POA: Diagnosis not present

## 2023-08-19 DIAGNOSIS — M6281 Muscle weakness (generalized): Secondary | ICD-10-CM | POA: Diagnosis not present

## 2023-08-19 DIAGNOSIS — J4489 Other specified chronic obstructive pulmonary disease: Secondary | ICD-10-CM | POA: Diagnosis not present

## 2023-08-20 DIAGNOSIS — J4489 Other specified chronic obstructive pulmonary disease: Secondary | ICD-10-CM | POA: Diagnosis not present

## 2023-08-20 DIAGNOSIS — M6281 Muscle weakness (generalized): Secondary | ICD-10-CM | POA: Diagnosis not present

## 2023-08-21 DIAGNOSIS — M6281 Muscle weakness (generalized): Secondary | ICD-10-CM | POA: Diagnosis not present

## 2023-08-21 DIAGNOSIS — J4489 Other specified chronic obstructive pulmonary disease: Secondary | ICD-10-CM | POA: Diagnosis not present

## 2023-08-22 DIAGNOSIS — M6281 Muscle weakness (generalized): Secondary | ICD-10-CM | POA: Diagnosis not present

## 2023-08-22 DIAGNOSIS — J4489 Other specified chronic obstructive pulmonary disease: Secondary | ICD-10-CM | POA: Diagnosis not present

## 2023-08-25 DIAGNOSIS — M6281 Muscle weakness (generalized): Secondary | ICD-10-CM | POA: Diagnosis not present

## 2023-08-25 DIAGNOSIS — F4322 Adjustment disorder with anxiety: Secondary | ICD-10-CM | POA: Diagnosis not present

## 2023-08-25 DIAGNOSIS — J4489 Other specified chronic obstructive pulmonary disease: Secondary | ICD-10-CM | POA: Diagnosis not present

## 2023-08-26 DIAGNOSIS — J4489 Other specified chronic obstructive pulmonary disease: Secondary | ICD-10-CM | POA: Diagnosis not present

## 2023-08-26 DIAGNOSIS — M6281 Muscle weakness (generalized): Secondary | ICD-10-CM | POA: Diagnosis not present

## 2023-08-27 DIAGNOSIS — J4489 Other specified chronic obstructive pulmonary disease: Secondary | ICD-10-CM | POA: Diagnosis not present

## 2023-08-27 DIAGNOSIS — M6281 Muscle weakness (generalized): Secondary | ICD-10-CM | POA: Diagnosis not present

## 2023-08-28 DIAGNOSIS — J4489 Other specified chronic obstructive pulmonary disease: Secondary | ICD-10-CM | POA: Diagnosis not present

## 2023-08-28 DIAGNOSIS — M6281 Muscle weakness (generalized): Secondary | ICD-10-CM | POA: Diagnosis not present

## 2023-08-29 DIAGNOSIS — M6281 Muscle weakness (generalized): Secondary | ICD-10-CM | POA: Diagnosis not present

## 2023-08-29 DIAGNOSIS — J4489 Other specified chronic obstructive pulmonary disease: Secondary | ICD-10-CM | POA: Diagnosis not present

## 2023-09-01 DIAGNOSIS — F4322 Adjustment disorder with anxiety: Secondary | ICD-10-CM | POA: Diagnosis not present

## 2023-09-01 DIAGNOSIS — M6281 Muscle weakness (generalized): Secondary | ICD-10-CM | POA: Diagnosis not present

## 2023-09-01 DIAGNOSIS — J4489 Other specified chronic obstructive pulmonary disease: Secondary | ICD-10-CM | POA: Diagnosis not present

## 2023-09-02 DIAGNOSIS — M6281 Muscle weakness (generalized): Secondary | ICD-10-CM | POA: Diagnosis not present

## 2023-09-02 DIAGNOSIS — J4489 Other specified chronic obstructive pulmonary disease: Secondary | ICD-10-CM | POA: Diagnosis not present

## 2023-09-03 DIAGNOSIS — M6281 Muscle weakness (generalized): Secondary | ICD-10-CM | POA: Diagnosis not present

## 2023-09-03 DIAGNOSIS — J4489 Other specified chronic obstructive pulmonary disease: Secondary | ICD-10-CM | POA: Diagnosis not present

## 2023-09-04 DIAGNOSIS — J4489 Other specified chronic obstructive pulmonary disease: Secondary | ICD-10-CM | POA: Diagnosis not present

## 2023-09-04 DIAGNOSIS — M6281 Muscle weakness (generalized): Secondary | ICD-10-CM | POA: Diagnosis not present

## 2023-09-06 DIAGNOSIS — M6281 Muscle weakness (generalized): Secondary | ICD-10-CM | POA: Diagnosis not present

## 2023-09-06 DIAGNOSIS — J4489 Other specified chronic obstructive pulmonary disease: Secondary | ICD-10-CM | POA: Diagnosis not present

## 2023-09-08 DIAGNOSIS — J4489 Other specified chronic obstructive pulmonary disease: Secondary | ICD-10-CM | POA: Diagnosis not present

## 2023-09-08 DIAGNOSIS — M6281 Muscle weakness (generalized): Secondary | ICD-10-CM | POA: Diagnosis not present

## 2023-09-09 DIAGNOSIS — J4489 Other specified chronic obstructive pulmonary disease: Secondary | ICD-10-CM | POA: Diagnosis not present

## 2023-09-09 DIAGNOSIS — M6281 Muscle weakness (generalized): Secondary | ICD-10-CM | POA: Diagnosis not present

## 2023-09-10 DIAGNOSIS — J4489 Other specified chronic obstructive pulmonary disease: Secondary | ICD-10-CM | POA: Diagnosis not present

## 2023-09-10 DIAGNOSIS — M6281 Muscle weakness (generalized): Secondary | ICD-10-CM | POA: Diagnosis not present

## 2023-09-11 DIAGNOSIS — M6281 Muscle weakness (generalized): Secondary | ICD-10-CM | POA: Diagnosis not present

## 2023-09-11 DIAGNOSIS — J4489 Other specified chronic obstructive pulmonary disease: Secondary | ICD-10-CM | POA: Diagnosis not present

## 2023-09-12 DIAGNOSIS — M6281 Muscle weakness (generalized): Secondary | ICD-10-CM | POA: Diagnosis not present

## 2023-09-12 DIAGNOSIS — J4489 Other specified chronic obstructive pulmonary disease: Secondary | ICD-10-CM | POA: Diagnosis not present

## 2023-09-15 DIAGNOSIS — M6281 Muscle weakness (generalized): Secondary | ICD-10-CM | POA: Diagnosis not present

## 2023-09-15 DIAGNOSIS — J4489 Other specified chronic obstructive pulmonary disease: Secondary | ICD-10-CM | POA: Diagnosis not present

## 2023-09-16 DIAGNOSIS — J4489 Other specified chronic obstructive pulmonary disease: Secondary | ICD-10-CM | POA: Diagnosis not present

## 2023-09-16 DIAGNOSIS — M6281 Muscle weakness (generalized): Secondary | ICD-10-CM | POA: Diagnosis not present

## 2023-09-17 DIAGNOSIS — J4489 Other specified chronic obstructive pulmonary disease: Secondary | ICD-10-CM | POA: Diagnosis not present

## 2023-09-17 DIAGNOSIS — M6281 Muscle weakness (generalized): Secondary | ICD-10-CM | POA: Diagnosis not present

## 2023-09-18 DIAGNOSIS — M6281 Muscle weakness (generalized): Secondary | ICD-10-CM | POA: Diagnosis not present

## 2023-09-18 DIAGNOSIS — J4489 Other specified chronic obstructive pulmonary disease: Secondary | ICD-10-CM | POA: Diagnosis not present

## 2023-09-19 DIAGNOSIS — J4489 Other specified chronic obstructive pulmonary disease: Secondary | ICD-10-CM | POA: Diagnosis not present

## 2023-09-19 DIAGNOSIS — M6281 Muscle weakness (generalized): Secondary | ICD-10-CM | POA: Diagnosis not present

## 2023-09-22 DIAGNOSIS — M6281 Muscle weakness (generalized): Secondary | ICD-10-CM | POA: Diagnosis not present

## 2023-09-22 DIAGNOSIS — J4489 Other specified chronic obstructive pulmonary disease: Secondary | ICD-10-CM | POA: Diagnosis not present

## 2023-09-22 DIAGNOSIS — F4322 Adjustment disorder with anxiety: Secondary | ICD-10-CM | POA: Diagnosis not present

## 2023-09-23 DIAGNOSIS — R278 Other lack of coordination: Secondary | ICD-10-CM | POA: Diagnosis not present

## 2023-09-23 DIAGNOSIS — M6281 Muscle weakness (generalized): Secondary | ICD-10-CM | POA: Diagnosis not present

## 2023-09-23 DIAGNOSIS — J4489 Other specified chronic obstructive pulmonary disease: Secondary | ICD-10-CM | POA: Diagnosis not present

## 2023-09-24 DIAGNOSIS — R278 Other lack of coordination: Secondary | ICD-10-CM | POA: Diagnosis not present

## 2023-09-24 DIAGNOSIS — J4489 Other specified chronic obstructive pulmonary disease: Secondary | ICD-10-CM | POA: Diagnosis not present

## 2023-09-24 DIAGNOSIS — M6281 Muscle weakness (generalized): Secondary | ICD-10-CM | POA: Diagnosis not present

## 2023-09-25 DIAGNOSIS — R278 Other lack of coordination: Secondary | ICD-10-CM | POA: Diagnosis not present

## 2023-09-25 DIAGNOSIS — J4489 Other specified chronic obstructive pulmonary disease: Secondary | ICD-10-CM | POA: Diagnosis not present

## 2023-09-25 DIAGNOSIS — M6281 Muscle weakness (generalized): Secondary | ICD-10-CM | POA: Diagnosis not present

## 2023-09-26 DIAGNOSIS — M6281 Muscle weakness (generalized): Secondary | ICD-10-CM | POA: Diagnosis not present

## 2023-09-26 DIAGNOSIS — J4489 Other specified chronic obstructive pulmonary disease: Secondary | ICD-10-CM | POA: Diagnosis not present

## 2023-09-26 DIAGNOSIS — R278 Other lack of coordination: Secondary | ICD-10-CM | POA: Diagnosis not present

## 2023-09-29 DIAGNOSIS — M6281 Muscle weakness (generalized): Secondary | ICD-10-CM | POA: Diagnosis not present

## 2023-09-29 DIAGNOSIS — J4489 Other specified chronic obstructive pulmonary disease: Secondary | ICD-10-CM | POA: Diagnosis not present

## 2023-09-29 DIAGNOSIS — R278 Other lack of coordination: Secondary | ICD-10-CM | POA: Diagnosis not present

## 2023-09-30 DIAGNOSIS — M6281 Muscle weakness (generalized): Secondary | ICD-10-CM | POA: Diagnosis not present

## 2023-09-30 DIAGNOSIS — J4489 Other specified chronic obstructive pulmonary disease: Secondary | ICD-10-CM | POA: Diagnosis not present

## 2023-09-30 DIAGNOSIS — R278 Other lack of coordination: Secondary | ICD-10-CM | POA: Diagnosis not present

## 2023-10-01 DIAGNOSIS — M79674 Pain in right toe(s): Secondary | ICD-10-CM | POA: Diagnosis not present

## 2023-10-01 DIAGNOSIS — R278 Other lack of coordination: Secondary | ICD-10-CM | POA: Diagnosis not present

## 2023-10-01 DIAGNOSIS — M79675 Pain in left toe(s): Secondary | ICD-10-CM | POA: Diagnosis not present

## 2023-10-01 DIAGNOSIS — B351 Tinea unguium: Secondary | ICD-10-CM | POA: Diagnosis not present

## 2023-10-01 DIAGNOSIS — M6281 Muscle weakness (generalized): Secondary | ICD-10-CM | POA: Diagnosis not present

## 2023-10-01 DIAGNOSIS — J4489 Other specified chronic obstructive pulmonary disease: Secondary | ICD-10-CM | POA: Diagnosis not present

## 2023-10-02 DIAGNOSIS — R278 Other lack of coordination: Secondary | ICD-10-CM | POA: Diagnosis not present

## 2023-10-02 DIAGNOSIS — J4489 Other specified chronic obstructive pulmonary disease: Secondary | ICD-10-CM | POA: Diagnosis not present

## 2023-10-02 DIAGNOSIS — M6281 Muscle weakness (generalized): Secondary | ICD-10-CM | POA: Diagnosis not present

## 2023-10-03 DIAGNOSIS — J4489 Other specified chronic obstructive pulmonary disease: Secondary | ICD-10-CM | POA: Diagnosis not present

## 2023-10-03 DIAGNOSIS — R278 Other lack of coordination: Secondary | ICD-10-CM | POA: Diagnosis not present

## 2023-10-03 DIAGNOSIS — M6281 Muscle weakness (generalized): Secondary | ICD-10-CM | POA: Diagnosis not present

## 2023-10-06 DIAGNOSIS — R278 Other lack of coordination: Secondary | ICD-10-CM | POA: Diagnosis not present

## 2023-10-06 DIAGNOSIS — J4489 Other specified chronic obstructive pulmonary disease: Secondary | ICD-10-CM | POA: Diagnosis not present

## 2023-10-06 DIAGNOSIS — M6281 Muscle weakness (generalized): Secondary | ICD-10-CM | POA: Diagnosis not present

## 2023-10-07 DIAGNOSIS — M6281 Muscle weakness (generalized): Secondary | ICD-10-CM | POA: Diagnosis not present

## 2023-10-07 DIAGNOSIS — R278 Other lack of coordination: Secondary | ICD-10-CM | POA: Diagnosis not present

## 2023-10-07 DIAGNOSIS — J4489 Other specified chronic obstructive pulmonary disease: Secondary | ICD-10-CM | POA: Diagnosis not present

## 2023-10-08 DIAGNOSIS — R278 Other lack of coordination: Secondary | ICD-10-CM | POA: Diagnosis not present

## 2023-10-08 DIAGNOSIS — M6281 Muscle weakness (generalized): Secondary | ICD-10-CM | POA: Diagnosis not present

## 2023-10-08 DIAGNOSIS — J4489 Other specified chronic obstructive pulmonary disease: Secondary | ICD-10-CM | POA: Diagnosis not present

## 2023-10-08 DIAGNOSIS — M1712 Unilateral primary osteoarthritis, left knee: Secondary | ICD-10-CM | POA: Diagnosis not present

## 2023-10-08 DIAGNOSIS — J449 Chronic obstructive pulmonary disease, unspecified: Secondary | ICD-10-CM | POA: Diagnosis not present

## 2023-10-09 DIAGNOSIS — M6281 Muscle weakness (generalized): Secondary | ICD-10-CM | POA: Diagnosis not present

## 2023-10-09 DIAGNOSIS — J4489 Other specified chronic obstructive pulmonary disease: Secondary | ICD-10-CM | POA: Diagnosis not present

## 2023-10-09 DIAGNOSIS — E559 Vitamin D deficiency, unspecified: Secondary | ICD-10-CM | POA: Diagnosis not present

## 2023-10-09 DIAGNOSIS — R278 Other lack of coordination: Secondary | ICD-10-CM | POA: Diagnosis not present

## 2023-10-09 DIAGNOSIS — I10 Essential (primary) hypertension: Secondary | ICD-10-CM | POA: Diagnosis not present

## 2023-10-09 DIAGNOSIS — Z0182 Encounter for allergy testing: Secondary | ICD-10-CM | POA: Diagnosis not present

## 2023-10-09 DIAGNOSIS — I509 Heart failure, unspecified: Secondary | ICD-10-CM | POA: Diagnosis not present

## 2023-10-09 DIAGNOSIS — Z130 Encounter for screening for diseases of the blood and blood-forming organs and certain disorders involving the immune mechanism: Secondary | ICD-10-CM | POA: Diagnosis not present

## 2023-10-09 DIAGNOSIS — D649 Anemia, unspecified: Secondary | ICD-10-CM | POA: Diagnosis not present

## 2023-10-10 DIAGNOSIS — M6281 Muscle weakness (generalized): Secondary | ICD-10-CM | POA: Diagnosis not present

## 2023-10-10 DIAGNOSIS — R278 Other lack of coordination: Secondary | ICD-10-CM | POA: Diagnosis not present

## 2023-10-10 DIAGNOSIS — J4489 Other specified chronic obstructive pulmonary disease: Secondary | ICD-10-CM | POA: Diagnosis not present

## 2023-10-11 DIAGNOSIS — M6281 Muscle weakness (generalized): Secondary | ICD-10-CM | POA: Diagnosis not present

## 2023-10-11 DIAGNOSIS — J4489 Other specified chronic obstructive pulmonary disease: Secondary | ICD-10-CM | POA: Diagnosis not present

## 2023-10-11 DIAGNOSIS — R278 Other lack of coordination: Secondary | ICD-10-CM | POA: Diagnosis not present

## 2023-10-13 DIAGNOSIS — R278 Other lack of coordination: Secondary | ICD-10-CM | POA: Diagnosis not present

## 2023-10-13 DIAGNOSIS — J449 Chronic obstructive pulmonary disease, unspecified: Secondary | ICD-10-CM | POA: Diagnosis not present

## 2023-10-13 DIAGNOSIS — M1712 Unilateral primary osteoarthritis, left knee: Secondary | ICD-10-CM | POA: Diagnosis not present

## 2023-10-13 DIAGNOSIS — M6281 Muscle weakness (generalized): Secondary | ICD-10-CM | POA: Diagnosis not present

## 2023-10-14 DIAGNOSIS — I10 Essential (primary) hypertension: Secondary | ICD-10-CM | POA: Diagnosis not present

## 2023-10-14 DIAGNOSIS — M6281 Muscle weakness (generalized): Secondary | ICD-10-CM | POA: Diagnosis not present

## 2023-10-14 DIAGNOSIS — D649 Anemia, unspecified: Secondary | ICD-10-CM | POA: Diagnosis not present

## 2023-10-14 DIAGNOSIS — R278 Other lack of coordination: Secondary | ICD-10-CM | POA: Diagnosis not present

## 2023-10-14 DIAGNOSIS — J4489 Other specified chronic obstructive pulmonary disease: Secondary | ICD-10-CM | POA: Diagnosis not present

## 2023-10-14 DIAGNOSIS — E559 Vitamin D deficiency, unspecified: Secondary | ICD-10-CM | POA: Diagnosis not present

## 2023-10-14 DIAGNOSIS — I509 Heart failure, unspecified: Secondary | ICD-10-CM | POA: Diagnosis not present

## 2023-10-14 DIAGNOSIS — Z8546 Personal history of malignant neoplasm of prostate: Secondary | ICD-10-CM | POA: Diagnosis not present

## 2023-10-15 DIAGNOSIS — E559 Vitamin D deficiency, unspecified: Secondary | ICD-10-CM | POA: Diagnosis not present

## 2023-10-15 DIAGNOSIS — I10 Essential (primary) hypertension: Secondary | ICD-10-CM | POA: Diagnosis not present

## 2023-10-15 DIAGNOSIS — J4489 Other specified chronic obstructive pulmonary disease: Secondary | ICD-10-CM | POA: Diagnosis not present

## 2023-10-15 DIAGNOSIS — M6281 Muscle weakness (generalized): Secondary | ICD-10-CM | POA: Diagnosis not present

## 2023-10-15 DIAGNOSIS — J449 Chronic obstructive pulmonary disease, unspecified: Secondary | ICD-10-CM | POA: Diagnosis not present

## 2023-10-15 DIAGNOSIS — D649 Anemia, unspecified: Secondary | ICD-10-CM | POA: Diagnosis not present

## 2023-10-15 DIAGNOSIS — R278 Other lack of coordination: Secondary | ICD-10-CM | POA: Diagnosis not present

## 2023-10-16 DIAGNOSIS — R278 Other lack of coordination: Secondary | ICD-10-CM | POA: Diagnosis not present

## 2023-10-16 DIAGNOSIS — J4489 Other specified chronic obstructive pulmonary disease: Secondary | ICD-10-CM | POA: Diagnosis not present

## 2023-10-16 DIAGNOSIS — M6281 Muscle weakness (generalized): Secondary | ICD-10-CM | POA: Diagnosis not present

## 2023-10-17 DIAGNOSIS — M6281 Muscle weakness (generalized): Secondary | ICD-10-CM | POA: Diagnosis not present

## 2023-10-17 DIAGNOSIS — R278 Other lack of coordination: Secondary | ICD-10-CM | POA: Diagnosis not present

## 2023-10-17 DIAGNOSIS — J4489 Other specified chronic obstructive pulmonary disease: Secondary | ICD-10-CM | POA: Diagnosis not present

## 2023-10-20 DIAGNOSIS — J4489 Other specified chronic obstructive pulmonary disease: Secondary | ICD-10-CM | POA: Diagnosis not present

## 2023-10-20 DIAGNOSIS — M6281 Muscle weakness (generalized): Secondary | ICD-10-CM | POA: Diagnosis not present

## 2023-10-20 DIAGNOSIS — R278 Other lack of coordination: Secondary | ICD-10-CM | POA: Diagnosis not present

## 2023-10-21 DIAGNOSIS — R278 Other lack of coordination: Secondary | ICD-10-CM | POA: Diagnosis not present

## 2023-10-21 DIAGNOSIS — M6281 Muscle weakness (generalized): Secondary | ICD-10-CM | POA: Diagnosis not present

## 2023-10-21 DIAGNOSIS — J4489 Other specified chronic obstructive pulmonary disease: Secondary | ICD-10-CM | POA: Diagnosis not present

## 2023-10-22 DIAGNOSIS — M6281 Muscle weakness (generalized): Secondary | ICD-10-CM | POA: Diagnosis not present

## 2023-10-22 DIAGNOSIS — R278 Other lack of coordination: Secondary | ICD-10-CM | POA: Diagnosis not present

## 2023-10-22 DIAGNOSIS — J4489 Other specified chronic obstructive pulmonary disease: Secondary | ICD-10-CM | POA: Diagnosis not present

## 2023-10-23 DIAGNOSIS — M6281 Muscle weakness (generalized): Secondary | ICD-10-CM | POA: Diagnosis not present

## 2023-10-23 DIAGNOSIS — R278 Other lack of coordination: Secondary | ICD-10-CM | POA: Diagnosis not present

## 2023-10-23 DIAGNOSIS — J4489 Other specified chronic obstructive pulmonary disease: Secondary | ICD-10-CM | POA: Diagnosis not present

## 2023-10-24 DIAGNOSIS — R278 Other lack of coordination: Secondary | ICD-10-CM | POA: Diagnosis not present

## 2023-10-24 DIAGNOSIS — M6281 Muscle weakness (generalized): Secondary | ICD-10-CM | POA: Diagnosis not present

## 2023-10-24 DIAGNOSIS — J4489 Other specified chronic obstructive pulmonary disease: Secondary | ICD-10-CM | POA: Diagnosis not present

## 2023-10-26 DIAGNOSIS — J4489 Other specified chronic obstructive pulmonary disease: Secondary | ICD-10-CM | POA: Diagnosis not present

## 2023-10-26 DIAGNOSIS — R278 Other lack of coordination: Secondary | ICD-10-CM | POA: Diagnosis not present

## 2023-10-26 DIAGNOSIS — M6281 Muscle weakness (generalized): Secondary | ICD-10-CM | POA: Diagnosis not present

## 2023-10-27 DIAGNOSIS — M6281 Muscle weakness (generalized): Secondary | ICD-10-CM | POA: Diagnosis not present

## 2023-10-27 DIAGNOSIS — R278 Other lack of coordination: Secondary | ICD-10-CM | POA: Diagnosis not present

## 2023-10-27 DIAGNOSIS — J4489 Other specified chronic obstructive pulmonary disease: Secondary | ICD-10-CM | POA: Diagnosis not present

## 2023-10-28 DIAGNOSIS — R278 Other lack of coordination: Secondary | ICD-10-CM | POA: Diagnosis not present

## 2023-10-28 DIAGNOSIS — J4489 Other specified chronic obstructive pulmonary disease: Secondary | ICD-10-CM | POA: Diagnosis not present

## 2023-10-28 DIAGNOSIS — M6281 Muscle weakness (generalized): Secondary | ICD-10-CM | POA: Diagnosis not present

## 2023-10-30 DIAGNOSIS — J4489 Other specified chronic obstructive pulmonary disease: Secondary | ICD-10-CM | POA: Diagnosis not present

## 2023-10-30 DIAGNOSIS — R278 Other lack of coordination: Secondary | ICD-10-CM | POA: Diagnosis not present

## 2023-10-30 DIAGNOSIS — M6281 Muscle weakness (generalized): Secondary | ICD-10-CM | POA: Diagnosis not present

## 2023-10-31 DIAGNOSIS — R278 Other lack of coordination: Secondary | ICD-10-CM | POA: Diagnosis not present

## 2023-10-31 DIAGNOSIS — J4489 Other specified chronic obstructive pulmonary disease: Secondary | ICD-10-CM | POA: Diagnosis not present

## 2023-10-31 DIAGNOSIS — M6281 Muscle weakness (generalized): Secondary | ICD-10-CM | POA: Diagnosis not present

## 2023-11-03 DIAGNOSIS — R278 Other lack of coordination: Secondary | ICD-10-CM | POA: Diagnosis not present

## 2023-11-03 DIAGNOSIS — M6281 Muscle weakness (generalized): Secondary | ICD-10-CM | POA: Diagnosis not present

## 2023-11-03 DIAGNOSIS — J4489 Other specified chronic obstructive pulmonary disease: Secondary | ICD-10-CM | POA: Diagnosis not present

## 2023-11-04 DIAGNOSIS — R278 Other lack of coordination: Secondary | ICD-10-CM | POA: Diagnosis not present

## 2023-11-04 DIAGNOSIS — M6281 Muscle weakness (generalized): Secondary | ICD-10-CM | POA: Diagnosis not present

## 2023-11-04 DIAGNOSIS — J4489 Other specified chronic obstructive pulmonary disease: Secondary | ICD-10-CM | POA: Diagnosis not present

## 2023-11-05 DIAGNOSIS — J4489 Other specified chronic obstructive pulmonary disease: Secondary | ICD-10-CM | POA: Diagnosis not present

## 2023-11-05 DIAGNOSIS — R278 Other lack of coordination: Secondary | ICD-10-CM | POA: Diagnosis not present

## 2023-11-05 DIAGNOSIS — D649 Anemia, unspecified: Secondary | ICD-10-CM | POA: Diagnosis not present

## 2023-11-05 DIAGNOSIS — J449 Chronic obstructive pulmonary disease, unspecified: Secondary | ICD-10-CM | POA: Diagnosis not present

## 2023-11-05 DIAGNOSIS — K5791 Diverticulosis of intestine, part unspecified, without perforation or abscess with bleeding: Secondary | ICD-10-CM | POA: Diagnosis not present

## 2023-11-05 DIAGNOSIS — M6281 Muscle weakness (generalized): Secondary | ICD-10-CM | POA: Diagnosis not present

## 2023-11-06 ENCOUNTER — Other Ambulatory Visit: Payer: Medicare HMO

## 2023-11-06 DIAGNOSIS — R278 Other lack of coordination: Secondary | ICD-10-CM | POA: Diagnosis not present

## 2023-11-06 DIAGNOSIS — R972 Elevated prostate specific antigen [PSA]: Secondary | ICD-10-CM

## 2023-11-06 DIAGNOSIS — M6281 Muscle weakness (generalized): Secondary | ICD-10-CM | POA: Diagnosis not present

## 2023-11-06 DIAGNOSIS — J4489 Other specified chronic obstructive pulmonary disease: Secondary | ICD-10-CM | POA: Diagnosis not present

## 2023-11-06 DIAGNOSIS — C61 Malignant neoplasm of prostate: Secondary | ICD-10-CM | POA: Diagnosis not present

## 2023-11-07 ENCOUNTER — Ambulatory Visit: Payer: Self-pay

## 2023-11-07 DIAGNOSIS — R278 Other lack of coordination: Secondary | ICD-10-CM | POA: Diagnosis not present

## 2023-11-07 DIAGNOSIS — M6281 Muscle weakness (generalized): Secondary | ICD-10-CM | POA: Diagnosis not present

## 2023-11-07 DIAGNOSIS — J4489 Other specified chronic obstructive pulmonary disease: Secondary | ICD-10-CM | POA: Diagnosis not present

## 2023-11-07 LAB — PSA: Prostate Specific Ag, Serum: 6.4 ng/mL — ABNORMAL HIGH (ref 0.0–4.0)

## 2023-11-10 DIAGNOSIS — R278 Other lack of coordination: Secondary | ICD-10-CM | POA: Diagnosis not present

## 2023-11-10 DIAGNOSIS — M6281 Muscle weakness (generalized): Secondary | ICD-10-CM | POA: Diagnosis not present

## 2023-11-10 DIAGNOSIS — J4489 Other specified chronic obstructive pulmonary disease: Secondary | ICD-10-CM | POA: Diagnosis not present

## 2023-11-10 NOTE — Telephone Encounter (Signed)
 Called [facility] to reach out to nurse/patient regarding follow up appointment. Wait for about 10 minutes for nurse with no answer

## 2023-11-11 DIAGNOSIS — J4489 Other specified chronic obstructive pulmonary disease: Secondary | ICD-10-CM | POA: Diagnosis not present

## 2023-11-11 DIAGNOSIS — M6281 Muscle weakness (generalized): Secondary | ICD-10-CM | POA: Diagnosis not present

## 2023-11-11 DIAGNOSIS — R278 Other lack of coordination: Secondary | ICD-10-CM | POA: Diagnosis not present

## 2023-11-12 DIAGNOSIS — J4489 Other specified chronic obstructive pulmonary disease: Secondary | ICD-10-CM | POA: Diagnosis not present

## 2023-11-12 DIAGNOSIS — R278 Other lack of coordination: Secondary | ICD-10-CM | POA: Diagnosis not present

## 2023-11-12 DIAGNOSIS — M6281 Muscle weakness (generalized): Secondary | ICD-10-CM | POA: Diagnosis not present

## 2023-11-13 ENCOUNTER — Ambulatory Visit: Payer: Medicare HMO | Admitting: Urology

## 2023-11-13 DIAGNOSIS — M6281 Muscle weakness (generalized): Secondary | ICD-10-CM | POA: Diagnosis not present

## 2023-11-13 DIAGNOSIS — R278 Other lack of coordination: Secondary | ICD-10-CM | POA: Diagnosis not present

## 2023-11-13 DIAGNOSIS — J4489 Other specified chronic obstructive pulmonary disease: Secondary | ICD-10-CM | POA: Diagnosis not present

## 2023-11-14 DIAGNOSIS — J4489 Other specified chronic obstructive pulmonary disease: Secondary | ICD-10-CM | POA: Diagnosis not present

## 2023-11-14 DIAGNOSIS — R278 Other lack of coordination: Secondary | ICD-10-CM | POA: Diagnosis not present

## 2023-11-14 DIAGNOSIS — M6281 Muscle weakness (generalized): Secondary | ICD-10-CM | POA: Diagnosis not present

## 2023-11-15 DIAGNOSIS — J4489 Other specified chronic obstructive pulmonary disease: Secondary | ICD-10-CM | POA: Diagnosis not present

## 2023-11-15 DIAGNOSIS — R278 Other lack of coordination: Secondary | ICD-10-CM | POA: Diagnosis not present

## 2023-11-15 DIAGNOSIS — M6281 Muscle weakness (generalized): Secondary | ICD-10-CM | POA: Diagnosis not present

## 2023-11-18 DIAGNOSIS — J4489 Other specified chronic obstructive pulmonary disease: Secondary | ICD-10-CM | POA: Diagnosis not present

## 2023-11-18 DIAGNOSIS — R278 Other lack of coordination: Secondary | ICD-10-CM | POA: Diagnosis not present

## 2023-11-18 DIAGNOSIS — M6281 Muscle weakness (generalized): Secondary | ICD-10-CM | POA: Diagnosis not present

## 2023-11-19 DIAGNOSIS — M6281 Muscle weakness (generalized): Secondary | ICD-10-CM | POA: Diagnosis not present

## 2023-11-19 DIAGNOSIS — R278 Other lack of coordination: Secondary | ICD-10-CM | POA: Diagnosis not present

## 2023-11-19 DIAGNOSIS — J449 Chronic obstructive pulmonary disease, unspecified: Secondary | ICD-10-CM | POA: Diagnosis not present

## 2023-11-19 DIAGNOSIS — J4489 Other specified chronic obstructive pulmonary disease: Secondary | ICD-10-CM | POA: Diagnosis not present

## 2023-11-19 DIAGNOSIS — M1712 Unilateral primary osteoarthritis, left knee: Secondary | ICD-10-CM | POA: Diagnosis not present

## 2023-11-20 DIAGNOSIS — M6281 Muscle weakness (generalized): Secondary | ICD-10-CM | POA: Diagnosis not present

## 2023-11-20 DIAGNOSIS — J4489 Other specified chronic obstructive pulmonary disease: Secondary | ICD-10-CM | POA: Diagnosis not present

## 2023-11-20 DIAGNOSIS — R278 Other lack of coordination: Secondary | ICD-10-CM | POA: Diagnosis not present

## 2023-11-21 DIAGNOSIS — R278 Other lack of coordination: Secondary | ICD-10-CM | POA: Diagnosis not present

## 2023-11-21 DIAGNOSIS — Z7409 Other reduced mobility: Secondary | ICD-10-CM | POA: Diagnosis not present

## 2023-11-21 DIAGNOSIS — E559 Vitamin D deficiency, unspecified: Secondary | ICD-10-CM | POA: Diagnosis not present

## 2023-11-21 DIAGNOSIS — J4489 Other specified chronic obstructive pulmonary disease: Secondary | ICD-10-CM | POA: Diagnosis not present

## 2023-11-21 DIAGNOSIS — M170 Bilateral primary osteoarthritis of knee: Secondary | ICD-10-CM | POA: Diagnosis not present

## 2023-11-21 DIAGNOSIS — M6281 Muscle weakness (generalized): Secondary | ICD-10-CM | POA: Diagnosis not present

## 2023-11-21 DIAGNOSIS — J449 Chronic obstructive pulmonary disease, unspecified: Secondary | ICD-10-CM | POA: Diagnosis not present

## 2023-11-24 DIAGNOSIS — R278 Other lack of coordination: Secondary | ICD-10-CM | POA: Diagnosis not present

## 2023-11-24 DIAGNOSIS — M6281 Muscle weakness (generalized): Secondary | ICD-10-CM | POA: Diagnosis not present

## 2023-11-24 DIAGNOSIS — J4489 Other specified chronic obstructive pulmonary disease: Secondary | ICD-10-CM | POA: Diagnosis not present

## 2023-11-26 DIAGNOSIS — M6281 Muscle weakness (generalized): Secondary | ICD-10-CM | POA: Diagnosis not present

## 2023-11-26 DIAGNOSIS — J4489 Other specified chronic obstructive pulmonary disease: Secondary | ICD-10-CM | POA: Diagnosis not present

## 2023-11-26 DIAGNOSIS — R278 Other lack of coordination: Secondary | ICD-10-CM | POA: Diagnosis not present

## 2023-11-27 DIAGNOSIS — R278 Other lack of coordination: Secondary | ICD-10-CM | POA: Diagnosis not present

## 2023-11-27 DIAGNOSIS — M6281 Muscle weakness (generalized): Secondary | ICD-10-CM | POA: Diagnosis not present

## 2023-11-27 DIAGNOSIS — J4489 Other specified chronic obstructive pulmonary disease: Secondary | ICD-10-CM | POA: Diagnosis not present

## 2023-11-28 DIAGNOSIS — J4489 Other specified chronic obstructive pulmonary disease: Secondary | ICD-10-CM | POA: Diagnosis not present

## 2023-11-28 DIAGNOSIS — E559 Vitamin D deficiency, unspecified: Secondary | ICD-10-CM | POA: Diagnosis not present

## 2023-11-28 DIAGNOSIS — J449 Chronic obstructive pulmonary disease, unspecified: Secondary | ICD-10-CM | POA: Diagnosis not present

## 2023-11-28 DIAGNOSIS — Z7409 Other reduced mobility: Secondary | ICD-10-CM | POA: Diagnosis not present

## 2023-11-28 DIAGNOSIS — R278 Other lack of coordination: Secondary | ICD-10-CM | POA: Diagnosis not present

## 2023-11-28 DIAGNOSIS — M170 Bilateral primary osteoarthritis of knee: Secondary | ICD-10-CM | POA: Diagnosis not present

## 2023-11-28 DIAGNOSIS — M6281 Muscle weakness (generalized): Secondary | ICD-10-CM | POA: Diagnosis not present

## 2023-11-30 DIAGNOSIS — J4489 Other specified chronic obstructive pulmonary disease: Secondary | ICD-10-CM | POA: Diagnosis not present

## 2023-11-30 DIAGNOSIS — M6281 Muscle weakness (generalized): Secondary | ICD-10-CM | POA: Diagnosis not present

## 2023-11-30 DIAGNOSIS — R278 Other lack of coordination: Secondary | ICD-10-CM | POA: Diagnosis not present

## 2023-12-01 DIAGNOSIS — R278 Other lack of coordination: Secondary | ICD-10-CM | POA: Diagnosis not present

## 2023-12-01 DIAGNOSIS — J4489 Other specified chronic obstructive pulmonary disease: Secondary | ICD-10-CM | POA: Diagnosis not present

## 2023-12-01 DIAGNOSIS — M6281 Muscle weakness (generalized): Secondary | ICD-10-CM | POA: Diagnosis not present

## 2023-12-02 DIAGNOSIS — J4489 Other specified chronic obstructive pulmonary disease: Secondary | ICD-10-CM | POA: Diagnosis not present

## 2023-12-02 DIAGNOSIS — R278 Other lack of coordination: Secondary | ICD-10-CM | POA: Diagnosis not present

## 2023-12-02 DIAGNOSIS — M6281 Muscle weakness (generalized): Secondary | ICD-10-CM | POA: Diagnosis not present

## 2023-12-03 DIAGNOSIS — R278 Other lack of coordination: Secondary | ICD-10-CM | POA: Diagnosis not present

## 2023-12-03 DIAGNOSIS — E559 Vitamin D deficiency, unspecified: Secondary | ICD-10-CM | POA: Diagnosis not present

## 2023-12-03 DIAGNOSIS — J449 Chronic obstructive pulmonary disease, unspecified: Secondary | ICD-10-CM | POA: Diagnosis not present

## 2023-12-03 DIAGNOSIS — M19042 Primary osteoarthritis, left hand: Secondary | ICD-10-CM | POA: Diagnosis not present

## 2023-12-03 DIAGNOSIS — M6281 Muscle weakness (generalized): Secondary | ICD-10-CM | POA: Diagnosis not present

## 2023-12-03 DIAGNOSIS — J4489 Other specified chronic obstructive pulmonary disease: Secondary | ICD-10-CM | POA: Diagnosis not present

## 2023-12-05 DIAGNOSIS — J4489 Other specified chronic obstructive pulmonary disease: Secondary | ICD-10-CM | POA: Diagnosis not present

## 2023-12-05 DIAGNOSIS — M6281 Muscle weakness (generalized): Secondary | ICD-10-CM | POA: Diagnosis not present

## 2023-12-05 DIAGNOSIS — R278 Other lack of coordination: Secondary | ICD-10-CM | POA: Diagnosis not present

## 2023-12-08 DIAGNOSIS — M6281 Muscle weakness (generalized): Secondary | ICD-10-CM | POA: Diagnosis not present

## 2023-12-08 DIAGNOSIS — J4489 Other specified chronic obstructive pulmonary disease: Secondary | ICD-10-CM | POA: Diagnosis not present

## 2023-12-08 DIAGNOSIS — R278 Other lack of coordination: Secondary | ICD-10-CM | POA: Diagnosis not present

## 2023-12-09 DIAGNOSIS — R278 Other lack of coordination: Secondary | ICD-10-CM | POA: Diagnosis not present

## 2023-12-09 DIAGNOSIS — M6281 Muscle weakness (generalized): Secondary | ICD-10-CM | POA: Diagnosis not present

## 2023-12-09 DIAGNOSIS — J4489 Other specified chronic obstructive pulmonary disease: Secondary | ICD-10-CM | POA: Diagnosis not present

## 2023-12-10 DIAGNOSIS — I10 Essential (primary) hypertension: Secondary | ICD-10-CM | POA: Diagnosis not present

## 2023-12-10 DIAGNOSIS — J4489 Other specified chronic obstructive pulmonary disease: Secondary | ICD-10-CM | POA: Diagnosis not present

## 2023-12-10 DIAGNOSIS — J449 Chronic obstructive pulmonary disease, unspecified: Secondary | ICD-10-CM | POA: Diagnosis not present

## 2023-12-10 DIAGNOSIS — E559 Vitamin D deficiency, unspecified: Secondary | ICD-10-CM | POA: Diagnosis not present

## 2023-12-10 DIAGNOSIS — M6281 Muscle weakness (generalized): Secondary | ICD-10-CM | POA: Diagnosis not present

## 2023-12-10 DIAGNOSIS — R278 Other lack of coordination: Secondary | ICD-10-CM | POA: Diagnosis not present

## 2023-12-10 DIAGNOSIS — D649 Anemia, unspecified: Secondary | ICD-10-CM | POA: Diagnosis not present

## 2023-12-11 DIAGNOSIS — R278 Other lack of coordination: Secondary | ICD-10-CM | POA: Diagnosis not present

## 2023-12-11 DIAGNOSIS — M6281 Muscle weakness (generalized): Secondary | ICD-10-CM | POA: Diagnosis not present

## 2023-12-11 DIAGNOSIS — J4489 Other specified chronic obstructive pulmonary disease: Secondary | ICD-10-CM | POA: Diagnosis not present

## 2023-12-12 DIAGNOSIS — J4489 Other specified chronic obstructive pulmonary disease: Secondary | ICD-10-CM | POA: Diagnosis not present

## 2023-12-12 DIAGNOSIS — M6281 Muscle weakness (generalized): Secondary | ICD-10-CM | POA: Diagnosis not present

## 2023-12-12 DIAGNOSIS — R278 Other lack of coordination: Secondary | ICD-10-CM | POA: Diagnosis not present

## 2023-12-15 DIAGNOSIS — R278 Other lack of coordination: Secondary | ICD-10-CM | POA: Diagnosis not present

## 2023-12-15 DIAGNOSIS — J4489 Other specified chronic obstructive pulmonary disease: Secondary | ICD-10-CM | POA: Diagnosis not present

## 2023-12-15 DIAGNOSIS — M6281 Muscle weakness (generalized): Secondary | ICD-10-CM | POA: Diagnosis not present

## 2023-12-16 DIAGNOSIS — E559 Vitamin D deficiency, unspecified: Secondary | ICD-10-CM | POA: Diagnosis not present

## 2023-12-16 DIAGNOSIS — J4489 Other specified chronic obstructive pulmonary disease: Secondary | ICD-10-CM | POA: Diagnosis not present

## 2023-12-16 DIAGNOSIS — M6281 Muscle weakness (generalized): Secondary | ICD-10-CM | POA: Diagnosis not present

## 2023-12-16 DIAGNOSIS — M170 Bilateral primary osteoarthritis of knee: Secondary | ICD-10-CM | POA: Diagnosis not present

## 2023-12-16 DIAGNOSIS — R278 Other lack of coordination: Secondary | ICD-10-CM | POA: Diagnosis not present

## 2023-12-16 DIAGNOSIS — Z7409 Other reduced mobility: Secondary | ICD-10-CM | POA: Diagnosis not present

## 2023-12-16 DIAGNOSIS — J449 Chronic obstructive pulmonary disease, unspecified: Secondary | ICD-10-CM | POA: Diagnosis not present

## 2023-12-17 DIAGNOSIS — M6281 Muscle weakness (generalized): Secondary | ICD-10-CM | POA: Diagnosis not present

## 2023-12-17 DIAGNOSIS — J4489 Other specified chronic obstructive pulmonary disease: Secondary | ICD-10-CM | POA: Diagnosis not present

## 2023-12-17 DIAGNOSIS — R278 Other lack of coordination: Secondary | ICD-10-CM | POA: Diagnosis not present

## 2023-12-18 DIAGNOSIS — M6281 Muscle weakness (generalized): Secondary | ICD-10-CM | POA: Diagnosis not present

## 2023-12-18 DIAGNOSIS — J4489 Other specified chronic obstructive pulmonary disease: Secondary | ICD-10-CM | POA: Diagnosis not present

## 2023-12-18 DIAGNOSIS — R278 Other lack of coordination: Secondary | ICD-10-CM | POA: Diagnosis not present

## 2023-12-19 DIAGNOSIS — R278 Other lack of coordination: Secondary | ICD-10-CM | POA: Diagnosis not present

## 2023-12-19 DIAGNOSIS — J4489 Other specified chronic obstructive pulmonary disease: Secondary | ICD-10-CM | POA: Diagnosis not present

## 2023-12-19 DIAGNOSIS — M6281 Muscle weakness (generalized): Secondary | ICD-10-CM | POA: Diagnosis not present

## 2023-12-22 DIAGNOSIS — M6281 Muscle weakness (generalized): Secondary | ICD-10-CM | POA: Diagnosis not present

## 2023-12-22 DIAGNOSIS — R278 Other lack of coordination: Secondary | ICD-10-CM | POA: Diagnosis not present

## 2023-12-22 DIAGNOSIS — J4489 Other specified chronic obstructive pulmonary disease: Secondary | ICD-10-CM | POA: Diagnosis not present

## 2023-12-23 DIAGNOSIS — Z961 Presence of intraocular lens: Secondary | ICD-10-CM | POA: Diagnosis not present

## 2023-12-23 DIAGNOSIS — M6281 Muscle weakness (generalized): Secondary | ICD-10-CM | POA: Diagnosis not present

## 2023-12-23 DIAGNOSIS — H47233 Glaucomatous optic atrophy, bilateral: Secondary | ICD-10-CM | POA: Diagnosis not present

## 2023-12-23 DIAGNOSIS — R278 Other lack of coordination: Secondary | ICD-10-CM | POA: Diagnosis not present

## 2023-12-23 DIAGNOSIS — J4489 Other specified chronic obstructive pulmonary disease: Secondary | ICD-10-CM | POA: Diagnosis not present

## 2023-12-23 DIAGNOSIS — H524 Presbyopia: Secondary | ICD-10-CM | POA: Diagnosis not present

## 2023-12-24 DIAGNOSIS — R278 Other lack of coordination: Secondary | ICD-10-CM | POA: Diagnosis not present

## 2023-12-25 DIAGNOSIS — R278 Other lack of coordination: Secondary | ICD-10-CM | POA: Diagnosis not present

## 2023-12-26 DIAGNOSIS — R278 Other lack of coordination: Secondary | ICD-10-CM | POA: Diagnosis not present

## 2023-12-29 DIAGNOSIS — M6281 Muscle weakness (generalized): Secondary | ICD-10-CM | POA: Diagnosis not present

## 2023-12-29 DIAGNOSIS — R278 Other lack of coordination: Secondary | ICD-10-CM | POA: Diagnosis not present

## 2023-12-30 DIAGNOSIS — R278 Other lack of coordination: Secondary | ICD-10-CM | POA: Diagnosis not present

## 2023-12-31 DIAGNOSIS — R278 Other lack of coordination: Secondary | ICD-10-CM | POA: Diagnosis not present

## 2024-01-01 DIAGNOSIS — R278 Other lack of coordination: Secondary | ICD-10-CM | POA: Diagnosis not present

## 2024-01-02 DIAGNOSIS — R278 Other lack of coordination: Secondary | ICD-10-CM | POA: Diagnosis not present

## 2024-01-06 DIAGNOSIS — R278 Other lack of coordination: Secondary | ICD-10-CM | POA: Diagnosis not present

## 2024-01-07 DIAGNOSIS — R278 Other lack of coordination: Secondary | ICD-10-CM | POA: Diagnosis not present

## 2024-01-08 DIAGNOSIS — R278 Other lack of coordination: Secondary | ICD-10-CM | POA: Diagnosis not present

## 2024-01-08 DIAGNOSIS — M6281 Muscle weakness (generalized): Secondary | ICD-10-CM | POA: Diagnosis not present

## 2024-01-09 DIAGNOSIS — R278 Other lack of coordination: Secondary | ICD-10-CM | POA: Diagnosis not present

## 2024-01-10 DIAGNOSIS — R278 Other lack of coordination: Secondary | ICD-10-CM | POA: Diagnosis not present

## 2024-01-12 DIAGNOSIS — R278 Other lack of coordination: Secondary | ICD-10-CM | POA: Diagnosis not present

## 2024-01-13 DIAGNOSIS — R278 Other lack of coordination: Secondary | ICD-10-CM | POA: Diagnosis not present

## 2024-01-14 DIAGNOSIS — R278 Other lack of coordination: Secondary | ICD-10-CM | POA: Diagnosis not present

## 2024-01-20 DIAGNOSIS — R278 Other lack of coordination: Secondary | ICD-10-CM | POA: Diagnosis not present

## 2024-01-21 DIAGNOSIS — R278 Other lack of coordination: Secondary | ICD-10-CM | POA: Diagnosis not present

## 2024-01-22 DIAGNOSIS — R278 Other lack of coordination: Secondary | ICD-10-CM | POA: Diagnosis not present

## 2024-01-23 DIAGNOSIS — R278 Other lack of coordination: Secondary | ICD-10-CM | POA: Diagnosis not present

## 2024-01-26 DIAGNOSIS — R262 Difficulty in walking, not elsewhere classified: Secondary | ICD-10-CM | POA: Diagnosis not present

## 2024-01-26 DIAGNOSIS — R278 Other lack of coordination: Secondary | ICD-10-CM | POA: Diagnosis not present

## 2024-01-27 DIAGNOSIS — R262 Difficulty in walking, not elsewhere classified: Secondary | ICD-10-CM | POA: Diagnosis not present

## 2024-01-27 DIAGNOSIS — R278 Other lack of coordination: Secondary | ICD-10-CM | POA: Diagnosis not present

## 2024-01-28 DIAGNOSIS — R262 Difficulty in walking, not elsewhere classified: Secondary | ICD-10-CM | POA: Diagnosis not present

## 2024-01-28 DIAGNOSIS — R278 Other lack of coordination: Secondary | ICD-10-CM | POA: Diagnosis not present

## 2024-01-29 DIAGNOSIS — R262 Difficulty in walking, not elsewhere classified: Secondary | ICD-10-CM | POA: Diagnosis not present

## 2024-01-29 DIAGNOSIS — R278 Other lack of coordination: Secondary | ICD-10-CM | POA: Diagnosis not present

## 2024-01-30 DIAGNOSIS — R278 Other lack of coordination: Secondary | ICD-10-CM | POA: Diagnosis not present

## 2024-01-30 DIAGNOSIS — R262 Difficulty in walking, not elsewhere classified: Secondary | ICD-10-CM | POA: Diagnosis not present

## 2024-02-02 DIAGNOSIS — R278 Other lack of coordination: Secondary | ICD-10-CM | POA: Diagnosis not present

## 2024-02-02 DIAGNOSIS — R262 Difficulty in walking, not elsewhere classified: Secondary | ICD-10-CM | POA: Diagnosis not present

## 2024-02-03 DIAGNOSIS — R278 Other lack of coordination: Secondary | ICD-10-CM | POA: Diagnosis not present

## 2024-02-03 DIAGNOSIS — R262 Difficulty in walking, not elsewhere classified: Secondary | ICD-10-CM | POA: Diagnosis not present

## 2024-02-04 DIAGNOSIS — R278 Other lack of coordination: Secondary | ICD-10-CM | POA: Diagnosis not present

## 2024-02-04 DIAGNOSIS — R262 Difficulty in walking, not elsewhere classified: Secondary | ICD-10-CM | POA: Diagnosis not present

## 2024-02-05 DIAGNOSIS — R262 Difficulty in walking, not elsewhere classified: Secondary | ICD-10-CM | POA: Diagnosis not present

## 2024-02-05 DIAGNOSIS — R278 Other lack of coordination: Secondary | ICD-10-CM | POA: Diagnosis not present

## 2024-02-06 DIAGNOSIS — M6281 Muscle weakness (generalized): Secondary | ICD-10-CM | POA: Diagnosis not present

## 2024-02-06 DIAGNOSIS — R262 Difficulty in walking, not elsewhere classified: Secondary | ICD-10-CM | POA: Diagnosis not present

## 2024-02-06 DIAGNOSIS — R278 Other lack of coordination: Secondary | ICD-10-CM | POA: Diagnosis not present

## 2024-02-09 DIAGNOSIS — R278 Other lack of coordination: Secondary | ICD-10-CM | POA: Diagnosis not present

## 2024-02-09 DIAGNOSIS — R262 Difficulty in walking, not elsewhere classified: Secondary | ICD-10-CM | POA: Diagnosis not present

## 2024-02-09 DIAGNOSIS — M6281 Muscle weakness (generalized): Secondary | ICD-10-CM | POA: Diagnosis not present

## 2024-02-10 DIAGNOSIS — R262 Difficulty in walking, not elsewhere classified: Secondary | ICD-10-CM | POA: Diagnosis not present

## 2024-02-10 DIAGNOSIS — R278 Other lack of coordination: Secondary | ICD-10-CM | POA: Diagnosis not present

## 2024-02-11 DIAGNOSIS — R278 Other lack of coordination: Secondary | ICD-10-CM | POA: Diagnosis not present

## 2024-02-11 DIAGNOSIS — M6281 Muscle weakness (generalized): Secondary | ICD-10-CM | POA: Diagnosis not present

## 2024-02-11 DIAGNOSIS — R262 Difficulty in walking, not elsewhere classified: Secondary | ICD-10-CM | POA: Diagnosis not present

## 2024-02-12 DIAGNOSIS — R278 Other lack of coordination: Secondary | ICD-10-CM | POA: Diagnosis not present

## 2024-02-12 DIAGNOSIS — R262 Difficulty in walking, not elsewhere classified: Secondary | ICD-10-CM | POA: Diagnosis not present

## 2024-02-13 ENCOUNTER — Ambulatory Visit: Admitting: Orthopedic Surgery

## 2024-02-13 ENCOUNTER — Other Ambulatory Visit: Payer: Self-pay

## 2024-02-13 ENCOUNTER — Encounter: Payer: Self-pay | Admitting: Orthopedic Surgery

## 2024-02-13 DIAGNOSIS — Z96652 Presence of left artificial knee joint: Secondary | ICD-10-CM

## 2024-02-13 DIAGNOSIS — M25561 Pain in right knee: Secondary | ICD-10-CM

## 2024-02-13 DIAGNOSIS — G8929 Other chronic pain: Secondary | ICD-10-CM | POA: Diagnosis not present

## 2024-02-13 DIAGNOSIS — R278 Other lack of coordination: Secondary | ICD-10-CM | POA: Diagnosis not present

## 2024-02-13 DIAGNOSIS — M25562 Pain in left knee: Secondary | ICD-10-CM

## 2024-02-13 DIAGNOSIS — R262 Difficulty in walking, not elsewhere classified: Secondary | ICD-10-CM | POA: Diagnosis not present

## 2024-02-13 NOTE — Progress Notes (Unsigned)
 New Patient Visit  Assessment: Randy Russo is a 83 y.o. male with the following: 1. Chronic pain of right knee 2. Chronic pain of left knee 3. History of left knee replacement  Plan: [name] has pain in both knees.  History of L TKA in 2013 with Dr. Ernie.  No obvious signs of an infection.  Recommend follow up with Dr. Ernie.   Right knee with pain and valgus deformity.  He is lacking motion.  Was reportedly offered knee replacement but declined.  Most likely has bad OA, but we were unable to get updated XR today.  Offered injection for right knee.   Procedure note injection Right knee joint   Verbal consent was obtained to inject the right knee joint  Timeout was completed to confirm the site of injection.  The skin was prepped with alcohol and ethyl chloride was sprayed at the injection site.  A 21-gauge needle was used to inject 40 mg of Depo-Medrol  and 1% lidocaine  (4 cc) into the right knee using an anterolateral approach.  There were no complications. A sterile bandage was applied.   Follow-up: Return if symptoms worsen or fail to improve.  Subjective:  Chief Complaint  Patient presents with   Knee Pain    Both / unable to xray today, he almost fell in the xray room  NDC: [phone]    History of Present Illness: Randy Russo is a 83 y.o. male who presents for evaluation of B knee pain.  Chronic pain of right knee.  No injury.  He was offered TKA ,but was unable to get procedure. History of left TKA, completed by Dr. Ernie.  No issues.  Recently started to have worsening pain.  Progressive.  Pain in left knee for the past several months.  Chronic pain and deformity of right knee   Review of Systems: No fevers or chills No numbness or tingling No chest pain No shortness of breath No bowel or bladder dysfunction No GI distress No headaches   Medical History:  Past Medical History:  Diagnosis Date   Arthritis    Cancer (HCC)    Prostate   COPD  (chronic obstructive pulmonary disease) (HCC)    Diverticulitis    Falls frequently    Hypertension    Lower GI bleed 12/27/2017    Past Surgical History:  Procedure Laterality Date   CATARACT EXTRACTION W/PHACO Left 11/16/2012   Procedure: CATARACT EXTRACTION PHACO AND INTRAOCULAR LENS PLACEMENT (IOC);  Surgeon: Cherene Mania, MD;  Location: [facility];  Service: Ophthalmology;  Laterality: Left;  CDE: 13.66   CATARACT EXTRACTION W/PHACO Right 01/18/2013   Procedure: CATARACT EXTRACTION PHACO AND INTRAOCULAR LENS PLACEMENT (IOC);  Surgeon: Cherene Mania, MD;  Location: [facility];  Service: Ophthalmology;  Laterality: Right;  CDE:8.79   CERVICAL FUSION  4 yrs ago   COLONOSCOPY N/A 12/28/2017   Procedure: COLONOSCOPY;  Surgeon: Golda Claudis PENNER, MD;  [facility]: [facility];  Service: Endoscopy;  Laterality: N/A;   COLONOSCOPY N/A 01/01/2018   Procedure: COLONOSCOPY;  Surgeon: Golda Claudis PENNER, MD;  [facility];  Service: Endoscopy;  Laterality: N/A;   ESOPHAGOGASTRODUODENOSCOPY N/A 01/01/2018   Procedure: ESOPHAGOGASTRODUODENOSCOPY (EGD);  Surgeon: Golda Claudis PENNER, MD;  [facility];  Service: Endoscopy;  Laterality: N/A;   INGUINAL HERNIA REPAIR Right 03/12/2021   Procedure: HERNIA REPAIR INGUINAL ADULT;  Surgeon: Mavis Anes, MD;  Location: [facility];  Service: General;  Laterality: Right;   JOINT REPLACEMENT Left  5 yrs ago   knee   PARTIAL COLECTOMY N/A 01/01/2018   Procedure: PARTIAL COLECTOMY;  Surgeon: Mavis Anes, MD;  Location: [facility];  Service: General;  Laterality: N/A;   right hip replacement Right 3 yrs ago    Family History  Problem Relation Age of Onset   Diabetes Mother    Dementia Father    Social History   Tobacco Use   Smoking status: Former    Types: Cigars   Smokeless tobacco: Never  Vaping Use   Vaping status: Never Used  Substance Use Topics   Alcohol use: No   Drug use: No    No Known Allergies  Current Meds  Medication Sig    acetaminophen  (TYLENOL ) 325 MG tablet Take 650 mg by mouth every 6 (six) hours as needed for mild pain (pain score 1-3). Do not exceed 3,000 mg in 24 hours   albuterol  (PROVENTIL ) (2.5 MG/3ML) 0.083% nebulizer solution Take 3 mLs (2.5 mg total) by nebulization every 6 (six) hours as needed for wheezing or shortness of breath.   ascorbic acid  (VITAMIN C ) 500 MG tablet Take 1 tablet (500 mg total) by mouth daily.   B Complex-C (B-COMPLEX WITH VITAMIN C ) tablet Take 1 tablet by mouth daily.   Cholecalciferol  25 MCG (1000 UT) capsule Take 1 capsule (1,000 Units total) by mouth daily.   docusate sodium  (COLACE) 100 MG capsule Take 2 capsules (200 mg total) by mouth daily.   ferrous sulfate  325 (65 FE) MG tablet Take 1 tablet (325 mg total) by mouth 2 (two) times daily with a meal.   furosemide  (LASIX ) 20 MG tablet Take 20 mg by mouth daily.   latanoprost  (XALATAN ) 0.005 % ophthalmic solution Place 1 drop into both eyes at bedtime.   loratadine  (CLARITIN ) 10 MG tablet Take 1 tablet (10 mg total) by mouth daily.   mupirocin  ointment (BACTROBAN ) 2 % Apply 1 Application topically daily.   Nutritional Supplements (NUTRITIONAL SUPPLEMENT PLUS) LIQD Take 120 mLs by mouth daily.   SPIRIVA  RESPIMAT 2.5 MCG/ACT AERS Inhale 2 puffs into the lungs daily.    Objective: There were no vitals taken for this visit.  Physical Exam:  General: Elderly male., Alert and oriented., and No acute distress. Gait: Slow, waddling gait.  Valgus deformity of right knee.  25-100.  Tenderness medial and lateral joint line.    Left knee with well healed surgical incision.  No redness.  No swelling.  No obvious instability.   IMAGING: No new imaging obtained today   New Medications:  No orders of the defined types were placed in this encounter.     Anes DELENA Horde, MD  02/14/2024 10:26 PM

## 2024-02-13 NOTE — Patient Instructions (Addendum)
 Instructions Following Joint Injections  In clinic today, you received an injection in one of your joints (sometimes more than one).  Occasionally, you can have some pain at the injection site, this is normal.  You can place ice at the injection site, or take over-the-counter medications such as Tylenol  (acetaminophen ) or Advil  (ibuprofen ).  Please follow all directions listed on the bottle.  If your joint (knee or shoulder) becomes swollen, red or very painful, please contact the clinic for additional assistance.   Two medications were injected, including lidocaine  and a steroid (often referred to as cortisone).  Lidocaine  is effective almost immediately but wears off quickly.  However, the steroid can take a few days to improve your symptoms.  In some cases, it can make your pain worse for a couple of days.  Do not be concerned if this happens as it is common.  You can apply ice or take some over-the-counter medications as needed.   Injections in the same joint cannot be repeated for 3 months.  This helps to limit the risk of an infection in the joint.  If you were to develop an infection in your joint, the best treatment option would be surgery.    Follow up with Dr. Ernie for repeat evaluation of the left knee; history of left knee replacement with Dr. Ernie in 2012

## 2024-02-13 NOTE — Progress Notes (Unsigned)
 Unable to xray today he almost fell in xray and can not stand or transfer to table for xrays

## 2024-02-16 DIAGNOSIS — R262 Difficulty in walking, not elsewhere classified: Secondary | ICD-10-CM | POA: Diagnosis not present

## 2024-02-16 DIAGNOSIS — R278 Other lack of coordination: Secondary | ICD-10-CM | POA: Diagnosis not present

## 2024-02-17 DIAGNOSIS — R278 Other lack of coordination: Secondary | ICD-10-CM | POA: Diagnosis not present

## 2024-02-17 DIAGNOSIS — R262 Difficulty in walking, not elsewhere classified: Secondary | ICD-10-CM | POA: Diagnosis not present

## 2024-02-18 DIAGNOSIS — R262 Difficulty in walking, not elsewhere classified: Secondary | ICD-10-CM | POA: Diagnosis not present

## 2024-02-18 DIAGNOSIS — R278 Other lack of coordination: Secondary | ICD-10-CM | POA: Diagnosis not present

## 2024-02-19 DIAGNOSIS — M6281 Muscle weakness (generalized): Secondary | ICD-10-CM | POA: Diagnosis not present

## 2024-02-19 DIAGNOSIS — R262 Difficulty in walking, not elsewhere classified: Secondary | ICD-10-CM | POA: Diagnosis not present

## 2024-02-19 DIAGNOSIS — R278 Other lack of coordination: Secondary | ICD-10-CM | POA: Diagnosis not present

## 2024-03-25 ENCOUNTER — Encounter: Payer: Self-pay | Admitting: Gastroenterology
